# Patient Record
Sex: Male | Born: 1955
Health system: Southern US, Community
[De-identification: ages and names within clinical notes are randomized; demographics above are authoritative.]

## PROBLEM LIST (undated history)

## (undated) DIAGNOSIS — L03116 Cellulitis of left lower limb: Secondary | ICD-10-CM

## (undated) DIAGNOSIS — E119 Type 2 diabetes mellitus without complications: Secondary | ICD-10-CM

## (undated) DIAGNOSIS — L02416 Cutaneous abscess of left lower limb: Secondary | ICD-10-CM

## (undated) DIAGNOSIS — I509 Heart failure, unspecified: Secondary | ICD-10-CM

## (undated) DIAGNOSIS — I4891 Unspecified atrial fibrillation: Secondary | ICD-10-CM

## (undated) DIAGNOSIS — R0600 Dyspnea, unspecified: Secondary | ICD-10-CM

## (undated) HISTORY — PX: OTHER SURGICAL HISTORY: SHX169

---

## 2016-06-12 DIAGNOSIS — L02416 Cutaneous abscess of left lower limb: Secondary | ICD-10-CM

## 2016-06-12 DIAGNOSIS — L03116 Cellulitis of left lower limb: Secondary | ICD-10-CM

## 2016-06-12 HISTORY — DX: Cellulitis of left lower limb: L03.116

## 2016-06-12 HISTORY — DX: Cutaneous abscess of left lower limb: L02.416

## 2016-06-22 ENCOUNTER — Inpatient Hospital Stay (HOSPITAL_COMMUNITY): Payer: Self-pay

## 2016-06-22 ENCOUNTER — Inpatient Hospital Stay (HOSPITAL_COMMUNITY)
Admission: EM | Admit: 2016-06-22 | Discharge: 2016-06-27 | DRG: 617 | Disposition: A | Discharge status: Home / Self Care | Payer: Self-pay | Attending: Internal Medicine | Admitting: Internal Medicine

## 2016-06-22 ENCOUNTER — Encounter (HOSPITAL_COMMUNITY): Payer: Self-pay | Admitting: Emergency Medicine

## 2016-06-22 ENCOUNTER — Emergency Department (HOSPITAL_COMMUNITY): Payer: Self-pay

## 2016-06-22 DIAGNOSIS — E1152 Type 2 diabetes mellitus with diabetic peripheral angiopathy with gangrene: Secondary | ICD-10-CM | POA: Diagnosis present

## 2016-06-22 DIAGNOSIS — E11621 Type 2 diabetes mellitus with foot ulcer: Secondary | ICD-10-CM | POA: Diagnosis present

## 2016-06-22 DIAGNOSIS — Z9889 Other specified postprocedural states: Secondary | ICD-10-CM

## 2016-06-22 DIAGNOSIS — L03116 Cellulitis of left lower limb: Secondary | ICD-10-CM | POA: Diagnosis present

## 2016-06-22 DIAGNOSIS — Z833 Family history of diabetes mellitus: Secondary | ICD-10-CM

## 2016-06-22 DIAGNOSIS — E1169 Type 2 diabetes mellitus with other specified complication: Principal | ICD-10-CM | POA: Diagnosis present

## 2016-06-22 DIAGNOSIS — M869 Osteomyelitis, unspecified: Secondary | ICD-10-CM | POA: Diagnosis present

## 2016-06-22 DIAGNOSIS — R000 Tachycardia, unspecified: Secondary | ICD-10-CM | POA: Diagnosis present

## 2016-06-22 DIAGNOSIS — I96 Gangrene, not elsewhere classified: Secondary | ICD-10-CM | POA: Diagnosis present

## 2016-06-22 DIAGNOSIS — R2681 Unsteadiness on feet: Secondary | ICD-10-CM

## 2016-06-22 DIAGNOSIS — I10 Essential (primary) hypertension: Secondary | ICD-10-CM | POA: Diagnosis present

## 2016-06-22 DIAGNOSIS — E1140 Type 2 diabetes mellitus with diabetic neuropathy, unspecified: Secondary | ICD-10-CM | POA: Diagnosis present

## 2016-06-22 DIAGNOSIS — E1165 Type 2 diabetes mellitus with hyperglycemia: Secondary | ICD-10-CM | POA: Diagnosis present

## 2016-06-22 DIAGNOSIS — I481 Persistent atrial fibrillation: Secondary | ICD-10-CM | POA: Diagnosis present

## 2016-06-22 DIAGNOSIS — L97529 Non-pressure chronic ulcer of other part of left foot with unspecified severity: Secondary | ICD-10-CM | POA: Diagnosis present

## 2016-06-22 DIAGNOSIS — R2689 Other abnormalities of gait and mobility: Secondary | ICD-10-CM

## 2016-06-22 HISTORY — DX: Cellulitis of left lower limb: L03.116

## 2016-06-22 HISTORY — DX: Type 2 diabetes mellitus without complications: E11.9

## 2016-06-22 HISTORY — DX: Cutaneous abscess of left lower limb: L02.416

## 2016-06-22 LAB — CBC WITH DIFFERENTIAL/PLATELET
BASOS ABS: 0 10*3/uL (ref 0.0–0.1)
Basophils Relative: 0 %
EOS ABS: 0.1 10*3/uL (ref 0.0–0.7)
Eosinophils Relative: 1 %
HCT: 43.3 % (ref 39.0–52.0)
HEMOGLOBIN: 15.2 g/dL (ref 13.0–17.0)
LYMPHS ABS: 1.2 10*3/uL (ref 0.7–4.0)
LYMPHS PCT: 6 %
MCH: 31.5 pg (ref 26.0–34.0)
MCHC: 35.1 g/dL (ref 30.0–36.0)
MCV: 89.6 fL (ref 78.0–100.0)
Monocytes Absolute: 1.5 10*3/uL — ABNORMAL HIGH (ref 0.1–1.0)
Monocytes Relative: 8 %
NEUTROS PCT: 85 %
Neutro Abs: 15.4 10*3/uL — ABNORMAL HIGH (ref 1.7–7.7)
PLATELETS: 281 10*3/uL (ref 150–400)
RBC: 4.83 MIL/uL (ref 4.22–5.81)
RDW: 13 % (ref 11.5–15.5)
WBC: 18.2 10*3/uL — AB (ref 4.0–10.5)

## 2016-06-22 LAB — COMPREHENSIVE METABOLIC PANEL
ALBUMIN: 3.4 g/dL — AB (ref 3.5–5.0)
ALT: 13 U/L — ABNORMAL LOW (ref 17–63)
ANION GAP: 19 — AB (ref 5–15)
AST: 19 U/L (ref 15–41)
Alkaline Phosphatase: 67 U/L (ref 38–126)
BUN: 13 mg/dL (ref 6–20)
CO2: 17 mmol/L — AB (ref 22–32)
Calcium: 9.1 mg/dL (ref 8.9–10.3)
Chloride: 96 mmol/L — ABNORMAL LOW (ref 101–111)
Creatinine, Ser: 1 mg/dL (ref 0.61–1.24)
GFR calc Af Amer: >60 mL/min (ref >60)
GFR calc non Af Amer: >60 mL/min (ref >60)
GLUCOSE: 222 mg/dL — AB (ref 65–99)
POTASSIUM: 4.8 mmol/L (ref 3.5–5.1)
SODIUM: 132 mmol/L — AB (ref 135–145)
Total Bilirubin: 2.1 mg/dL — ABNORMAL HIGH (ref 0.3–1.2)
Total Protein: 8.1 g/dL (ref 6.5–8.1)

## 2016-06-22 LAB — I-STAT CG4 LACTIC ACID, ED
LACTIC ACID, VENOUS: 1.99 mmol/L — AB (ref 0.5–1.9)
Lactic Acid, Venous: 2.01 mmol/L (ref 0.5–1.9)

## 2016-06-22 LAB — CBG MONITORING, ED: GLUCOSE-CAPILLARY: 187 mg/dL — AB (ref 65–99)

## 2016-06-22 LAB — GLUCOSE, CAPILLARY: GLUCOSE-CAPILLARY: 189 mg/dL — AB (ref 65–99)

## 2016-06-22 MED ORDER — VANCOMYCIN HCL IN DEXTROSE 1-5 GM/200ML-% IV SOLN: 1000.0000 mg | Freq: Once | INTRAVENOUS | Status: DC

## 2016-06-22 MED ORDER — VANCOMYCIN HCL 10 G IV SOLR
2000.0000 mg | Freq: Once | INTRAVENOUS | Status: AC
  Administered 2016-06-22: 2000 mg via INTRAVENOUS
  Filled 2016-06-22: qty 2000

## 2016-06-22 MED ORDER — ACETAMINOPHEN 325 MG PO TABS
650.0000 mg | ORAL_TABLET | Freq: Four times a day (QID) | ORAL | Status: DC | PRN
  Filled 2016-06-22: qty 2

## 2016-06-22 MED ORDER — ONDANSETRON HCL 4 MG/2ML IJ SOLN: 4.0000 mg | Freq: Four times a day (QID) | INTRAMUSCULAR | Status: DC | PRN

## 2016-06-22 MED ORDER — VANCOMYCIN HCL IN DEXTROSE 1-5 GM/200ML-% IV SOLN
1000.0000 mg | Freq: Two times a day (BID) | INTRAVENOUS | Status: DC
  Administered 2016-06-23 – 2016-06-27 (×9): 1000 mg via INTRAVENOUS
  Filled 2016-06-22 (×10): qty 200

## 2016-06-22 MED ORDER — VANCOMYCIN HCL 10 G IV SOLR: 1250.0000 mg | Freq: Two times a day (BID) | INTRAVENOUS | Status: DC

## 2016-06-22 MED ORDER — SODIUM CHLORIDE 0.9 % IV SOLN
INTRAVENOUS | Status: AC
  Administered 2016-06-23: 03:00:00 via INTRAVENOUS

## 2016-06-22 MED ORDER — PIPERACILLIN-TAZOBACTAM 3.375 G IVPB
3.3750 g | Freq: Three times a day (TID) | INTRAVENOUS | Status: DC
  Administered 2016-06-23 – 2016-06-27 (×13): 3.375 g via INTRAVENOUS
  Filled 2016-06-22 (×15): qty 50

## 2016-06-22 MED ORDER — ONDANSETRON HCL 4 MG PO TABS: 4.0000 mg | ORAL_TABLET | Freq: Four times a day (QID) | ORAL | Status: DC | PRN

## 2016-06-22 MED ORDER — ACETAMINOPHEN 650 MG RE SUPP: 650.0000 mg | Freq: Four times a day (QID) | RECTAL | Status: DC | PRN

## 2016-06-22 MED ORDER — INSULIN ASPART 100 UNIT/ML ~~LOC~~ SOLN
0.0000 [IU] | Freq: Three times a day (TID) | SUBCUTANEOUS | Status: DC
  Administered 2016-06-23: 2 [IU] via SUBCUTANEOUS
  Administered 2016-06-23: 3 [IU] via SUBCUTANEOUS
  Administered 2016-06-23 – 2016-06-24 (×2): 2 [IU] via SUBCUTANEOUS
  Administered 2016-06-25: 3 [IU] via SUBCUTANEOUS
  Administered 2016-06-25: 2 [IU] via SUBCUTANEOUS
  Administered 2016-06-25 – 2016-06-26 (×3): 3 [IU] via SUBCUTANEOUS
  Administered 2016-06-26 – 2016-06-27 (×2): 2 [IU] via SUBCUTANEOUS

## 2016-06-22 MED ORDER — SODIUM CHLORIDE 0.9 % IV BOLUS (SEPSIS)
2000.0000 mL | Freq: Once | INTRAVENOUS | Status: AC
  Administered 2016-06-23: 2000 mL via INTRAVENOUS

## 2016-06-22 MED ORDER — PIPERACILLIN-TAZOBACTAM 3.375 G IVPB 30 MIN
3.3750 g | Freq: Once | INTRAVENOUS | Status: AC
  Administered 2016-06-22: 3.375 g via INTRAVENOUS
  Filled 2016-06-22: qty 50

## 2016-06-22 MED ORDER — SODIUM CHLORIDE 0.9 % IV SOLN: Freq: Once | INTRAVENOUS | Status: AC

## 2016-06-22 MED ORDER — INSULIN GLARGINE 100 UNIT/ML ~~LOC~~ SOLN
10.0000 [IU] | Freq: Every day | SUBCUTANEOUS | Status: DC
  Administered 2016-06-22 – 2016-06-24 (×3): 10 [IU] via SUBCUTANEOUS
  Filled 2016-06-22 (×4): qty 0.1

## 2016-06-22 NOTE — Progress Notes (Signed)
Pharmacy Antibiotic Note  Paul GamesKenneth W Mclaughlin is a 60 y.o. male admitted on 06/22/2016 with cellulitis.  Pharmacy has been consulted for Zosyn and vancomycin dosing. Afebrile, WBC elevated at 18.2. SCr 1, normalized CrCl ~4880ml/min.  Plan: Continue vancomycin 1g IV Q12 Continue Zosyn 3.375 gm IV q8h (4 hour infusion) Monitor clinical picture, renal function, VT prn F/U C&S, abx deescalation / LOT   Height: 6\' 1"  (185.4 cm) Weight: 278 lb (126.1 kg) IBW/kg (Calculated) : 79.9  Temp (24hrs), Avg:99.5 F (37.5 C), Min:99.1 F (37.3 C), Max:99.9 F (37.7 C)   Recent Labs Lab 06/22/16 1423 06/22/16 1457 06/22/16 1846  WBC 18.2*  --   --   CREATININE 1.00  --   --   LATICACIDVEN  --  2.01* 1.99*    Estimated Creatinine Clearance: 110.7 mL/min (by C-G formula based on SCr of 1 mg/dL).    Allergies not on file  Antimicrobials this admission: Zosyn 10/11 >>  Vancomycin 10/11 >>   Dose adjustments this admission: N/a   Microbiology results: 10/11 BCx: sent 10/11 UCx: sent   Thank you for allowing pharmacy to be a part of this patient's care.  Enzo BiNathan Rolande Moe, PharmD, BCPS Clinical Pharmacist Pager [phone] 06/22/2016 8:06 PM

## 2016-06-22 NOTE — ED Notes (Signed)
Patient transported to MRI at this time via ED stretcher. Pt in no apparent distress at this time.  

## 2016-06-22 NOTE — ED Provider Notes (Signed)
The patient is a 60 year old male, he was unaware that he had any medical problems as of approximately 3 days ago when he went to his doctor's office and was finally diagnosed with diabetes. He reports that he has had several days of worsening redness and swelling on the left foot, this is on the lateral aspect of the foot initially but has spread and has now significantly succumbed the entire foot as well as spreading up the leg with swelling of the leg and drainage from an open wound over the dorsal lateral aspect of the foot. On exam the patient has tenderness to palpation, he has a serous drainage from this open wound, the leg is red, hot, swollen, it does not extend above the knee. He is mildly tachycardic, he has a significant leukocytosis of 18,000, he has been taking Rocephin shots at the office, he was then placed on Cipro and Flagyl but because he did not improve he was sent to the hospital. At this time it would seem prudent to treat the patient for MRSA, he will receive vancomycin and Zosyn and be placed in the hospital. The patient does not appear to be in shock but does meet criteria for sepsis.  I saw and evaluated the patient, reviewed the resident's note and I agree with the findings and plan.   Final diagnoses:  Cellulitis of left foot      Eber Hong, MD 06/25/16 [phone]

## 2016-06-22 NOTE — ED Triage Notes (Addendum)
Left foot is red and swollen- [facility]-- redness extended past perimeter drawn at office. Little toe pale, draining at present,  Sent from office today

## 2016-06-22 NOTE — H&P (Signed)
History and Physical    Paul Mclaughlin ZOX:[id number] DOB: Sep 05, 1956 DOA: 06/22/2016  PCP: Pcp Not In System  Patient coming from: Home   Chief Complaint: Left foot swelling and erythema   HPI: Paul Mclaughlin is a 60 y.o. male recently diagnosed with diabetes mellitus. He noticed a blister on his left foot, small toe a couple of weeks ago, which gradually worsened. Last weekend around 6 days ago he noticed discoloration and last Monday (3 days ago) he went to his physicians office. At his physicians office he was noted to have a hemoglobin A1C of 10.1. He was started on an antibiotic for left foot cellulitis and metformin for diabetes mellitus. Despite, taking this antibiotic his foot swelling and erythema started moving proximally and there was discharge from the blister. On presentation to the ER patient was tachycardic and mildly febrile with elevate lactate. Xray doesn't show any bone involvment. Patient is being admitted for cellulitis with possible developing sepsis. On exam patient has erythema and swelling extending from the toes to the ankle joint. Patient has an ulceration of the dorsal aspect of the left foot small toe with discharge and mild sloughing. While in the ED, patient was given fluid bolus started on vancomycin and zosyn after blood cultures were obtained.    ED Course: Sodium 132, glucose 222, lactic acid 1.99, WBC 18.2 While in the ED patient was given fluid bolus started on vancomycin and zosyn, blood cultures pending.   Review of Systems: As per HPI, rest all negative.   Past Medical History:  Diagnosis Date  . Diabetes [facility])     Past Surgical History:  Procedure Laterality Date  . open heart surgery     as a child     reports that he has never smoked. He has never used smokeless tobacco. He reports that he uses drugs, including Marijuana. He reports that he does not drink alcohol.  Allergies not on file  Family History:   Brother and mother had diabetes.    Prior to Admission medications   Not on File    Physical Exam: Vitals:   06/22/16 1845 06/22/16 1900 06/22/16 1915 06/22/16 1930  BP: 126/59 142/78 149/83 132/75  Pulse: 89 86 94 90  Resp:    18  Temp:      TempSrc:      SpO2: 97% 99% 97% 97%  Weight:      Height:         Vitals:   06/22/16 1845 06/22/16 1900 06/22/16 1915 06/22/16 1930  BP: 126/59 142/78 149/83 132/75  Pulse: 89 86 94 90  Resp:    18  Temp:      TempSrc:      SpO2: 97% 99% 97% 97%  Weight:      Height:       Constitutional: Appears well built and nourished, not in distress  Eyes: Anicteric, no pallor  ENMT: No discharge from the ears, eyes, nose, and mouth  Neck: No JVD, no neck rigidity Respiratory: CTA, no wheezes or rhonchi Cardiovascular: No murmurs, rubs, or gallops  Abdomen: Soft, non tender, and non distended  Musculoskeletal: LE swelling more left than the right, erythema of left ideation which pt states is chronic  Skin: Erythema of left foot with  edema extending from toes to ankle pt also has an ulceration of the dorsal aspect of the left foot small toe with discharge sloughing.  Neurological: CN 2-12 grossly intact Psychiatric: Judgement and insight intact, oriented x3.  Labs on Admission: I have personally reviewed following labs and imaging studies  CBC:  Recent Labs Lab 06/22/16 1423  WBC 18.2*  NEUTROABS 15.4*  HGB 15.2  HCT 43.3  MCV 89.6  PLT 281   Basic Metabolic Panel:  Recent Labs Lab 06/22/16 1423  NA 132*  K 4.8  CL 96*  CO2 17*  GLUCOSE 222*  BUN 13  CREATININE 1.00  CALCIUM 9.1   GFR: Estimated Creatinine Clearance: 110.7 mL/min (by C-G formula based on SCr of 1 mg/dL). Liver Function Tests:  Recent Labs Lab 06/22/16 1423  AST 19  ALT 13*  ALKPHOS 67  BILITOT 2.1*  PROT 8.1  ALBUMIN 3.4*   No results for input(s): LIPASE, AMYLASE in the last 168 hours. No results for input(s): AMMONIA in the last 168 hours. Coagulation Profile: No  results for input(s): INR, PROTIME in the last 168 hours. Cardiac Enzymes: No results for input(s): CKTOTAL, CKMB, CKMBINDEX, TROPONINI in the last 168 hours. BNP (last 3 results) No results for input(s): PROBNP in the last 8760 hours. HbA1C: No results for input(s): HGBA1C in the last 72 hours. CBG: No results for input(s): GLUCAP in the last 168 hours. Lipid Profile: No results for input(s): CHOL, HDL, LDLCALC, TRIG, CHOLHDL, LDLDIRECT in the last 72 hours. Thyroid Function Tests: No results for input(s): TSH, T4TOTAL, FREET4, T3FREE, THYROIDAB in the last 72 hours. Anemia Panel: No results for input(s): VITAMINB12, FOLATE, FERRITIN, TIBC, IRON, RETICCTPCT in the last 72 hours. Urine analysis: No results found for: COLORURINE, APPEARANCEUR, LABSPEC, PHURINE, GLUCOSEU, HGBUR, BILIRUBINUR, KETONESUR, PROTEINUR, UROBILINOGEN, NITRITE, LEUKOCYTESUR Sepsis Labs: @LABRCNTIP (procalcitonin:4,lacticidven:4) )No results found for this or any previous visit (from the past 240 hour(s)).   Radiological Exams on Admission: Dg Chest 2 View  Result Date: 06/22/2016 CLINICAL DATA:  Left toe wound. EXAM: CHEST  2 VIEW COMPARISON:  None. FINDINGS: The heart size and mediastinal contours are within normal limits. Both lungs are clear. No pneumothorax or pleural effusion is noted. Old left clavicular fracture is noted. IMPRESSION: No active cardiopulmonary disease. Electronically Signed   By: Lupita Raider, M.D.   On: 06/22/2016 15:09   Dg Foot Complete Left  Result Date: 06/22/2016 CLINICAL DATA:  Left foot wound. EXAM: LEFT FOOT - COMPLETE 3+ VIEW COMPARISON:  None. FINDINGS: There is no evidence of fracture or dislocation. There is no evidence of arthropathy. Moderate spurring of posterior calcaneus is noted. Soft tissues are unremarkable. IMPRESSION: Moderate posterior calcaneal spurring. No acute abnormality seen in the left foot. Electronically Signed   By: Lupita Raider, M.D.   On: 06/22/2016  15:12    EKG: Independently reviewed.  Assessment/Plan Active Problems:   Cellulitis of left foot   1. Cellulitis left foot with possible developing sepsis - For now will continue with vancomycin and zosyn with agressive hydration. Check blood cultures and follow lactic acid. I have ordered an MRI of the left foot to check for abscess or bone involvement. Keep left leg raised. No signs of compartment syndrome.  2. DM type 2 with hyperglycemia - Patients anion gap is elevated, will recheck metabolic panel, if it remains elevated will need insulin infusion. Otherwise, I have placed the patient on 10 Units of lantus and sliding scale insulin. Hemoglobin A1c 4 days ago was 10.1. Holding metformin while inpatient.    DVT prophylaxis: SCDs Code Status: Full  Family Communication: Discussed with patient   Disposition Plan: Home once improved  Consults called: None  Admission status: Inpatient likely  for 2 days    Queen SloughArshad Karkandy, MD  [facility] If 7PM-7AM, please contact night-coverage [email or web address] Password [email or web address]  06/22/2016, 8:30 PM     By signing my name below, I, Cynda AcresHailei Fulton, attest that this documentation has been prepared under the direction and in the presence of Midge MiniumArshad Fardowsa Authier, MD. Electronically signed: Cynda AcresHailei Fulton, Scribe. 06/22/16

## 2016-06-22 NOTE — ED Provider Notes (Signed)
[facility]-EMERGENCY DEPT Provider Note   CSN: [id number] Arrival date & time: 06/22/16  1353     History   Chief Complaint Chief Complaint  Patient presents with  . Cellulitis    HPI Paul Mclaughlin is a 60 y.o. male.   Rash   This is a new problem. The current episode started more than 2 days ago. The problem has been gradually worsening. There has been no fever. The rash is present on the left foot and left toes. The pain is moderate. Associated symptoms include pain and weeping. Treatments tried: antibiotics (rocephin, flagyl) The treatment provided no relief.    Past Medical History:  Diagnosis Date  . Diabetes (HCC)     There are no active problems to display for this patient.   Past Surgical History:  Procedure Laterality Date  . open heart surgery     as a child       Home Medications    Prior to Admission medications   Not on File    Family History No family history on file.  Social History Social History  Substance Use Topics  . Smoking status: Never Smoker  . Smokeless tobacco: Never Used  . Alcohol use No     Allergies   Review of patient's allergies indicates not on file.   Review of Systems Review of Systems  Constitutional: Negative for chills and fever.  HENT: Negative for ear pain and sore throat.   Eyes: Negative for pain and visual disturbance.  Respiratory: Negative for cough and shortness of breath.   Cardiovascular: Negative for chest pain and palpitations.  Gastrointestinal: Negative for abdominal pain and vomiting.  Genitourinary: Negative for dysuria and hematuria.  Musculoskeletal: Negative for arthralgias and back pain.  Skin: Positive for rash. Negative for color change.  Neurological: Negative for seizures and syncope.  All other systems reviewed and are negative.    Physical Exam Updated Vital Signs BP 164/86   Pulse 86   Temp 99.1 F (37.3 C) (Oral)   Resp 16   Ht 6\' 1"  (1.854 m)   Wt 126.1 kg   SpO2 99%    BMI 36.68 kg/m   Physical Exam  Constitutional: He is oriented to person, place, and time. He appears well-developed and well-nourished.  HENT:  Head: Normocephalic and atraumatic.  Eyes: Conjunctivae and EOM are normal. Pupils are equal, round, and reactive to light.  Neck: Normal range of motion. Neck supple.  Cardiovascular: Normal rate and regular rhythm.   Pulmonary/Chest: Effort normal and breath sounds normal. No respiratory distress.  Abdominal: Soft. There is no tenderness.  Musculoskeletal: He exhibits no edema.  Neurological: He is alert and oriented to person, place, and time.  Skin: Skin is warm and dry.  Erythema and warmth to the left foot. Area of cellulitis expands beyond the border outlined by PCP.  Open ulcer near 1st digit.  Psychiatric: He has a normal mood and affect.  Nursing note and vitals reviewed.    ED Treatments / Results  Labs (all labs ordered are listed, but only abnormal results are displayed) Labs Reviewed  COMPREHENSIVE METABOLIC PANEL - Abnormal; Notable for the following:       Result Value   Sodium 132 (*)    Chloride 96 (*)    CO2 17 (*)    Glucose, Bld 222 (*)    Albumin 3.4 (*)    ALT 13 (*)    Total Bilirubin 2.1 (*)    Anion gap 19 (*)  All other components within normal limits  CBC WITH DIFFERENTIAL/PLATELET - Abnormal; Notable for the following:    WBC 18.2 (*)    Neutro Abs 15.4 (*)    Monocytes Absolute 1.5 (*)    All other components within normal limits  I-STAT CG4 LACTIC ACID, ED - Abnormal; Notable for the following:    Lactic Acid, Venous 2.01 (*)    All other components within normal limits  CULTURE, BLOOD (ROUTINE X 2)  CULTURE, BLOOD (ROUTINE X 2)  URINE CULTURE  URINALYSIS, ROUTINE W REFLEX MICROSCOPIC (NOT AT [facility])  I-STAT CG4 LACTIC ACID, ED    EKG  EKG Interpretation None       Radiology Dg Chest 2 View  Result Date: 06/22/2016 CLINICAL DATA:  Left toe wound. EXAM: CHEST  2 VIEW COMPARISON:   None. FINDINGS: The heart size and mediastinal contours are within normal limits. Both lungs are clear. No pneumothorax or pleural effusion is noted. Old left clavicular fracture is noted. IMPRESSION: No active cardiopulmonary disease. Electronically Signed   By: Lupita Raider, M.D.   On: 06/22/2016 15:09   Dg Foot Complete Left  Result Date: 06/22/2016 CLINICAL DATA:  Left foot wound. EXAM: LEFT FOOT - COMPLETE 3+ VIEW COMPARISON:  None. FINDINGS: There is no evidence of fracture or dislocation. There is no evidence of arthropathy. Moderate spurring of posterior calcaneus is noted. Soft tissues are unremarkable. IMPRESSION: Moderate posterior calcaneal spurring. No acute abnormality seen in the left foot. Electronically Signed   By: Lupita Raider, M.D.   On: 06/22/2016 15:12    Procedures Procedures (including critical care time)  Medications Ordered in ED Medications - No data to display   Initial Impression / Assessment and Plan / ED Course  I have reviewed the triage vital signs and the nursing notes.  Pertinent labs & imaging results that were available during my care of the patient were reviewed by me and considered in my medical decision making (see chart for details).  Clinical Course   Paul Mclaughlin is a 60 year old male with recently diagnosed diabetes and left foot cellulitis.  He has been see each of the past 3 days by his PCP.  He was given rocephin injections yesterday and the day before, but none today.  His cellulitis has expanded beyond the borders outlined by the PCP.  Given his treatment failure, vancomycin and zosyn were started.  Labs ordered including CBC, CMP, blood cultures, lactic acid. Results significant for leukocytosis, elevated glucose, and mild lactic acidosis.  Imaging ordered, including CXR and left foot Xr. Personally reviewed by me. Demonstrates no acute abnormalities.  Patient is admitted for IV antibiotics and further evaluation of the foot  infection.  Final Clinical Impressions(s) / ED Diagnoses   Final diagnoses:  Cellulitis of left foot    New Prescriptions New Prescriptions   No medications on file     Garey Ham, MD 06/23/16 0100    Eber Hong, MD 06/25/16 [phone]

## 2016-06-23 ENCOUNTER — Encounter (HOSPITAL_COMMUNITY): Payer: Self-pay | Admitting: General Practice

## 2016-06-23 LAB — BASIC METABOLIC PANEL
ANION GAP: 8 (ref 5–15)
BUN: 16 mg/dL (ref 6–20)
CALCIUM: 8.4 mg/dL — AB (ref 8.9–10.3)
CHLORIDE: 104 mmol/L (ref 101–111)
CO2: 24 mmol/L (ref 22–32)
CREATININE: 0.9 mg/dL (ref 0.61–1.24)
GFR calc Af Amer: >60 mL/min (ref >60)
GFR calc non Af Amer: >60 mL/min (ref >60)
GLUCOSE: 191 mg/dL — AB (ref 65–99)
Potassium: 3.7 mmol/L (ref 3.5–5.1)
Sodium: 136 mmol/L (ref 135–145)

## 2016-06-23 LAB — CBC
HCT: 36.1 % — ABNORMAL LOW (ref 39.0–52.0)
HEMOGLOBIN: 12.7 g/dL — AB (ref 13.0–17.0)
MCH: 31.1 pg (ref 26.0–34.0)
MCHC: 35.2 g/dL (ref 30.0–36.0)
MCV: 88.3 fL (ref 78.0–100.0)
PLATELETS: 250 10*3/uL (ref 150–400)
RBC: 4.09 MIL/uL — AB (ref 4.22–5.81)
RDW: 13 % (ref 11.5–15.5)
WBC: 12.3 10*3/uL — AB (ref 4.0–10.5)

## 2016-06-23 LAB — GLUCOSE, CAPILLARY
GLUCOSE-CAPILLARY: 213 mg/dL — AB (ref 65–99)
GLUCOSE-CAPILLARY: 194 mg/dL — AB (ref 65–99)
Glucose-Capillary: 187 mg/dL — ABNORMAL HIGH (ref 65–99)
Glucose-Capillary: 225 mg/dL — ABNORMAL HIGH (ref 65–99)

## 2016-06-23 LAB — LACTIC ACID, PLASMA: Lactic Acid, Venous: 0.9 mmol/L (ref 0.5–1.9)

## 2016-06-23 MED ORDER — ENOXAPARIN SODIUM 40 MG/0.4ML ~~LOC~~ SOLN
40.0000 mg | SUBCUTANEOUS | Status: DC
  Administered 2016-06-23 – 2016-06-24 (×2): 40 mg via SUBCUTANEOUS
  Filled 2016-06-23 (×3): qty 0.4

## 2016-06-23 NOTE — Progress Notes (Signed)
Dr Lajoyce Cornersduda will see today

## 2016-06-23 NOTE — Progress Notes (Signed)
Inpatient Diabetes Program Recommendations  AACE/ADA: New Consensus Statement on Inpatient Glycemic Control (2015)  Target Ranges:  Prepandial:   less than 140 mg/dL      Peak postprandial:   less than 180 mg/dL (1-2 hours)      Critically ill patients:  140 - 180 mg/dL   Lab Results  Component Value Date   GLUCAP 187 (H) 06/23/2016    Review of Glycemic Control  Results for Agapito GamesSWING, Mortimer W (MRN [id number]) as of 06/23/2016 09:33  Ref. Range 06/22/2016 22:30 06/22/2016 23:16 06/23/2016 06:49  Glucose-Capillary Latest Ref Range: 65 - 99 mg/dL 409187 (H) 811189 (H) 914187 (H)    Diabetes history: Recent diagnosis- A1C 10.1% Outpatient Diabetes medications: Metformin 500mg  bid Current orders for Inpatient glycemic control: Lantus 10 units qhs, Novolog 0-9 units tid  Inpatient Diabetes Program Recommendations:  Consider increasing Lantus to 13 units qhs (0.1 unit/kg)  Please consider d/c patient home on insulin - A1C 10.1%.  If insulin is started I would recommend [employer] Reli-on insulin N insulin 7 units bid and increase Metformin to 1000mg  bid.  Susette RacerJulie Eashan Schipani, RN, BA, MHA, CDE Diabetes Coordinator Inpatient Diabetes Program  [phone] (Team Pager) [phone] [facility] Office) 06/23/2016 9:41 AM

## 2016-06-23 NOTE — Progress Notes (Signed)
PROGRESS NOTE    Paul Mclaughlin  GNF:[id number] DOB: 1955-12-24 DOA: 06/22/2016 PCP: Pcp Not In System   Brief Narrative: Paul Mclaughlin is a 60 y.o. male recently diagnosed with diabetes mellitus. He noticed a blister on his left foot, small toe a couple of weeks ago, which gradually worsened. Last weekend around 6 days ago he noticed discoloration and last Monday (3 days ago) he went to his physicians office. At his physicians office he was noted to have a hemoglobin A1C of 10.1. He was started on an antibiotic for left foot cellulitis and metformin for diabetes mellitus. Despite, taking this antibiotic his foot swelling and erythema started moving proximally and there was discharge from the blister. On presentation to the ER patient was tachycardic and mildly febrile with elevate lactate. Xray doesn't show any bone involvment. Patient is being admitted for cellulitis with possible developing sepsis. On exam patient has erythema and swelling extending from the toes to the ankle joint. Patient has an ulceration of the dorsal aspect of the left foot small toe with discharge and mild sloughing. While in the ED, patient was given fluid bolus started on vancomycin and zosyn after blood cultures were obtained.    ED Course: Sodium 132, glucose 222, lactic acid 1.99, WBC 18.2 While in the ED patient was given fluid bolus started on vancomycin and zosyn, blood cultures pending   Assessment & Plan:   Principal Problem:   Cellulitis of left foot Active Problems:   Controlled type 2 diabetes mellitus with hyperglycemia (HCC)   1-Left foot cellulitis, small toe phalanges with possible Osteomyelitis , abscess.  MRI Small Toe Phalanges suspicious for Osteomyelitis Small abscess or phlegmon dorsally in the small toe. Diffuse confluent edema along the ankle and tracking along the dorsum of the foot and along the ball of the foot, suspicious for possible cellulitis. Continue with IV Vancomycin and Zosyn.    Ortho consulted.  WBC trending down 18---12.  2-DM, HbA1c at 10. \ Continue with lantus.  He will probably need insulin at discharge/.    DVT prophylaxis: lovenox Code Status: full code.  Family Communication: care discussed with patient  Disposition Plan: remain inpatient for treatment of foot infection   Consultants:   ortho   Procedures:     Antimicrobials:   Vancomycin , zosyn      Subjective: He is feeling well, redness left foot is the same   Objective: Vitals:   06/22/16 2030 06/22/16 2045 06/22/16 2314 06/23/16 0545  BP: 117/60 124/60 (!) 156/69 127/68  Pulse: 83 86 88 92  Resp: 21 21 20 18   Temp:   97.8 F (36.6 C) 97.1 F (36.2 C)  TempSrc:   Axillary Oral  SpO2: 96% 95% 97% 97%  Weight:      Height:       No intake or output data in the 24 hours ending 06/23/16 1103 Filed Weights   06/22/16 1420  Weight: 126.1 kg (278 lb)    Examination:  General exam: Appears calm and comfortable  Respiratory system: Clear to auscultation. Respiratory effort normal. Cardiovascular system: S1 & S2 heard, RRR. No JVD, murmurs, rubs, gallops or clicks. No pedal edema. Gastrointestinal system: Abdomen is nondistended, soft and nontender. No organomegaly or masses felt. Normal bowel sounds heard. Central nervous system: Alert and oriented. No focal neurological deficits. Extremities: Symmetric 5 x 5 power. Skin: left LE foot with redness, small toe with edema, redness, whitish coloration.  Psychiatry: Judgement and insight appear normal. Mood &  affect appropriate.     Data Reviewed: I have personally reviewed following labs and imaging studies  CBC:  Recent Labs Lab 06/22/16 1423 06/23/16 0350  WBC 18.2* 12.3*  NEUTROABS 15.4*  --   HGB 15.2 12.7*  HCT 43.3 36.1*  MCV 89.6 88.3  PLT 281 250   Basic Metabolic Panel:  Recent Labs Lab 06/22/16 1423 06/23/16 0350  NA 132* 136  K 4.8 3.7  CL 96* 104  CO2 17* 24  GLUCOSE 222* 191*  BUN 13  16  CREATININE 1.00 0.90  CALCIUM 9.1 8.4*   GFR: Estimated Creatinine Clearance: 123 mL/min (by C-G formula based on SCr of 0.9 mg/dL). Liver Function Tests:  Recent Labs Lab 06/22/16 1423  AST 19  ALT 13*  ALKPHOS 67  BILITOT 2.1*  PROT 8.1  ALBUMIN 3.4*   No results for input(s): LIPASE, AMYLASE in the last 168 hours. No results for input(s): AMMONIA in the last 168 hours. Coagulation Profile: No results for input(s): INR, PROTIME in the last 168 hours. Cardiac Enzymes: No results for input(s): CKTOTAL, CKMB, CKMBINDEX, TROPONINI in the last 168 hours. BNP (last 3 results) No results for input(s): PROBNP in the last 8760 hours. HbA1C: No results for input(s): HGBA1C in the last 72 hours. CBG:  Recent Labs Lab 06/22/16 2230 06/22/16 2316 06/23/16 0649  GLUCAP 187* 189* 187*   Lipid Profile: No results for input(s): CHOL, HDL, LDLCALC, TRIG, CHOLHDL, LDLDIRECT in the last 72 hours. Thyroid Function Tests: No results for input(s): TSH, T4TOTAL, FREET4, T3FREE, THYROIDAB in the last 72 hours. Anemia Panel: No results for input(s): VITAMINB12, FOLATE, FERRITIN, TIBC, IRON, RETICCTPCT in the last 72 hours. Sepsis Labs:  Recent Labs Lab 06/22/16 1457 06/22/16 1846 06/23/16 0350  LATICACIDVEN 2.01* 1.99* 0.9    No results found for this or any previous visit (from the past 240 hour(s)).       Radiology Studies: Dg Chest 2 View  Result Date: 06/22/2016 CLINICAL DATA:  Left toe wound. EXAM: CHEST  2 VIEW COMPARISON:  None. FINDINGS: The heart size and mediastinal contours are within normal limits. Both lungs are clear. No pneumothorax or pleural effusion is noted. Old left clavicular fracture is noted. IMPRESSION: No active cardiopulmonary disease. Electronically Signed   By: Lupita Raider, M.D.   On: 06/22/2016 15:09   Mr Foot Left Wo Contrast  Result Date: 06/23/2016 CLINICAL DATA:  Swallow left foot. Blistering. Assessment for osteomyelitis. EXAM: MRI  OF THE LEFT FOOT WITHOUT CONTRAST TECHNIQUE: Multiplanar, multisequence MR imaging was performed. No intravenous contrast was administered. COMPARISON:  06/22/2016 FINDINGS: Osteomyelitis protocol MRI of the foot was obtained, to include the entire foot and ankle. This protocol uses a large field of view to cover the entire foot and ankle, and is suitable for assessing bony structures for osteomyelitis. Due to the large field of view and imaging plane choice, this protocol is less sensitive for assessing small structures such as ligamentous structures of the foot and ankle, compared to a dedicated forefoot or dedicated hindfoot exam. Diffuse confluent edema is noted along the ankle, especially medially, and tracking in the dorsum of the foot into the toes. There is edema in the plantar musculature of the foot and a lesser amount of edema in the distal plantar foot subcutaneous tissues. This appearance could certainly represent cellulitis. Abnormal and thickened medial band of the plantar fascia compatible with plantar fasciitis or partial tear. Plantar and Achilles calcaneal spurs of the calcaneus noted. There is  abnormal edema signal in the phalanges of the small toe suspicious for osteomyelitis in this setting. Dorsally in the small toe there is a 3.7 by 1.4 by 0.8 cm (volume = 2 cm^3) high T2 signal region suspicious for abscess or phlegmon. The Degenerative findings along the Lisfranc joint. IMPRESSION: 1. Abnormal marrow edema in the small toe phalanges suspicious for osteomyelitis. Small abscess or phlegmon dorsally in the small toe. 2. Diffuse confluent edema along the ankle and tracking along the dorsum of the foot and along the ball of the foot, suspicious for possible cellulitis. 3. Edema at the plantar musculature of the foot, probably from myositis. The gas is identified tracking in the soft tissues of the left foot. Electronically Signed   By: Gaylyn Rong M.D.   On: 06/23/2016 07:58   Dg Foot  Complete Left  Result Date: 06/22/2016 CLINICAL DATA:  Left foot wound. EXAM: LEFT FOOT - COMPLETE 3+ VIEW COMPARISON:  None. FINDINGS: There is no evidence of fracture or dislocation. There is no evidence of arthropathy. Moderate spurring of posterior calcaneus is noted. Soft tissues are unremarkable. IMPRESSION: Moderate posterior calcaneal spurring. No acute abnormality seen in the left foot. Electronically Signed   By: Lupita Raider, M.D.   On: 06/22/2016 15:12        Scheduled Meds: . insulin aspart  0-9 Units Subcutaneous TID WC  . insulin glargine  10 Units Subcutaneous QHS  . piperacillin-tazobactam (ZOSYN)  IV  3.375 g Intravenous Q8H  . vancomycin  1,000 mg Intravenous Q12H   Continuous Infusions: . sodium chloride 125 mL/hr at 06/23/16 0246     LOS: 1 day    Time spent: 35 minutes.     Alba Cory, MD [facility] Pager [phone]  If 7PM-7AM, please contact night-coverage [email or web address] Password [email or web address] 06/23/2016, 11:03 AM

## 2016-06-23 NOTE — Progress Notes (Signed)
X-ray being taken this am, left foot and ankle red, swollen and inflamed, tendon to touch,elevated, prefers to leave sock off in bed, but will put on to go to BR, left containers in room for urinalysis and culture. Resting Comforability at this time

## 2016-06-24 ENCOUNTER — Inpatient Hospital Stay (HOSPITAL_COMMUNITY): Payer: Self-pay | Admitting: Anesthesiology

## 2016-06-24 ENCOUNTER — Encounter (HOSPITAL_COMMUNITY): Payer: Self-pay | Admitting: Anesthesiology

## 2016-06-24 ENCOUNTER — Encounter (HOSPITAL_COMMUNITY)
Admission: EM | Disposition: A | Discharge status: Home / Self Care | Payer: Self-pay | Source: Home / Self Care | Attending: Internal Medicine

## 2016-06-24 DIAGNOSIS — M86272 Subacute osteomyelitis, left ankle and foot: Secondary | ICD-10-CM

## 2016-06-24 DIAGNOSIS — I4891 Unspecified atrial fibrillation: Secondary | ICD-10-CM

## 2016-06-24 DIAGNOSIS — L02612 Cutaneous abscess of left foot: Secondary | ICD-10-CM

## 2016-06-24 DIAGNOSIS — E1142 Type 2 diabetes mellitus with diabetic polyneuropathy: Secondary | ICD-10-CM

## 2016-06-24 HISTORY — PX: AMPUTATION: SHX166

## 2016-06-24 LAB — MRSA PCR SCREENING: MRSA by PCR: NEGATIVE

## 2016-06-24 LAB — BASIC METABOLIC PANEL
ANION GAP: 7 (ref 5–15)
BUN: 6 mg/dL (ref 6–20)
CALCIUM: 7.4 mg/dL — AB (ref 8.9–10.3)
CHLORIDE: 109 mmol/L (ref 101–111)
CO2: 22 mmol/L (ref 22–32)
Creatinine, Ser: 0.66 mg/dL (ref 0.61–1.24)
GFR calc non Af Amer: >60 mL/min (ref >60)
Glucose, Bld: 168 mg/dL — ABNORMAL HIGH (ref 65–99)
Potassium: 3.4 mmol/L — ABNORMAL LOW (ref 3.5–5.1)
SODIUM: 138 mmol/L (ref 135–145)

## 2016-06-24 LAB — GLUCOSE, CAPILLARY
GLUCOSE-CAPILLARY: 177 mg/dL — AB (ref 65–99)
GLUCOSE-CAPILLARY: 200 mg/dL — AB (ref 65–99)
Glucose-Capillary: 161 mg/dL — ABNORMAL HIGH (ref 65–99)
Glucose-Capillary: 177 mg/dL — ABNORMAL HIGH (ref 65–99)
Glucose-Capillary: 237 mg/dL — ABNORMAL HIGH (ref 65–99)

## 2016-06-24 SURGERY — AMPUTATION, FOOT, PARTIAL
Anesthesia: Monitor Anesthesia Care | Laterality: Left

## 2016-06-24 MED ORDER — ROPIVACAINE HCL 5 MG/ML IJ SOLN
INTRAMUSCULAR | Status: DC | PRN
  Administered 2016-06-24: 30 mL via PERINEURAL

## 2016-06-24 MED ORDER — FENTANYL CITRATE (PF) 100 MCG/2ML IJ SOLN
INTRAMUSCULAR | Status: DC | PRN
  Administered 2016-06-24: 25 ug via INTRAVENOUS
  Administered 2016-06-24: 50 ug via INTRAVENOUS
  Administered 2016-06-24: 25 ug via INTRAVENOUS

## 2016-06-24 MED ORDER — ACETAMINOPHEN 650 MG RE SUPP: 650.0000 mg | Freq: Four times a day (QID) | RECTAL | Status: DC | PRN

## 2016-06-24 MED ORDER — ONDANSETRON HCL 4 MG/2ML IJ SOLN: 4.0000 mg | Freq: Four times a day (QID) | INTRAMUSCULAR | Status: DC | PRN

## 2016-06-24 MED ORDER — GLUCERNA SHAKE PO LIQD
237.0000 mL | Freq: Two times a day (BID) | ORAL | Status: DC
  Administered 2016-06-25: 237 mL via ORAL

## 2016-06-24 MED ORDER — OXYCODONE HCL 5 MG PO TABS
5.0000 mg | ORAL_TABLET | ORAL | Status: DC | PRN
  Administered 2016-06-25 – 2016-06-26 (×2): 10 mg via ORAL
  Filled 2016-06-24 (×3): qty 2

## 2016-06-24 MED ORDER — MIDAZOLAM HCL 2 MG/2ML IJ SOLN
INTRAMUSCULAR | Status: AC
  Filled 2016-06-24: qty 2

## 2016-06-24 MED ORDER — PROPOFOL 500 MG/50ML IV EMUL
INTRAVENOUS | Status: DC | PRN
  Administered 2016-06-24: 100 ug/kg/min via INTRAVENOUS

## 2016-06-24 MED ORDER — LIDOCAINE 2% (20 MG/ML) 5 ML SYRINGE
INTRAMUSCULAR | Status: AC
  Filled 2016-06-24: qty 5

## 2016-06-24 MED ORDER — SODIUM CHLORIDE 0.9 % IV SOLN
INTRAVENOUS | Status: DC
  Administered 2016-06-24: 19:00:00 via INTRAVENOUS

## 2016-06-24 MED ORDER — ONDANSETRON HCL 4 MG/2ML IJ SOLN
INTRAMUSCULAR | Status: AC
  Filled 2016-06-24: qty 2

## 2016-06-24 MED ORDER — FENTANYL CITRATE (PF) 100 MCG/2ML IJ SOLN
50.0000 ug | Freq: Once | INTRAMUSCULAR | Status: AC
  Administered 2016-06-24: 50 ug via INTRAVENOUS

## 2016-06-24 MED ORDER — PROPOFOL 500 MG/50ML IV EMUL: INTRAVENOUS | Status: DC | PRN

## 2016-06-24 MED ORDER — METOCLOPRAMIDE HCL 5 MG/ML IJ SOLN: 5.0000 mg | Freq: Three times a day (TID) | INTRAMUSCULAR | Status: DC | PRN

## 2016-06-24 MED ORDER — ONDANSETRON HCL 4 MG/2ML IJ SOLN
INTRAMUSCULAR | Status: DC | PRN
  Administered 2016-06-24: 4 mg via INTRAVENOUS

## 2016-06-24 MED ORDER — FENTANYL CITRATE (PF) 100 MCG/2ML IJ SOLN
INTRAMUSCULAR | Status: AC
  Filled 2016-06-24: qty 2

## 2016-06-24 MED ORDER — MIDAZOLAM HCL 2 MG/2ML IJ SOLN
INTRAMUSCULAR | Status: AC
Start: 2016-06-24 — End: 2016-06-25
  Filled 2016-06-24: qty 2

## 2016-06-24 MED ORDER — LIVING WELL WITH DIABETES BOOK
Freq: Once | Status: AC
  Administered 2016-06-24: 09:00:00
  Filled 2016-06-24: qty 1

## 2016-06-24 MED ORDER — PRO-STAT SUGAR FREE PO LIQD
30.0000 mL | Freq: Every day | ORAL | Status: DC
  Filled 2016-06-24 (×2): qty 30

## 2016-06-24 MED ORDER — SODIUM CHLORIDE 0.9 % IV SOLN
INTRAVENOUS | Status: DC | PRN
  Administered 2016-06-24: 15:00:00 via INTRAVENOUS

## 2016-06-24 MED ORDER — METHOCARBAMOL 1000 MG/10ML IJ SOLN
500.0000 mg | Freq: Four times a day (QID) | INTRAVENOUS | Status: DC | PRN
  Filled 2016-06-24: qty 5

## 2016-06-24 MED ORDER — CHLORHEXIDINE GLUCONATE 4 % EX LIQD
60.0000 mL | Freq: Once | CUTANEOUS | Status: AC
  Administered 2016-06-24: 4 via TOPICAL

## 2016-06-24 MED ORDER — HYDRALAZINE HCL 20 MG/ML IJ SOLN: 10.0000 mg | Freq: Three times a day (TID) | INTRAMUSCULAR | Status: DC | PRN

## 2016-06-24 MED ORDER — MIDAZOLAM HCL 2 MG/2ML IJ SOLN
2.0000 mg | Freq: Once | INTRAMUSCULAR | Status: AC
  Administered 2016-06-24: 2 mg via INTRAVENOUS

## 2016-06-24 MED ORDER — 0.9 % SODIUM CHLORIDE (POUR BTL) OPTIME
TOPICAL | Status: DC | PRN
  Administered 2016-06-24: 1000 mL

## 2016-06-24 MED ORDER — POVIDONE-IODINE 10 % EX SWAB: 2.0000 "application " | Freq: Once | CUTANEOUS | Status: DC

## 2016-06-24 MED ORDER — POTASSIUM CHLORIDE 10 MEQ/100ML IV SOLN
10.0000 meq | INTRAVENOUS | Status: AC
  Filled 2016-06-24 (×3): qty 100

## 2016-06-24 MED ORDER — METHOCARBAMOL 500 MG PO TABS
500.0000 mg | ORAL_TABLET | Freq: Four times a day (QID) | ORAL | Status: DC | PRN
  Administered 2016-06-25 – 2016-06-26 (×2): 500 mg via ORAL
  Filled 2016-06-24 (×2): qty 1

## 2016-06-24 MED ORDER — METOCLOPRAMIDE HCL 5 MG PO TABS: 5.0000 mg | ORAL_TABLET | Freq: Three times a day (TID) | ORAL | Status: DC | PRN

## 2016-06-24 MED ORDER — INSULIN STARTER KIT- SYRINGES (ENGLISH)
1.0000 | Freq: Once | Status: AC
  Administered 2016-06-24: 1
  Filled 2016-06-24: qty 1

## 2016-06-24 MED ORDER — HYDROMORPHONE HCL 1 MG/ML IJ SOLN: 1.0000 mg | INTRAMUSCULAR | Status: DC | PRN

## 2016-06-24 MED ORDER — ONDANSETRON HCL 4 MG PO TABS: 4.0000 mg | ORAL_TABLET | Freq: Four times a day (QID) | ORAL | Status: DC | PRN

## 2016-06-24 MED ORDER — ACETAMINOPHEN 325 MG PO TABS: 650.0000 mg | ORAL_TABLET | Freq: Four times a day (QID) | ORAL | Status: DC | PRN

## 2016-06-24 SURGICAL SUPPLY — 35 items
APL SKNCLS STERI-STRIP NONHPOA (GAUZE/BANDAGES/DRESSINGS)
BENZOIN TINCTURE PRP APPL 2/3 (GAUZE/BANDAGES/DRESSINGS) ×1 IMPLANT
BLADE SAW SGTL HD 18.5X60.5X1. (BLADE) ×1 IMPLANT
BLADE SURG 21 STRL SS (BLADE) ×1 IMPLANT
BNDG COHESIVE 4X5 TAN STRL (GAUZE/BANDAGES/DRESSINGS) ×2 IMPLANT
BNDG GAUZE ELAST 4 BULKY (GAUZE/BANDAGES/DRESSINGS) ×2 IMPLANT
COVER SURGICAL LIGHT HANDLE (MISCELLANEOUS) ×3 IMPLANT
DRAPE INCISE IOBAN 66X45 STRL (DRAPES) ×1 IMPLANT
DRAPE U-SHAPE 47X51 STRL (DRAPES) ×1 IMPLANT
DRSG ADAPTIC 3X8 NADH LF (GAUZE/BANDAGES/DRESSINGS) ×2 IMPLANT
DRSG PAD ABDOMINAL 8X10 ST (GAUZE/BANDAGES/DRESSINGS) ×2 IMPLANT
DURAPREP 26ML APPLICATOR (WOUND CARE) ×3 IMPLANT
ELECT REM PT RETURN 9FT ADLT (ELECTROSURGICAL) ×3
ELECTRODE REM PT RTRN 9FT ADLT (ELECTROSURGICAL) ×1 IMPLANT
GAUZE SPONGE 4X4 12PLY STRL (GAUZE/BANDAGES/DRESSINGS) ×2 IMPLANT
GLOVE BIOGEL PI IND STRL 9 (GLOVE) ×1 IMPLANT
GLOVE BIOGEL PI INDICATOR 9 (GLOVE) ×2
GLOVE SURG ORTHO 9.0 STRL STRW (GLOVE) ×3 IMPLANT
GOWN STRL REUS W/ TWL XL LVL3 (GOWN DISPOSABLE) ×3 IMPLANT
GOWN STRL REUS W/TWL XL LVL3 (GOWN DISPOSABLE) ×6
KIT BASIN OR (CUSTOM PROCEDURE TRAY) ×3 IMPLANT
KIT ROOM TURNOVER OR (KITS) ×3 IMPLANT
NS IRRIG 1000ML POUR BTL (IV SOLUTION) ×3 IMPLANT
PACK ORTHO EXTREMITY (CUSTOM PROCEDURE TRAY) ×3 IMPLANT
PAD ARMBOARD 7.5X6 YLW CONV (MISCELLANEOUS) ×4 IMPLANT
SPONGE GAUZE 4X4 12PLY STER LF (GAUZE/BANDAGES/DRESSINGS) ×2 IMPLANT
SPONGE LAP 18X18 X RAY DECT (DISPOSABLE) IMPLANT
SUT ETHILON 2 0 PSLX (SUTURE) ×4 IMPLANT
SUT VIC AB 2-0 CTB1 (SUTURE) IMPLANT
TOWEL OR 17X24 6PK STRL BLUE (TOWEL DISPOSABLE) ×3 IMPLANT
TOWEL OR 17X26 10 PK STRL BLUE (TOWEL DISPOSABLE) ×3 IMPLANT
TUBE CONNECTING 12'X1/4 (SUCTIONS) ×1
TUBE CONNECTING 12X1/4 (SUCTIONS) ×2 IMPLANT
WATER STERILE IRR 1000ML POUR (IV SOLUTION) ×1 IMPLANT
YANKAUER SUCT BULB TIP NO VENT (SUCTIONS) ×1 IMPLANT

## 2016-06-24 NOTE — Progress Notes (Signed)
Per CRNA, patient needs stat 12 lead EKG due to rhythm changes.  This was performed in PACU and rhythm reads Atrial-Fib with rate in 60's-70's.  Dr. Okey Dupreose was notified and will order a cardiology consult.  Patient denies any chest pain or feelings of arrhythmias.  Will continue to monitor.

## 2016-06-24 NOTE — Progress Notes (Signed)
Orthopedic Tech Progress Note Patient Details:  Agapito GamesKenneth W Sando Aug 02, 1956 [id number]  Ortho Devices Type of Ortho Device: Postop shoe/boot Ortho Device/Splint Location: Lt Foot Ortho Device/Splint Interventions: Ordered, Application   Clois Dupesvery S Talulah Schirmer 06/24/2016, 7:33 PM

## 2016-06-24 NOTE — Consult Note (Signed)
CARDIOLOGY CONSULT NOTE  Patient ID: Paul Mclaughlin MRN: [id number] DOB/AGE: [birth year] 59 y.o.  Admit date: 06/22/2016 Primary Physician   None Primary Cardiologist New Chief Complaint  Atrial fib Requesting  Dr. Okey Dupreose  HPI:  The patient was admitted with redness and swelling of his left foot.  This is in the face of newly diagnosed cellulitis and osteo.  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 We were called post op because he was in atrial fib.  However, after reviewing the chart and talking with anesthesia he has likely been in atrial fib the entire time.  The patient does not notice this rhythm.  He has not seen a doctor in years.  He reports that he was told that he had an irregular rhythm in 2005.  He thinks it was fib.  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  He is somewhat active and can do yard work and work on cars.  He does have increased DOE.  The patient denies any new symptoms such as chest discomfort, neck or arm discomfort. There has been no new PND or orthopnea. There have been no reported palpitations, presyncope or syncope.  Past Medical History:  Diagnosis Date  . Cellulitis and abscess of left leg 06/2016  . Diabetes [facility](HCC)     Past Surgical History:  Procedure Laterality Date  . open heart surgery     as a child    No Known Allergies Prescriptions Prior to Admission  Medication Sig Dispense Refill Last Dose  . ciprofloxacin (CIPRO) 750 MG tablet Take 750 mg by mouth daily.   06/22/2016 at 9am  . metFORMIN (GLUCOPHAGE) 500 MG tablet Take 500 mg by mouth 2 (two) times daily with a meal.   06/22/2016 at 9am  . metroNIDAZOLE (FLAGYL) 500 MG tablet Take 500 mg by mouth 3 (three) times daily.   06/22/2016 at 9am   Family History  Problem Relation Age of Onset  . Diabetes Mellitus II Mother   . Diabetes Mellitus II Brother     Social History   Social History  . Marital status: Legally Separated    Spouse name: N/A  . Number of  children: N/A  . Years of education: N/A   Occupational History  . Not on file.   Social History Main Topics  . Smoking status: Never Smoker  . Smokeless tobacco: Never Used  . Alcohol use No  . Drug use:     Types: Marijuana  . Sexual activity: Not on file   Other Topics Concern  . Not on file   Social History Narrative  . No narrative on file     ROS:    As stated in the HPI and negative for all other systems.  Physical Exam: Blood pressure (!) 158/88, pulse 65, temperature 97.5 F (36.4 C), resp. rate 19, height 6\' 1"  (1.854 m), weight 278 lb (126.1 kg), SpO2 100 %.  GENERAL:  Well appearing HEENT:  Pupils equal round and reactive, fundi not visualized, oral mucosa unremarkable NECK:  No jugular venous distention, waveform within normal limits, carotid upstroke brisk and symmetric, no bruits, no thyromegaly LYMPHATICS:  No cervical, inguinal adenopathy LUNGS:  Clear to auscultation bilaterally BACK:  No CVA tenderness CHEST:  Unremarkable HEART:  PMI not displaced or sustained,S1 and S2 within normal limits, no S3, no clicks, no rubs, no murmurs, irregular ABD:  Flat, positive bowel sounds normal in frequency in pitch, no bruits,  no rebound, no guarding, no midline pulsatile mass, no hepatomegaly, no splenomegaly EXT:  2 plus pulses throughout, no edema, no cyanosis no clubbing, chronic venous stasis changes.  SKIN:  No rashes no nodules NEURO:  Cranial nerves II through XII grossly intact, motor grossly intact throughout PSYCH:  Cognitively intact, oriented to person place and time  Labs: Lab Results  Component Value Date   BUN 6 06/24/2016   Lab Results  Component Value Date   CREATININE 0.66 06/24/2016   Lab Results  Component Value Date   NA 138 06/24/2016   K 3.4 (L) 06/24/2016   CL 109 06/24/2016   CO2 22 06/24/2016   No results found for: TROPONINI Lab Results  Component Value Date   WBC 12.3 (H) 06/23/2016   HGB 12.7 (L) 06/23/2016   HCT 36.1  (L) 06/23/2016   MCV 88.3 06/23/2016   PLT 250 06/23/2016   No results found for: CHOL, HDL, LDLCALC, LDLDIRECT, TRIG, CHOLHDL Lab Results  Component Value Date   ALT 13 (L) 06/22/2016   AST 19 06/22/2016   ALKPHOS 67 06/22/2016   BILITOT 2.1 (H) 06/22/2016     Radiology:   CXR: The heart size and mediastinal contours are within normal limits. Both lungs are clear. No pneumothorax or pleural effusion is noted. Old left clavicular fracture is noted.  EKG:  Atrial fib, rate 64, axis WNL, intervals WNL, no acute ST T wave changes.  06/24/2016   ASSESSMENT AND PLAN:   ATRIAL FIB:  No idea how long this has been going on.  He will need DOAC when it is OK from a surgical standpoint.  I would suggest Xarelto .  However, he will need financial help with this.  Consult caseworker.  Check an echocardiogram.  Needs TSH.   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  Check echo as above.  RISK REDUCTION:  Check lipids.    SignedRollene Rotunda 06/24/2016, 5:32 PM

## 2016-06-24 NOTE — Progress Notes (Signed)
PHARMACIST - PHYSICIAN COMMUNICATION DR:   Sunnie Nielsenegalado CONCERNING:  DM managment  RECOMMENDATION: Consider adding aspirin for CAD prevention, ACE-I for elevated BP, and statin for cholesterol   Loura BackJennifer Woodland, PharmD, BCPS Clinical Pharmacist Phone for today [phone]- [phone] Main pharmacy - [phone] 06/24/2016 10:16 AM

## 2016-06-24 NOTE — Anesthesia Procedure Notes (Addendum)
Anesthesia Regional Block:  Popliteal block  Pre-Anesthetic Checklist: ,, timeout performed, Correct Patient, Correct Site, Correct Laterality, Correct Procedure, Correct Position, site marked, Risks and benefits discussed,  Surgical consent,  Pre-op evaluation,  At surgeon's request and post-op pain management  Laterality: Left  Prep: chloraprep       Needles:  Injection technique: Single-shot  Needle Type: Echogenic Stimulator Needle     Needle Length: 9cm 9 cm Needle Gauge: 21 and 21 G    Additional Needles:  Procedures: ultrasound guided (picture in chart) Popliteal block Narrative:  Start time: 06/24/2016 1:51 PM End time: 06/24/2016 1:54 PM Injection made incrementally with aspirations every 5 mL.  Performed by: Personally  Anesthesiologist: Linton RumpALLAN, Kimbely Whiteaker DICKERSON  Additional Notes: Patient endorses numbness in his left foot.

## 2016-06-24 NOTE — Progress Notes (Signed)
Reviewed Nutritional management book and educational book regarding diabetes with patient as well as insulin syringes and insulin.  Talk with him regarding carbohydrate intake throughout the day with teach back method used. Charlynn GrimesKim M Urban-Tucker, MSN,RN

## 2016-06-24 NOTE — Op Note (Signed)
06/22/2016 - 06/24/2016  5:10 PM  PATIENT:  Paul GasmanKenneth W Mclaughlin    PRE-OPERATIVE DIAGNOSIS:  CELLULITIS OF FOOT with abscess and osteomyelitis left little toe  POST-OPERATIVE DIAGNOSIS:  Same  PROCEDURE:  Left foot FOOT FIFTH RAY  AMPUTATION Local tissue rearrangement for wound closure 10 x 3 cm  SURGEON:  Nadara MustardMarcus V Duda, MD  PHYSICIAN ASSISTANT:None ANESTHESIA:   General  PREOPERATIVE INDICATIONS:  Paul Mclaughlin is a  60 y.o. male with a diagnosis of CELLULITIS OF FOOT who failed conservative measures and elected for surgical management.    The risks benefits and alternatives were discussed with the patient preoperatively including but not limited to the risks of infection, bleeding, nerve injury, cardiopulmonary complications, the need for revision surgery, among others, and the patient was willing to proceed.  OPERATIVE IMPLANTS: None  OPERATIVE FINDINGS: Large abscess involving the fifth ray with ischemic ulcer dorsally over the fourth ray  OPERATIVE PROCEDURE: Patient brought to operating room after undergoing a regional block. After adequate levels anesthesia obtained patient's left lower extremity was prepped using DuraPrep draped into a sterile field. A timeout was called. A racquet type incision was made to encompass the fifth toe as well as the ischemic ulcer dorsally over the fourth ray. The fifth metatarsal was resected through the base and the necrotic skin and fifth ray were resected in 1 block of tissue. The remainder nonviable tissue was removed with a Ronjair. The wound was irrigated with normal saline. Electrocautery was used for hemostasis. Local tissue rearrangement was used to close the wound that was 10 x 3 cm with 2-0 nylon. The wound closed nicely without tension on the skin. A sterile compressive dressing was applied patient was taken to the PACU in stable condition.  Patient will need to continue his IV antibiotics through Monday and may discharge at that time on  oral antibiotics.

## 2016-06-24 NOTE — Consult Note (Signed)
ORTHOPAEDIC CONSULTATION  REQUESTING PHYSICIAN: Alba Cory, MD  Chief Complaint: Abscess cellulitis left foot little toe  HPI: Paul Mclaughlin is a 60 y.o. male who presents with purulent drainage left little toe with cellulitis involving the dorsum of the left foot. Patient has diabetic insensate neuropathy does not smoke reports a several week history of pain swelling and drainage.  Past Medical History:  Diagnosis Date  . Cellulitis and abscess of left leg 06/2016  . Diabetes [facility])    Past Surgical History:  Procedure Laterality Date  . open heart surgery     as a child   Social History   Social History  . Marital status: Legally Separated    Spouse name: N/A  . Number of children: N/A  . Years of education: N/A   Social History Main Topics  . Smoking status: Never Smoker  . Smokeless tobacco: Never Used  . Alcohol use No  . Drug use:     Types: Marijuana  . Sexual activity: Not Asked   Other Topics Concern  . None   Social History Narrative  . None   Family History  Problem Relation Age of Onset  . Diabetes Mellitus II Mother   . Diabetes Mellitus II Brother    - negative except otherwise stated in the family history section No Known Allergies Prior to Admission medications   Medication Sig Start Date End Date Taking? Authorizing Provider  ciprofloxacin (CIPRO) 750 MG tablet Take 750 mg by mouth daily.   Yes Historical Provider, MD  metFORMIN (GLUCOPHAGE) 500 MG tablet Take 500 mg by mouth 2 (two) times daily with a meal.   Yes Historical Provider, MD  metroNIDAZOLE (FLAGYL) 500 MG tablet Take 500 mg by mouth 3 (three) times daily.   Yes Historical Provider, MD   Dg Chest 2 View  Result Date: 06/22/2016 CLINICAL DATA:  Left toe wound. EXAM: CHEST  2 VIEW COMPARISON:  None. FINDINGS: The heart size and mediastinal contours are within normal limits. Both lungs are clear. No pneumothorax or pleural effusion is noted. Old left clavicular fracture is  noted. IMPRESSION: No active cardiopulmonary disease. Electronically Signed   By: Lupita Raider, M.D.   On: 06/22/2016 15:09   Mr Foot Left Wo Contrast  Result Date: 06/23/2016 CLINICAL DATA:  Swallow left foot. Blistering. Assessment for osteomyelitis. EXAM: MRI OF THE LEFT FOOT WITHOUT CONTRAST TECHNIQUE: Multiplanar, multisequence MR imaging was performed. No intravenous contrast was administered. COMPARISON:  06/22/2016 FINDINGS: Osteomyelitis protocol MRI of the foot was obtained, to include the entire foot and ankle. This protocol uses a large field of view to cover the entire foot and ankle, and is suitable for assessing bony structures for osteomyelitis. Due to the large field of view and imaging plane choice, this protocol is less sensitive for assessing small structures such as ligamentous structures of the foot and ankle, compared to a dedicated forefoot or dedicated hindfoot exam. Diffuse confluent edema is noted along the ankle, especially medially, and tracking in the dorsum of the foot into the toes. There is edema in the plantar musculature of the foot and a lesser amount of edema in the distal plantar foot subcutaneous tissues. This appearance could certainly represent cellulitis. Abnormal and thickened medial band of the plantar fascia compatible with plantar fasciitis or partial tear. Plantar and Achilles calcaneal spurs of the calcaneus noted. There is abnormal edema signal in the phalanges of the small toe suspicious for osteomyelitis in this setting. Dorsally in  the small toe there is a 3.7 by 1.4 by 0.8 cm (volume = 2 cm^3) high T2 signal region suspicious for abscess or phlegmon. The Degenerative findings along the Lisfranc joint. IMPRESSION: 1. Abnormal marrow edema in the small toe phalanges suspicious for osteomyelitis. Small abscess or phlegmon dorsally in the small toe. 2. Diffuse confluent edema along the ankle and tracking along the dorsum of the foot and along the ball of the  foot, suspicious for possible cellulitis. 3. Edema at the plantar musculature of the foot, probably from myositis. The gas is identified tracking in the soft tissues of the left foot. Electronically Signed   By: Gaylyn RongWalter  Liebkemann M.D.   On: 06/23/2016 07:58   Dg Foot Complete Left  Result Date: 06/22/2016 CLINICAL DATA:  Left foot wound. EXAM: LEFT FOOT - COMPLETE 3+ VIEW COMPARISON:  None. FINDINGS: There is no evidence of fracture or dislocation. There is no evidence of arthropathy. Moderate spurring of posterior calcaneus is noted. Soft tissues are unremarkable. IMPRESSION: Moderate posterior calcaneal spurring. No acute abnormality seen in the left foot. Electronically Signed   By: Lupita RaiderJames  Green Jr, M.D.   On: 06/22/2016 15:12   - pertinent xrays, CT, MRI studies were reviewed and independently interpreted  Positive ROS: All other systems have been reviewed and were otherwise negative with the exception of those mentioned in the HPI and as above.  Physical Exam: General: Alert, no acute distress Psychiatric: Patient is competent for consent with normal mood and affect Lymphatic: No axillary or cervical lymphadenopathy Cardiovascular: No pedal edema Respiratory: No cyanosis, no use of accessory musculature GI: No organomegaly, abdomen is soft and non-tender  Skin: Patient has cellulitis in the dorsum of the left foot there is necrotic skin over the fourth and fifth metatarsal with purulent drainage from the fifth toe.   Neurologic: Patient does not have protective sensation bilateral lower extremities.   MUSCULOSKELETAL:  Patient has a good dorsalis pedis pulse. There is no cellulitis proximal to the ankle. MRI scan shows an abscess dorsal laterally.  Assessment: Assessment: Diabetic insensate neuropathy with abscess ulceration and necrotic tissue fifth toe left foot.  Plan: Plan: We will plan for fifth ray amputation with local tissue rearrangement. Patient does have a necrotic  tissue dorsally over the fourth ray and there is concern there may not be enough soft tissue to cover this necrotic area. Plan for surgery this afternoon nothing by mouth today.  Thank you for the consult and the opportunity to see Paul Mclaughlin  Ajia Chadderdon, MD [facility] [phone] 6:47 AM

## 2016-06-24 NOTE — Plan of Care (Signed)
Problem: Food- and Nutrition-Related Knowledge Deficit (NB-1.1) Goal: Nutrition education Formal process to instruct or train a patient/client in a skill or to impart knowledge to help patients/clients voluntarily manage or modify food choices and eating behavior to maintain or improve health. Outcome: Completed/Met Date Met: 06/24/16  RD consulted for nutrition education regarding diabetes.   RD provided "Carbohydrate Counting for People with Diabetes" handout from the Academy of Nutrition and Dietetics. Discussed different food groups and their effects on blood sugar, emphasizing carbohydrate-containing foods. Provided list of carbohydrates and recommended serving sizes of common foods.  Discussed importance of controlled and consistent carbohydrate intake throughout the day. Pt reports consuming large amount of snacks at home and has desire to stop. Provided examples of ways to balance meals/snacks and encouraged intake of high-fiber, whole grain complex carbohydrates. Teach back method used.  Expect good compliance.  Corrin Parker, MS, RD, LDN Pager # [phone] After hours/ weekend pager # [phone]

## 2016-06-24 NOTE — Progress Notes (Signed)
Inpatient Diabetes Program Recommendations  AACE/ADA: New Consensus Statement on Inpatient Glycemic Control (2015)  Target Ranges:  Prepandial:   less than 140 mg/dL      Peak postprandial:   less than 180 mg/dL (1-2 hours)      Critically ill patients:  140 - 180 mg/dL   Lab Results  Component Value Date   GLUCAP 177 (H) 06/24/2016    Review of Glycemic Control  Results for Paul Mclaughlin, Paul Mclaughlin (MRN [id number]) as of 06/24/2016 10:06  Ref. Range 06/23/2016 06:49 06/23/2016 11:13 06/23/2016 16:50 06/23/2016 21:28 06/24/2016 06:12  Glucose-Capillary Latest Ref Range: 65 - 99 mg/dL 782187 (H) 956213 (H) 213194 (H) 225 (H) 177 (H)    Spoke to patient at the bedside.  He saw an MD at a walk-in clinic on Monday reports this is when he found out he had diabetes. He does not have a blood sugar meter so I have requested MD order it with strips and lancets.  If he is going to purchase this, the cheapest option will be the Reli-On glucometer from [employer].   I have requested dietitian see him tomorrow for basic diabetes education.  Reviewed A1C and the need to bring it down (control blood sugars) in order to aid in getting the infection cleared.  Patient receptive.  Susette RacerJulie Mahir Prabhakar, RN, BA, MHA, CDE Diabetes Coordinator Inpatient Diabetes Program  [phone] (Team Pager) [phone] [facility] Office) 06/24/2016 10:12 AM

## 2016-06-24 NOTE — Transfer of Care (Signed)
Immediate Anesthesia Transfer of Care Note  Patient: Paul Mclaughlin  Procedure(s) Performed: Procedure(s): FOOT FIFTH RAY TOE AMPUTATION (Left)  Patient Location: PACU  Anesthesia Type:MAC  Level of Consciousness: awake, alert , oriented and patient cooperative  Airway & Oxygen Therapy: Patient Spontanous Breathing and Patient connected to face mask oxygen  Post-op Assessment: Report given to RN and Post -op Vital signs reviewed and stable  Post vital signs: Reviewed  Last Vitals:  Vitals:   06/23/16 2058 06/24/16 0615  BP: (!) 166/76 (!) 173/70  Pulse: 84 66  Resp: 17 16  Temp: 37.4 C 36.6 C    Last Pain:  Vitals:   06/24/16 0615  TempSrc: Oral  PainSc:          Complications: No apparent anesthesia complications

## 2016-06-24 NOTE — Anesthesia Procedure Notes (Signed)
Procedure Name: MAC Date/Time: 06/24/2016 4:24 PM Performed by: Lovie CholOCK, Orlan Aversa K Pre-anesthesia Checklist: Patient identified, Emergency Drugs available, Suction available, Patient being monitored and Timeout performed Patient Re-evaluated:Patient Re-evaluated prior to inductionOxygen Delivery Method: Simple face mask

## 2016-06-24 NOTE — Progress Notes (Addendum)
PROGRESS NOTE    Paul Mclaughlin  QHU:[id number] DOB: 1956/04/23 DOA: 06/22/2016 PCP: Pcp Not In System   Brief Narrative: Paul Mclaughlin is a 60 y.o. male recently diagnosed with diabetes mellitus. He noticed a blister on his left foot, small toe a couple of weeks ago, which gradually worsened. Last weekend around 6 days ago he noticed discoloration and last Monday (3 days ago) he went to his physicians office. At his physicians office he was noted to have a hemoglobin A1C of 10.1. He was started on an antibiotic for left foot cellulitis and metformin for diabetes mellitus. Despite, taking this antibiotic his foot swelling and erythema started moving proximally and there was discharge from the blister. On presentation to the ER patient was tachycardic and mildly febrile with elevate lactate. Xray doesn't show any bone involvment. Patient is being admitted for cellulitis with possible developing sepsis. On exam patient has erythema and swelling extending from the toes to the ankle joint. Patient has an ulceration of the dorsal aspect of the left foot small toe with discharge and mild sloughing. While in the ED, patient was given fluid bolus started on vancomycin and zosyn after blood cultures were obtained.    ED Course: Sodium 132, glucose 222, lactic acid 1.99, WBC 18.2 While in the ED patient was given fluid bolus started on vancomycin and zosyn, blood cultures pending   Assessment & Plan:   Principal Problem:   Cellulitis of left foot Active Problems:   Controlled type 2 diabetes mellitus with hyperglycemia (HCC)   1-Left foot cellulitis, small toe phalanges with possible Osteomyelitis , abscess.  MRI Small Toe Phalanges suspicious for Osteomyelitis Small abscess or phlegmon dorsally in the small toe. Diffuse confluent edema along the ankle and tracking along the dorsum of the foot and along the ball of the foot, suspicious for possible cellulitis. Continue with IV Vancomycin and Zosyn.    Ortho consulted.  WBC trending down 18---12. Plan for fifth ray amputation this afternoon.  Will check EKG routine.   2-DM, HbA1c at 10. _0 Continue with lantus.  He will probably need insulin at discharge/.   3-HTN; Hydralazine ordered prn.    DVT prophylaxis: lovenox Code Status: full code.  Family Communication: care discussed with patient  Disposition Plan: remain inpatient for treatment of foot infection   Consultants:   ortho   Procedures:     Antimicrobials:   Vancomycin , zosyn      Subjective: No new complaints. Denies chest pain or dyspnea.  Redness decreasing.   Objective: Vitals:   06/23/16 0545 06/23/16 1446 06/23/16 2058 06/24/16 0615  BP: 127/68 (!) 147/83 (!) 166/76 (!) 173/70  Pulse: 92 64 84 66  Resp: _1 Temp: 97.1 F (36.2 C) 98.1 F (36.7 C) 99.4 F (37.4 C) 97.8 F (36.6 C)  TempSrc: Oral Oral Oral Oral  SpO2: 97% 97% 96% 96%  Weight:      Height:        Intake/Output Summary (Last 24 hours) at 06/24/16 1004 Last data filed at 06/24/16 0449  Gross per 24 hour  Intake              860 ml  Output                0 ml  Net              860 ml   Filed Weights   06/22/16 1420  Weight: 126.1 kg (278 lb)  Examination:  General exam: Appears calm and comfortable  Respiratory system: Clear to auscultation. Respiratory effort normal. Cardiovascular system: S1 & S2 heard, RRR. No JVD, murmurs, rubs, gallops or clicks. No pedal edema. Gastrointestinal system: Abdomen is nondistended, soft and nontender. No organomegaly or masses felt. Normal bowel sounds heard. Central nervous system: Alert and oriented. No focal neurological deficits. Extremities: Symmetric 5 x 5 power. Skin: left LE foot with redness, small toe with edema, redness, whitish coloration. Necrotic area near small toe.   Psychiatry: Judgement and insight appear normal. Mood & affect appropriate.     Data Reviewed: I have personally reviewed following  labs and imaging studies  CBC:  Recent Labs Lab 06/22/16 1423 06/23/16 0350  WBC 18.2* 12.3*  NEUTROABS 15.4*  --   HGB 15.2 12.7*  HCT 43.3 36.1*  MCV 89.6 88.3  PLT 281 161   Basic Metabolic Panel:  Recent Labs Lab 06/22/16 1423 06/23/16 0350  NA 132* 136  K 4.8 3.7  CL 96* 104  CO2 17* 24  GLUCOSE 222* 191*  BUN 13 16  CREATININE 1.00 0.90  CALCIUM 9.1 8.4*   GFR: Estimated Creatinine Clearance: 123 mL/min (by C-G formula based on SCr of 0.9 mg/dL). Liver Function Tests:  Recent Labs Lab 06/22/16 1423  AST 19  ALT 13*  ALKPHOS 67  BILITOT 2.1*  PROT 8.1  ALBUMIN 3.4*   No results for input(s): LIPASE, AMYLASE in the last 168 hours. No results for input(s): AMMONIA in the last 168 hours. Coagulation Profile: No results for input(s): INR, PROTIME in the last 168 hours. Cardiac Enzymes: No results for input(s): CKTOTAL, CKMB, CKMBINDEX, TROPONINI in the last 168 hours. BNP (last 3 results) No results for input(s): PROBNP in the last 8760 hours. HbA1C: No results for input(s): HGBA1C in the last 72 hours. CBG:  Recent Labs Lab 06/23/16 0649 06/23/16 1113 06/23/16 1650 06/23/16 2128 06/24/16 0612  GLUCAP 187* 213* 194* 225* 177*   Lipid Profile: No results for input(s): CHOL, HDL, LDLCALC, TRIG, CHOLHDL, LDLDIRECT in the last 72 hours. Thyroid Function Tests: No results for input(s): TSH, T4TOTAL, FREET4, T3FREE, THYROIDAB in the last 72 hours. Anemia Panel: No results for input(s): VITAMINB12, FOLATE, FERRITIN, TIBC, IRON, RETICCTPCT in the last 72 hours. Sepsis Labs:  Recent Labs Lab 06/22/16 1457 06/22/16 1846 06/23/16 0350  LATICACIDVEN 2.01* 1.99* 0.9    Recent Results (from the past 240 hour(s))  Culture, blood (Routine x 2)     Status: None (Preliminary result)   Collection Time: 06/22/16  2:47 PM  Result Value Ref Range Status   Specimen Description BLOOD LEFT ARM  Final   Special Requests BOTTLES DRAWN AEROBIC AND ANAEROBIC  5CC  Final   Culture NO GROWTH < 24 HOURS  Final   Report Status PENDING  Incomplete  Culture, blood (Routine x 2)     Status: None (Preliminary result)   Collection Time: 06/22/16  6:02 PM  Result Value Ref Range Status   Specimen Description BLOOD RIGHT HAND  Final   Special Requests BOTTLES DRAWN AEROBIC ONLY 5CC  Final   Culture NO GROWTH < 24 HOURS  Final   Report Status PENDING  Incomplete         Radiology Studies: Dg Chest 2 View  Result Date: 06/22/2016 CLINICAL DATA:  Left toe wound. EXAM: CHEST  2 VIEW COMPARISON:  None. FINDINGS: The heart size and mediastinal contours are within normal limits. Both lungs are clear. No pneumothorax or pleural effusion  is noted. Old left clavicular fracture is noted. IMPRESSION: No active cardiopulmonary disease. Electronically Signed   By: Marijo Conception, M.D.   On: 06/22/2016 15:09   Mr Foot Left Wo Contrast  Result Date: 06/23/2016 CLINICAL DATA:  Swallow left foot. Blistering. Assessment for osteomyelitis. EXAM: MRI OF THE LEFT FOOT WITHOUT CONTRAST TECHNIQUE: Multiplanar, multisequence MR imaging was performed. No intravenous contrast was administered. COMPARISON:  06/22/2016 FINDINGS: Osteomyelitis protocol MRI of the foot was obtained, to include the entire foot and ankle. This protocol uses a large field of view to cover the entire foot and ankle, and is suitable for assessing bony structures for osteomyelitis. Due to the large field of view and imaging plane choice, this protocol is less sensitive for assessing small structures such as ligamentous structures of the foot and ankle, compared to a dedicated forefoot or dedicated hindfoot exam. Diffuse confluent edema is noted along the ankle, especially medially, and tracking in the dorsum of the foot into the toes. There is edema in the plantar musculature of the foot and a lesser amount of edema in the distal plantar foot subcutaneous tissues. This appearance could certainly represent  cellulitis. Abnormal and thickened medial band of the plantar fascia compatible with plantar fasciitis or partial tear. Plantar and Achilles calcaneal spurs of the calcaneus noted. There is abnormal edema signal in the phalanges of the small toe suspicious for osteomyelitis in this setting. Dorsally in the small toe there is a 3.7 by 1.4 by 0.8 cm (volume = 2 cm^3) high T2 signal region suspicious for abscess or phlegmon. The Degenerative findings along the Lisfranc joint. IMPRESSION: 1. Abnormal marrow edema in the small toe phalanges suspicious for osteomyelitis. Small abscess or phlegmon dorsally in the small toe. 2. Diffuse confluent edema along the ankle and tracking along the dorsum of the foot and along the ball of the foot, suspicious for possible cellulitis. 3. Edema at the plantar musculature of the foot, probably from myositis. The gas is identified tracking in the soft tissues of the left foot. Electronically Signed   By: Van Clines M.D.   On: 06/23/2016 07:58   Dg Foot Complete Left  Result Date: 06/22/2016 CLINICAL DATA:  Left foot wound. EXAM: LEFT FOOT - COMPLETE 3+ VIEW COMPARISON:  None. FINDINGS: There is no evidence of fracture or dislocation. There is no evidence of arthropathy. Moderate spurring of posterior calcaneus is noted. Soft tissues are unremarkable. IMPRESSION: Moderate posterior calcaneal spurring. No acute abnormality seen in the left foot. Electronically Signed   By: Marijo Conception, M.D.   On: 06/22/2016 15:12        Scheduled Meds: . chlorhexidine  60 mL Topical Once  . enoxaparin (LOVENOX) injection  40 mg Subcutaneous Q24H  . insulin aspart  0-9 Units Subcutaneous TID WC  . insulin glargine  10 Units Subcutaneous QHS  . insulin starter kit- syringes  1 kit Other Once  . living well with diabetes book   Does not apply Once  . piperacillin-tazobactam (ZOSYN)  IV  3.375 g Intravenous Q8H  . povidone-iodine  2 application Topical Once  . vancomycin   1,000 mg Intravenous Q12H   Continuous Infusions:     LOS: 2 days    Time spent: 35 minutes.     Elmarie Shiley, MD [facility] Pager [phone]  If 7PM-7AM, please contact night-coverage [email or web address] Password [email or web address] 06/24/2016, 10:04 AM

## 2016-06-24 NOTE — Progress Notes (Addendum)
Initial Nutrition Assessment  DOCUMENTATION CODES:   Obesity unspecified  INTERVENTION:  Once diet advances,  Provide Glucerna Shake po BID, each supplement provides 220 kcal and 10 grams of protein.  Provide 30 ml Prostat po once daily, each supplement provides 100 kcal and 15 grams of protein.   Diet education regarding diabetes given.   NUTRITION DIAGNOSIS:   Increased nutrient needs related to wound healing as evidenced by estimated needs.  GOAL:   Patient will meet greater than or equal to 90% of their needs  MONITOR:   Supplement acceptance, Diet advancement, Labs, Weight trends, Skin, I & O's  REASON FOR ASSESSMENT:   Consult Diet education  ASSESSMENT:   60 y.o. male recently diagnosed with diabetes mellitus. He noticed a blister on his left foot, small toe a couple of weeks ago, which gradually worsened.  On presentation to the ER patient was tachycardic and mildly febrile with elevate lactate. Xray doesn't show any bone involvment. Patient is being admitted for cellulitis with possible developing sepsis. Plan for fifth ray amputation this afternoon.   Pt is currently NPO for surgery today. Meal completion prior to NPO status has been 50-75%. Pt reports having a decreased appetite which has been ongoing over the past 2 weeks. Pt reports still consuming 2 meals a day with snacks in between at home. Usual body weight reported to be ~280 lbs which he reported last weighing on Monday. Recent weight loss not significant. Pt is agreeable to nutritional supplements to aid in wound healing. RD to order. Nursing staff to provide once diet advances. RD additionally consulted for diet education. Education given.   Pt with no observed significant fat or muscle mass loss.   Labs and medications reviewed. Potassium low at 3.4. CBG's 177-225 mg/dL.  Diet Order:  Diet NPO time specified Except for: Sips with Meds  Skin:  Wound (see comment) (Diabetic ulcer L foot)  Last BM:   10/12  Height:   Ht Readings from Last 1 Encounters:  06/22/16 6\' 1"  (1.854 m)    Weight:   Wt Readings from Last 1 Encounters:  06/22/16 278 lb (126.1 kg)    Ideal Body Weight:  83.6 kg  BMI:  Body mass index is 36.68 kg/m.  Estimated Nutritional Needs:   Kcal:  2100-2300  Protein:  120-135 grams  Fluid:  2.1 - 2.3 L/day  EDUCATION NEEDS:   Education needs addressed  Roslyn SmilingStephanie Kinslee Dalpe, MS, RD, LDN Pager # [phone] After hours/ weekend pager # [phone]

## 2016-06-24 NOTE — Anesthesia Preprocedure Evaluation (Addendum)
Anesthesia Evaluation  Patient identified by MRN, date of birth, ID band Patient awake    Reviewed: Allergy & Precautions, NPO status , Patient's Chart, lab work & pertinent test results  History of Anesthesia Complications Negative for: history of anesthetic complications  Airway Mallampati: III  TM Distance: >3 FB Neck ROM: Full   Comment: Bushy beard Dental  (+) Missing, Dental Advisory Given, Poor Dentition,    Pulmonary neg pulmonary ROS,    Pulmonary exam normal breath sounds clear to auscultation       Cardiovascular (-) angina(-) Past MI, (-) Cardiac Stents, (-) CABG and (-) Orthopnea  Rhythm:Regular Rate:Normal  H/o open heart surgery at 60yo for a hole in 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 No problems since then.  Not sure if he can do 10 steps without SOB but goes up 5 steps to get into his house every day without difficulty.   Neuro/Psych negative neurological ROS     GI/Hepatic (+)     substance abuse  marijuana use,   Endo/Other  diabetes (Hgb A1c 10.1), Poorly Controlled, Type 2, Oral Hypoglycemic Agents, Insulin Dependent  Renal/GU      Musculoskeletal   Abdominal (+) + obese,   Peds  Hematology   Anesthesia Other Findings Cellulitis of left foot  Reproductive/Obstetrics                           Anesthesia Physical Anesthesia Plan  ASA: III  Anesthesia Plan: Regional and MAC   Post-op Pain Management:    Induction: Intravenous  Airway Management Planned: Natural Airway and Simple Face Mask  Additional Equipment:   Intra-op Plan:   Post-operative Plan:   Informed Consent: I have reviewed the patients History and Physical, chart, labs and discussed the procedure including the risks, benefits and alternatives for the proposed anesthesia with the patient or authorized representative who has indicated his/her understanding and acceptance.   Dental advisory given  Plan  Discussed with: CRNA  Anesthesia Plan Comments: (Discussed potential risks of nerve blocks including, but not limited to, infection, bleeding, nerve damage, seizures, pneumothorax, respiratory depression, and potential failure of the block. Alternatives to nerve blocks discussed. All questions answered. )       Anesthesia Quick Evaluation

## 2016-06-24 NOTE — Progress Notes (Signed)
Inpatient Diabetes Program Recommendations  AACE/ADA: New Consensus Statement on Inpatient Glycemic Control (2015)  Target Ranges:  Prepandial:   less than 140 mg/dL      Peak postprandial:   less than 180 mg/dL (1-2 hours)      Critically ill patients:  140 - 180 mg/dL   Lab Results  Component Value Date   GLUCAP 177 (H) 06/24/2016    Review of Glycemic Control  Results for Paul Mclaughlin, Paul Mclaughlin (MRN [id number]) as of 06/24/2016 09:01  Ref. Range 06/23/2016 06:49 06/23/2016 11:13 06/23/2016 16:50 06/23/2016 21:28 06/24/2016 06:12  Glucose-Capillary Latest Ref Range: 65 - 99 mg/dL 187 (H) 213 (H) 194 (H) 225 (H) 177 (H)    Diabetes history: Recent diagnosis- A1C 10.1% Outpatient Diabetes medications: Metformin 548m bid Current orders for Inpatient glycemic control: Lantus 10 units qhs, Novolog 0-9 units tid  Inpatient Diabetes Program Recommendations:  Consider increasing Lantus to 13 units qhs (0.1 unit/kg)  Please consider d/c patient home on insulin - A1C 10.1%.  If insulin is started I would recommend [employer] Reli-on insulin N insulin 7 units bid and increase Metformin to 100108mbid.  Please have patient watch patient education videos on diabetes: 501-Basic skills for controlling diabetes; 50340-GEEATVVLRTong Term Complications; 50740-ZLYTSSQSour Diabetes; 504-Introduction to Carb Counting; 505-Diabetes: Foot Care; 506-Diabetes: Insulin Injection; 507-Diabetes & Nutrition: Eating for Health; 508-Diabetes: Reducing Fears About Injections; 509-Diabetes and Heart Disease; 510-Monitoring Blood Glucose.  Living Well with Diabetes book and insulin syringe kit ordered.   JuGentry FitzRN, BA, MHA, CDE Diabetes Coordinator Inpatient Diabetes Program  [phone] Pager) [phone]/13/2017 9:02 AM

## 2016-06-25 DIAGNOSIS — I481 Persistent atrial fibrillation: Secondary | ICD-10-CM

## 2016-06-25 DIAGNOSIS — L03116 Cellulitis of left lower limb: Secondary | ICD-10-CM

## 2016-06-25 LAB — CBC
HEMATOCRIT: 37.5 % — AB (ref 39.0–52.0)
Hemoglobin: 12.7 g/dL — ABNORMAL LOW (ref 13.0–17.0)
MCH: 30.3 pg (ref 26.0–34.0)
MCHC: 33.9 g/dL (ref 30.0–36.0)
MCV: 89.5 fL (ref 78.0–100.0)
PLATELETS: 229 10*3/uL (ref 150–400)
RBC: 4.19 MIL/uL — ABNORMAL LOW (ref 4.22–5.81)
RDW: 12.8 % (ref 11.5–15.5)
WBC: 9.1 10*3/uL (ref 4.0–10.5)

## 2016-06-25 LAB — GLUCOSE, CAPILLARY
GLUCOSE-CAPILLARY: 232 mg/dL — AB (ref 65–99)
Glucose-Capillary: 194 mg/dL — ABNORMAL HIGH (ref 65–99)
Glucose-Capillary: 232 mg/dL — ABNORMAL HIGH (ref 65–99)
Glucose-Capillary: 246 mg/dL — ABNORMAL HIGH (ref 65–99)

## 2016-06-25 LAB — BASIC METABOLIC PANEL
Anion gap: 8 (ref 5–15)
BUN: 9 mg/dL (ref 6–20)
CALCIUM: 8.7 mg/dL — AB (ref 8.9–10.3)
CO2: 26 mmol/L (ref 22–32)
CREATININE: 0.88 mg/dL (ref 0.61–1.24)
Chloride: 100 mmol/L — ABNORMAL LOW (ref 101–111)
GFR calc Af Amer: >60 mL/min (ref >60)
GFR calc non Af Amer: >60 mL/min (ref >60)
GLUCOSE: 202 mg/dL — AB (ref 65–99)
Potassium: 3.6 mmol/L (ref 3.5–5.1)
Sodium: 134 mmol/L — ABNORMAL LOW (ref 135–145)

## 2016-06-25 LAB — TSH: TSH: 1.275 u[IU]/mL (ref 0.350–4.500)

## 2016-06-25 LAB — LIPID PANEL
CHOL/HDL RATIO: 8.7 ratio
CHOLESTEROL: 131 mg/dL (ref 0–200)
HDL: 15 mg/dL — ABNORMAL LOW (ref >40)
LDL Cholesterol: 86 mg/dL (ref 0–99)
TRIGLYCERIDES: 149 mg/dL (ref <150)
VLDL: 30 mg/dL (ref 0–40)

## 2016-06-25 MED ORDER — INSULIN GLARGINE 100 UNIT/ML ~~LOC~~ SOLN
13.0000 [IU] | Freq: Every day | SUBCUTANEOUS | Status: DC
  Administered 2016-06-25: 13 [IU] via SUBCUTANEOUS
  Filled 2016-06-25 (×2): qty 0.13

## 2016-06-25 MED ORDER — RIVAROXABAN 20 MG PO TABS
20.0000 mg | ORAL_TABLET | Freq: Every day | ORAL | Status: DC
  Administered 2016-06-25 – 2016-06-26 (×2): 20 mg via ORAL
  Filled 2016-06-25 (×2): qty 1

## 2016-06-25 MED ORDER — LISINOPRIL 10 MG PO TABS
10.0000 mg | ORAL_TABLET | Freq: Every day | ORAL | Status: DC
  Administered 2016-06-25 – 2016-06-27 (×3): 10 mg via ORAL
  Filled 2016-06-25 (×3): qty 1

## 2016-06-25 NOTE — Progress Notes (Addendum)
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  Other dx: DM, a-fib.   Clinical Impression  Patient is alert, is able to demonstrate Fair + weight bearing compliance using walker during level surface mobility and MIN Guard assist, but patient has 5 steps to enter home, with 2 rails, that are 'not too sturdy'.  Trial of crutches on stairs was poor, could manage with MOD assist using rails, but his are not as sturdy.  Patient does need to be able to manage stairs, but otherwise is fairly independent.  Will continue practice with PT in hospital, patient may benefit from short rehab stay if unable to realize progress however.    Follow Up Recommendations SNF;Home health PT (Would be appropriate for home health if can manage stairs)    Equipment Recommendations  Rolling walker with 5" wheels;Crutches (Crutches possibly, need more training, not safe currently)    Recommendations for Other Services OT consult     Precautions / Restrictions Precautions Precautions: Fall Restrictions Weight Bearing Restrictions: Yes LLE Weight Bearing: Non weight bearing      Mobility  Bed Mobility Overal bed mobility: Modified Independent                Transfers Overall transfer level: Needs assistance Equipment used: Rolling walker (2 wheeled) Transfers: Sit to/from BJ'sStand;Stand Pivot Transfers Sit to Stand: Min guard;From elevated surface Stand pivot transfers: Min guard       General transfer comment: Guard for decreased balance, due to NWB L LE.  Ambulation/Gait Ambulation/Gait assistance: Min guard Ambulation Distance (Feet): 30 Feet Assistive device: Rolling walker (2 wheeled)       General Gait Details: Hopping on Right foot.  Stairs Stairs: Yes Stairs assistance: Mod assist Stair  Management: Two rails Number of Stairs: 2 General stair comments: Patient concerned for his stairs, which are not very sturdy.  Trial crutches, but unable on evaluation.  Wheelchair Mobility    Modified Rankin (Stroke Patients Only)       Balance Overall balance assessment: Needs assistance   Sitting balance-Leahy Scale: Good     Standing balance support: Bilateral upper extremity supported Standing balance-Leahy Scale: Poor Standing balance comment: NWB L LE                             Pertinent Vitals/Pain Pain Assessment: No/denies pain    Home Living Family/patient expects to be discharged to:: Private residence Living Arrangements: Alone Available Help at Discharge: Friend(s);Available PRN/intermittently Type of Home: Mobile home Home Access: Stairs to enter Entrance Stairs-Rails: Can reach both (Wide spaced, not very sturdy.) Entrance Stairs-Number of Steps: 5 Home Layout: One level        Prior Function Level of Independence: Independent               Hand Dominance        Extremity/Trunk Assessment   Upper Extremity Assessment: Overall WFL for tasks assessed           Lower Extremity Assessment: Overall WFL for tasks assessed;LLE deficits/detail   LLE Deficits / Details: Non-weight bearing, has post-op shoe     Communication   Communication: No difficulties  Cognition Arousal/Alertness: Awake/alert Behavior During Therapy: WFL for tasks assessed/performed Overall Cognitive Status: Within Functional Limits for tasks assessed  General Comments General comments (skin integrity, edema, etc.): Low activity tolerance for stairs, on telemetry    Exercises     Assessment/Plan    PT Assessment Patient needs continued PT services  PT Problem List Decreased strength;Decreased activity tolerance;Decreased balance;Decreased mobility;Decreased knowledge of use of DME;Decreased knowledge of  precautions;Decreased safety awareness;Impaired sensation;Decreased skin integrity          PT Treatment Interventions DME instruction;Gait training;Stair training;Functional mobility training;Therapeutic activities;Therapeutic exercise;Balance training;Patient/family education    PT Goals (Current goals can be found in the Care Plan section)  Acute Rehab PT Goals Patient Stated Goal: To go home PT Goal Formulation: With patient Time For Goal Achievement: 07/09/16 Potential to Achieve Goals: Good    Frequency Min 3X/week   Barriers to discharge Inaccessible home environment;Decreased caregiver support 5 steps to enter home, lives alone    Co-evaluation               End of Session Equipment Utilized During Treatment: Gait belt Activity Tolerance: Patient tolerated treatment well Patient left: in bed;with call bell/phone within reach Nurse Communication: Mobility status         Time: 1140-1230 PT Time Calculation (min) (ACUTE ONLY): 50 min   Charges:   PT Evaluation $PT Eval Low Complexity: 1 Procedure PT Treatments $Gait Training: 23-37 mins $Therapeutic Activity: 8-22 mins   PT G Codes:        Lisa Milian L 07-Jul-2016, 1:22 PM

## 2016-06-25 NOTE — Progress Notes (Signed)
Patient ID: Agapito GamesKenneth W Gambrel, male   DOB: Nov 08, 1955, 60 y.o.   MRN: [id number] Tolerated surgery well yesterday.  Block still in effect.  Denies pain.  His left foot dressing is clean and dry.  Per Dr. Lajoyce Cornersuda, will need to stay on IV antibiotics thru the weekend.

## 2016-06-25 NOTE — Progress Notes (Signed)
Pharmacy Antibiotic Note  Paul Mclaughlin is a 60 y.o. male admitted on 06/22/2016 with left 5th toe cellulitis/abscess, MRI suspicious for osteo, now s/p amputation on 10/13.  Pharmacy has been consulted for Zosyn and vancomycin dosing. IV antibiotics to continue through weekend per Ortho. Renal function stable, norm CrCl ~92 ml/min.  Plan: Continue vancomycin 1 g IV q12h - will hold off on trough for now as may d/c after weekend Continue Zosyn 3.375 gm IV q8h (4 hour infusion) Monitor clinical picture, renal function, VT prn F/U C&S, abx deescalation / LOT Consider adding aspirin for CAD prevention although maybe not if Xarelto added, ACE-I for elevated BP/renal protection, and statin for cholesterol   Height: 6\' 1"  (185.4 cm) Weight: 278 lb (126.1 kg) IBW/kg (Calculated) : 79.9  Temp (24hrs), Avg:98.1 F (36.7 C), Min:97.5 F (36.4 C), Max:98.7 F (37.1 C)   Recent Labs Lab 06/22/16 1423 06/22/16 1457 06/22/16 1846 06/23/16 0350 06/24/16 1036 06/25/16 0452  WBC 18.2*  --   --  12.3*  --  9.1  CREATININE 1.00  --   --  0.90 0.66 0.88  LATICACIDVEN  --  2.01* 1.99* 0.9  --   --     Estimated Creatinine Clearance: 125.8 mL/min (by C-G formula based on SCr of 0.88 mg/dL).    No Known Allergies  Antimicrobials this admission: Zosyn 10/11 >>  Vanc 10/11 >>  Cipro/Flagyl PTA  Dose adjustments this admission: N/A  Microbiology results: 10/11 BCx: ngtd   Thank you for allowing pharmacy to be a part of this patient's care.  Loura BackJennifer Salcha, PharmD, BCPS Clinical Pharmacist Phone for today [phone] Main pharmacy - [phone] 06/25/2016 10:07 AM

## 2016-06-25 NOTE — Progress Notes (Addendum)
ANTICOAGULATION CONSULT NOTE - Initial Consult  Pharmacy Consult for Xarelto Indication: atrial fibrillation  No Known Allergies  Patient Measurements: Height: 6\' 1"  (185.4 cm) Weight: 278 lb (126.1 kg) IBW/kg (Calculated) : 79.9  Vital Signs: Temp: 98.4 F (36.9 C) (10/14 0500) Temp Source: Oral (10/14 0500) BP: 136/77 (10/14 1304) Pulse Rate: 81 (10/14 1304)  Labs:  Recent Labs  06/23/16 0350 06/24/16 1036 06/25/16 0452  HGB 12.7*  --  12.7*  HCT 36.1*  --  37.5*  PLT 250  --  229  CREATININE 0.90 0.66 0.88    Estimated Creatinine Clearance: 125.8 mL/min (by C-G formula based on SCr of 0.88 mg/dL).   Medical History: Past Medical History:  Diagnosis Date  . Cellulitis and abscess of left leg 06/2016  . Diabetes (HCC)     Medications:  Prescriptions Prior to Admission  Medication Sig Dispense Refill Last Dose  . ciprofloxacin (CIPRO) 750 MG tablet Take 750 mg by mouth daily.   06/22/2016 at 9am  . metFORMIN (GLUCOPHAGE) 500 MG tablet Take 500 mg by mouth 2 (two) times daily with a meal.   06/22/2016 at 9am  . metroNIDAZOLE (FLAGYL) 500 MG tablet Take 500 mg by mouth 3 (three) times daily.   06/22/2016 at 9am    Assessment: 60 y/o male admitted 06/22/2016 with toe cellulitis/abscess now s/p amputation. Also with new persistent Afib. Pharmacy consulted to begin Xarelto. CHADS2VASC is 1 (DM). Cardiology notes will need financial assistance. Renal function is normal. No bleeding noted, CBC is normal.  Goal of Therapy:  full anticoagulation Monitor platelets by anticoagulation protocol: Yes   Plan:  - Discontinue Lovenox - Xarelto 20 mg PO daily with supper - Education prior to discharge - Case management consulted for assistance with obtaining Xarelto - Pharmacy signing off but will follow peripherally  Loura BackJennifer Lodge, PharmD, BCPS Clinical Pharmacist Phone for today [phone] Main pharmacy - [phone] 06/25/2016 2:45 PM

## 2016-06-25 NOTE — Progress Notes (Signed)
   SUBJECTIVE: The patient is doing well today.  At this time, he denies chest pain, shortness of breath, or any new concerns.  . enoxaparin (LOVENOX) injection  40 mg Subcutaneous Q24H  . feeding supplement (GLUCERNA SHAKE)  237 mL Oral BID BM  . feeding supplement (PRO-STAT SUGAR FREE 64)  30 mL Oral Daily  . insulin aspart  0-9 Units Subcutaneous TID WC  . insulin glargine  10 Units Subcutaneous QHS  . piperacillin-tazobactam (ZOSYN)  IV  3.375 g Intravenous Q8H  . vancomycin  1,000 mg Intravenous Q12H   . sodium chloride 10 mL/hr at 06/24/16 1848    OBJECTIVE: Physical Exam: Vitals:   06/24/16 1839 06/24/16 2028 06/25/16 0019 06/25/16 0500  BP: (!) 163/83 (!) 143/69 (!) 161/71 (!) 166/79  Pulse: 60 67 71 76  Resp: 18 18 20 18   Temp:  98.2 F (36.8 C) 98.7 F (37.1 C) 98.4 F (36.9 C)  TempSrc: Oral Oral Oral Oral  SpO2: 98% 98% 97% 95%  Weight:      Height:        Intake/Output Summary (Last 24 hours) at 06/25/16 1138 Last data filed at 06/25/16 0809  Gross per 24 hour  Intake              300 ml  Output              765 ml  Net             -465 ml    Telemetry reveals rate controlled afib  GEN- The patient is well appearing, alert and oriented x 3 today.   Head- normocephalic, atraumatic Eyes-  Sclera clear, conjunctiva pink Ears- hearing intact Oropharynx- clear Neck- supple,   Lungs- Clear to ausculation bilaterally, normal work of breathing Heart- irregular rate and rhythm, no murmurs, rubs or gallops, PMI not laterally displaced GI- soft, NT, ND, + BS Extremities- no clubbing, cyanosis, or edema Skin- L foot in dressing Psych- euthymic mood, full affect Neuro- strength and sensation are intact  LABS: Basic Metabolic Panel:  Recent Labs  16/06/9609/13/17 1036 06/25/16 0452  NA 138 134*  K 3.4* 3.6  CL 109 100*  CO2 22 26  GLUCOSE 168* 202*  BUN 6 9  CREATININE 0.66 0.88  CALCIUM 7.4* 8.7*   Liver Function Tests:  Recent Labs  06/22/16 1423    AST 19  ALT 13*  ALKPHOS 67  BILITOT 2.1*  PROT 8.1  ALBUMIN 3.4*   No results for input(s): LIPASE, AMYLASE in the last 72 hours. CBC:  Recent Labs  06/22/16 1423 06/23/16 0350 06/25/16 0452  WBC 18.2* 12.3* 9.1  NEUTROABS 15.4*  --   --   HGB 15.2 12.7* 12.7*  HCT 43.3 36.1* 37.5*  MCV 89.6 88.3 89.5  PLT 281 250 229   Thyroid Function Tests:  Recent Labs  06/25/16 0452  TSH 1.275    ASSESSMENT AND PLAN:  Principal Problem:   Cellulitis of left foot Active Problems:   Controlled type 2 diabetes mellitus with hyperglycemia (HCC)  1. Persistent afib Rate controlled chads2vasc score is 1 Start on xarelto 20mg  daily if ok with primary team Echo pending  OK to discharge from cardiology standpoint when other issues resolve. Cardiology to see as needed while here. I will make outpatient follow-up with Dr Antoine PocheHochrein.  Hillis RangeJames Sheyli Horwitz, MD 06/25/2016 11:38 AM d

## 2016-06-25 NOTE — Progress Notes (Signed)
PROGRESS NOTE    Paul Mclaughlin  ZOX:[id number] DOB: Feb 13, 1956 DOA: 06/22/2016 PCP: Pcp Not In System   Brief Narrative: Paul Mclaughlin is a 60 y.o. male recently diagnosed with diabetes mellitus. He noticed a blister on his left foot, small toe a couple of weeks ago, which gradually worsened. Last weekend around 6 days ago he noticed discoloration and last Monday (3 days ago) he went to his physicians office. At his physicians office he was noted to have a hemoglobin A1C of 10.1. He was started on an antibiotic for left foot cellulitis and metformin for diabetes mellitus. Despite, taking this antibiotic his foot swelling and erythema started moving proximally and there was discharge from the blister. On presentation to the ER patient was tachycardic and mildly febrile with elevate lactate. Xray doesn't show any bone involvment. Patient is being admitted for cellulitis with possible developing sepsis. On exam patient has erythema and swelling extending from the toes to the ankle joint. Patient has an ulceration of the dorsal aspect of the left foot small toe with discharge and mild sloughing. While in the ED, patient was given fluid bolus started on vancomycin and zosyn after blood cultures were obtained.    ED Course: Sodium 132, glucose 222, lactic acid 1.99, WBC 18.2 While in the ED patient was given fluid bolus started on vancomycin and zosyn, blood cultures pending   Assessment & Plan:   Principal Problem:   Cellulitis of left foot Active Problems:   Controlled type 2 diabetes mellitus with hyperglycemia (HCC)   1-Left foot cellulitis, small toe phalanges with possible Osteomyelitis , abscess.  MRI Small Toe Phalanges suspicious for Osteomyelitis Small abscess or phlegmon dorsally in the small toe. Diffuse confluent edema along the ankle and tracking along the dorsum of the foot and along the ball of the foot, suspicious for possible cellulitis. Continue with IV Vancomycin and Zosyn .  Day 3.  Ortho consulted.  WBC trending down 18---12--9 S/P  fifth ray amputation  And Local tissue rearrangement for wound closure 10 x 3 cm  2-DM, HbA1c at 10. Continue with lantus. Increase to 13 units.  He will probably need insulin at discharge/.   3-HTN; Hydralazine ordered prn.  Start lisinopril.   4-Persistent A fib;  Rated controlled.  Will discussed with ortho regarding when will be ok to start Xarelto.  Follow ECHO.  LDL 86.  TSH 1.2.   DVT prophylaxis: lovenox Code Status: full code.  Family Communication: care discussed with patient  Disposition Plan: remain inpatient for treatment of foot infection   Consultants:   ortho   Procedures:     Antimicrobials:   Vancomycin , zosyn      Subjective: Feeling ok, no significant pain   Objective: Vitals:   06/24/16 1839 06/24/16 2028 06/25/16 0019 06/25/16 0500  BP: (!) 163/83 (!) 143/69 (!) 161/71 (!) 166/79  Pulse: 60 67 71 76  Resp: 18 18 20 18   Temp:  98.2 F (36.8 C) 98.7 F (37.1 C) 98.4 F (36.9 C)  TempSrc: Oral Oral Oral Oral  SpO2: 98% 98% 97% 95%  Weight:      Height:        Intake/Output Summary (Last 24 hours) at 06/25/16 1151 Last data filed at 06/25/16 0809  Gross per 24 hour  Intake              300 ml  Output              765 ml  Net             -465 ml   Filed Weights   06/22/16 1420  Weight: 126.1 kg (278 lb)    Examination:  General exam: Appears calm and comfortable  Respiratory system: Clear to auscultation. Respiratory effort normal. Cardiovascular system: S1 & S2 heard, RRR. No JVD, murmurs, rubs, gallops or clicks. No pedal edema. Gastrointestinal system: Abdomen is nondistended, soft and nontender. No organomegaly or masses felt. Normal bowel sounds heard. Central nervous system: Alert and oriented. No focal neurological deficits. Extremities: Symmetric 5 x 5 power. Skin: left LE foot with clean dressing.  Psychiatry: Judgement and insight appear normal. Mood  & affect appropriate.     Data Reviewed: I have personally reviewed following labs and imaging studies  CBC:  Recent Labs Lab 06/22/16 1423 06/23/16 0350 06/25/16 0452  WBC 18.2* 12.3* 9.1  NEUTROABS 15.4*  --   --   HGB 15.2 12.7* 12.7*  HCT 43.3 36.1* 37.5*  MCV 89.6 88.3 89.5  PLT 281 250 229   Basic Metabolic Panel:  Recent Labs Lab 06/22/16 1423 06/23/16 0350 06/24/16 1036 06/25/16 0452  NA 132* 136 138 134*  K 4.8 3.7 3.4* 3.6  CL 96* 104 109 100*  CO2 17* 24 22 26   GLUCOSE 222* 191* 168* 202*  BUN 13 16 6 9   CREATININE 1.00 0.90 0.66 0.88  CALCIUM 9.1 8.4* 7.4* 8.7*   GFR: Estimated Creatinine Clearance: 125.8 mL/min (by C-G formula based on SCr of 0.88 mg/dL). Liver Function Tests:  Recent Labs Lab 06/22/16 1423  AST 19  ALT 13*  ALKPHOS 67  BILITOT 2.1*  PROT 8.1  ALBUMIN 3.4*   No results for input(s): LIPASE, AMYLASE in the last 168 hours. No results for input(s): AMMONIA in the last 168 hours. Coagulation Profile: No results for input(s): INR, PROTIME in the last 168 hours. Cardiac Enzymes: No results for input(s): CKTOTAL, CKMB, CKMBINDEX, TROPONINI in the last 168 hours. BNP (last 3 results) No results for input(s): PROBNP in the last 8760 hours. HbA1C: No results for input(s): HGBA1C in the last 72 hours. CBG:  Recent Labs Lab 06/24/16 1232 06/24/16 1604 06/24/16 1709 06/24/16 2248 06/25/16 0641  GLUCAP 200* 161* 177* 237* 194*   Lipid Profile:  Recent Labs  06/25/16 0452  CHOL 131  HDL 15*  LDLCALC 86  TRIG 161  CHOLHDL 8.7   Thyroid Function Tests:  Recent Labs  06/25/16 0452  TSH 1.275   Anemia Panel: No results for input(s): VITAMINB12, FOLATE, FERRITIN, TIBC, IRON, RETICCTPCT in the last 72 hours. Sepsis Labs:  Recent Labs Lab 06/22/16 1457 06/22/16 1846 06/23/16 0350  LATICACIDVEN 2.01* 1.99* 0.9    Recent Results (from the past 240 hour(s))  Culture, blood (Routine x 2)     Status: None  (Preliminary result)   Collection Time: 06/22/16  2:47 PM  Result Value Ref Range Status   Specimen Description BLOOD LEFT ARM  Final   Special Requests BOTTLES DRAWN AEROBIC AND ANAEROBIC 5CC  Final   Culture NO GROWTH 3 DAYS  Final   Report Status PENDING  Incomplete  Culture, blood (Routine x 2)     Status: None (Preliminary result)   Collection Time: 06/22/16  6:02 PM  Result Value Ref Range Status   Specimen Description BLOOD RIGHT HAND  Final   Special Requests BOTTLES DRAWN AEROBIC ONLY 5CC  Final   Culture NO GROWTH 3 DAYS  Final   Report Status PENDING  Incomplete  MRSA PCR Screening     Status: None   Collection Time: 06/24/16  9:02 AM  Result Value Ref Range Status   MRSA by PCR NEGATIVE NEGATIVE Final    Comment:        The GeneXpert MRSA Assay (FDA approved for NASAL specimens only), is one component of a comprehensive MRSA colonization surveillance program. It is not intended to diagnose MRSA infection nor to guide or monitor treatment for MRSA infections.          Radiology Studies: No results found.      Scheduled Meds: . enoxaparin (LOVENOX) injection  40 mg Subcutaneous Q24H  . feeding supplement (GLUCERNA SHAKE)  237 mL Oral BID BM  . feeding supplement (PRO-STAT SUGAR FREE 64)  30 mL Oral Daily  . insulin aspart  0-9 Units Subcutaneous TID WC  . insulin glargine  10 Units Subcutaneous QHS  . piperacillin-tazobactam (ZOSYN)  IV  3.375 g Intravenous Q8H  . vancomycin  1,000 mg Intravenous Q12H   Continuous Infusions: . sodium chloride 10 mL/hr at 06/24/16 1848     LOS: 3 days    Time spent: 35 minutes.     Alba Coryegalado, Jurnei Latini A, MD [facility] Pager [phone]  If 7PM-7AM, please contact night-coverage [email or web address] Password [email or web address] 06/25/2016, 11:51 AM

## 2016-06-26 ENCOUNTER — Inpatient Hospital Stay (HOSPITAL_COMMUNITY): Payer: Self-pay

## 2016-06-26 DIAGNOSIS — I4891 Unspecified atrial fibrillation: Secondary | ICD-10-CM

## 2016-06-26 LAB — ECHOCARDIOGRAM COMPLETE
Height: 73 in
Weight: 4448 oz

## 2016-06-26 LAB — BASIC METABOLIC PANEL
ANION GAP: 8 (ref 5–15)
BUN: 11 mg/dL (ref 6–20)
CALCIUM: 8.9 mg/dL (ref 8.9–10.3)
CO2: 27 mmol/L (ref 22–32)
Chloride: 98 mmol/L — ABNORMAL LOW (ref 101–111)
Creatinine, Ser: 1.13 mg/dL (ref 0.61–1.24)
GLUCOSE: 210 mg/dL — AB (ref 65–99)
POTASSIUM: 4.2 mmol/L (ref 3.5–5.1)
Sodium: 133 mmol/L — ABNORMAL LOW (ref 135–145)

## 2016-06-26 LAB — CBC
HCT: 35.6 % — ABNORMAL LOW (ref 39.0–52.0)
Hemoglobin: 12.1 g/dL — ABNORMAL LOW (ref 13.0–17.0)
MCH: 30.3 pg (ref 26.0–34.0)
MCHC: 34 g/dL (ref 30.0–36.0)
MCV: 89.2 fL (ref 78.0–100.0)
PLATELETS: 244 10*3/uL (ref 150–400)
RBC: 3.99 MIL/uL — AB (ref 4.22–5.81)
RDW: 12.7 % (ref 11.5–15.5)
WBC: 10 10*3/uL (ref 4.0–10.5)

## 2016-06-26 LAB — GLUCOSE, CAPILLARY
GLUCOSE-CAPILLARY: 215 mg/dL — AB (ref 65–99)
GLUCOSE-CAPILLARY: 184 mg/dL — AB (ref 65–99)
GLUCOSE-CAPILLARY: 217 mg/dL — AB (ref 65–99)
Glucose-Capillary: 219 mg/dL — ABNORMAL HIGH (ref 65–99)

## 2016-06-26 MED ORDER — INSULIN GLARGINE 100 UNIT/ML ~~LOC~~ SOLN
15.0000 [IU] | Freq: Every day | SUBCUTANEOUS | Status: DC
  Administered 2016-06-26: 15 [IU] via SUBCUTANEOUS
  Filled 2016-06-26 (×2): qty 0.15

## 2016-06-26 NOTE — Progress Notes (Signed)
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  Other dx: DM, a-fib.     PT Comments    Patient continues to progress with mobility.  Able to ambulate a good distance maintaining NWB LLE.  Only concern is still up/down stairs.  If patient can negotiate stairs, he can discharge home with intermittent supervision.  Dr. Lajoyce Cornersuda - may patient weight bear on heel for up/down stairs?   Follow Up Recommendations  SNF;Home health PT (HH if he can manage stairs)     Equipment Recommendations  Standard walker    Recommendations for Other Services       Precautions / Restrictions Precautions Precautions: Fall Restrictions Weight Bearing Restrictions: Yes LLE Weight Bearing: Non weight bearing    Mobility  Bed Mobility Overal bed mobility: Modified Independent             General bed mobility comments: uses railing  Transfers Overall transfer level: Modified independent Equipment used: Rolling walker (2 wheeled) Transfers: Sit to/from Stand Sit to Stand: Modified independent (Device/Increase time)            Ambulation/Gait Ambulation/Gait assistance: Supervision Ambulation Distance (Feet): 200 Feet Assistive device: Rolling walker (2 wheeled)   Gait velocity: decreased   General Gait Details: Hopping on Right foot.  Patient required several rest breaks during gait.  No apparent loss of balance, very steady during gait; able to maintain NWB throughout gait.  Patient does report he prefers a standard walker.   Stairs            Wheelchair Mobility    Modified Rankin (Stroke Patients Only)       Balance             Standing balance-Leahy Scale: Poor Standing balance comment: reliant on RW due to NWB LLE                    Cognition  Arousal/Alertness: Awake/alert Behavior During Therapy: WFL for tasks assessed/performed Overall Cognitive Status: Within Functional Limits for tasks assessed                      Exercises      General Comments        Pertinent Vitals/Pain Pain Assessment: No/denies pain    Home Living                      Prior Function            PT Goals (current goals can now be found in the care plan section) Progress towards PT goals: Progressing toward goals    Frequency    Min 3X/week      PT Plan Current plan remains appropriate    Co-evaluation             End of Session Equipment Utilized During Treatment: Gait belt Activity Tolerance: Patient tolerated treatment well Patient left: in bed;with call bell/phone within reach     Time: 0932-1002 PT Time Calculation (min) (ACUTE ONLY): 30 min  Charges:  $Gait Training: 23-37 mins                    G Codes:      Paul Mclaughlin, Paul Mclaughlin 06/26/2016, 10:10 AM  06/26/2016 Paul Mclaughlin, PT [phone]

## 2016-06-26 NOTE — Progress Notes (Signed)
  Echocardiogram 2D Echocardiogram has been performed.  Paul Mclaughlin, Paul Mclaughlin 06/26/2016, 12:32 PM

## 2016-06-26 NOTE — NC FL2 (Signed)
[place] MEDICAID FL2 LEVEL OF CARE SCREENING TOOL     IDENTIFICATION  Patient Name: Paul GamesKenneth W Cullen Birthdate: 11-21-1955 Sex: male Admission Date (Current Location): 06/22/2016  [facility] [place]   Facility and Address:  [facility], [address], [place], [place] [address]      Provider Number: [id number]  Attending Physician Name and Address:  Alba CoryBelkys A Regalado, MD  Relative Name and Phone Number:       Current Level of Care: Hospital Recommended Level of Care: Skilled Nursing Facility Prior Approval Number:    Date Approved/Denied:   PASRR Number:  [id number])  Discharge Plan: SNF    Current Diagnoses: Patient Active Problem List   Diagnosis Date Noted  . Cellulitis of left foot 06/22/2016  . Controlled type 2 diabetes mellitus with hyperglycemia (HCC) 06/22/2016    Orientation RESPIRATION BLADDER Height & Weight     Self, Time, Situation, Place  Normal Continent Weight: 278 lb (126.1 kg) Height:  6\' 1"  (185.4 cm)  BEHAVIORAL SYMPTOMS/MOOD NEUROLOGICAL BOWEL NUTRITION STATUS   (none)  (none) Continent  (Carb Modified)  AMBULATORY STATUS COMMUNICATION OF NEEDS Skin   Extensive Assist Verbally Surgical wounds                       Personal Care Assistance Level of Assistance  Bathing, Feeding, Dressing Bathing Assistance: Limited assistance Feeding assistance: Independent Dressing Assistance: Limited assistance     Functional Limitations Info  Sight, Hearing, Speech Sight Info: Adequate Hearing Info: Adequate Speech Info: Adequate    SPECIAL CARE FACTORS FREQUENCY  PT (By licensed PT)     PT Frequency:  (5x/wee)              Contractures Contractures Info: Not present    Additional Factors Info  Code Status, Allergies Code Status Info:  (full) Allergies Info:  (NKDA)           Current Medications (06/26/2016):  This is the current hospital active medication list Current  Facility-Administered Medications  Medication Dose Route Frequency Provider Last Rate Last Dose  . 0.9 %  sodium chloride infusion   Intravenous Continuous Nadara MustardMarcus V Duda, MD 10 mL/hr at 06/24/16 1848    . acetaminophen (TYLENOL) tablet 650 mg  650 mg Oral Q6H PRN Nadara MustardMarcus V Duda, MD       Or  . acetaminophen (TYLENOL) suppository 650 mg  650 mg Rectal Q6H PRN Nadara MustardMarcus V Duda, MD      . feeding supplement (GLUCERNA SHAKE) (GLUCERNA SHAKE) liquid 237 mL  237 mL Oral BID BM Belkys A Regalado, MD   237 mL at 06/25/16 1000  . feeding supplement (PRO-STAT SUGAR FREE 64) liquid 30 mL  30 mL Oral Daily Belkys A Regalado, MD      . hydrALAZINE (APRESOLINE) injection 10 mg  10 mg Intravenous Q8H PRN Belkys A Regalado, MD      . HYDROmorphone (DILAUDID) injection 1 mg  1 mg Intravenous Q2H PRN Nadara MustardMarcus V Duda, MD      . insulin aspart (novoLOG) injection 0-9 Units  0-9 Units Subcutaneous TID WC Eduard ClosArshad N Kakrakandy, MD   2 Units at 06/26/16 1258  . insulin glargine (LANTUS) injection 15 Units  15 Units Subcutaneous QHS Belkys A Regalado, MD      . lisinopril (PRINIVIL,ZESTRIL) tablet 10 mg  10 mg Oral Daily Belkys A Regalado, MD   10 mg at 06/26/16 1024  . methocarbamol (ROBAXIN)  tablet 500 mg  500 mg Oral Q6H PRN [name], MD   500 mg at 06/26/16 0406   Or  . methocarbamol (ROBAXIN) 500 mg in dextrose 5 % 50 mL IVPB  500 mg Intravenous Q6H PRN [name], MD      . metoCLOPramide (REGLAN) tablet 5-10 mg  5-10 mg Oral Q8H PRN [name], MD       Or  . metoCLOPramide (REGLAN) injection 5-10 mg  5-10 mg Intravenous Q8H PRN [name], MD      . ondansetron [facility]) tablet 4 mg  4 mg Oral Q6H PRN [name], MD       Or  . ondansetron [facility]) injection 4 mg  4 mg Intravenous Q6H PRN [name], MD      . oxyCODONE (Oxy IR/ROXICODONE) immediate release tablet 5-10 mg  5-10 mg Oral Q3H PRN [name], MD   10 mg at 06/26/16 0810  . piperacillin-tazobactam (ZOSYN) IVPB 3.375 g  3.375 g  Intravenous Q8H Justin R Crowder, Student-PharmD   3.375 g at 06/26/16 1258  . rivaroxaban (XARELTO) tablet 20 mg  20 mg Oral Q supper Lynita Lombard Norris, RPH   20 mg at 06/25/16 1705  . vancomycin (VANCOCIN) IVPB 1000 mg/200 mL premix  1,000 mg Intravenous Q12H Armandina Stammer, RPH   1,000 mg at 06/26/16 1610     Discharge Medications: Please see discharge summary for a list of discharge medications.  Relevant Imaging Results:  Relevant Lab Results:   Additional Information [id number]  Terald Sleeper, LCSW

## 2016-06-26 NOTE — Progress Notes (Signed)
PROGRESS NOTE    Paul Mclaughlin  ZOX:[id number] DOB: 12-Feb-1956 DOA: 06/22/2016 PCP: Pcp Not In System   Brief Narrative: Paul Mclaughlin is a 60 y.o. male recently diagnosed with diabetes mellitus. He noticed a blister on his left foot, small toe a couple of weeks ago, which gradually worsened. Last weekend around 6 days ago he noticed discoloration and last Monday (3 days ago) he went to his physicians office. At his physicians office he was noted to have a hemoglobin A1C of 10.1. He was started on an antibiotic for left foot cellulitis and metformin for diabetes mellitus. Despite, taking this antibiotic his foot swelling and erythema started moving proximally and there was discharge from the blister. On presentation to the ER patient was tachycardic and mildly febrile with elevate lactate. Xray doesn't show any bone involvment. Patient is being admitted for cellulitis with possible developing sepsis. On exam patient has erythema and swelling extending from the toes to the ankle joint. Patient has an ulceration of the dorsal aspect of the left foot small toe with discharge and mild sloughing. While in the ED, patient was given fluid bolus started on vancomycin and zosyn after blood cultures were obtained.    ED Course: Sodium 132, glucose 222, lactic acid 1.99, WBC 18.2 While in the ED patient was given fluid bolus started on vancomycin and zosyn.    Assessment & Plan:   Principal Problem:   Cellulitis of left foot Active Problems:   Controlled type 2 diabetes mellitus with hyperglycemia (HCC)   1-Left foot cellulitis, small toe phalanges with possible Osteomyelitis , abscess.  MRI Small Toe Phalanges suspicious for Osteomyelitis Small abscess or phlegmon dorsally in the small toe. Diffuse confluent edema along the ankle and tracking along the dorsum of the foot and along the ball of the foot, suspicious for possible cellulitis. Continue with IV Vancomycin and Zosyn . Day 4.  Ortho  consulted.  WBC trending down 18---12--9 S/P  fifth ray amputation  And Local tissue rearrangement for wound closure 10 x 3 cm  2-DM, HbA1c at 10. Continue with lantus. Increase to 15 units.  He will probably need insulin at discharge/.   3-HTN; Hydralazine ordered prn.  Started  lisinopril.   4-Persistent A fib;  Rated controlled.  Started on Xarelto. CM to helps with medications  Follow ECHO. pending LDL 86.  TSH 1.2.   DVT prophylaxis: lovenox Code Status: full code.  Family Communication: care discussed with patient  Disposition Plan: needs to be on IV antibiotics for over the weekend. Awaiting PT recommendation for disposition.   Consultants:   ortho   Procedures:     Antimicrobials:   Vancomycin , zosyn     Subjective: Having some nauseas. Does not wants anything for it.   Objective: Vitals:   06/25/16 0500 06/25/16 1304 06/25/16 2121 06/26/16 0354  BP: (!) 166/79 136/77 (!) 155/85 (!) 146/71  Pulse: 76 81 64 (!) 55  Resp: 18 17 18 18   Temp: 98.4 F (36.9 C)  98.5 F (36.9 C) 97.7 F (36.5 C)  TempSrc: Oral  Oral Oral  SpO2: 95% 97% 98% 97%  Weight:      Height:        Intake/Output Summary (Last 24 hours) at 06/26/16 1105 Last data filed at 06/26/16 0810  Gross per 24 hour  Intake             1873 ml  Output  0 ml  Net             1873 ml   Filed Weights   06/22/16 1420  Weight: 126.1 kg (278 lb)    Examination:  General exam: Appears calm and comfortable  Respiratory system: Clear to auscultation. Respiratory effort normal. Cardiovascular system: S1 & S2 heard, RRR. No JVD, murmurs, rubs, gallops or clicks. No pedal edema. Gastrointestinal system: Abdomen is nondistended, soft and nontender. No organomegaly or masses felt. Normal bowel sounds heard. Central nervous system: Alert and oriented. No focal neurological deficits. Extremities: Symmetric 5 x 5 power. Skin: left LE foot with clean dressing.  Psychiatry:  Judgement and insight appear normal. Mood & affect appropriate.     Data Reviewed: I have personally reviewed following labs and imaging studies  CBC:  Recent Labs Lab 06/22/16 1423 06/23/16 0350 06/25/16 0452 06/26/16 0309  WBC 18.2* 12.3* 9.1 10.0  NEUTROABS 15.4*  --   --   --   HGB 15.2 12.7* 12.7* 12.1*  HCT 43.3 36.1* 37.5* 35.6*  MCV 89.6 88.3 89.5 89.2  PLT 281 250 229 244   Basic Metabolic Panel:  Recent Labs Lab 06/22/16 1423 06/23/16 0350 06/24/16 1036 06/25/16 0452 06/26/16 0309  NA 132* 136 138 134* 133*  K 4.8 3.7 3.4* 3.6 4.2  CL 96* 104 109 100* 98*  CO2 17* 24 22 26 27   GLUCOSE 222* 191* 168* 202* 210*  BUN 13 16 6 9 11   CREATININE 1.00 0.90 0.66 0.88 1.13  CALCIUM 9.1 8.4* 7.4* 8.7* 8.9   GFR: Estimated Creatinine Clearance: 98 mL/min (by C-G formula based on SCr of 1.13 mg/dL). Liver Function Tests:  Recent Labs Lab 06/22/16 1423  AST 19  ALT 13*  ALKPHOS 67  BILITOT 2.1*  PROT 8.1  ALBUMIN 3.4*   No results for input(s): LIPASE, AMYLASE in the last 168 hours. No results for input(s): AMMONIA in the last 168 hours. Coagulation Profile: No results for input(s): INR, PROTIME in the last 168 hours. Cardiac Enzymes: No results for input(s): CKTOTAL, CKMB, CKMBINDEX, TROPONINI in the last 168 hours. BNP (last 3 results) No results for input(s): PROBNP in the last 8760 hours. HbA1C: No results for input(s): HGBA1C in the last 72 hours. CBG:  Recent Labs Lab 06/25/16 0641 06/25/16 1234 06/25/16 1624 06/25/16 2122 06/26/16 0559  GLUCAP 194* 232* 246* 232* 215*   Lipid Profile:  Recent Labs  06/25/16 0452  CHOL 131  HDL 15*  LDLCALC 86  TRIG 295  CHOLHDL 8.7   Thyroid Function Tests:  Recent Labs  06/25/16 0452  TSH 1.275   Anemia Panel: No results for input(s): VITAMINB12, FOLATE, FERRITIN, TIBC, IRON, RETICCTPCT in the last 72 hours. Sepsis Labs:  Recent Labs Lab 06/22/16 1457 06/22/16 1846 06/23/16 0350    LATICACIDVEN 2.01* 1.99* 0.9    Recent Results (from the past 240 hour(s))  Culture, blood (Routine x 2)     Status: None (Preliminary result)   Collection Time: 06/22/16  2:47 PM  Result Value Ref Range Status   Specimen Description BLOOD LEFT ARM  Final   Special Requests BOTTLES DRAWN AEROBIC AND ANAEROBIC 5CC  Final   Culture NO GROWTH 3 DAYS  Final   Report Status PENDING  Incomplete  Culture, blood (Routine x 2)     Status: None (Preliminary result)   Collection Time: 06/22/16  6:02 PM  Result Value Ref Range Status   Specimen Description BLOOD RIGHT HAND  Final  Special Requests BOTTLES DRAWN AEROBIC ONLY 5CC  Final   Culture NO GROWTH 3 DAYS  Final   Report Status PENDING  Incomplete  MRSA PCR Screening     Status: None   Collection Time: 06/24/16  9:02 AM  Result Value Ref Range Status   MRSA by PCR NEGATIVE NEGATIVE Final    Comment:        The GeneXpert MRSA Assay (FDA approved for NASAL specimens only), is one component of a comprehensive MRSA colonization surveillance program. It is not intended to diagnose MRSA infection nor to guide or monitor treatment for MRSA infections.          Radiology Studies: No results found.      Scheduled Meds: . feeding supplement (GLUCERNA SHAKE)  237 mL Oral BID BM  . feeding supplement (PRO-STAT SUGAR FREE 64)  30 mL Oral Daily  . insulin aspart  0-9 Units Subcutaneous TID WC  . insulin glargine  13 Units Subcutaneous QHS  . lisinopril  10 mg Oral Daily  . piperacillin-tazobactam (ZOSYN)  IV  3.375 g Intravenous Q8H  . rivaroxaban  20 mg Oral Q supper  . vancomycin  1,000 mg Intravenous Q12H   Continuous Infusions: . sodium chloride 10 mL/hr at 06/24/16 1848     LOS: 4 days    Time spent: 35 minutes.     Alba Coryegalado, Jazmynn Pho A, MD [facility] Pager [phone]  If 7PM-7AM, please contact night-coverage [email or web address] Password [email or web address] 06/26/2016, 11:05 AM

## 2016-06-27 ENCOUNTER — Encounter (HOSPITAL_COMMUNITY): Payer: Self-pay | Admitting: Orthopedic Surgery

## 2016-06-27 LAB — CULTURE, BLOOD (ROUTINE X 2)
CULTURE: NO GROWTH
Culture: NO GROWTH

## 2016-06-27 LAB — GLUCOSE, CAPILLARY
GLUCOSE-CAPILLARY: 192 mg/dL — AB (ref 65–99)
Glucose-Capillary: 182 mg/dL — ABNORMAL HIGH (ref 65–99)

## 2016-06-27 MED ORDER — INSULIN GLARGINE 100 UNIT/ML ~~LOC~~ SOLN: 15.0000 [IU] | Freq: Every day | SUBCUTANEOUS | 11 refills | Status: DC

## 2016-06-27 MED ORDER — LISINOPRIL 10 MG PO TABS: 10.0000 mg | ORAL_TABLET | Freq: Every day | ORAL | 0 refills | Status: DC

## 2016-06-27 MED ORDER — BLOOD GLUCOSE MONITOR KIT: PACK | 0 refills | Status: DC

## 2016-06-27 MED ORDER — OXYCODONE HCL 5 MG PO TABS: 5.0000 mg | ORAL_TABLET | Freq: Four times a day (QID) | ORAL | 0 refills | Status: DC | PRN

## 2016-06-27 MED ORDER — RIVAROXABAN 20 MG PO TABS
20.0000 mg | ORAL_TABLET | Freq: Every day | ORAL | 0 refills | Status: DC
Start: 2016-06-27 — End: 2016-07-13

## 2016-06-27 MED ORDER — AMLODIPINE BESYLATE 5 MG PO TABS
5.0000 mg | ORAL_TABLET | Freq: Every day | ORAL | Status: DC
  Administered 2016-06-27: 5 mg via ORAL
  Filled 2016-06-27: qty 1

## 2016-06-27 MED ORDER — AMLODIPINE BESYLATE 5 MG PO TABS: 5.0000 mg | ORAL_TABLET | Freq: Every day | ORAL | 0 refills | Status: DC

## 2016-06-27 MED ORDER — GLUCERNA SHAKE PO LIQD: 237.0000 mL | Freq: Two times a day (BID) | ORAL | 0 refills | Status: DC

## 2016-06-27 MED FILL — TRUE METRIX TEST STRIP: 30 days supply | Qty: 100 | Fill #0

## 2016-06-27 MED FILL — LANTUS 100 UNITS/ML VIAL: 100 | 28 days supply | Qty: 10 | Fill #0

## 2016-06-27 MED FILL — TRUE METRIX BLOOD GLUCOSE M: W/DEVICE | 1 days supply | Qty: 1 | Fill #0

## 2016-06-27 MED FILL — AMLODIPINE BESYLATE 5 MG TA: 5 | 30 days supply | Qty: 30 | Fill #0

## 2016-06-27 MED FILL — LISINOPRIL 10 MG TABLET: 10 | 30 days supply | Qty: 30 | Fill #0

## 2016-06-27 MED FILL — TRUEplus LANCETS 28G MISC: 30 days supply | Qty: 100 | Fill #0

## 2016-06-27 MED FILL — XARELTO 20 MG TABLET: 20 | 30 days supply | Qty: 30 | Fill #0

## 2016-06-27 NOTE — Progress Notes (Signed)
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  Other dx: DM, a-fib.     PT Comments    PT able to ambulate up/down 2 stairs with rail and min guard assistance with WBAT through Lt heel. Pt reports feeling confident with getting into his home. Pt also ambulating 100 ft with standard walker modified independent. Following session, pt denies any questions or concerns.   Follow Up Recommendations  Home health PT;Supervision - Intermittent     Equipment Recommendations  Standard walker    Recommendations for Other Services       Precautions / Restrictions Precautions Precautions: Fall Required Braces or Orthoses: Other Brace/Splint Other Brace/Splint: post-op shoe Restrictions Weight Bearing Restrictions: Yes LLE Weight Bearing: Non weight bearing (WBAT through heel for stairs to enter home )    Mobility  Bed Mobility Overal bed mobility: Independent                Transfers Overall transfer level: Modified independent Equipment used: Standard walker Transfers: Sit to/from Stand Sit to Stand: Modified independent (Device/Increase time)         General transfer comment: good technique from bed and chair  Ambulation/Gait Ambulation/Gait assistance: Modified independent (Device/Increase time) Ambulation Distance (Feet): 100 Feet Assistive device: Standard walker Gait Pattern/deviations:  (hop-to pattern) Gait velocity: decreased       Stairs Stairs: Yes Stairs assistance: Min guard Stair Management: One rail Right;Step to pattern;Sideways Number of Stairs: 2 General stair comments: PT reports feeling confident with going up/down stairs. Pt denies needing/wanting to attempt any more stairs for practice.   Wheelchair Mobility    Modified Rankin (Stroke Patients  Only)       Balance Overall balance assessment: Needs assistance Sitting-balance support: No upper extremity supported Sitting balance-Leahy Scale: Normal     Standing balance support: No upper extremity supported Standing balance-Leahy Scale: Poor Standing balance comment: using rw for support                    Cognition Arousal/Alertness: Awake/alert Behavior During Therapy: WFL for tasks assessed/performed Overall Cognitive Status: Within Functional Limits for tasks assessed                      Exercises      General Comments        Pertinent Vitals/Pain Pain Assessment: Faces Faces Pain Scale: Hurts little more Pain Location: Lt foot Pain Descriptors / Indicators: Aching Pain Intervention(s): Limited activity within patient's tolerance;Monitored during session    Home Living                      Prior Function            PT Goals (current goals can now be found in the care plan section) Acute Rehab PT Goals Patient Stated Goal: To go home PT Goal Formulation: With patient Time For Goal Achievement: 07/09/16 Potential to Achieve Goals: Good Progress towards PT goals: Progressing toward goals    Frequency    Min 3X/week      PT Plan Current plan remains appropriate    Co-evaluation             End of Session Equipment Utilized During Treatment: Gait belt Activity Tolerance: Patient tolerated treatment well Patient left: in bed;with  call bell/phone within reach (declined sitting up in chair)     Time: 1027-25360825-0904 PT Time Calculation (min) (ACUTE ONLY): 39 min  Charges:  $Gait Training: 38-52 mins                    G Codes:      Christiane HaBenjamin J. Sorayah Schrodt, PT, CSCS Pager [phone] Office [phone]  06/27/2016, 9:10 AM

## 2016-06-27 NOTE — Hospital Discharge Follow-Up (Signed)
Met with the patient at the request of Ricki Miller, RN CM to discuss scheduling a hospital follow up appointment. The patient is on xarelto and will be having his prescriptions filled at [facility]. An appointment was scheduled for him for 06/30/16 @  0930.  He said that he will need to have a friend drive him to the clinic for his appointment. His friend is transporting him home and will take him to [facility] to pick up his prescriptions at the pharmacy.  He said that he has applied for food stamps and needs to apply for medicaid. He inquired about applying for disability and the phone # for social security was provided to him. Informed him about the Financial Counseling available at the [facility] and the assistance that is available to apply for the [employer] and [employer].  Also reviewed the Pharmacy assistance that is available to help with the cost of his medications. Reminded him that it is important to keep the appointment on 06/30/16 and to call the clinic if he needs to cancel or reschedule the appointment.   Update provided to Ricki Miller, RN CM .

## 2016-06-27 NOTE — Progress Notes (Signed)
Patient ID: Paul Mclaughlin, male   DOB: [birth year], 60 y.o.   MRN: [id number] Patient with difficulty getting up and down stairs. Okay for weightbearing as tolerated on the heel to go up and down stairs. Discussed the importance of minimizing weightbearing on his left foot. Discussed that with 2 much weightbearing the wound could break open and he would require additional surgery. Okay for discharge once safe with therapy. Will change the dressing in the office.

## 2016-06-27 NOTE — Care Management Note (Signed)
Case Management Note  Patient Details  Name: Paul Mclaughlin MRN: [id number] Date of Birth: 08-23-1956  Subjective/Objective:    60 yr old gentleman s/p amputation of left foot fifth ray. Ne onset afib and diabetes.                 Action/Plan:  Case manager spoke with patient concerning his need for medication assistance. He is being started on Xarelton 20mg . Patient is uninsured. CM provided patient with Xarelto 3o day free card and the application for medication assistance. [facility], Faxed patient's prescriptions and [facility]. CM explained to patient there may be a slight cost for the remainder of his medications, [facility]. CM informed patient that he will be contacted on his cell when [facility]. Patient's number is [phone].  Expected Discharge Date:    06/27/16             Expected Discharge Plan:   Home  In-House Referral:     Discharge planning Services  CM Consult, Indigent Health Clinic  Post Acute Care Choice:  Durable Medical Equipment Choice offered to:  Patient  DME Arranged:  Walker rolling DME Agency:  [facility]  HH Arranged:  NA HH Agency:  NA  Status of Service:  Completed, signed off  If discussed at Long Length of Stay Meetings, dates discussed:    Additional Comments:  Durenda GuthrieBrady, Devron Cohick Naomi, RN 06/27/2016, 11:54 AM

## 2016-06-27 NOTE — Care Management (Signed)
Case manager spoke with Erskine SquibbJane ,[facility] with [facility] concerning [facility]. She will see patient and get him an appointment as soon as possible.  CM will not need to f/u on Wednesday.

## 2016-06-27 NOTE — Discharge Instructions (Signed)

## 2016-06-27 NOTE — Discharge Summary (Signed)
Physician Discharge Summary  Paul Mclaughlin DYJ:[id number] DOB: Aug 27, 1956 DOA: 06/22/2016  PCP: Pcp Not In System  Admit date: 06/22/2016 Discharge date: 06/27/2016  Admitted From: Home  Disposition:  Home   Recommendations for Outpatient Follow-up:  1. Follow up with PCP in 1-2 weeks 2. Please obtain BMP/CBC in one week 3. Needs to follow up with cardiology for further care of A fib.  4. Needs further adjustment of Diabetes regimen.  5. Follow up with Paul Mclaughlin for post op care.   Home Health: yes  Equipment/Devices: Walker.   Discharge Condition: Stable.  CODE STATUS: Full code.  Diet recommendation: Heart Healthy / Carb Modified  Brief/Interim Summary: Paul Mclaughlin a 60 y.o.malerecently diagnosed with diabetes mellitus. He noticed ablister on his left foot, small toe a couple of weeks ago, which gradually worsened. Last weekend around 6 days ago he noticed discoloration and last Monday (3 days ago) he went to his physicians office. At his physicians office he was noted to have a hemoglobinA1C of 10.1. He was started on an antibiotic for left foot cellulitis and metformin for diabetes mellitus. Despite, taking this antibiotic hisfoot swelling and erythema started moving proximally and there was discharge from the blister. On presentation to the ER patientwas tachycardic and mildly febrile with elevatelactate. Xray doesn't show any bone involvment. Patientis being admitted for cellulitis with possible developing sepsis. On exam patient haserythema and swelling extending from the toes to the ankle joint. Patient has an ulceration of the dorsal aspect of the left foot small toe with dischargeand mild sloughing. Whilein the ED, patient was Mclaughlin fluid bolus started on vancomycin and zosynafter blood cultures were obtained.   ED Course:Sodium 132, glucose 222, lactic acid 1.99, WBC 18.2 Whilein the ED patient was Mclaughlin fluid bolus started on vancomycin and zosyn.     Assessment & Plan:  1-Left foot cellulitis, small toe phalanges with possible Osteomyelitis , abscess.  MRI Small Toe Phalanges suspicious for Osteomyelitis Small abscess or phlegmon dorsally in the small toe. Diffuse confluent edema along the ankle and tracking along the dorsum of the foot and along the ball of the foot, suspicious for possible cellulitis. Received  IV Vancomycin and Zosyn  For 5 days.  Ortho consulted. Paul Mclaughlin.  WBC trending down 18---12--9 S/P  fifth ray amputation  And Local tissue rearrangement for wound closure 10 x 3 cm discussed with Paul Mclaughlin ok to discharge home today, no need for antibiotics at discharge.   2-DM, HbA1c at 10. Continue with lantus. Increase to 15 units.  Discharge on insulin. Resume metformin also.   3-HTN; Hydralazine ordered prn.  Started  lisinopril.  Will also add Norvasc, low dose.   4-Persistent A fib;  Rated controlled.  Started on Xarelto. CM to helps with medications  Follow ECHO normal Ef LDL 86.  TSH 1.2.   Discharge Diagnoses:  Principal Problem:   Cellulitis of left foot Active Problems:   Controlled type 2 diabetes mellitus with hyperglycemia [facility])    Discharge Instructions  Discharge Instructions    Diet - low sodium heart healthy    Complete by:  As directed    Increase activity slowly    Complete by:  As directed        Medication List    STOP taking these medications   ciprofloxacin 750 MG tablet Commonly known as:  CIPRO   metroNIDAZOLE 500 MG tablet Commonly known as:  FLAGYL     TAKE these medications   amLODipine  5 MG tablet Commonly known as:  NORVASC Take 1 tablet (5 mg total) by mouth daily.   blood glucose meter kit and supplies Kit Dispense based on patient and insurance preference. Use up to four times daily as directed. (FOR ICD-9 250.00, 250.01).   feeding supplement (GLUCERNA SHAKE) Liqd Take 237 mLs by mouth 2 (two) times daily between meals.   insulin glargine 100  UNIT/ML injection Commonly known as:  LANTUS Inject 0.15 mLs (15 Units total) into the skin at bedtime.   lisinopril 10 MG tablet Commonly known as:  PRINIVIL,ZESTRIL Take 1 tablet (10 mg total) by mouth daily.   metFORMIN 500 MG tablet Commonly known as:  GLUCOPHAGE Take 500 mg by mouth 2 (two) times daily with a meal.   oxyCODONE 5 MG immediate release tablet Commonly known as:  Oxy IR/ROXICODONE Take 1-2 tablets (5-10 mg total) by mouth every 6 (six) hours as needed for breakthrough pain.   rivaroxaban 20 MG Tabs tablet Commonly known as:  XARELTO Take 1 tablet (20 mg total) by mouth daily with supper.      Follow-up Information    Paul Minion, MD Follow up in 1 week(s).   Specialty:  Orthopedic Surgery Contact information: [place] [address] [phone]        Paul Breeding, MD Follow up in 2 week(s).   Specialty:  Cardiology Contact information: [address] [place] [address] [phone]          No Known Allergies  Consultations:  Cardiology  Paul Mclaughlin.    Procedures/Studies: Dg Chest 2 View  Result Date: 06/22/2016 CLINICAL DATA:  Left toe wound. EXAM: CHEST  2 VIEW COMPARISON:  None. FINDINGS: The heart size and mediastinal contours are within normal limits. Both lungs are clear. No pneumothorax or pleural effusion is noted. Old left clavicular fracture is noted. IMPRESSION: No active cardiopulmonary disease. Electronically Signed   By: Marijo Conception, M.D.   On: 06/22/2016 15:09   Mr Foot Left Wo Contrast  Result Date: 06/23/2016 CLINICAL DATA:  Swallow left foot. Blistering. Assessment for osteomyelitis. EXAM: MRI OF THE LEFT FOOT WITHOUT CONTRAST TECHNIQUE: Multiplanar, multisequence MR imaging was performed. No intravenous contrast was administered. COMPARISON:  06/22/2016 FINDINGS: Osteomyelitis protocol MRI of the foot was obtained, to include the entire foot and ankle. This protocol uses a large  field of view to cover the entire foot and ankle, and is suitable for assessing bony structures for osteomyelitis. Due to the large field of view and imaging plane choice, this protocol is less sensitive for assessing small structures such as ligamentous structures of the foot and ankle, compared to a dedicated forefoot or dedicated hindfoot exam. Diffuse confluent edema is noted along the ankle, especially medially, and tracking in the dorsum of the foot into the toes. There is edema in the plantar musculature of the foot and a lesser amount of edema in the distal plantar foot subcutaneous tissues. This appearance could certainly represent cellulitis. Abnormal and thickened medial band of the plantar fascia compatible with plantar fasciitis or partial tear. Plantar and Achilles calcaneal spurs of the calcaneus noted. There is abnormal edema signal in the phalanges of the small toe suspicious for osteomyelitis in this setting. Dorsally in the small toe there is a 3.7 by 1.4 by 0.8 cm (volume = 2 cm^3) high T2 signal region suspicious for abscess or phlegmon. The Degenerative findings along the Lisfranc joint. IMPRESSION: 1. Abnormal marrow edema in the  small toe phalanges suspicious for osteomyelitis. Small abscess or phlegmon dorsally in the small toe. 2. Diffuse confluent edema along the ankle and tracking along the dorsum of the foot and along the ball of the foot, suspicious for possible cellulitis. 3. Edema at the plantar musculature of the foot, probably from myositis. The gas is identified tracking in the soft tissues of the left foot. Electronically Signed   By: Van Clines M.D.   On: 06/23/2016 07:58   Dg Foot Complete Left  Result Date: 06/22/2016 CLINICAL DATA:  Left foot wound. EXAM: LEFT FOOT - COMPLETE 3+ VIEW COMPARISON:  None. FINDINGS: There is no evidence of fracture or dislocation. There is no evidence of arthropathy. Moderate spurring of posterior calcaneus is noted. Soft tissues are  unremarkable. IMPRESSION: Moderate posterior calcaneal spurring. No acute abnormality seen in the left foot. Electronically Signed   By: Marijo Conception, M.D.   On: 06/22/2016 15:12      Subjective: Feeling well, pain controlled.   Discharge Exam: Vitals:   06/26/16 2112 06/27/16 0635  BP: (!) 151/65 (!) 189/74  Pulse: (!) 56 87  Resp: 18 16  Temp: 98.8 F (37.1 C) 97.4 F (36.3 C)   Vitals:   06/26/16 0354 06/26/16 1521 06/26/16 2112 06/27/16 0635  BP: (!) 146/71 (!) 144/79 (!) 151/65 (!) 189/74  Pulse: (!) 55 66 (!) 56 87  Resp: _0 Temp: 97.7 F (36.5 C) 98 F (36.7 C) 98.8 F (37.1 C) 97.4 F (36.3 C)  TempSrc: Oral Oral Oral Oral  SpO2: 97% 98% 97% 95%  Weight:      Height:        General: Pt is alert, awake, not in acute distress Cardiovascular: RRR, S1/S2 +, no rubs, no gallops Respiratory: CTA bilaterally, no wheezing, no rhonchi Abdominal: Soft, NT, ND, bowel sounds + Extremities: no edema, no cyanosis, clean dressing left foot./     The results of significant diagnostics from this hospitalization (including imaging, microbiology, ancillary and laboratory) are listed below for reference.     Microbiology: Recent Results (from the past 240 hour(s))  Culture, blood (Routine x 2)     Status: None (Preliminary result)   Collection Time: 06/22/16  2:47 PM  Result Value Ref Range Status   Specimen Description BLOOD LEFT ARM  Final   Special Requests BOTTLES DRAWN AEROBIC AND ANAEROBIC 5CC  Final   Culture NO GROWTH 4 DAYS  Final   Report Status PENDING  Incomplete  Culture, blood (Routine x 2)     Status: None (Preliminary result)   Collection Time: 06/22/16  6:02 PM  Result Value Ref Range Status   Specimen Description BLOOD RIGHT HAND  Final   Special Requests BOTTLES DRAWN AEROBIC ONLY 5CC  Final   Culture NO GROWTH 4 DAYS  Final   Report Status PENDING  Incomplete  MRSA PCR Screening     Status: None   Collection Time: 06/24/16  9:02 AM   Result Value Ref Range Status   MRSA by PCR NEGATIVE NEGATIVE Final    Comment:        The GeneXpert MRSA Assay (FDA approved for NASAL specimens only), is one component of a comprehensive MRSA colonization surveillance program. It is not intended to diagnose MRSA infection nor to guide or monitor treatment for MRSA infections.      Labs: BNP (last 3 results) No results for input(s): BNP in the last 8760 hours. Basic Metabolic Panel:  Recent Labs  Lab 06/22/16 1423 06/23/16 0350 06/24/16 1036 06/25/16 0452 06/26/16 0309  NA 132* 136 138 134* 133*  K 4.8 3.7 3.4* 3.6 4.2  CL 96* 104 109 100* 98*  CO2 17* _0 GLUCOSE 222* 191* 168* 202* 210*  BUN _1 CREATININE 1.00 0.90 0.66 0.88 1.13  CALCIUM 9.1 8.4* 7.4* 8.7* 8.9   Liver Function Tests:  Recent Labs Lab 06/22/16 1423  AST 19  ALT 13*  ALKPHOS 67  BILITOT 2.1*  PROT 8.1  ALBUMIN 3.4*   No results for input(s): LIPASE, AMYLASE in the last 168 hours. No results for input(s): AMMONIA in the last 168 hours. CBC:  Recent Labs Lab 06/22/16 1423 06/23/16 0350 06/25/16 0452 06/26/16 0309  WBC 18.2* 12.3* 9.1 10.0  NEUTROABS 15.4*  --   --   --   HGB 15.2 12.7* 12.7* 12.1*  HCT 43.3 36.1* 37.5* 35.6*  MCV 89.6 88.3 89.5 89.2  PLT 281 250 229 244   Cardiac Enzymes: No results for input(s): CKTOTAL, CKMB, CKMBINDEX, TROPONINI in the last 168 hours. BNP: Invalid input(s): POCBNP CBG:  Recent Labs Lab 06/26/16 0559 06/26/16 1142 06/26/16 1628 06/26/16 2111 06/27/16 0741  GLUCAP 215* 184* 219* 217* 182*   D-Dimer No results for input(s): DDIMER in the last 72 hours. Hgb A1c No results for input(s): HGBA1C in the last 72 hours. Lipid Profile  Recent Labs  06/25/16 0452  CHOL 131  HDL 15*  LDLCALC 86  TRIG 149  CHOLHDL 8.7   Thyroid function studies  Recent Labs  06/25/16 0452  TSH 1.275   Anemia work up No results for input(s): VITAMINB12, FOLATE, FERRITIN,  TIBC, IRON, RETICCTPCT in the last 72 hours. Urinalysis No results found for: COLORURINE, APPEARANCEUR, LABSPEC, PHURINE, GLUCOSEU, HGBUR, BILIRUBINUR, KETONESUR, PROTEINUR, UROBILINOGEN, NITRITE, LEUKOCYTESUR Sepsis Labs Invalid input(s): PROCALCITONIN,  WBC,  LACTICIDVEN Microbiology Recent Results (from the past 240 hour(s))  Culture, blood (Routine x 2)     Status: None (Preliminary result)   Collection Time: 06/22/16  2:47 PM  Result Value Ref Range Status   Specimen Description BLOOD LEFT ARM  Final   Special Requests BOTTLES DRAWN AEROBIC AND ANAEROBIC 5CC  Final   Culture NO GROWTH 4 DAYS  Final   Report Status PENDING  Incomplete  Culture, blood (Routine x 2)     Status: None (Preliminary result)   Collection Time: 06/22/16  6:02 PM  Result Value Ref Range Status   Specimen Description BLOOD RIGHT HAND  Final   Special Requests BOTTLES DRAWN AEROBIC ONLY 5CC  Final   Culture NO GROWTH 4 DAYS  Final   Report Status PENDING  Incomplete  MRSA PCR Screening     Status: None   Collection Time: 06/24/16  9:02 AM  Result Value Ref Range Status   MRSA by PCR NEGATIVE NEGATIVE Final    Comment:        The GeneXpert MRSA Assay (FDA approved for NASAL specimens only), is one component of a comprehensive MRSA colonization surveillance program. It is not intended to diagnose MRSA infection nor to guide or monitor treatment for MRSA infections.      Time coordinating discharge: Over 30 minutes  SIGNED:   Elmarie Shiley, MD  [facility] 06/27/2016, 11:06 AM Pager [phone]  If 7PM-7AM, please contact night-coverage [email or web address] Password [email or web address]

## 2016-06-27 NOTE — Anesthesia Postprocedure Evaluation (Signed)
Anesthesia Post Note  Patient: Paul Mclaughlin  Procedure(s) Performed: Procedure(s) (LRB): FOOT FIFTH RAY TOE AMPUTATION (Left)  Patient location during evaluation: PACU Anesthesia Type: MAC Level of consciousness: awake and alert Pain management: pain level controlled Vital Signs Assessment: post-procedure vital signs reviewed and stable Respiratory status: spontaneous breathing, nonlabored ventilation, respiratory function stable and patient connected to nasal cannula oxygen Cardiovascular status: stable and blood pressure returned to baseline Anesthetic complications: no Comments: In Afib, undiagnosed, recently diagnosed DM. Cardiology consulted    Last Vitals:  Vitals:   06/26/16 2112 06/27/16 0635  BP: (!) 151/65 (!) 189/74  Pulse: (!) 56 87  Resp: 18 16  Temp: 37.1 C 36.3 C    Last Pain:  Vitals:   06/27/16 0635  TempSrc: Oral  PainSc:                  [name] S

## 2016-06-30 ENCOUNTER — Ambulatory Visit: Payer: Self-pay | Attending: Family Medicine | Admitting: Family Medicine

## 2016-06-30 ENCOUNTER — Encounter: Payer: Self-pay | Admitting: Family Medicine

## 2016-06-30 VITALS — BP 138/70 | HR 97 | Temp 97.6°F | Ht 73.0 in | Wt 286.2 lb

## 2016-06-30 DIAGNOSIS — S98132A Complete traumatic amputation of one left lesser toe, initial encounter: Secondary | ICD-10-CM

## 2016-06-30 DIAGNOSIS — E1169 Type 2 diabetes mellitus with other specified complication: Secondary | ICD-10-CM | POA: Insufficient documentation

## 2016-06-30 DIAGNOSIS — Z89422 Acquired absence of other left toe(s): Secondary | ICD-10-CM | POA: Insufficient documentation

## 2016-06-30 DIAGNOSIS — I482 Chronic atrial fibrillation, unspecified: Secondary | ICD-10-CM | POA: Insufficient documentation

## 2016-06-30 DIAGNOSIS — Z79899 Other long term (current) drug therapy: Secondary | ICD-10-CM | POA: Insufficient documentation

## 2016-06-30 DIAGNOSIS — E1165 Type 2 diabetes mellitus with hyperglycemia: Secondary | ICD-10-CM

## 2016-06-30 DIAGNOSIS — I10 Essential (primary) hypertension: Secondary | ICD-10-CM

## 2016-06-30 DIAGNOSIS — I4891 Unspecified atrial fibrillation: Secondary | ICD-10-CM | POA: Insufficient documentation

## 2016-06-30 DIAGNOSIS — Z794 Long term (current) use of insulin: Secondary | ICD-10-CM | POA: Insufficient documentation

## 2016-06-30 DIAGNOSIS — I480 Paroxysmal atrial fibrillation: Secondary | ICD-10-CM

## 2016-06-30 LAB — POCT GLYCOSYLATED HEMOGLOBIN (HGB A1C): Hemoglobin A1C: 9.2

## 2016-06-30 LAB — GLUCOSE, POCT (MANUAL RESULT ENTRY): POC GLUCOSE: 187 mg/dL — AB (ref 70–99)

## 2016-06-30 MED ORDER — METOPROLOL TARTRATE 50 MG PO TABS: 50.0000 mg | ORAL_TABLET | Freq: Two times a day (BID) | ORAL | 3 refills | Status: DC

## 2016-06-30 MED FILL — METOPROLOL TARTRATE 50 MG T: 50 | 30 days supply | Qty: 60 | Fill #0

## 2016-06-30 NOTE — Progress Notes (Signed)
Subjective:  Patient ID: Paul Mclaughlin, male    DOB: 31-Oct-1955  Age: 60 y.o. MRN: [id number]  CC: Hospitalization Follow-up; Hypertension; and Diabetes   HPI Paul Mclaughlin is a 60 year old male who comes into the clinic to establish care after recent hospitalization at [facility] on 06/22/16 through 06/27/16 status post left fifth ray amputation.  He had presented to the ED with cellulitis of the left fifth toe which had not responded to outpatient management; he had also been newly diagnosed with diabetes mellitus and commenced on metformin and Lantus.  MRI of the left foot revealed abnormal marrow edema in the small toe phalanges suspicious for osteomyelitis, small abscess or phlegmon dorsally in the small toe. He underwent left foot fifth ray toe amputation and 06/24/16 by Dr. Sharol Given. During his hospital course he was also commenced on Xarelto for A. Fib.  The patient informs me that he has always known he had an irregular heartbeat but had never followed up with the primary care physician. He is compliant with all his medications and does have a glucometer at home but is not using it. He is scheduled to see orthopedics on 07/04/16.  Past Medical History:  Diagnosis Date  . Cellulitis and abscess of left leg 06/2016  . Diabetes [facility])     Past Surgical History:  Procedure Laterality Date  . AMPUTATION Left 06/24/2016   Procedure: FOOT FIFTH RAY TOE AMPUTATION;  Surgeon: Newt Minion, MD;  Location: [place];  Service: Orthopedics;  Laterality: Left;  . open heart surgery     As a child.  81 years old.  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      Outpatient Medications Prior to Visit  Medication Sig Dispense Refill  . insulin glargine (LANTUS) 100 UNIT/ML injection Inject 0.15 mLs (15 Units total) into the skin at bedtime. 10 mL 11  . lisinopril (PRINIVIL,ZESTRIL) 10 MG tablet Take 1 tablet (10 mg total) by mouth daily. 30 tablet 0  . metFORMIN (GLUCOPHAGE) 500 MG tablet Take 500 mg by  mouth 2 (two) times daily with a meal.    . rivaroxaban (XARELTO) 20 MG TABS tablet Take 1 tablet (20 mg total) by mouth daily with supper. 30 tablet 0  . amLODipine (NORVASC) 5 MG tablet Take 1 tablet (5 mg total) by mouth daily. 30 tablet 0  . blood glucose meter kit and supplies KIT Dispense based on patient and insurance preference. Use up to four times daily as directed. (FOR ICD-9 250.00, 250.01). (Patient not taking: Reported on 06/30/2016) 1 each 0  . feeding supplement, GLUCERNA SHAKE, (GLUCERNA SHAKE) LIQD Take 237 mLs by mouth 2 (two) times daily between meals. (Patient not taking: Reported on 06/30/2016) 30 Can 0  . oxyCODONE (OXY IR/ROXICODONE) 5 MG immediate release tablet Take 1-2 tablets (5-10 mg total) by mouth every 6 (six) hours as needed for breakthrough pain. 30 tablet 0   No facility-administered medications prior to visit.     ROS Review of Systems  Constitutional: Negative for activity change and appetite change.  HENT: Negative for sinus pressure and sore throat.   Eyes: Negative for visual disturbance.  Respiratory: Negative for cough, chest tightness and shortness of breath.   Cardiovascular: Negative for chest pain and leg swelling.  Gastrointestinal: Negative for abdominal distention, abdominal pain, constipation and diarrhea.  Endocrine: Negative.   Genitourinary: Negative for dysuria.  Musculoskeletal: Negative for joint swelling and myalgias.       See hpi  Skin: Negative for rash.  Allergic/Immunologic: Negative.   Neurological: Negative for weakness, light-headedness and numbness.  Psychiatric/Behavioral: Negative for dysphoric mood and suicidal ideas.    Objective:  BP 138/70 (BP Location: Right Arm, Patient Position: Sitting, Cuff Size: Large)   Pulse 97   Temp 97.6 F (36.4 C) (Oral)   Ht 6' 1"  (1.854 m)   Wt 286 lb 3.2 oz (129.8 kg)   SpO2 100%   BMI 37.76 kg/m   BP/Weight 06/30/2016 06/27/2016 81/82/9937  Systolic BP 169 678 -  Diastolic  BP 70 74 -  Wt. (Lbs) 286.2 - 278  BMI 37.76 - 36.68      Physical Exam  Constitutional: He is oriented to person, place, and time. He appears well-developed and well-nourished.  Cardiovascular: Normal rate, normal heart sounds and intact distal pulses.  A regularly irregular rhythm present.  No murmur heard. Pulmonary/Chest: Effort normal and breath sounds normal. He has no wheezes. He has no rales. He exhibits no tenderness.  Abdominal: Soft. Bowel sounds are normal. He exhibits no distension and no mass. There is no tenderness.  Musculoskeletal:  Left foot wrapped in Ace and in a Cam Walker  Neurological: He is alert and oriented to person, place, and time.     Lab Results  Component Value Date   HGBA1C 9.2 06/30/2016    CLINICAL DATA:  Swallow left foot. Blistering. Assessment for osteomyelitis.  EXAM: MRI OF THE LEFT FOOT WITHOUT CONTRAST  TECHNIQUE: Multiplanar, multisequence MR imaging was performed. No intravenous contrast was administered.  COMPARISON:  06/22/2016  FINDINGS: Osteomyelitis protocol MRI of the foot was obtained, to include the entire foot and ankle. This protocol uses a large field of view to cover the entire foot and ankle, and is suitable for assessing bony structures for osteomyelitis. Due to the large field of view and imaging plane choice, this protocol is less sensitive for assessing small structures such as ligamentous structures of the foot and ankle, compared to a dedicated forefoot or dedicated hindfoot exam.  Diffuse confluent edema is noted along the ankle, especially medially, and tracking in the dorsum of the foot into the toes. There is edema in the plantar musculature of the foot and a lesser amount of edema in the distal plantar foot subcutaneous tissues. This appearance could certainly represent cellulitis.  Abnormal and thickened medial band of the plantar fascia compatible with plantar fasciitis or partial tear.  Plantar and Achilles calcaneal spurs of the calcaneus noted.  There is abnormal edema signal in the phalanges of the small toe suspicious for osteomyelitis in this setting. Dorsally in the small toe there is a 3.7 by 1.4 by 0.8 cm (volume = 2 cm^3) high T2 signal region suspicious for abscess or phlegmon. The  Degenerative findings along the Lisfranc joint.  IMPRESSION: 1. Abnormal marrow edema in the small toe phalanges suspicious for osteomyelitis. Small abscess or phlegmon dorsally in the small toe. 2. Diffuse confluent edema along the ankle and tracking along the dorsum of the foot and along the ball of the foot, suspicious for possible cellulitis. 3. Edema at the plantar musculature of the foot, probably from myositis. The gas is identified tracking in the soft tissues of the left foot.   Electronically Signed   By: Van Clines M.D.   On: 06/23/2016 07:58  Assessment & Plan:   1. Controlled type 2 diabetes mellitus with hyperglycemia, without long-term current use of insulin (HCC) Uncontrolled with A1c of 9.2 New diagnosis Continue current management and will review blood sugar  log at next visit Keep blood sugar logs with fasting goals of 80-120 mg/dl, random of less than 180 and in the event of sugars less than 60 mg/dl or greater than 400 mg/dl please notify the clinic ASAP. It is recommended that you undergo annual eye exams and annual foot exams. Pneumovax is recommended every 5 years before the age of 6 and once for a lifetime at or after the age of 76. - Glucose (CBG) - HgB A1c  2. 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 Paroxysmal atrial fibrillation (HCC) Xarelto for anticoagulation; added metoprolol for rate control - metoprolol (LOPRESSOR) 50 MG tablet; Take 1 tablet (50 mg total) by mouth 2 (two) times daily.  Dispense: 60 tablet; Refill: 3  4.  Essential hypertension Discontinue amlodipine and substituted metoprolol due to surgery of A. fib   Meds ordered this encounter  Medications  . metoprolol (LOPRESSOR) 50 MG tablet    Sig: Take 1 tablet (50 mg total) by mouth 2 (two) times daily.    Dispense:  60 tablet    Refill:  3    Follow-up: Return in about 1 month (around 07/31/2016) for Follow-up on diabetes mellitus.   Arnoldo Morale MD

## 2016-06-30 NOTE — Patient Instructions (Signed)
Diabetes Mellitus and Food It is important for you to manage your blood sugar (glucose) level. Your blood glucose level can be greatly affected by what you eat. Eating healthier foods in the appropriate amounts throughout the day at about the same time each day will help you control your blood glucose level. It can also help slow or prevent worsening of your diabetes mellitus. Healthy eating may even help you improve the level of your blood pressure and reach or maintain a healthy weight.  General recommendations for healthful eating and cooking habits include:  Eating meals and snacks regularly. Avoid going long periods of time without eating to lose weight.  Eating a diet that consists mainly of plant-based foods, such as fruits, vegetables, nuts, legumes, and whole grains.  Using low-heat cooking methods, such as baking, instead of high-heat cooking methods, such as deep frying. Work with your dietitian to make sure you understand how to use the Nutrition Facts information on food labels. HOW CAN FOOD AFFECT ME? Carbohydrates Carbohydrates affect your blood glucose level more than any other type of food. Your dietitian will help you determine how many carbohydrates to eat at each meal and teach you how to count carbohydrates. Counting carbohydrates is important to keep your blood glucose at a healthy level, especially if you are using insulin or taking certain medicines for diabetes mellitus. Alcohol Alcohol can cause sudden decreases in blood glucose (hypoglycemia), especially if you use insulin or take certain medicines for diabetes mellitus. Hypoglycemia can be a life-threatening condition. Symptoms of hypoglycemia (sleepiness, dizziness, and disorientation) are similar to symptoms of having too much alcohol.  If your health care provider has given you approval to drink alcohol, do so in moderation and use the following guidelines:  Women should not have more than one drink per day, and men  should not have more than two drinks per day. One drink is equal to:  12 oz of beer.  5 oz of wine.  1 oz of hard liquor.  Do not drink on an empty stomach.  Keep yourself hydrated. Have water, diet soda, or unsweetened iced tea.  Regular soda, juice, and other mixers might contain a lot of carbohydrates and should be counted. WHAT FOODS ARE NOT RECOMMENDED? As you make food choices, it is important to remember that all foods are not the same. Some foods have fewer nutrients per serving than other foods, even though they might have the same number of calories or carbohydrates. It is difficult to get your body what it needs when you eat foods with fewer nutrients. Examples of foods that you should avoid that are high in calories and carbohydrates but low in nutrients include:  Trans fats (most processed foods list trans fats on the Nutrition Facts label).  Regular soda.  Juice.  Candy.  Sweets, such as cake, pie, doughnuts, and cookies.  Fried foods. WHAT FOODS CAN I EAT? Eat nutrient-rich foods, which will nourish your body and keep you healthy. The food you should eat also will depend on several factors, including:  The calories you need.  The medicines you take.  Your weight.  Your blood glucose level.  Your blood pressure level.  Your cholesterol level. You should eat a variety of foods, including:  Protein.  Lean cuts of meat.  Proteins low in saturated fats, such as fish, egg whites, and beans. Avoid processed meats.  Fruits and vegetables.  Fruits and vegetables that may help control blood glucose levels, such as apples, mangoes, and   yams.  Dairy products.  Choose fat-free or low-fat dairy products, such as milk, yogurt, and cheese.  Grains, bread, pasta, and rice.  Choose whole grain products, such as multigrain bread, whole oats, and brown rice. These foods may help control blood pressure.  Fats.  Foods containing healthful fats, such as nuts,  avocado, olive oil, canola oil, and fish. DOES EVERYONE WITH DIABETES MELLITUS HAVE THE SAME MEAL PLAN? Because every person with diabetes mellitus is different, there is not one meal plan that works for everyone. It is very important that you meet with a dietitian who will help you create a meal plan that is just right for you.   This information is not intended to replace advice given to you by your health care provider. Make sure you discuss any questions you have with your health care provider.   Document Released: 05/26/2005 Document Revised: 09/19/2014 Document Reviewed: 07/26/2013 Elsevier Interactive Patient Education 2016 Elsevier Inc.  

## 2016-07-06 ENCOUNTER — Ambulatory Visit (INDEPENDENT_AMBULATORY_CARE_PROVIDER_SITE_OTHER): Payer: Self-pay | Admitting: Family

## 2016-07-06 ENCOUNTER — Telehealth: Payer: Self-pay | Admitting: Family Medicine

## 2016-07-06 ENCOUNTER — Telehealth (INDEPENDENT_AMBULATORY_CARE_PROVIDER_SITE_OTHER): Payer: Self-pay | Admitting: *Deleted

## 2016-07-06 VITALS — Ht 73.0 in | Wt 280.0 lb

## 2016-07-06 DIAGNOSIS — Z89432 Acquired absence of left foot: Secondary | ICD-10-CM

## 2016-07-06 MED ORDER — TRUE METRIX METER W/DEVICE KIT: PACK | 0 refills | Status: DC

## 2016-07-06 MED ORDER — "INSULIN SYRINGE-NEEDLE U-100 31G X 5/16"" 0.5 ML MISC": 0 refills | Status: DC

## 2016-07-06 MED ORDER — TRUEPLUS LANCETS 28G MISC: 5 refills | Status: DC

## 2016-07-06 MED ORDER — GLUCOSE BLOOD VI STRP: ORAL_STRIP | 12 refills | Status: DC

## 2016-07-06 MED FILL — TRUEPLUS SYR 0.3ML 30GX5/16: 30G X 5/16" | 25 days supply | Qty: 100 | Fill #0

## 2016-07-06 MED FILL — TRUE METRIX BLOOD GLUCOSE M: W/DEVICE | 1 days supply | Qty: 1 | Fill #0

## 2016-07-06 NOTE — Telephone Encounter (Signed)
Patient came to the office to request more syringes and machine that he uses to poke his finger. Please follow up. Please send to our pharmacy [facility]

## 2016-07-06 NOTE — Telephone Encounter (Signed)
Requested supplies ordered.

## 2016-07-06 NOTE — Progress Notes (Signed)
Wound Care Note   Patient: Paul Mclaughlin           Date of Birth: 02/25/1956           MRN: [id number]             PCP: Pcp Not In System Visit Date: 07/06/2016   Assessment & Plan: Visit Diagnoses:  1. 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 Apply dry dressings daily. Continue nonweightbearing. Importance of elevation and nonweightbearing emphasized for healing. Will follow up in one more week.  Follow-Up Instructions: Return in about 1 week (around 07/13/2016).  Orders:  No orders of the defined types were placed in this encounter.  No orders of the defined types were placed in this encounter.     Procedures: No notes on file   Clinical Data: No additional findings.   No images are attached to the encounter.   Subjective: Chief Complaint  Patient presents with  . Left Foot - Routine Post Op    Left 5th ray amp on 06/24/16    Pt is s/p a left 5th ray amputation on 06/24/16. He has been non weight bearing in wheelchair with post op shoe. Original surgical dressing intact moderate amount of old dry bloody drainage upon removal. Stitches are present there is a large red superficial open are around the incision that the pt states was the result of a blister that had ruptured due to shoe wear prior to surgery. There is no odor, slight swelling and no c/o pain. The pt states that his foot feels " numb" but that if he is allowed to become weight bearing feels that he may require his rx for pain that he has not used at all since surgery. He is currently staying with a friend so that he can have assistance with ADLs and if told that he can be weight bearing plans to return home.     Review of Systems  Constitutional: Negative for chills and fever.      Objective: Vital Signs: Ht 6\' 1"  (1.854 m)   Wt 280 lb (127 kg)   BMI 36.94 kg/m   Physical Exam: Left foot: Incision well approximated with sutures.  Over central incision there is an ischemic area measuring 15 mm in diameter. There is surrounding maceration with dried blood. There is no drainage or ascending cellulitis.  Specialty Comments: No specialty comments available.   PMFS History: Patient Active Problem List   Diagnosis Date Noted  . Amputation of fifth toe, left, traumatic (HCC) 06/30/2016  . Atrial fibrillation (HCC) 06/30/2016  . Essential hypertension 06/30/2016  . Cellulitis of left foot 06/22/2016  . Controlled type 2 diabetes mellitus with hyperglycemia (HCC) 06/22/2016   Past Medical History:  Diagnosis Date  . Cellulitis and abscess of left leg 06/2016  . Diabetes (HCC)     Family History  Problem Relation Age of Onset  . Diabetes Mellitus II Mother     ESRD  . Diabetes Mellitus II Brother   . Emphysema Father    Past Surgical History:  Procedure Laterality Date  . AMPUTATION Left 06/24/2016   Procedure: FOOT FIFTH RAY TOE AMPUTATION;  Surgeon: Nadara MustardMarcus V Duda, MD;  Location: [facility] OR;  Service: Orthopedics;  Laterality: Left;  . open heart surgery     As a child.  [age] years old.  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    Social History   Occupational History  .  Not on file.   Social History Main Topics  . Smoking status: Never Smoker  . Smokeless tobacco: Never Used  . Alcohol use No  . Drug use:     Types: Marijuana     Comment: "a couple weeks ago"  . Sexual activity: Not on file

## 2016-07-08 MED FILL — TRUEplus LANCETS 28G MISC: 25 days supply | Qty: 100 | Fill #0

## 2016-07-08 NOTE — Telephone Encounter (Signed)
ERROR

## 2016-07-12 ENCOUNTER — Encounter (INDEPENDENT_AMBULATORY_CARE_PROVIDER_SITE_OTHER): Payer: Self-pay | Admitting: Orthopedic Surgery

## 2016-07-12 ENCOUNTER — Ambulatory Visit (INDEPENDENT_AMBULATORY_CARE_PROVIDER_SITE_OTHER): Payer: Self-pay | Admitting: Orthopedic Surgery

## 2016-07-12 VITALS — Ht 73.0 in | Wt 280.0 lb

## 2016-07-12 DIAGNOSIS — Z89422 Acquired absence of other left toe(s): Secondary | ICD-10-CM

## 2016-07-12 DIAGNOSIS — E1142 Type 2 diabetes mellitus with diabetic polyneuropathy: Secondary | ICD-10-CM

## 2016-07-12 DIAGNOSIS — S98132A Complete traumatic amputation of one left lesser toe, initial encounter: Secondary | ICD-10-CM

## 2016-07-12 NOTE — Progress Notes (Signed)
Office Visit Note   Patient: Paul GamesKenneth W Adger           Date of Birth: Sep 28, 1955           MRN: [id number] Visit Date: 07/12/2016              Requested by: No referring provider defined for this encounter. PCP: Pcp Not In System   Assessment & Plan: Visit Diagnoses:  1. Diabetic polyneuropathy associated with type 2 diabetes mellitus (HCC)   2. Amputated toe of left foot (HCC)     Plan: Follow-up in 2 weeks. Patient is to try to continue protected weight-bearing he is currently full weightbearing with no assistive devices.  Follow-Up Instructions: No Follow-up on file.   Orders:  No orders of the defined types were placed in this encounter.  No orders of the defined types were placed in this encounter.     Procedures: No procedures performed   Clinical Data: No additional findings.   Subjective: No chief complaint on file.   Left foot 5th ray amputation on 06/24/16. He is 2 weeks and 4 days out from surgery. He is non weight bearing " as best as I can"  Pt states that he uses a knee scooter at home but is here today in a wheelchair. Washes foot with soap and water and applies a dry dressing change daily and wears a post op shoe. He does have a large eschar the length of the incision and also a small amount of bloody drainage. There is a some swelling and alittle pinkness to the skin. He is not on an ABX and denies being in pain.    Review of Systems   Objective: Vital Signs: Ht 6\' 1"  (1.854 m)   Wt 280 lb (127 kg)   BMI 36.94 kg/m   Physical Exam on examination patient has an eschar which is about 1 cm wide the entire length of the surgical incision. There is no drainage no cellulitis the skin is dry there is no significant swelling no signs of infection. Patient is almost 3 weeks out we will go ahead and harvest the sutures at this time the importance protected weightbearing was discussed follow-up in the office in 2 weeks.  Ortho Exam  Specialty Comments:   No specialty comments available.  Imaging: No results found.   PMFS History: Patient Active Problem List   Diagnosis Date Noted  . Amputation of fifth toe, left, traumatic (HCC) 06/30/2016  . Atrial fibrillation (HCC) 06/30/2016  . Essential hypertension 06/30/2016  . Cellulitis of left foot 06/22/2016  . Controlled type 2 diabetes mellitus with hyperglycemia (HCC) 06/22/2016   Past Medical History:  Diagnosis Date  . Cellulitis and abscess of left leg 06/2016  . Diabetes (HCC)     Family History  Problem Relation Age of Onset  . Diabetes Mellitus II Mother     ESRD  . Diabetes Mellitus II Brother   . Emphysema Father     Past Surgical History:  Procedure Laterality Date  . AMPUTATION Left 06/24/2016   Procedure: FOOT FIFTH RAY TOE AMPUTATION;  Surgeon: Nadara MustardMarcus V Manilla Strieter, MD;  Location: [facility] OR;  Service: Orthopedics;  Laterality: Left;  . open heart surgery     As a child.  [age] years old.  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    Social History   Occupational History  . Not on file.   Social History Main Topics  . Smoking status: Never Smoker  . Smokeless tobacco: Never Used  .  Alcohol use No  . Drug use:     Types: Marijuana     Comment: "a couple weeks ago"  . Sexual activity: Not on file

## 2016-07-13 ENCOUNTER — Other Ambulatory Visit: Payer: Self-pay

## 2016-07-13 DIAGNOSIS — S98132A Complete traumatic amputation of one left lesser toe, initial encounter: Secondary | ICD-10-CM

## 2016-07-13 MED ORDER — RIVAROXABAN 20 MG PO TABS: 20.0000 mg | ORAL_TABLET | Freq: Every day | ORAL | 3 refills | Status: DC

## 2016-07-18 ENCOUNTER — Encounter (INDEPENDENT_AMBULATORY_CARE_PROVIDER_SITE_OTHER): Payer: Self-pay | Admitting: Orthopedic Surgery

## 2016-07-18 ENCOUNTER — Ambulatory Visit (INDEPENDENT_AMBULATORY_CARE_PROVIDER_SITE_OTHER): Payer: Self-pay | Admitting: Orthopedic Surgery

## 2016-07-18 VITALS — Ht 73.0 in | Wt 280.0 lb

## 2016-07-18 DIAGNOSIS — T8189XA Other complications of procedures, not elsewhere classified, initial encounter: Secondary | ICD-10-CM

## 2016-07-18 DIAGNOSIS — Z89422 Acquired absence of other left toe(s): Secondary | ICD-10-CM

## 2016-07-18 MED ORDER — SILVER SULFADIAZINE 1 % EX CREA: TOPICAL_CREAM | Freq: Every day | CUTANEOUS | Status: DC

## 2016-07-18 NOTE — Progress Notes (Signed)
Wound Care Note   Patient: Paul Mclaughlin           Date of Birth: 1956/02/27           MRN: [id number]             PCP: Pcp Not In System Visit Date: 07/18/2016   Assessment & Plan: Visit Diagnoses:  1. Problem involving surgical incision   2. 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 Follow-up in the office in 2 weeks continue nonweightbearing on the left.   Follow-Up Instructions: Return in about 2 weeks (around 08/01/2016).  Orders:  No orders of the defined types were placed in this encounter.  Meds ordered this encounter  Medications  . silver sulfADIAZINE (SILVADENE) 1 % cream      Procedures: No notes on file   Clinical Data: No additional findings.   No images are attached to the encounter.   Subjective: Chief Complaint  Patient presents with  . Left Foot - Routine Post Op    06/24/16 s/p left foot 5th ray amputation    Pt is 3 weeks and 3 days s/p a left foot 5th ray amputation. He had stitches removed last week and states since the distal end of the incision has opened. There is an eschar that runs almost the length of the incision. There is a spot amount for bloody drainage and the pt has been washing with soap and water and applying a dry dressing daily.  He states that he is touch down weight bearing through the heel and is in a post op shoe.    Review of Systems  Miscellaneous:  -Home Health Care: N/A  -Physical Therapy: N/A  -Out of Work?: N/A  -Worker's Compensation Case?: N/A  -Additional Information: N/A   Objective: Vital Signs: Ht 6\' 1"  (1.854 m)   Wt 280 lb (127 kg)   BMI 36.94 kg/m   Physical Exam: Patient has a good dorsalis pedis pulse he has a black eschar along the surgical incision which approximate centimeter wide the entire length of the incision there is no dehiscencecellulitis no signs of deep infection. We will start him on  Silvadene dressing changes plan follow-up in the office in 2 weeks continue nonweightbearing Dial soap cleansing daily.  Specialty Comments: No specialty comments available.   PMFS History: Patient Active Problem List   Diagnosis Date Noted  . Amputation of fifth toe, left, traumatic (HCC) 06/30/2016  . Atrial fibrillation (HCC) 06/30/2016  . Essential hypertension 06/30/2016  . Cellulitis of left foot 06/22/2016  . Controlled type 2 diabetes mellitus with hyperglycemia (HCC) 06/22/2016   Past Medical History:  Diagnosis Date  . Cellulitis and abscess of left leg 06/2016  . Diabetes (HCC)     Family History  Problem Relation Age of Onset  . Diabetes Mellitus II Mother     ESRD  . Diabetes Mellitus II Brother   . Emphysema Father    Past Surgical History:  Procedure Laterality Date  . AMPUTATION Left 06/24/2016   Procedure: FOOT FIFTH RAY TOE AMPUTATION;  Surgeon: Paul MustardMarcus V Alexyia Guarino, MD;  Location: [facility] OR;  Service: Orthopedics;  Laterality: Left;  . open heart surgery     As a child.  [age] years old.  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    Social History   Occupational History  . Not on file.   Social History Main Topics  . Smoking  status: Never Smoker  . Smokeless tobacco: Never Used  . Alcohol use No  . Drug use:     Types: Marijuana     Comment: "a couple weeks ago"  . Sexual activity: Not on file

## 2016-07-20 ENCOUNTER — Telehealth (INDEPENDENT_AMBULATORY_CARE_PROVIDER_SITE_OTHER): Payer: Self-pay | Admitting: Orthopedic Surgery

## 2016-07-20 MED ORDER — SILVER SULFADIAZINE 1 % EX CREA: 1.0000 "application " | TOPICAL_CREAM | Freq: Every day | CUTANEOUS | 0 refills | Status: DC

## 2016-07-20 MED FILL — SSD 1% CREAM: 1 | 20 days supply | Qty: 50 | Fill #0

## 2016-07-20 NOTE — Telephone Encounter (Signed)
Patient would like to have his RX sent [facility] of [facility]  Contact Info: [phone]

## 2016-07-20 NOTE — Telephone Encounter (Signed)
[facility], prior rx for silvadene was ordered clinically preformed and not sent into patients pharmacy. I called patient to make him aware.

## 2016-07-21 ENCOUNTER — Ambulatory Visit (INDEPENDENT_AMBULATORY_CARE_PROVIDER_SITE_OTHER): Payer: Self-pay | Admitting: Orthopedic Surgery

## 2016-07-21 ENCOUNTER — Encounter (INDEPENDENT_AMBULATORY_CARE_PROVIDER_SITE_OTHER): Payer: Self-pay | Admitting: Orthopedic Surgery

## 2016-07-21 VITALS — Ht 73.0 in | Wt 280.0 lb

## 2016-07-21 DIAGNOSIS — Z89422 Acquired absence of other left toe(s): Secondary | ICD-10-CM

## 2016-07-21 DIAGNOSIS — E1142 Type 2 diabetes mellitus with diabetic polyneuropathy: Secondary | ICD-10-CM

## 2016-07-21 MED ORDER — NITROGLYCERIN 0.2 MG/HR TD PT24: 0.2000 mg | MEDICATED_PATCH | Freq: Every day | TRANSDERMAL | 3 refills | Status: DC

## 2016-07-21 MED FILL — NITROGLYCERIN 0.2 MG/HR PAT: 0.2 | 30 days supply | Qty: 30 | Fill #0

## 2016-07-21 NOTE — Progress Notes (Signed)
Wound Care Note   Patient: Paul Mclaughlin           Date of Birth: 1955/09/21           MRN: [id number]             PCP: Jaclyn ShaggyEnobong, Amao, MD Visit Date: 07/21/2016   Assessment & Plan: Visit Diagnoses:  1. History of amputation of lesser toe of left foot (HCC)   2. Diabetic polyneuropathy associated with type 2 diabetes mellitus (HCC)     Plan: Patient is been unable to pick up his Silvadene dressing he will [facility] that up today we will also send a prescription for a nitroglycerin patch to help with his microcirculation. Patient has been up weightbearing and driving in a car and patient states she'll try to take better care of himself at this time. Start Dial soap cleansing daily Silvadene dressing changes daily change the nitroglycerin patch daily.   Follow-Up Instructions: Return in about 2 weeks (around 08/04/2016).  Orders:  No orders of the defined types were placed in this encounter.  No orders of the defined types were placed in this encounter.     Procedures: No notes on file   Clinical Data: No additional findings.   No images are attached to the encounter.   Subjective: Chief Complaint  Patient presents with  . Left Foot - Wound Check    Left 5th ray amputation    Pt is here for work in appointment today for left foot 5th ray amputation 06/30/16. He was out with his friend yesterday running errands, he did not have to get out of the car but was not able to elevate foot much. His friend was changing the dressing and notice more gapping of surgical incision. There is 100% eschar. There is bloody drainage without odor. There is swelling. He was unable to get silvadene cream because pharmacy had to order.     Review of Systems   Objective: Vital Signs: Ht 6\' 1"  (1.854 m)   Wt 280 lb (127 kg)   BMI 36.94 kg/m   Physical Exam: Patient has increasing necrosis and dehiscence of the surgical wound fifth ray amputation left  foot. The wound is gaped open about 5 mm there is about a centimeter of eschar around the wound. There is no ascending cellulitis no purulent drainage no signs of abscess.  Specialty Comments: No specialty comments available.   PMFS History: Patient Active Problem List   Diagnosis Date Noted  . Amputation of fifth toe, left, traumatic (HCC) 06/30/2016  . Atrial fibrillation (HCC) 06/30/2016  . Essential hypertension 06/30/2016  . Cellulitis of left foot 06/22/2016  . Controlled type 2 diabetes mellitus with hyperglycemia (HCC) 06/22/2016   Past Medical History:  Diagnosis Date  . Cellulitis and abscess of left leg 06/2016  . Diabetes (HCC)     Family History  Problem Relation Age of Onset  . Diabetes Mellitus II Mother     ESRD  . Diabetes Mellitus II Brother   . Emphysema Father    Past Surgical History:  Procedure Laterality Date  . AMPUTATION Left 06/24/2016   Procedure: FOOT FIFTH RAY TOE AMPUTATION;  Surgeon: Nadara MustardMarcus V Duda, MD;  Location: [facility] OR;  Service: Orthopedics;  Laterality: Left;  . open heart surgery     As a child.  [age] years old.  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    Social History   Occupational History  . Not on file.  Social History Main Topics  . Smoking status: Never Smoker  . Smokeless tobacco: Never Used  . Alcohol use No  . Drug use:     Types: Marijuana     Comment: "a couple weeks ago"  . Sexual activity: Not on file

## 2016-07-26 ENCOUNTER — Ambulatory Visit (INDEPENDENT_AMBULATORY_CARE_PROVIDER_SITE_OTHER): Payer: Self-pay | Admitting: Orthopedic Surgery

## 2016-07-26 ENCOUNTER — Telehealth: Payer: Self-pay | Admitting: Family Medicine

## 2016-07-26 MED ORDER — LISINOPRIL 10 MG PO TABS: 10.0000 mg | ORAL_TABLET | Freq: Every day | ORAL | 0 refills | Status: DC

## 2016-07-26 MED ORDER — METFORMIN HCL 500 MG PO TABS: 500.0000 mg | ORAL_TABLET | Freq: Two times a day (BID) | ORAL | 0 refills | Status: DC

## 2016-07-26 MED FILL — ?LISINOPRIL 10 MG TABLET: 10 | 30 days supply | Qty: 30 | Fill #0

## 2016-07-26 MED FILL — ?METFORMIN HCL 500MG TABLET: 500 | 30 days supply | Qty: 60 | Fill #0

## 2016-07-26 MED FILL — TRUE METRIX TEST STRIP: 25 days supply | Qty: 100 | Fill #0

## 2016-07-26 MED FILL — ?METOPROLOL 50 MG TABLET: 50 | 30 days supply | Qty: 60 | Fill #1

## 2016-07-26 NOTE — Telephone Encounter (Signed)
Pt. Called requesting refill on the following medications:   metFORMIN (GLUCOPHAGE) 500 MG tablet   lisinopril (PRINIVIL,ZESTRIL) 10 MG tablet  Please f/u

## 2016-07-26 NOTE — Telephone Encounter (Signed)
Requested medications refilled 

## 2016-07-28 MED FILL — XARELTO 20 MG TABLET: 20 | 30 days supply | Qty: 30 | Fill #0

## 2016-08-01 ENCOUNTER — Ambulatory Visit (INDEPENDENT_AMBULATORY_CARE_PROVIDER_SITE_OTHER): Payer: Self-pay | Admitting: Orthopedic Surgery

## 2016-08-01 ENCOUNTER — Encounter (INDEPENDENT_AMBULATORY_CARE_PROVIDER_SITE_OTHER): Payer: Self-pay | Admitting: Orthopedic Surgery

## 2016-08-01 VITALS — Ht 73.0 in | Wt 280.0 lb

## 2016-08-01 DIAGNOSIS — Z89422 Acquired absence of other left toe(s): Secondary | ICD-10-CM

## 2016-08-01 DIAGNOSIS — L97421 Non-pressure chronic ulcer of left heel and midfoot limited to breakdown of skin: Secondary | ICD-10-CM

## 2016-08-01 NOTE — Progress Notes (Signed)
Wound Care Note   Patient: Paul Mclaughlin           Date of Birth: [birth year]           MRN: [id number]             PCP: Jaclyn ShaggyEnobong, Amao, MD Visit Date: 08/01/2016   Assessment & Plan: Visit Diagnoses:  1. Absence of toe, left (HCC)   2. Midfoot ulcer, left, limited to breakdown of skin (HCC)     Plan: Follow-up in one week. Continue Dial soap cleansing daily Silvadene dressing plus dry gauze to pack the wound open with an Ace wrap. Continue elevation continue nonweightbearing on the left.   Follow-Up Instructions: Return in about 1 week (around 08/08/2016).  Orders:  No orders of the defined types were placed in this encounter.  No orders of the defined types were placed in this encounter.     Procedures: No notes on file   Clinical Data: No additional findings.   No images are attached to the encounter.   Subjective: Chief Complaint  Patient presents with  . Left Foot - Routine Post Op    left foot 5th ray amputation 06/30/16    Patient presents today for left foot 5th ray amputation 06/30/16. Patient is approximately 1 month post op. There is wound dehiscence. There is no odor, there is black eschar and fibrinous exudate. He is applying silvadene dressing changes. He uses nitroglycerin patch every other day secondary to a rash he has developed. He has been trying his best to keep weight off foot.     Review of Systems  Miscellaneous:  -Home Health Care: no  -Physical Therapy: no  -Out of Work?: yes   Objective: Vital Signs: Ht 6\' 1"  (1.854 m)   Wt 280 lb (127 kg)   BMI 36.94 kg/m   Physical Exam: On examination patient has no ascending cellulitis no odor no drainage. He did have a hematoma in the area of the dehisced wound this was evacuated. The wound is approximately 2 cm in diameter and 4 cm in width. There is no deep abscess there is good bleeding granulation tissue at the base the wound was packed open with gauze and an Ace wrap.  Specialty  Comments: No specialty comments available.   PMFS History: Patient Active Problem List   Diagnosis Date Noted  . Amputation of fifth toe, left, traumatic (HCC) 06/30/2016  . Atrial fibrillation (HCC) 06/30/2016  . Essential hypertension 06/30/2016  . Cellulitis of left foot 06/22/2016  . Controlled type 2 diabetes mellitus with hyperglycemia (HCC) 06/22/2016   Past Medical History:  Diagnosis Date  . Cellulitis and abscess of left leg 06/2016  . Diabetes (HCC)     Family History  Problem Relation Age of Onset  . Diabetes Mellitus II Mother     ESRD  . Diabetes Mellitus II Brother   . Emphysema Father    Past Surgical History:  Procedure Laterality Date  . AMPUTATION Left 06/24/2016   Procedure: FOOT FIFTH RAY TOE AMPUTATION;  Surgeon: Nadara MustardMarcus V Duda, MD;  Location: [facility] OR;  Service: Orthopedics;  Laterality: Left;  . open heart surgery     As a child.  [age] years old.  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    Social History   Occupational History  . Not on file.   Social History Main Topics  . Smoking status: Never Smoker  . Smokeless tobacco: Never Used  . Alcohol use No  . Drug use:  Types: Marijuana     Comment: "a couple weeks ago"  . Sexual activity: Not on file

## 2016-08-02 ENCOUNTER — Ambulatory Visit: Payer: Self-pay | Attending: Family Medicine | Admitting: Family Medicine

## 2016-08-02 ENCOUNTER — Encounter: Payer: Self-pay | Admitting: Family Medicine

## 2016-08-02 VITALS — BP 160/75 | HR 57 | Temp 98.3°F | Ht 73.0 in | Wt 296.6 lb

## 2016-08-02 DIAGNOSIS — E11621 Type 2 diabetes mellitus with foot ulcer: Secondary | ICD-10-CM | POA: Insufficient documentation

## 2016-08-02 DIAGNOSIS — Z7901 Long term (current) use of anticoagulants: Secondary | ICD-10-CM | POA: Insufficient documentation

## 2016-08-02 DIAGNOSIS — Z794 Long term (current) use of insulin: Secondary | ICD-10-CM | POA: Insufficient documentation

## 2016-08-02 DIAGNOSIS — I480 Paroxysmal atrial fibrillation: Secondary | ICD-10-CM

## 2016-08-02 DIAGNOSIS — I10 Essential (primary) hypertension: Secondary | ICD-10-CM | POA: Insufficient documentation

## 2016-08-02 DIAGNOSIS — Z79899 Other long term (current) drug therapy: Secondary | ICD-10-CM | POA: Insufficient documentation

## 2016-08-02 DIAGNOSIS — L97503 Non-pressure chronic ulcer of other part of unspecified foot with necrosis of muscle: Secondary | ICD-10-CM

## 2016-08-02 DIAGNOSIS — L97423 Non-pressure chronic ulcer of left heel and midfoot with necrosis of muscle: Secondary | ICD-10-CM | POA: Insufficient documentation

## 2016-08-02 DIAGNOSIS — I4891 Unspecified atrial fibrillation: Secondary | ICD-10-CM | POA: Insufficient documentation

## 2016-08-02 DIAGNOSIS — E1149 Type 2 diabetes mellitus with other diabetic neurological complication: Secondary | ICD-10-CM | POA: Insufficient documentation

## 2016-08-02 LAB — GLUCOSE, POCT (MANUAL RESULT ENTRY): POC Glucose: 153 mg/dl — AB (ref 70–99)

## 2016-08-02 MED ORDER — ATORVASTATIN CALCIUM 20 MG PO TABS: 20.0000 mg | ORAL_TABLET | Freq: Every day | ORAL | 3 refills | Status: DC

## 2016-08-02 MED ORDER — METFORMIN HCL 500 MG PO TABS: 500.0000 mg | ORAL_TABLET | Freq: Two times a day (BID) | ORAL | 3 refills | Status: DC

## 2016-08-02 MED ORDER — LISINOPRIL 10 MG PO TABS: 10.0000 mg | ORAL_TABLET | Freq: Every day | ORAL | 3 refills | Status: DC

## 2016-08-02 MED ORDER — GABAPENTIN 300 MG PO CAPS: 300.0000 mg | ORAL_CAPSULE | Freq: Two times a day (BID) | ORAL | 3 refills | Status: DC

## 2016-08-02 MED FILL — GABAPENTIN 300 MG CAPSULE: 300 | 30 days supply | Qty: 60 | Fill #0

## 2016-08-02 MED FILL — ATORVASTATIN 20 MG TABLET: 20 | 30 days supply | Qty: 30 | Fill #0

## 2016-08-02 NOTE — Patient Instructions (Signed)
Hypertension Hypertension, commonly called high blood pressure, is when the force of blood pumping through your arteries is too strong. Your arteries are the blood vessels that carry blood from your heart throughout your body. A blood pressure reading consists of a higher number over a lower number, such as 110/72. The higher number (systolic) is the pressure inside your arteries when your heart pumps. The lower number (diastolic) is the pressure inside your arteries when your heart relaxes. Ideally you want your blood pressure below 120/80. Hypertension forces your heart to work harder to pump blood. Your arteries may become narrow or stiff. Having untreated or uncontrolled hypertension can cause heart attack, stroke, kidney disease, and other problems. What increases the risk? Some risk factors for high blood pressure are controllable. Others are not. Risk factors you cannot control include:  Race. You may be at higher risk if you are African American.  Age. Risk increases with age.  Gender. Men are at higher risk than women before age 45 years. After age 65, women are at higher risk than men. Risk factors you can control include:  Not getting enough exercise or physical activity.  Being overweight.  Getting too much fat, sugar, calories, or salt in your diet.  Drinking too much alcohol. What are the signs or symptoms? Hypertension does not usually cause signs or symptoms. Extremely high blood pressure (hypertensive crisis) may cause headache, anxiety, shortness of breath, and nosebleed. How is this diagnosed? To check if you have hypertension, your health care provider will measure your blood pressure while you are seated, with your arm held at the level of your heart. It should be measured at least twice using the same arm. Certain conditions can cause a difference in blood pressure between your right and left arms. A blood pressure reading that is higher than normal on one occasion does  not mean that you need treatment. If it is not clear whether you have high blood pressure, you may be asked to return on a different day to have your blood pressure checked again. Or, you may be asked to monitor your blood pressure at home for 1 or more weeks. How is this treated? Treating high blood pressure includes making lifestyle changes and possibly taking medicine. Living a healthy lifestyle can help lower high blood pressure. You may need to change some of your habits. Lifestyle changes may include:  Following the DASH diet. This diet is high in fruits, vegetables, and whole grains. It is low in salt, red meat, and added sugars.  Keep your sodium intake below 2,300 mg per day.  Getting at least 30-45 minutes of aerobic exercise at least 4 times per week.  Losing weight if necessary.  Not smoking.  Limiting alcoholic beverages.  Learning ways to reduce stress. Your health care provider may prescribe medicine if lifestyle changes are not enough to get your blood pressure under control, and if one of the following is true:  You are 18-59 years of age and your systolic blood pressure is above 140.  You are 60 years of age or older, and your systolic blood pressure is above 150.  Your diastolic blood pressure is above 90.  You have diabetes, and your systolic blood pressure is over 140 or your diastolic blood pressure is over 90.  You have kidney disease and your blood pressure is above 140/90.  You have heart disease and your blood pressure is above 140/90. Your personal target blood pressure may vary depending on your medical   conditions, your age, and other factors. Follow these instructions at home:  Have your blood pressure rechecked as directed by your health care provider.  Take medicines only as directed by your health care provider. Follow the directions carefully. Blood pressure medicines must be taken as prescribed. The medicine does not work as well when you skip  doses. Skipping doses also puts you at risk for problems.  Do not smoke.  Monitor your blood pressure at home as directed by your health care provider. Contact a health care provider if:  You think you are having a reaction to medicines taken.  You have recurrent headaches or feel dizzy.  You have swelling in your ankles.  You have trouble with your vision. Get help right away if:  You develop a severe headache or confusion.  You have unusual weakness, numbness, or feel faint.  You have severe chest or abdominal pain.  You vomit repeatedly.  You have trouble breathing. This information is not intended to replace advice given to you by your health care provider. Make sure you discuss any questions you have with your health care provider. Document Released: 08/29/2005 Document Revised: 02/04/2016 Document Reviewed: 06/21/2013 Elsevier Interactive Patient Education  2017 Elsevier Inc.  

## 2016-08-02 NOTE — Progress Notes (Signed)
Pt wants to have diabetic supplies and all meds filled here

## 2016-08-02 NOTE — Progress Notes (Signed)
Subjective:  Patient ID: Paul Mclaughlin, male    DOB: 17-Feb-1956  Age: 60 y.o. MRN: [id number]  CC: Diabetes   HPI Paul Mclaughlin is a 60 year old male with a history of type 2 diabetes mellitus (A1c 9.2), hypertension, atrial fibrillation (on anticoagulation with Xarelto and rate control with metoprolol), left fifth toe ray amputation last month who comes into the clinic for a follow-up visit.  He did have some wound dehiscence with development of an ulcer on the lateral aspect of his foot and was seen by orthopedics yesterday with plans for continuous daily wound dressing changes with Silvadene. Also to continue nonweightbearing.  His blood pressure is elevated today and he endorses taking his metoprolol however he did not take lisinopril because he thought it was discontinued. He has lost sensation in his feet.  Review of his blood sugar log indicates fasting sugars have been in the 100-120 range and he has not had hypoglycemia. Random sugars have been less than 180. He is compliant with his Lantus and metformin.   Past Medical History:  Diagnosis Date  . Cellulitis and abscess of left leg 06/2016  . Diabetes [facility])     Past Surgical History:  Procedure Laterality Date  . AMPUTATION Left 06/24/2016   Procedure: FOOT FIFTH RAY TOE AMPUTATION;  Surgeon: Newt Minion, MD;  Location: [place];  Service: Orthopedics;  Laterality: Left;  . open heart surgery     As a child.  34 years old.  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     No Known Allergies   Outpatient Medications Prior to Visit  Medication Sig Dispense Refill  . blood glucose meter kit and supplies KIT Dispense based on patient and insurance preference. Use up to four times daily as directed. (FOR ICD-9 250.00, 250.01). 1 each 0  . Blood Glucose Monitoring Suppl (TRUE METRIX METER) w/Device KIT Use as directed 1 kit 0  . glucose blood (TRUE METRIX BLOOD GLUCOSE TEST) test strip Use as instructed 100 each 12  . insulin glargine (LANTUS)  100 UNIT/ML injection Inject 0.15 mLs (15 Units total) into the skin at bedtime. 10 mL 11  . Insulin Syringe-Needle U-100 (BD INSULIN SYRINGE ULTRAFINE) 31G X 5/16" 0.5 ML MISC Use as directed 100 each 0  . metoprolol (LOPRESSOR) 50 MG tablet Take 1 tablet (50 mg total) by mouth 2 (two) times daily. 60 tablet 3  . nitroGLYCERIN (NITRODUR - DOSED IN MG/24 HR) 0.2 mg/hr patch Place 1 patch (0.2 mg total) onto the skin daily. Change the patch location daily applied to top of foot. 30 patch 3  . rivaroxaban (XARELTO) 20 MG TABS tablet Take 1 tablet (20 mg total) by mouth daily with supper. 90 tablet 3  . silver sulfADIAZINE (SILVADENE) 1 % cream Apply 1 application topically daily. 50 g 0  . TRUEPLUS LANCETS 28G MISC Use as directed 100 each 5  . lisinopril (PRINIVIL,ZESTRIL) 10 MG tablet Take 1 tablet (10 mg total) by mouth daily. 30 tablet 0  . metFORMIN (GLUCOPHAGE) 500 MG tablet Take 1 tablet (500 mg total) by mouth 2 (two) times daily with a meal. 60 tablet 0  . feeding supplement, GLUCERNA SHAKE, (GLUCERNA SHAKE) LIQD Take 237 mLs by mouth 2 (two) times daily between meals. (Patient not taking: Reported on 08/02/2016) 30 Can 0   No facility-administered medications prior to visit.     ROS Review of Systems Constitutional: Negative for activity change and appetite change.  HENT: Negative for sinus pressure and sore throat.  Eyes: Negative for visual disturbance.  Respiratory: Negative for cough, chest tightness and shortness of breath.   Cardiovascular: Negative for chest pain and leg swelling.  Gastrointestinal: Negative for abdominal distention, abdominal pain, constipation and diarrhea.  Endocrine: Negative.   Genitourinary: Negative for dysuria.  Musculoskeletal: Negative for joint swelling and myalgias.       See hpi  Skin: See history of present illness Allergic/Immunologic: Negative.   Neurological: Negative for weakness, light-headedness and positive for numbness.    Psychiatric/Behavioral: Negative for dysphoric mood and suicidal ideas.  Objective:  BP (!) 160/75 (BP Location: Right Arm, Patient Position: Sitting, Cuff Size: Normal)   Pulse (!) 57   Temp 98.3 F (36.8 C) (Oral)   Ht 6' 1"  (1.854 m)   Wt 296 lb 9.6 oz (134.5 kg)   SpO2 98%   BMI 39.13 kg/m   BP/Weight 08/02/2016 08/01/2016 51/03/6159  Systolic BP 737 - -  Diastolic BP 75 - -  Wt. (Lbs) 296.6 280 280  BMI 39.13 36.94 36.94      Physical Exam Constitutional: He is oriented to person, place, and time. He appears well-developed and well-nourished.  Cardiovascular: Normal rate, normal heart sounds and intact distal pulses.  A regularly irregular rhythm present.  No murmur heard. Pulmonary/Chest: Effort normal and breath sounds normal. He has no wheezes. He has no rales. He exhibits no tenderness.  Abdominal: Soft. Bowel sounds are normal. He exhibits no distension and no mass. There is no tenderness.  Musculoskeletal:  Left foot with amputated fifth toe; lateral aspect of foot with ulcer extending into muscle and surrounding edema  Neurological: He is alert and oriented to person, place, and time.   Lab Results  Component Value Date   HGBA1C 9.2 06/30/2016    CMP Latest Ref Rng & Units 06/26/2016 06/25/2016 06/24/2016  Glucose 65 - 99 mg/dL 210(H) 202(H) 168(H)  BUN 6 - 20 mg/dL 11 9 6   Creatinine 0.61 - 1.24 mg/dL 1.13 0.88 0.66  Sodium 135 - 145 mmol/L 133(L) 134(L) 138  Potassium 3.5 - 5.1 mmol/L 4.2 3.6 3.4(L)  Chloride 101 - 111 mmol/L 98(L) 100(L) 109  CO2 22 - 32 mmol/L 27 26 22   Calcium 8.9 - 10.3 mg/dL 8.9 8.7(L) 7.4(L)  Total Protein 6.5 - 8.1 g/dL - - -  Total Bilirubin 0.3 - 1.2 mg/dL - - -  Alkaline Phos 38 - 126 U/L - - -  AST 15 - 41 U/L - - -  ALT 17 - 63 U/L - - -     Assessment & Plan:   1. Type 2 diabetes mellitus with other neurologic complication, with long-term current use of insulin (HCC) Uncontrolled with A1c of 9.2 Blood sugar log  reveals improvement No regimen change We'll add statin to therapy Gabapentin added for neuropathy-discussed sedating side effects - Glucose (CBG) - atorvastatin (LIPITOR) 20 MG tablet; Take 1 tablet (20 mg total) by mouth daily.  Dispense: 30 tablet; Refill: 3 - metFORMIN (GLUCOPHAGE) 500 MG tablet; Take 1 tablet (500 mg total) by mouth 2 (two) times daily with a meal.  Dispense: 60 tablet; Refill: 3 - gabapentin (NEURONTIN) 300 MG capsule; Take 1 capsule (300 mg total) by mouth 2 (two) times daily.  Dispense: 60 capsule; Refill: 3  2. Essential hypertension Uncontrolled Due to the fact that he has not been taking lisinopril which I have advised him to take in addition to metoprolol 2 blood pressure log which will be reviewed at next visit Low-sodium diet -  lisinopril (PRINIVIL,ZESTRIL) 10 MG tablet; Take 1 tablet (10 mg total) by mouth daily.  Dispense: 30 tablet; Refill: 3  3. Diabetic ulcer of left midfoot associated with type 2 diabetes mellitus, with necrosis of muscle (HCC) Dressing change performed in the clinic with Silvadene Continue daily dressing changes at home Keep appointment with Dr. Sharol Given  4. Atrial fibrillation Continue anticoagulation with Xarelto and rate control with metoprolol  Meds ordered this encounter  Medications  . lisinopril (PRINIVIL,ZESTRIL) 10 MG tablet    Sig: Take 1 tablet (10 mg total) by mouth daily.    Dispense:  30 tablet    Refill:  3  . atorvastatin (LIPITOR) 20 MG tablet    Sig: Take 1 tablet (20 mg total) by mouth daily.    Dispense:  30 tablet    Refill:  3  . metFORMIN (GLUCOPHAGE) 500 MG tablet    Sig: Take 1 tablet (500 mg total) by mouth 2 (two) times daily with a meal.    Dispense:  60 tablet    Refill:  3  . gabapentin (NEURONTIN) 300 MG capsule    Sig: Take 1 capsule (300 mg total) by mouth 2 (two) times daily.    Dispense:  60 capsule    Refill:  3    Follow-up: Return in about 1 month (around 09/01/2016) for Follow-up on  hypertension.   Arnoldo Morale MD

## 2016-08-08 ENCOUNTER — Encounter (INDEPENDENT_AMBULATORY_CARE_PROVIDER_SITE_OTHER): Payer: Self-pay | Admitting: Orthopedic Surgery

## 2016-08-08 ENCOUNTER — Ambulatory Visit (INDEPENDENT_AMBULATORY_CARE_PROVIDER_SITE_OTHER): Payer: Self-pay | Admitting: Orthopedic Surgery

## 2016-08-08 VITALS — Ht 73.0 in | Wt 296.0 lb

## 2016-08-08 DIAGNOSIS — E1165 Type 2 diabetes mellitus with hyperglycemia: Secondary | ICD-10-CM

## 2016-08-08 DIAGNOSIS — Z794 Long term (current) use of insulin: Secondary | ICD-10-CM

## 2016-08-08 DIAGNOSIS — S98132D Complete traumatic amputation of one left lesser toe, subsequent encounter: Secondary | ICD-10-CM

## 2016-08-08 MED ORDER — SILVER SULFADIAZINE 1 % EX CREA: 1.0000 "application " | TOPICAL_CREAM | Freq: Every day | CUTANEOUS | 3 refills | Status: DC

## 2016-08-08 NOTE — Progress Notes (Signed)
   Office Visit Note   Patient: Paul Mclaughlin           Date of Birth: November 18, 1955           MRN: [id number] Visit Date: 08/08/2016              Requested by: Jaclyn ShaggyEnobong Amao, MD [address] [place], [place] [id number] PCP: Jaclyn ShaggyEnobong, Amao, MD   Assessment & Plan: Visit Diagnoses:  1. Controlled type 2 diabetes mellitus with hyperglycemia, with long-term current use of insulin (HCC)   2. 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 Patient is been having trouble nitroglycerin patches recommended he moisturize his leg the day before he applies the patch. We will give him medical supplies.  Follow-Up Instructions: Return in about 2 weeks (around 08/22/2016).   Orders:  No orders of the defined types were placed in this encounter.  No orders of the defined types were placed in this encounter.     Procedures: No procedures performed   Clinical Data: No additional findings.   Subjective: Chief Complaint  Patient presents with  . Left Foot - Follow-up  . Follow-up    HPI  Review of Systems   Objective: Vital Signs: Ht 6\' 1"  (1.854 m)   Wt 296 lb (134.3 kg)   BMI 39.05 kg/m   Physical Exam examination patient is improved granulation tissue in the lateral aspect of his foot wound this measures about 4 x 6 cm and 2 cm deep. There is essentially 100% beefy granulation tissue there is some mild ischemic changes dorsally. There is no cellulitis no drainage no signs of infection there is no exposed bone or tendon. Patient has a dry cracked skin from his venous insufficiency.  Ortho Exam  Specialty Comments:  No specialty comments available.  Imaging: No results found.   PMFS History: Patient Active Problem List   Diagnosis Date Noted  . 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 Amputation of fifth toe, left, traumatic (HCC)  06/30/2016  . Atrial fibrillation (HCC) 06/30/2016  . Essential hypertension 06/30/2016  . Cellulitis of left foot 06/22/2016  . Controlled type 2 diabetes mellitus with hyperglycemia (HCC) 06/22/2016   Past Medical History:  Diagnosis Date  . Cellulitis and abscess of left leg 06/2016  . Diabetes (HCC)     Family History  Problem Relation Age of Onset  . Diabetes Mellitus II Mother     ESRD  . Diabetes Mellitus II Brother   . Emphysema Father     Past Surgical History:  Procedure Laterality Date  . AMPUTATION Left 06/24/2016   Procedure: FOOT FIFTH RAY TOE AMPUTATION;  Surgeon: Nadara MustardMarcus V Duda, MD;  Location: [facility];  Service: Orthopedics;  Laterality: Left;  . open heart surgery     As a child.  [age] years old.  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    Social History   Occupational History  . Not on file.   Social History Main Topics  . Smoking status: Never Smoker  . Smokeless tobacco: Never Used  . Alcohol use No     Comment: gave up drinking ~20 years ago  . Drug use:     Types: Marijuana     Comment: "a couple weeks ago"  . Sexual activity: Not on file

## 2016-08-08 NOTE — Addendum Note (Signed)
Addended by: Aldean BakerUDA, Harue Pribble on: 08/08/2016 04:08 PM   Modules accepted: Orders

## 2016-08-08 NOTE — Progress Notes (Signed)
Patient wearing post op shoe.  Still having some drainage on side of left foot.

## 2016-08-10 NOTE — Progress Notes (Deleted)
Cardiology Office Note   Date:  08/10/2016   ID:  Paul Mclaughlin, DOB [birth year], MRN [id number]  PCP:  Arnoldo Morale, MD  Cardiologist:   Minus Breeding, MD  Referring:  ***  No chief complaint on file.     History of Present Illness: Paul Mclaughlin is a 60 y.o. male who presents for follow up of atrial fib.  This was noted when he was in the hospital with osteo and cellulitis. He had fifth ray amputation.  His work up did include an echo with mild RV dysfunction and RV enlargement.  He did have elevated PA pressures.  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  ***  The patient was admitted with redness and swelling of his left foot.  This is in the face of newly diagnosed cellulitis and osteo.  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 We were called post op because he was in atrial fib.  However, after reviewing the chart and talking with anesthesia he has likely been in atrial fib the entire time.  The patient does not notice this rhythm.  He has not seen a doctor in years.  He reports that he was told that he had an irregular rhythm in 2005.  He thinks it was fib.  He reports what sounds like VSD repair at the age of 13.  He is somewhat active and can do yard work and work on cars.  He does have increased DOE.  The patient denies any new symptoms such as chest discomfort, neck or arm discomfort. There has been no new PND or orthopnea. There have been no reported palpitations, presyncope or syncope.   Past Medical History:  Diagnosis Date  . Cellulitis and abscess of left leg 06/2016  . Diabetes [facility])     Past Surgical History:  Procedure Laterality Date  . AMPUTATION Left 06/24/2016   Procedure: FOOT FIFTH RAY TOE AMPUTATION;  Surgeon: Newt Minion, MD;  Location: [place];  Service: Orthopedics;  Laterality: Left;  . open heart surgery     As a child.  59 years old.  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      Current Outpatient  Prescriptions  Medication Sig Dispense Refill  . atorvastatin (LIPITOR) 20 MG tablet Take 1 tablet (20 mg total) by mouth daily. 30 tablet 3  . blood glucose meter kit and supplies KIT Dispense based on patient and insurance preference. Use up to four times daily as directed. (FOR ICD-9 250.00, 250.01). 1 each 0  . Blood Glucose Monitoring Suppl (TRUE METRIX METER) w/Device KIT Use as directed 1 kit 0  . feeding supplement, GLUCERNA SHAKE, (GLUCERNA SHAKE) LIQD Take 237 mLs by mouth 2 (two) times daily between meals. (Patient not taking: Reported on 08/02/2016) 30 Can 0  . gabapentin (NEURONTIN) 300 MG capsule Take 1 capsule (300 mg total) by mouth 2 (two) times daily. 60 capsule 3  . glucose blood (TRUE METRIX BLOOD GLUCOSE TEST) test strip Use as instructed 100 each 12  . insulin glargine (LANTUS) 100 UNIT/ML injection Inject 0.15 mLs (15 Units total) into the skin at bedtime. 10 mL 11  . Insulin Syringe-Needle U-100 (BD INSULIN SYRINGE ULTRAFINE) 31G X 5/16" 0.5 ML MISC Use as directed 100 each 0  . lisinopril (PRINIVIL,ZESTRIL) 10 MG tablet Take 1 tablet (10 mg total) by mouth daily. 30 tablet 3  . metFORMIN (GLUCOPHAGE) 500 MG tablet Take  1 tablet (500 mg total) by mouth 2 (two) times daily with a meal. 60 tablet 3  . metoprolol (LOPRESSOR) 50 MG tablet Take 1 tablet (50 mg total) by mouth 2 (two) times daily. 60 tablet 3  . nitroGLYCERIN (NITRODUR - DOSED IN MG/24 HR) 0.2 mg/hr patch Place 1 patch (0.2 mg total) onto the skin daily. Change the patch location daily applied to top of foot. 30 patch 3  . rivaroxaban (XARELTO) 20 MG TABS tablet Take 1 tablet (20 mg total) by mouth daily with supper. 90 tablet 3  . silver sulfADIAZINE (SILVADENE) 1 % cream Apply 1 application topically daily. 400 g 3  . TRUEPLUS LANCETS 28G MISC Use as directed 100 each 5   No current facility-administered medications for this visit.     Allergies:   Patient has no known allergies.    ROS:  Please see the  history of present illness.   Otherwise, review of systems are positive for {NONE DEFAULTED:18576::"none"}.   All other systems are reviewed and negative.    PHYSICAL EXAM: VS:  There were no vitals taken for this visit. , BMI There is no height or weight on file to calculate BMI. GENERAL:  Well appearing HEENT:  Pupils equal round and reactive, fundi not visualized, oral mucosa unremarkable NECK:  No jugular venous distention, waveform within normal limits, carotid upstroke brisk and symmetric, no bruits, no thyromegaly LYMPHATICS:  No cervical, inguinal adenopathy LUNGS:  Clear to auscultation bilaterally BACK:  No CVA tenderness CHEST:  Unremarkable HEART:  PMI not displaced or sustained,S1 and S2 within normal limits, no S3, no S4, no clicks, no rubs, *** murmurs ABD:  Flat, positive bowel sounds normal in frequency in pitch, no bruits, no rebound, no guarding, no midline pulsatile mass, no hepatomegaly, no splenomegaly EXT:  2 plus pulses throughout, no edema, no cyanosis no clubbing SKIN:  No rashes no nodules NEURO:  Cranial nerves II through XII grossly intact, motor grossly intact throughout PSYCH:  Cognitively intact, oriented to person place and time    EKG:  EKG {ACTION; IS/IS FXT:02409735} ordered today. The ekg ordered today demonstrates ***   Recent Labs: 06/22/2016: ALT 13 06/25/2016: TSH 1.275 06/26/2016: BUN 11; Creatinine, Ser 1.13; Hemoglobin 12.1; Platelets 244; Potassium 4.2; Sodium 133    Lipid Panel    Component Value Date/Time   CHOL 131 06/25/2016 0452   TRIG 149 06/25/2016 0452   HDL 15 (L) 06/25/2016 0452   CHOLHDL 8.7 06/25/2016 0452   VLDL 30 06/25/2016 0452   LDLCALC 86 06/25/2016 0452      Wt Readings from Last 3 Encounters:  08/08/16 296 lb (134.3 kg)  08/02/16 296 lb 9.6 oz (134.5 kg)  08/01/16 280 lb (127 kg)      Other studies Reviewed: Additional studies/ records that were reviewed today include: ***. Review of the above records  demonstrates:  Please see elsewhere in the note.  ***   ASSESSMENT AND PLAN:  ATRIAL FIB:  ***  RV DYSFUNCTION:  ***   Current medicines are reviewed at length with the patient today.  The patient {ACTIONS; HAS/DOES NOT HAVE:19233} concerns regarding medicines.  The following changes have been made:  {PLAN; NO CHANGE:13088:s}  Labs/ tests ordered today include: *** No orders of the defined types were placed in this encounter.    Disposition:   FU with ***    Signed, Minus Breeding, MD  08/10/2016 8:26 PM    [place]

## 2016-08-11 ENCOUNTER — Ambulatory Visit: Payer: Self-pay | Admitting: Cardiology

## 2016-08-16 MED FILL — SSD 1% CREAM: 1 | 30 days supply | Qty: 400 | Fill #0

## 2016-08-17 MED FILL — !LANTUS 100 UNITS/ML VIAL: 100 | 28 days supply | Qty: 10 | Fill #1

## 2016-08-22 ENCOUNTER — Encounter (INDEPENDENT_AMBULATORY_CARE_PROVIDER_SITE_OTHER): Payer: Self-pay | Admitting: Orthopedic Surgery

## 2016-08-22 ENCOUNTER — Ambulatory Visit (INDEPENDENT_AMBULATORY_CARE_PROVIDER_SITE_OTHER): Payer: Self-pay | Admitting: Family

## 2016-08-22 DIAGNOSIS — Z89432 Acquired absence of left foot: Secondary | ICD-10-CM

## 2016-08-22 DIAGNOSIS — IMO0002 Reserved for concepts with insufficient information to code with codable children: Secondary | ICD-10-CM

## 2016-08-22 NOTE — Progress Notes (Signed)
   Wound Care Note   Patient: Paul Mclaughlin           Date of Birth: Aug 25, 1956           MRN: [id number]             PCP: Jaclyn ShaggyEnobong, Amao, MD Visit Date: 08/22/2016   Assessment & Plan: Visit Diagnoses:  1. Foot amputation status, left (HCC)     Plan: Continue with daily wound care. Silvadene dressing changes. Continue nitro patches. Minimize weight bearing. Recommended against return to work. Will follow up in 2 more weeks.    Follow-Up Instructions: Return in about 2 weeks (around 09/05/2016).  Orders:  No orders of the defined types were placed in this encounter.  No orders of the defined types were placed in this encounter.     Procedures: No notes on file   Clinical Data: No additional findings.   No images are attached to the encounter.   Subjective: Chief Complaint  Patient presents with  . Left Foot - Wound Check    Patient is a 60 year old gentleman who presents for follow up of left foot fifth ray amputation with wound dehiscence. He is protected weightbearing in regular shoe wear. Full weight bearing. He is doing silvadene dressing changes. He is currently using nitroglycerin patches.    Review of Systems  Constitutional: Negative for chills and fever.       Objective: Vital Signs: There were no vitals taken for this visit.  Physical Exam: Patient is alert and oriented. No adenopathy. Well-dressed. Normal affect. Respirations easy.  Left foot lateral foot with dehisced wound. This is 6 cm x 2 cm. This is 8 mm deep. Does no probe. Covered 50% with eschar. This was debrided with a #10 blade knife back to bleeding viable tissue. No drainage, no odor. No surrounding erythema or cellulitis.   Specialty Comments: No specialty comments available.   PMFS History: Patient Active Problem List   Diagnosis Date Noted  . Foot amputation status, left (HCC) 08/22/2016  . 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 Atrial  fibrillation (HCC) 06/30/2016  . Essential hypertension 06/30/2016  . Cellulitis of left foot 06/22/2016  . Controlled type 2 diabetes mellitus with hyperglycemia (HCC) 06/22/2016   Past Medical History:  Diagnosis Date  . Cellulitis and abscess of left leg 06/2016  . Diabetes (HCC)     Family History  Problem Relation Age of Onset  . Diabetes Mellitus II Mother     ESRD  . Diabetes Mellitus II Brother   . Emphysema Father    Past Surgical History:  Procedure Laterality Date  . AMPUTATION Left 06/24/2016   Procedure: FOOT FIFTH RAY TOE AMPUTATION;  Surgeon: Nadara MustardMarcus V Duda, MD;  Location: [facility] OR;  Service: Orthopedics;  Laterality: Left;  . open heart surgery     As a child.  [age] years old.  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    Social History   Occupational History  . Not on file.   Social History Main Topics  . Smoking status: Never Smoker  . Smokeless tobacco: Never Used  . Alcohol use No     Comment: gave up drinking ~20 years ago  . Drug use:     Types: Marijuana     Comment: "a couple weeks ago"  . Sexual activity: Not on file

## 2016-08-24 MED FILL — NITROGLYCERIN 0.2 MG/HR PAT: 0.2 | 30 days supply | Qty: 30 | Fill #1

## 2016-08-24 MED FILL — TRUEplus LANCETS 28G MISC: 25 days supply | Qty: 100 | Fill #1

## 2016-08-24 MED FILL — XARELTO 20 MG TABLET: 20 | 30 days supply | Qty: 30 | Fill #1

## 2016-08-24 MED FILL — metFORMIN HCL 500 MG TABS: 500 | 30 days supply | Qty: 60 | Fill #0

## 2016-08-24 MED FILL — TRUE METRIX TEST STRIP: 25 days supply | Qty: 100 | Fill #1

## 2016-08-24 MED FILL — METOPROLOL TARTRATE 50 MG T: 50 | 30 days supply | Qty: 60 | Fill #2

## 2016-08-24 MED FILL — ?LISINOPRIL 10 MG TABLET: 10 | 30 days supply | Qty: 30 | Fill #0

## 2016-08-25 ENCOUNTER — Other Ambulatory Visit: Payer: Self-pay

## 2016-08-25 MED ORDER — INSULIN GLARGINE 100 UNIT/ML ~~LOC~~ SOLN: 15.0000 [IU] | Freq: Every day | SUBCUTANEOUS | 3 refills | Status: DC

## 2016-08-29 MED FILL — GABAPENTIN 300 MG CAPSULE: 300 | 30 days supply | Qty: 60 | Fill #1

## 2016-08-29 MED FILL — ATORVASTATIN 20 MG TABLET: 20 | 30 days supply | Qty: 30 | Fill #1

## 2016-09-07 ENCOUNTER — Encounter: Payer: Self-pay | Admitting: Family Medicine

## 2016-09-07 ENCOUNTER — Ambulatory Visit: Payer: Self-pay | Attending: Family Medicine | Admitting: Family Medicine

## 2016-09-07 VITALS — BP 174/74 | HR 59 | Temp 98.2°F | Ht 73.0 in | Wt 284.4 lb

## 2016-09-07 DIAGNOSIS — E1142 Type 2 diabetes mellitus with diabetic polyneuropathy: Secondary | ICD-10-CM | POA: Insufficient documentation

## 2016-09-07 DIAGNOSIS — I480 Paroxysmal atrial fibrillation: Secondary | ICD-10-CM

## 2016-09-07 DIAGNOSIS — Z794 Long term (current) use of insulin: Secondary | ICD-10-CM | POA: Insufficient documentation

## 2016-09-07 DIAGNOSIS — Z9889 Other specified postprocedural states: Secondary | ICD-10-CM | POA: Insufficient documentation

## 2016-09-07 DIAGNOSIS — E11621 Type 2 diabetes mellitus with foot ulcer: Secondary | ICD-10-CM

## 2016-09-07 DIAGNOSIS — I10 Essential (primary) hypertension: Secondary | ICD-10-CM | POA: Insufficient documentation

## 2016-09-07 DIAGNOSIS — I4891 Unspecified atrial fibrillation: Secondary | ICD-10-CM | POA: Insufficient documentation

## 2016-09-07 DIAGNOSIS — L97423 Non-pressure chronic ulcer of left heel and midfoot with necrosis of muscle: Secondary | ICD-10-CM

## 2016-09-07 DIAGNOSIS — E1165 Type 2 diabetes mellitus with hyperglycemia: Secondary | ICD-10-CM

## 2016-09-07 DIAGNOSIS — Z5189 Encounter for other specified aftercare: Secondary | ICD-10-CM | POA: Insufficient documentation

## 2016-09-07 DIAGNOSIS — R062 Wheezing: Secondary | ICD-10-CM

## 2016-09-07 DIAGNOSIS — Z79899 Other long term (current) drug therapy: Secondary | ICD-10-CM | POA: Insufficient documentation

## 2016-09-07 LAB — GLUCOSE, POCT (MANUAL RESULT ENTRY): POC Glucose: 118 mg/dl — AB (ref 70–99)

## 2016-09-07 MED ORDER — LISINOPRIL 20 MG PO TABS: 20.0000 mg | ORAL_TABLET | Freq: Every day | ORAL | 3 refills | Status: DC

## 2016-09-07 MED ORDER — ALBUTEROL SULFATE HFA 108 (90 BASE) MCG/ACT IN AERS: 2.0000 | INHALATION_SPRAY | Freq: Four times a day (QID) | RESPIRATORY_TRACT | 0 refills | Status: AC | PRN

## 2016-09-07 MED FILL — !VENTOLIN HFA INHALER: 108 (90 BAS | 20 days supply | Qty: 18 | Fill #0

## 2016-09-07 NOTE — Patient Instructions (Signed)
Hypertension Hypertension, commonly called high blood pressure, is when the force of blood pumping through your arteries is too strong. Your arteries are the blood vessels that carry blood from your heart throughout your body. A blood pressure reading consists of a higher number over a lower number, such as 110/72. The higher number (systolic) is the pressure inside your arteries when your heart pumps. The lower number (diastolic) is the pressure inside your arteries when your heart relaxes. Ideally you want your blood pressure below 120/80. Hypertension forces your heart to work harder to pump blood. Your arteries may become narrow or stiff. Having untreated or uncontrolled hypertension can cause heart attack, stroke, kidney disease, and other problems. What increases the risk? Some risk factors for high blood pressure are controllable. Others are not. Risk factors you cannot control include:  Race. You may be at higher risk if you are African American.  Age. Risk increases with age.  Gender. Men are at higher risk than women before age 45 years. After age 65, women are at higher risk than men. Risk factors you can control include:  Not getting enough exercise or physical activity.  Being overweight.  Getting too much fat, sugar, calories, or salt in your diet.  Drinking too much alcohol. What are the signs or symptoms? Hypertension does not usually cause signs or symptoms. Extremely high blood pressure (hypertensive crisis) may cause headache, anxiety, shortness of breath, and nosebleed. How is this diagnosed? To check if you have hypertension, your health care provider will measure your blood pressure while you are seated, with your arm held at the level of your heart. It should be measured at least twice using the same arm. Certain conditions can cause a difference in blood pressure between your right and left arms. A blood pressure reading that is higher than normal on one occasion does  not mean that you need treatment. If it is not clear whether you have high blood pressure, you may be asked to return on a different day to have your blood pressure checked again. Or, you may be asked to monitor your blood pressure at home for 1 or more weeks. How is this treated? Treating high blood pressure includes making lifestyle changes and possibly taking medicine. Living a healthy lifestyle can help lower high blood pressure. You may need to change some of your habits. Lifestyle changes may include:  Following the DASH diet. This diet is high in fruits, vegetables, and whole grains. It is low in salt, red meat, and added sugars.  Keep your sodium intake below 2,300 mg per day.  Getting at least 30-45 minutes of aerobic exercise at least 4 times per week.  Losing weight if necessary.  Not smoking.  Limiting alcoholic beverages.  Learning ways to reduce stress. Your health care provider may prescribe medicine if lifestyle changes are not enough to get your blood pressure under control, and if one of the following is true:  You are 18-59 years of age and your systolic blood pressure is above 140.  You are 60 years of age or older, and your systolic blood pressure is above 150.  Your diastolic blood pressure is above 90.  You have diabetes, and your systolic blood pressure is over 140 or your diastolic blood pressure is over 90.  You have kidney disease and your blood pressure is above 140/90.  You have heart disease and your blood pressure is above 140/90. Your personal target blood pressure may vary depending on your medical   conditions, your age, and other factors. Follow these instructions at home:  Have your blood pressure rechecked as directed by your health care provider.  Take medicines only as directed by your health care provider. Follow the directions carefully. Blood pressure medicines must be taken as prescribed. The medicine does not work as well when you skip  doses. Skipping doses also puts you at risk for problems.  Do not smoke.  Monitor your blood pressure at home as directed by your health care provider. Contact a health care provider if:  You think you are having a reaction to medicines taken.  You have recurrent headaches or feel dizzy.  You have swelling in your ankles.  You have trouble with your vision. Get help right away if:  You develop a severe headache or confusion.  You have unusual weakness, numbness, or feel faint.  You have severe chest or abdominal pain.  You vomit repeatedly.  You have trouble breathing. This information is not intended to replace advice given to you by your health care provider. Make sure you discuss any questions you have with your health care provider. Document Released: 08/29/2005 Document Revised: 02/04/2016 Document Reviewed: 06/21/2013 Elsevier Interactive Patient Education  2017 Elsevier Inc.  

## 2016-09-07 NOTE — Progress Notes (Signed)
 Subjective:    Patient ID: Paul Mclaughlin, male    DOB: 05/11/1956, 60 y.o.   MRN: [id number]  HPI He is a 60-year-old male with a history of type 2 diabetes mellitus (A1c 9.2), hypertension, atrial fibrillation (on anticoagulation with Xarelto and rate control with metoprolol), left fifth toe ray amputation who comes into the clinic for a follow-up visit.  He Continues to perform daily wound dressing changes of his left foot and is scheduled to see his orthopedic - Dr. Duda tomorrow.  His blood pressure is elevated despite compliance with metoprolol and lisinopril but he does not have his medication bottles with him. He complains of wheezing and slight shortness of breath when he lies down flat; informs me that he did have asthma as a child. Denies smoking or substance abuse and dyspnea is absent at this time.   Past Medical History:  Diagnosis Date  . Cellulitis and abscess of left leg 06/2016  . Diabetes (HCC)     Past Surgical History:  Procedure Laterality Date  . AMPUTATION Left 06/24/2016   Procedure: FOOT FIFTH RAY TOE AMPUTATION;  Surgeon: Marcus V Duda, MD;  Location: [facility] OR;  Service: Orthopedics;  Laterality: Left;  . open heart surgery     As a child.  [age] years old.  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     No Known Allergies  No Known Allergies  Current Outpatient Prescriptions on File Prior to Visit  Medication Sig Dispense Refill  . atorvastatin (LIPITOR) 20 MG tablet Take 1 tablet (20 mg total) by mouth daily. 30 tablet 3  . blood glucose meter kit and supplies KIT Dispense based on patient and insurance preference. Use up to four times daily as directed. (FOR ICD-9 250.00, 250.01). 1 each 0  . Blood Glucose Monitoring Suppl (TRUE METRIX METER) w/Device KIT Use as directed 1 kit 0  . gabapentin (NEURONTIN) 300 MG capsule Take 1 capsule (300 mg total) by mouth 2 (two) times daily. 60 capsule 3  . glucose blood (TRUE METRIX BLOOD GLUCOSE TEST) test strip Use as instructed 100 each 12    . insulin glargine (LANTUS) 100 UNIT/ML injection Inject 0.15 mLs (15 Units total) into the skin at bedtime. 30 mL 3  . Insulin Syringe-Needle U-100 (BD INSULIN SYRINGE ULTRAFINE) 31G X 5/16" 0.5 ML MISC Use as directed 100 each 0  . metFORMIN (GLUCOPHAGE) 500 MG tablet Take 1 tablet (500 mg total) by mouth 2 (two) times daily with a meal. 60 tablet 3  . metoprolol (LOPRESSOR) 50 MG tablet Take 1 tablet (50 mg total) by mouth 2 (two) times daily. 60 tablet 3  . nitroGLYCERIN (NITRODUR - DOSED IN MG/24 HR) 0.2 mg/hr patch Place 1 patch (0.2 mg total) onto the skin daily. Change the patch location daily applied to top of foot. 30 patch 3  . rivaroxaban (XARELTO) 20 MG TABS tablet Take 1 tablet (20 mg total) by mouth daily with supper. 90 tablet 3  . silver sulfADIAZINE (SILVADENE) 1 % cream Apply 1 application topically daily. 400 g 3  . TRUEPLUS LANCETS 28G MISC Use as directed 100 each 5  . feeding supplement, GLUCERNA SHAKE, (GLUCERNA SHAKE) LIQD Take 237 mLs by mouth 2 (two) times daily between meals. (Patient not taking: Reported on 09/07/2016) 30 Can 0   No current facility-administered medications on file prior to visit.      Review of Systems Constitutional: Negative for activity change and appetite change.  HENT: Negative for sinus pressure and sore throat.     Eyes: Negative for visual disturbance.  Respiratory: Negative for cough, chest tightness and shortness of breath.   Cardiovascular: Negative for chest pain and leg swelling.  Gastrointestinal: Negative for abdominal distention, abdominal pain, constipation and diarrhea.  Endocrine: Negative.   Genitourinary: Negative for dysuria.  Musculoskeletal: Negative for joint swelling and myalgias.       See hpi  Skin: See history of present illness Allergic/Immunologic: Negative.   Neurological: Negative for weakness, light-headedness and positive for numbness.  Psychiatric/Behavioral: Negative for dysphoric mood and suicidal ideas     Objective: Vitals:   09/07/16 0924  BP: (!) 174/74  Pulse: (!) 59  Temp: 98.2 F (36.8 C)  TempSrc: Oral  SpO2: 92%  Weight: 284 lb 6.4 oz (129 kg)  Height: 6' 1" (1.854 m)      Physical Exam Constitutional: He is oriented to person, place, and time. He appears well-developed and well-nourished.  Cardiovascular: Bradycardic rate, normal heart sounds and intact distal pulses.  Regular rhythm present.  No murmur heard. Pulmonary/Chest: Effort normal and breath sounds normal. He has no wheezes. He has no rales. He exhibits no tenderness.  Abdominal: Soft. Bowel sounds are normal. He exhibits no distension and no mass. There is no tenderness.  Musculoskeletal:  Left foot with amputated fifth toe Neurological: He is alert and oriented to person, place, and time.        Assessment & Plan:  1. Type 2 diabetes mellitus with other neurologic complication, with long-term current use of insulin (HCC) Uncontrolled with A1c of 9.2 Blood sugar log reveals improvement No regimen change We'll add statin to therapy Gabapentin added for neuropathy-discussed sedating side effects - Glucose (CBG) - atorvastatin (LIPITOR) 20 MG tablet; Take 1 tablet (20 mg total) by mouth daily.  Dispense: 30 tablet; Refill: 3 - metFORMIN (GLUCOPHAGE) 500 MG tablet; Take 1 tablet (500 mg total) by mouth 2 (two) times daily with a meal.  Dispense: 60 tablet; Refill: 3 - gabapentin (NEURONTIN) 300 MG capsule; Take 1 capsule (300 mg total) by mouth 2 (two) times daily.  Dispense: 60 capsule; Refill: 3  2. Essential hypertension Uncontrolled Increase lisinopril dose; continue metoprolol If blood pressure remains elevated at his next visit I will add amlodipine to her regimen Advised to bring in all his medications at his next visit so we can a ensure he is taking everything. Low-sodium diet - lisinopril (PRINIVIL,ZESTRIL) 10 MG tablet; Take 1 tablet (10 mg total) by mouth daily.  Dispense: 30 tablet; Refill:  3  3. 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 Sharol Given tomorrow  4. Atrial fibrillation Continue anticoagulation with Xarelto and rate control with metoprolol

## 2016-09-07 NOTE — Progress Notes (Signed)
Seeing foot surgeon tomorrow Out of amlodipine for several weeks.- is not on med list

## 2016-09-08 ENCOUNTER — Ambulatory Visit (INDEPENDENT_AMBULATORY_CARE_PROVIDER_SITE_OTHER): Payer: Self-pay | Admitting: Family

## 2016-09-23 MED FILL — ?ATORVASTATIN 20 MG TABLET: 20 | 30 days supply | Qty: 30 | Fill #2

## 2016-09-23 MED FILL — METOPROLOL TARTRATE 50 MG T: 50 | 30 days supply | Qty: 60 | Fill #3

## 2016-09-23 MED FILL — metFORMIN HCL 500 MG TABS: 500 | 30 days supply | Qty: 60 | Fill #1

## 2016-09-23 MED FILL — XARELTO 20 MG TABLET: 20 | 30 days supply | Qty: 30 | Fill #2

## 2016-09-23 MED FILL — $LANTUS 100 UNITS/ML VIAL: 100 | 28 days supply | Qty: 10 | Fill #2

## 2016-09-27 MED FILL — GABAPENTIN 300 MG CAPSULE: 300 | 30 days supply | Qty: 60 | Fill #2

## 2016-09-27 MED FILL — ?LISINOPRIL 20 MG TABLET: 20 | 30 days supply | Qty: 30 | Fill #0

## 2016-09-29 ENCOUNTER — Ambulatory Visit (INDEPENDENT_AMBULATORY_CARE_PROVIDER_SITE_OTHER): Payer: Self-pay | Admitting: Orthopedic Surgery

## 2016-10-04 ENCOUNTER — Ambulatory Visit: Payer: Self-pay | Attending: Family Medicine | Admitting: Family Medicine

## 2016-10-04 ENCOUNTER — Encounter: Payer: Self-pay | Admitting: Family Medicine

## 2016-10-04 VITALS — BP 184/108 | HR 60 | Temp 97.6°F | Ht 73.0 in | Wt 291.6 lb

## 2016-10-04 DIAGNOSIS — E11621 Type 2 diabetes mellitus with foot ulcer: Secondary | ICD-10-CM

## 2016-10-04 DIAGNOSIS — E118 Type 2 diabetes mellitus with unspecified complications: Secondary | ICD-10-CM | POA: Insufficient documentation

## 2016-10-04 DIAGNOSIS — I480 Paroxysmal atrial fibrillation: Secondary | ICD-10-CM

## 2016-10-04 DIAGNOSIS — R0600 Dyspnea, unspecified: Secondary | ICD-10-CM | POA: Insufficient documentation

## 2016-10-04 DIAGNOSIS — Z794 Long term (current) use of insulin: Secondary | ICD-10-CM | POA: Insufficient documentation

## 2016-10-04 DIAGNOSIS — Z79899 Other long term (current) drug therapy: Secondary | ICD-10-CM | POA: Insufficient documentation

## 2016-10-04 DIAGNOSIS — Z5189 Encounter for other specified aftercare: Secondary | ICD-10-CM | POA: Insufficient documentation

## 2016-10-04 DIAGNOSIS — L97423 Non-pressure chronic ulcer of left heel and midfoot with necrosis of muscle: Secondary | ICD-10-CM

## 2016-10-04 DIAGNOSIS — L97529 Non-pressure chronic ulcer of other part of left foot with unspecified severity: Secondary | ICD-10-CM | POA: Insufficient documentation

## 2016-10-04 DIAGNOSIS — R0609 Other forms of dyspnea: Secondary | ICD-10-CM

## 2016-10-04 DIAGNOSIS — I10 Essential (primary) hypertension: Secondary | ICD-10-CM

## 2016-10-04 DIAGNOSIS — E1165 Type 2 diabetes mellitus with hyperglycemia: Secondary | ICD-10-CM

## 2016-10-04 DIAGNOSIS — I110 Hypertensive heart disease with heart failure: Secondary | ICD-10-CM | POA: Insufficient documentation

## 2016-10-04 DIAGNOSIS — B351 Tinea unguium: Secondary | ICD-10-CM | POA: Insufficient documentation

## 2016-10-04 LAB — COMPLETE METABOLIC PANEL WITH GFR
ALBUMIN: 3.4 g/dL — AB (ref 3.6–5.1)
ALK PHOS: 118 U/L — AB (ref 40–115)
ALT: 13 U/L (ref 9–46)
AST: 17 U/L (ref 10–35)
BUN: 17 mg/dL (ref 7–25)
CHLORIDE: 102 mmol/L (ref 98–110)
CO2: 25 mmol/L (ref 20–31)
Calcium: 9.2 mg/dL (ref 8.6–10.3)
Creat: 0.94 mg/dL (ref 0.70–1.33)
GFR, Est African American: >89 mL/min (ref >=60)
GFR, Est Non African American: 88 mL/min (ref >=60)
GLUCOSE: 113 mg/dL — AB (ref 65–99)
POTASSIUM: 4.4 mmol/L (ref 3.5–5.3)
SODIUM: 135 mmol/L (ref 135–146)
Total Bilirubin: 1.2 mg/dL (ref 0.2–1.2)
Total Protein: 6.6 g/dL (ref 6.1–8.1)

## 2016-10-04 LAB — POCT GLYCOSYLATED HEMOGLOBIN (HGB A1C): Hemoglobin A1C: 5.9

## 2016-10-04 LAB — GLUCOSE, POCT (MANUAL RESULT ENTRY): POC GLUCOSE: 118 mg/dL — AB (ref 70–99)

## 2016-10-04 MED ORDER — HYDRALAZINE HCL 25 MG PO TABS: 25.0000 mg | ORAL_TABLET | Freq: Three times a day (TID) | ORAL | 3 refills | Status: DC

## 2016-10-04 MED ORDER — CICLOPIROX 8 % EX SOLN: Freq: Every day | CUTANEOUS | 1 refills | Status: DC

## 2016-10-04 MED ORDER — EFINACONAZOLE 10 % EX SOLN
CUTANEOUS | 1 refills | Status: DC
Start: 2016-10-04 — End: 2016-10-04

## 2016-10-04 MED ORDER — CLONIDINE HCL 0.1 MG PO TABS
0.1000 mg | ORAL_TABLET | Freq: Once | ORAL | Status: AC
  Administered 2016-10-04: 0.1 mg via ORAL

## 2016-10-04 MED FILL — CICLOPIROX 8% SOLUTION: 8 | 20 days supply | Qty: 7 | Fill #0

## 2016-10-04 MED FILL — hydrALAZINE HCL 25 MG TABS: 25 | 30 days supply | Qty: 90 | Fill #0

## 2016-10-04 NOTE — Progress Notes (Signed)
Subjective:  Patient ID: Paul Mclaughlin, male    DOB: 08-28-1956  Age: 61 y.o. MRN: [id number]  CC: Diabetes and Hypertension   HPI Paul Mclaughlin is a 61 year old male presenting today for follow up for blood pressure. His BP is elevated today to 184/108. He began the increased dose of lisinopril 26m yesterday. He has not been checking his BP at home and does not have a meter at home. Denies headache, chest pain, and claudication. He is not exercising but feels this is related to the wound on his foot and reduced mobility.   He has a history of type 2 diabetes mellitus. A1c today is 5.9 down from 9.2. He had a left fifth toe amputation 06/24/16. He continues to perform dressing changes and is scheduled to see Dr. DSharol Giventomorrow. Patient says it is healing well. He checks his sugars at home once a day. He has not been logging his blood sugars lately because he feels his sugars 'have been good' and '160 or less.' Denies episodes of hypoglycemia.   He was started on a statin at his last visit. He denies muscle aches and malaise. Last lipid panel was 06/25/16.   He has a history of atrial fibrillation and is anticoagulated with Xarelto and takes metoprolol for rate control. Has not seen cardiology.   He complains of some occasional shortness of breath worsened when exerting himself particularly when climbing the 5 stairs in to his home.He says it is easier to breath when sitting up and sleeps on 2-3 pillows at home.  He does notice some swelling in his lower extremities that does not go away. Pt had echo when inpatient in October which showed mild CHF with EF of 55%  He denies having ever smoked.    Past Medical History:  Diagnosis Date  . Cellulitis and abscess of left leg 06/2016  . Diabetes ([facility]     Past Surgical History:  Procedure Laterality Date  . AMPUTATION Left 06/24/2016   Procedure: FOOT FIFTH RAY TOE AMPUTATION;  Surgeon: MNewt Minion MD;  [facility]  Service:  Orthopedics;  Laterality: Left;  . open heart surgery     As a child.  F12years old.  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     Family History  Problem Relation Age of Onset  . Diabetes Mellitus II Mother     ESRD  . Diabetes Mellitus II Brother   . Emphysema Father     Social History  Substance Use Topics  . Smoking status: Never Smoker  . Smokeless tobacco: Never Used  . Alcohol use No     Comment: gave up drinking ~20 years ago    ROS Review of Systems  Constitutional: Negative for activity change, appetite change, diaphoresis and fatigue.  HENT: Positive for sore throat. Negative for tinnitus and trouble swallowing.   Respiratory: Positive for shortness of breath and wheezing.        Shortness of breath on exertion. None at rest. Wheezing with exertion.   Cardiovascular: Positive for leg swelling. Negative for chest pain and palpitations.  Gastrointestinal: Negative for abdominal pain, diarrhea and nausea.  Endocrine: Negative for polydipsia, polyphagia and polyuria.  Genitourinary: Positive for urgency. Negative for difficulty urinating and dysuria.  Musculoskeletal: Positive for gait problem. Negative for arthralgias, back pain and myalgias.  Skin: Positive for wound.  Neurological: Negative for dizziness, syncope, weakness and light-headedness.  Psychiatric/Behavioral: Negative for confusion. The patient is not nervous/anxious.     Objective:  Today's Vitals: BP (!) 184/108 (BP Location: Left Arm, Cuff Size: Large)   Pulse 60   Temp 97.6 F (36.4 C) (Oral)   Ht '6\' 1"'$  (1.854 m)   Wt 291 lb 9.6 oz (132.3 kg)   SpO2 97%   BMI 38.47 kg/m   Physical Exam  Constitutional: He is oriented to person, place, and time. He appears well-developed and well-nourished. No distress.  HENT:  Head: Normocephalic and atraumatic.  Eyes: Pupils are equal, round, and reactive to light.  Cardiovascular: S1 normal and S2 normal.  An irregularly irregular rhythm present.  Pulses:      Radial pulses  are 2+ on the right side, and 2+ on the left side.       Posterior tibial pulses are 2+ on the right side, and 2+ on the left side.  No JVD.  Pulmonary/Chest: Effort normal and breath sounds normal. No accessory muscle usage. No respiratory distress. He has no wheezes. He has no rales.  Abdominal: Soft. Bowel sounds are normal.  Musculoskeletal: He exhibits edema (2+).  Neurological: He is alert and oriented to person, place, and time.  Skin: Skin is warm and dry. He is not diaphoretic.  Shiny, thin skin on lower legs. Dressing in place on left foot.   Psychiatric: He has a normal mood and affect. Judgment normal.    Assessment & Plan:   Hypertension/High Blood pressure- clonidine 0.'1mg'$  administered in clinic for elevated BP. Blood pressure is not well controlled on current medications. Start hydralazine '25mg'$  TID. Follow DASH diet and increase physical activity.   Diabetes- stable. continue lantus 15u at bedtime and metformin. Referral to podiatry to nail care. Will plan on diabetic foot exam at next appointment. Will check Lipid Profile at next visit. Please come fasting.  Diabetic foot ulcer with amputation- continue daily wound changes and follow-up with Dr. Sharol Mclaughlin. Monitor for signs of infection.   Onychomycosis- start Ciclopirox. Apply to affected toenail once a day .   A fib- CHADS score of 3. Continue xarelto and metoprolol. Will refer you to cardiology.  Dyspnea/SOB- continue to use albuterol inhaler as needed for exertional symptoms. Will check CMP, BNP today. Referral to cardiology & PFTs.  Outpatient Encounter Prescriptions as of 10/04/2016  Medication Sig  . albuterol (PROVENTIL HFA;VENTOLIN HFA) 108 (90 Base) MCG/ACT inhaler Inhale 2 puffs into the lungs every 6 (six) hours as needed for wheezing or shortness of breath.  [name] atorvastatin (LIPITOR) 20 MG tablet Take 1 tablet (20 mg total) by mouth daily.  . blood glucose meter kit and supplies KIT Dispense based on patient and  insurance preference. Use up to four times daily as directed. (FOR ICD-9 250.00, 250.01).  . Blood Glucose Monitoring Suppl (TRUE METRIX METER) w/Device KIT Use as directed  . gabapentin (NEURONTIN) 300 MG capsule Take 1 capsule (300 mg total) by mouth 2 (two) times daily.  [name] glucose blood (TRUE METRIX BLOOD GLUCOSE TEST) test strip Use as instructed  . insulin glargine (LANTUS) 100 UNIT/ML injection Inject 0.15 mLs (15 Units total) into the skin at bedtime.  . Insulin Syringe-Needle U-100 (BD INSULIN SYRINGE ULTRAFINE) 31G X 5/16" 0.5 ML MISC Use as directed  . lisinopril (PRINIVIL,ZESTRIL) 20 MG tablet Take 1 tablet (20 mg total) by mouth daily.  . metFORMIN (GLUCOPHAGE) 500 MG tablet Take 1 tablet (500 mg total) by mouth 2 (two) times daily with a meal.  . metoprolol (LOPRESSOR) 50 MG tablet Take 1 tablet (50 mg total) by mouth 2 (two)  times daily.  . nitroGLYCERIN (NITRODUR - DOSED IN MG/24 HR) 0.2 mg/hr patch Place 1 patch (0.2 mg total) onto the skin daily. Change the patch location daily applied to top of foot.  . rivaroxaban (XARELTO) 20 MG TABS tablet Take 1 tablet (20 mg total) by mouth daily with supper.  . silver sulfADIAZINE (SILVADENE) 1 % cream Apply 1 application topically daily.  . TRUEPLUS LANCETS 28G MISC Use as directed  . Efinaconazole 10 % SOLN Apply to affected toenail once daily for 48 weeks.  . hydrALAZINE (APRESOLINE) 25 MG tablet Take 1 tablet (25 mg total) by mouth 3 (three) times daily.  . [DISCONTINUED] feeding supplement, GLUCERNA SHAKE, (GLUCERNA SHAKE) LIQD Take 237 mLs by mouth 2 (two) times daily between meals. (Patient not taking: Reported on 09/07/2016)  . [EXPIRED] cloNIDine (CATAPRES) tablet 0.1 mg    No facility-administered encounter medications on file as of 10/04/2016.     Follow-up: Return in about 1 month (around 11/04/2016) for follow-up on hypertension.    Paul Rutter, RN, BSN, [place] DNP Student

## 2016-10-04 NOTE — Patient Instructions (Signed)
Shortness of Breath Shortness of breath means you have trouble breathing. Shortness of breath needs medical care right away. HOME CARE   Do not smoke.  Avoid being around chemicals or things (paint fumes, dust) that may bother your breathing.  Rest as needed. Slowly begin your normal activities.  Only take medicines as told by your doctor.  Keep all doctor visits as told. GET HELP RIGHT AWAY IF:   Your shortness of breath gets worse.  You feel lightheaded, pass out (faint), or have a cough that is not helped by medicine.  You cough up blood.  You have pain with breathing.  You have pain in your chest, arms, shoulders, or belly (abdomen).  You have a fever.  You cannot walk up stairs or exercise the way you normally do.  You do not get better in the time expected.  You have a hard time doing normal activities even with rest.  You have problems with your medicines.  You have any new symptoms. MAKE SURE YOU:  Understand these instructions.  Will watch your condition.  Will get help right away if you are not doing well or get worse. This information is not intended to replace advice given to you by your health care provider. Make sure you discuss any questions you have with your health care provider. Document Released: 02/15/2008 Document Revised: 09/03/2013 Document Reviewed: 11/14/2011 Elsevier Interactive Patient Education  2017 Else[employer] Inc[employer]   Hypertension Hypertension is another name for high blood pressure. High blood pressure forces your heart to work harder to pump blood. A blood pressure reading has two numbers, which includes a higher number over a lower number (example: 110/72). Follow these instructions at home:  Have your blood pressure rechecked by your doctor.  Only take medicine as told by your doctor. Follow the directions carefully. The medicine does not work as well if you skip doses. Skipping doses also puts you at risk for problems.  Do not  smoke.  Monitor your blood pressure at home as told by your doctor. Contact a doctor if:  You think you are having a reaction to the medicine you are taking.  You have repeat headaches or feel dizzy.  You have puffiness (swelling) in your ankles.  You have trouble with your vision. Get help right away if:  You get a very bad headache and are confused.  You feel weak, numb, or faint.  You get chest or belly (abdominal) pain.  You throw up (vomit).  You cannot breathe very well. This information is not intended to replace advice given to you by your health care provider. Make sure you discuss any questions you have with your health care provider. Document Released: 02/15/2008 Document Revised: 02/04/2016 Document Reviewed: 06/21/2013 Elsevier Interactive Patient Education  2017 Else[employer] Inc.     Fungal Nail Infection Introduction Fungal nail infection is a common fungal infection of the toenails or fingernails. This condition affects toenails more often than fingernails. More than one nail may be infected. The condition can be passed from person to person (is contagious). What are the causes? This condition is caused by a fungus. Several types of funguses can cause the infection. These funguses are common in moist and warm areas. If your hands or feet come into contact with the fungus, it may get into a crack in your fingernail or toenail and cause the infection. What increases the risk? The following factors may make you more likely to develop this condition:  Being male.  Having  diabetes.  Being of older age.  Living with someone who has the fungus.  Walking barefoot in areas where the fungus thrives, such as showers or locker rooms.  Having poor circulation.  Wearing shoes and socks that cause your feet to sweat.  Having athlete's foot.  Having a nail injury or history of a recent nail surgery.  Having psoriasis.  Having a weak body defense system (immune  system). What are the signs or symptoms? Symptoms of this condition include:  A pale spot on the nail.  Thickening of the nail.  A nail that becomes yellow or brown.  A brittle or ragged nail edge.  A crumbling nail.  A nail that has lifted away from the nail bed. How is this diagnosed? This condition is diagnosed with a physical exam. Your health care provider may take a scraping or clipping from your nail to test for the fungus. How is this treated? Mild infections do not need treatment. If you have significant nail changes, treatment may include:  Oral antifungal medicines. You may need to take the medicine for several weeks or several months, and you may not see the results for a long time. These medicines can cause side effects. Ask your health care provider what problems to watch for.  Antifungal nail polish and nail cream. These may be used along with oral antifungal medicines.  Laser treatment of the nail.  Surgery to remove the nail. This may be needed for the most severe infections. Treatment takes a long time, and the infection may come back. Follow these instructions at home: Medicines  Take or apply over-the-counter and prescription medicines only as told by your health care provider.  Ask your health care provider about using over-the-counter mentholated ointment on your nails. Lifestyle  Do not share personal items, such as towels or nail clippers.  Trim your nails often.  Wash and dry your hands and feet every day.  Wear absorbent socks, and change your socks frequently.  Wear shoes that allow air to circulate, such as sandals or canvas tennis shoes. Throw out old shoes.  Wear rubber gloves if you are working with your hands in wet areas.  Do not walk barefoot in shower rooms or locker rooms.  Do not use a nail salon that does not use clean instruments.  Do not use artificial nails. General instructions  Keep all follow-up visits as told by your  health care provider. This is important.  Use antifungal foot powder on your feet and in your shoes. Contact a health care provider if: Your infection is not getting better or it is getting worse after several months. This information is not intended to replace advice given to you by your health care provider. Make sure you discuss any questions you have with your health care provider. Document Released: 08/26/2000 Document Revised: 02/04/2016 Document Reviewed: 03/02/2015  2017 Elsevier DASH Eating Plan DASH stands for "Dietary Approaches to Stop Hypertension." The DASH eating plan is a healthy eating plan that has been shown to reduce high blood pressure (hypertension). Additional health benefits may include reducing the risk of type 2 diabetes mellitus, heart disease, and stroke. The DASH eating plan may also help with weight loss. What do I need to know about the DASH eating plan? For the DASH eating plan, you will follow these general guidelines:  Choose foods with less than 150 milligrams of sodium per serving (as listed on the food label).  Use salt-free seasonings or herbs instead of table  salt or sea salt.  Check with your health care provider or pharmacist before using salt substitutes.  Eat lower-sodium products. These are often labeled as "low-sodium" or "no salt added."  Eat fresh foods. Avoid eating a lot of canned foods.  Eat more vegetables, fruits, and low-fat dairy products.  Choose whole grains. Look for the word "whole" as the first word in the ingredient list.  Choose fish and skinless chicken or [place] more often than red meat. Limit fish, poultry, and meat to 6 oz (170 g) each day.  Limit sweets, desserts, sugars, and sugary drinks.  Choose heart-healthy fats.  Eat more home-cooked food and less restaurant, buffet, and fast food.  Limit fried foods.  Do not fry foods. Cook foods using methods such as baking, boiling, grilling, and broiling instead.  When  eating at a restaurant, ask that your food be prepared with less salt, or no salt if possible. What foods can I eat? Seek help from a dietitian for individual calorie needs. Grains  Whole grain or whole wheat bread. Brown rice. Whole grain or whole wheat pasta. Quinoa, bulgur, and whole grain cereals. Low-sodium cereals. Corn or whole wheat flour tortillas. Whole grain cornbread. Whole grain crackers. Low-sodium crackers. Vegetables  Fresh or frozen vegetables (raw, steamed, roasted, or grilled). Low-sodium or reduced-sodium tomato and vegetable juices. Low-sodium or reduced-sodium tomato sauce and paste. Low-sodium or reduced-sodium canned vegetables. Fruits  All fresh, canned (in natural juice), or frozen fruits. Meat and Other Protein Products  Ground beef (85% or leaner), grass-fed beef, or beef trimmed of fat. Skinless chicken or [place]. Ground chicken or [place]. Pork trimmed of fat. All fish and seafood. Eggs. Dried beans, peas, or lentils. Unsalted nuts and seeds. Unsalted canned beans. Dairy  Low-fat dairy products, such as skim or 1% milk, 2% or reduced-fat cheeses, low-fat ricotta or cottage cheese, or plain low-fat yogurt. Low-sodium or reduced-sodium cheeses. Fats and Oils  Tub margarines without trans fats. Light or reduced-fat mayonnaise and salad dressings (reduced sodium). Avocado. Safflower, olive, or canola oils. Natural peanut or almond butter. Other  Unsalted popcorn and pretzels. The items listed above may not be a complete list of recommended foods or beverages. Contact your dietitian for more options.  What foods are not recommended? Grains  White bread. White pasta. White rice. Refined cornbread. Bagels and croissants. Crackers that contain trans fat. Vegetables  Creamed or fried vegetables. Vegetables in a cheese sauce. Regular canned vegetables. Regular canned tomato sauce and paste. Regular tomato and vegetable juices. Fruits  Canned fruit in light or heavy syrup.  Fruit juice. Meat and Other Protein Products  Fatty cuts of meat. Ribs, chicken wings, bacon, sausage, bologna, salami, chitterlings, fatback, hot dogs, bratwurst, and packaged luncheon meats. Salted nuts and seeds. Canned beans with salt. Dairy  Whole or 2% milk, cream, half-and-half, and cream cheese. Whole-fat or sweetened yogurt. Full-fat cheeses or blue cheese. Nondairy creamers and whipped toppings. Processed cheese, cheese spreads, or cheese curds. Condiments  Onion and garlic salt, seasoned salt, table salt, and sea salt. Canned and packaged gravies. Worcestershire sauce. Tartar sauce. Barbecue sauce. Teriyaki sauce. Soy sauce, including reduced sodium. Steak sauce. Fish sauce. Oyster sauce. Cocktail sauce. Horseradish. Ketchup and mustard. Meat flavorings and tenderizers. Bouillon cubes. Hot sauce. Tabasco sauce. Marinades. Taco seasonings. Relishes. Fats and Oils  Butter, stick margarine, lard, shortening, ghee, and bacon fat. Coconut, palm kernel, or palm oils. Regular salad dressings. Other  Pickles and olives. Salted popcorn and pretzels. The items listed  above may not be a complete list of foods and beverages to avoid. Contact your dietitian for more information.  Where can I find more information? National Heart, Lung, and Blood Institute: [employer] This information is not intended to replace advice given to you by your health care provider. Make sure you discuss any questions you have with your health care provider. Document Released: 08/18/2011 Document Revised: 02/04/2016 Document Reviewed: 07/03/2013 Elsevier Interactive Patient Education  2017 [employer].

## 2016-10-04 NOTE — Progress Notes (Signed)
Just started new dose of lisinopril yesterday

## 2016-10-05 ENCOUNTER — Ambulatory Visit (INDEPENDENT_AMBULATORY_CARE_PROVIDER_SITE_OTHER): Payer: Self-pay | Admitting: Orthopedic Surgery

## 2016-10-05 DIAGNOSIS — IMO0002 Reserved for concepts with insufficient information to code with codable children: Secondary | ICD-10-CM

## 2016-10-05 DIAGNOSIS — Z89432 Acquired absence of left foot: Secondary | ICD-10-CM

## 2016-10-05 LAB — BRAIN NATRIURETIC PEPTIDE: Brain Natriuretic Peptide: 225.7 pg/mL — ABNORMAL HIGH (ref <100)

## 2016-10-05 NOTE — Progress Notes (Signed)
Office Visit Note   Patient: Paul Mclaughlin           Date of Birth: 1955/12/20           MRN: [id number] Visit Date: 10/05/2016              Requested by: Jaclyn Shaggy, MD [address] [place], [place] [address] PCP: Jaclyn Shaggy, MD  Chief Complaint  Patient presents with  . Left Foot - Follow-up    06/24/16 left foot 5th ray amputation    HPI: Patient is full weight bearing in a post op shoe. He is treating with silvadene dressing daily. Patient is not taking an antibiotic. The wound is draining a spot amount of yellow drain. The wound bed is 50% non viable tissue. There is no redness or swelling and the pt does not have any questions or concerns today. Autumn L Forrest, RMA  Patient states his blood pressure recently has been running as high as 190 and he had to stay at his primary care doctor's office until his blood pressure was controlled with oral medication.  Assessment & Plan:  Visit Diagnoses:  1. Foot amputation status, left (HCC)     Plan: We'll have him continue the Silvadene dressing changes we gave him some gauze and more Ace wrap to use. Continue Dial soap cleansing continue with his postoperative shoe  Follow-Up Instructions: Return in about 3 weeks (around 10/26/2016).   Ortho Exam Examination the wound bed after debridement has 90% hyper granulation tissue the wound is approximately 5 x 30 mm. This was touched with silver nitrate Bactroban and a dry dressing was applied there is no cellulitis no odor no drainage no signs of infection no exposed bone or tendon.  Imaging: No results found.  Orders:  No orders of the defined types were placed in this encounter.  No orders of the defined types were placed in this encounter.    Procedures: No procedures performed  Clinical Data: No additional findings.  Subjective: Review of Systems  Objective: Vital Signs: There were no vitals taken for this visit.  Specialty Comments:  No specialty  comments available.  PMFS History: Patient Active Problem List   Diagnosis Date Noted  . Foot amputation status, left (HCC) 08/22/2016  . 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 Atrial fibrillation (HCC) 06/30/2016  . Essential hypertension 06/30/2016  . Cellulitis of left foot 06/22/2016  . Controlled type 2 diabetes mellitus with hyperglycemia (HCC) 06/22/2016   Past Medical History:  Diagnosis Date  . Cellulitis and abscess of left leg 06/2016  . Diabetes (HCC)     Family History  Problem Relation Age of Onset  . Diabetes Mellitus II Mother     ESRD  . Diabetes Mellitus II Brother   . Emphysema Father     Past Surgical History:  Procedure Laterality Date  . AMPUTATION Left 06/24/2016   Procedure: FOOT FIFTH RAY TOE AMPUTATION;  Surgeon: [name], MD;  Location: [facility] OR;  Service: Orthopedics;  Laterality: Left;  . open heart surgery     As a child.  [age] years old.  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    Social History   Occupational History  . Not on file.   Social History Main Topics  . Smoking status: Never Smoker  . Smokeless tobacco: Never Used  . Alcohol use No     Comment: gave up drinking ~20 years ago  . Drug use: Yes  Types: Marijuana     Comment: 2 weeks ago  . Sexual activity: Not on file

## 2016-10-07 ENCOUNTER — Telehealth: Payer: Self-pay

## 2016-10-07 ENCOUNTER — Other Ambulatory Visit: Payer: Self-pay | Admitting: Family Medicine

## 2016-10-07 NOTE — Telephone Encounter (Signed)
-----   Message from Jaclyn ShaggyEnobong Amao, MD sent at 10/06/2016 11:29 AM EST ----- His blood work does not point to acute heart failure. If his lower extremity edema is bothersome I could put him on low dose Lasix

## 2016-10-07 NOTE — Telephone Encounter (Signed)
Per patient writer LVM on patient's phone regarding his lab results. Requested patient to call back if he has questions or would ike MD to prescribed lasix.

## 2016-10-07 NOTE — Progress Notes (Signed)
Patient stopped in clinic today.  Writer printed his lab results and per Dr. Venetia NightAmao gave him the option of starting lasix for leg and feet edema.  Patient stated that he does not want to add another medication to his regimen.  However he is starting back to work part time and if he has trouble with swelling he might change his mind.

## 2016-10-10 MED FILL — TRUEPLUS SYR 0.3ML 30GX5/16: 30G X 5/16" | 25 days supply | Qty: 100 | Fill #0

## 2016-10-14 ENCOUNTER — Inpatient Hospital Stay (HOSPITAL_COMMUNITY): Admission: RE | Admit: 2016-10-14 | Payer: Self-pay | Source: Ambulatory Visit

## 2016-10-24 ENCOUNTER — Other Ambulatory Visit: Payer: Self-pay | Admitting: Family Medicine

## 2016-10-24 DIAGNOSIS — I480 Paroxysmal atrial fibrillation: Secondary | ICD-10-CM

## 2016-10-24 MED FILL — ?LISINOPRIL 20 MG TABLET: 20 | 30 days supply | Qty: 30 | Fill #1

## 2016-10-24 MED FILL — TRUE METRIX TEST STRIP: 25 days supply | Qty: 100 | Fill #2

## 2016-10-24 MED FILL — GABAPENTIN 300 MG CAPSULE: 300 | 30 days supply | Qty: 60 | Fill #3

## 2016-10-24 MED FILL — $LANTUS 100 UNITS/ML VIAL: 100 | 28 days supply | Qty: 10 | Fill #3

## 2016-10-24 MED FILL — XARELTO 20 MG TABLET: 20 | 30 days supply | Qty: 30 | Fill #3

## 2016-10-24 MED FILL — METOPROLOL TARTRATE 50 MG T: 50 | 30 days supply | Qty: 60 | Fill #0

## 2016-10-24 MED FILL — ATORVASTATIN 20 MG TABLET: 20 | 30 days supply | Qty: 30 | Fill #3

## 2016-10-24 MED FILL — ?METFORMIN HCL 500MG TABLET: 500 | 30 days supply | Qty: 60 | Fill #2

## 2016-10-26 ENCOUNTER — Encounter (INDEPENDENT_AMBULATORY_CARE_PROVIDER_SITE_OTHER): Payer: Self-pay | Admitting: Orthopedic Surgery

## 2016-10-26 ENCOUNTER — Ambulatory Visit (INDEPENDENT_AMBULATORY_CARE_PROVIDER_SITE_OTHER): Payer: Self-pay | Admitting: Family

## 2016-10-26 VITALS — Ht 73.0 in | Wt 291.0 lb

## 2016-10-26 DIAGNOSIS — Z89432 Acquired absence of left foot: Secondary | ICD-10-CM

## 2016-10-26 DIAGNOSIS — IMO0002 Reserved for concepts with insufficient information to code with codable children: Secondary | ICD-10-CM

## 2016-10-26 MED ORDER — NITROGLYCERIN 0.2 MG/HR TD PT24: 0.2000 mg | MEDICATED_PATCH | Freq: Every day | TRANSDERMAL | 3 refills | Status: DC

## 2016-10-26 MED FILL — NITROGLYCERIN 0.2 MG/HR PAT: 0.2 | 30 days supply | Qty: 30 | Fill #0

## 2016-10-26 NOTE — Progress Notes (Signed)
Office Visit Note   Patient: Paul Mclaughlin           Date of Birth: [birth year]           MRN: [id number] Visit Date: 10/26/2016              Requested by: Jaclyn Shaggy, MD [address] [place], [place] [address] PCP: Jaclyn Shaggy, MD  Chief Complaint  Patient presents with  . 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 Incision remains open with 100% fibrinous tissue. The pt is full weight bearing with a silvadene dressing and a post op shoe. Patient also states tht he " got too close to my heater" and caused a blister to form to the tip of left great toe. It ruptured and is slightly macerated and covered with an ace bandage. Rodena Medin, RMA  The shunt is a 61 year old gentleman who is seen in follow-up for persistent ulceration over a left foot fifth ray amputation site. He is about 5 months out from surgery. There is exudative tissue in the wound bed today. He's been doing Silvadene dressing changes daily. He also has blistering to the left great toe from where his toe was too close to heater recently he was unable to feel that his toe was burning.  He also dropped a socket wrench on the same great toe. Has blackened area to the lateral great toe from the injury.   Has been applying nitro patches over the foot daily.       Assessment & Plan:    Visit Diagnoses: No diagnosis found.  Plan: Continue daily wound cleansing. Apply Silvadene dressings to both the great toe ulcers as well as the lateral foot ulcer. Continue to do daily foot checks. Follow-up in the office in 4 more weeks.  Follow-Up Instructions: No Follow-up on file.   Ortho Exam Physical Exam  Constitutional: Appears well-developed.  Head: Normocephalic.  Eyes: EOM are normal.  Neck: Normal range of motion.  Cardiovascular: Normal rate.   Pulmonary/Chest: Effort normal.  Neurological: Is alert.    Skin: Skin is warm.  Psychiatric: Has a normal mood and affect. Him left foot: There is a ulceration over the lateral aspect of the foot over the fifth ray amputation incision this is a 45 mm in length by 7 mm in width. It was debrided of fibrinous exudative tissue back to DC bleeding granulation tissue today. The wound bed has no depth. The great toe has blistered ulceration as seen in the photograph. This was debrided of nonviable tissue. There is granulation tissue in the wound bed. There is a 3 mm in diameter eschar over the lateral aspect great toe this is no surrounding erythema and no drainage there is no odor to the foot. No sign of infection.  Imaging: No results found.  Orders:  No orders of the defined types were placed in this encounter.  No orders of the defined types were placed in this encounter.    Procedures: No procedures performed  Clinical Data: No additional findings.  Subjective: Review of Systems  Constitutional: Negative for chills and fever.  Cardiovascular: Negative for leg swelling.  Skin: Negative for color change and wound.    Objective: Vital Signs: Ht 6\' 1"  (1.854 m)   Wt 291 lb (132 kg)   BMI 38.39 kg/m   Specialty Comments:  No specialty comments available.  PMFS History: Patient Active Problem List   Diagnosis Date Noted  . Foot amputation status, left (HCC) 08/22/2016  . 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 Atrial fibrillation (HCC) 06/30/2016  . Essential hypertension 06/30/2016  . Cellulitis of left foot 06/22/2016  . Controlled type 2 diabetes mellitus with hyperglycemia (HCC) 06/22/2016   Past Medical History:  Diagnosis Date  . Cellulitis and abscess of left leg 06/2016  . Diabetes (HCC)     Family History  Problem Relation Age of Onset  . Diabetes Mellitus II Mother     ESRD  . Diabetes Mellitus II Brother   . Emphysema Father     Past Surgical History:  Procedure Laterality Date  .  AMPUTATION Left 06/24/2016   Procedure: FOOT FIFTH RAY TOE AMPUTATION;  Surgeon: Nadara MustardMarcus V Duda, MD;  Location: [facility] OR;  Service: Orthopedics;  Laterality: Left;  . open heart surgery     As a child.  [age] years old.  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    Social History   Occupational History  . Not on file.   Social History Main Topics  . Smoking status: Never Smoker  . Smokeless tobacco: Never Used  . Alcohol use No     Comment: gave up drinking ~20 years ago  . Drug use: Yes    Types: Marijuana     Comment: 2 weeks ago  . Sexual activity: Not on file

## 2016-11-03 MED FILL — hydrALAZINE HCL 25 MG TABS: 25 | 30 days supply | Qty: 90 | Fill #1

## 2016-11-09 ENCOUNTER — Emergency Department (HOSPITAL_COMMUNITY): Payer: Self-pay

## 2016-11-09 ENCOUNTER — Emergency Department (HOSPITAL_COMMUNITY)
Admission: EM | Admit: 2016-11-09 | Discharge: 2016-11-09 | Disposition: A | Discharge status: Home / Self Care | Payer: Self-pay | Attending: Emergency Medicine | Admitting: Emergency Medicine

## 2016-11-09 ENCOUNTER — Encounter (HOSPITAL_COMMUNITY): Payer: Self-pay | Admitting: *Deleted

## 2016-11-09 ENCOUNTER — Ambulatory Visit: Payer: Self-pay | Attending: Family Medicine | Admitting: Family Medicine

## 2016-11-09 ENCOUNTER — Encounter: Payer: Self-pay | Admitting: Family Medicine

## 2016-11-09 VITALS — BP 181/90 | HR 66 | Temp 97.6°F | Ht 73.0 in | Wt 342.6 lb

## 2016-11-09 DIAGNOSIS — I10 Essential (primary) hypertension: Secondary | ICD-10-CM | POA: Insufficient documentation

## 2016-11-09 DIAGNOSIS — Z79899 Other long term (current) drug therapy: Secondary | ICD-10-CM | POA: Insufficient documentation

## 2016-11-09 DIAGNOSIS — IMO0002 Reserved for concepts with insufficient information to code with codable children: Secondary | ICD-10-CM

## 2016-11-09 DIAGNOSIS — Z9889 Other specified postprocedural states: Secondary | ICD-10-CM | POA: Insufficient documentation

## 2016-11-09 DIAGNOSIS — G4700 Insomnia, unspecified: Secondary | ICD-10-CM | POA: Insufficient documentation

## 2016-11-09 DIAGNOSIS — R0609 Other forms of dyspnea: Secondary | ICD-10-CM | POA: Insufficient documentation

## 2016-11-09 DIAGNOSIS — Z89422 Acquired absence of other left toe(s): Secondary | ICD-10-CM | POA: Insufficient documentation

## 2016-11-09 DIAGNOSIS — R635 Abnormal weight gain: Secondary | ICD-10-CM | POA: Insufficient documentation

## 2016-11-09 DIAGNOSIS — R0602 Shortness of breath: Secondary | ICD-10-CM | POA: Insufficient documentation

## 2016-11-09 DIAGNOSIS — L97423 Non-pressure chronic ulcer of left heel and midfoot with necrosis of muscle: Secondary | ICD-10-CM | POA: Insufficient documentation

## 2016-11-09 DIAGNOSIS — M79661 Pain in right lower leg: Secondary | ICD-10-CM | POA: Insufficient documentation

## 2016-11-09 DIAGNOSIS — Z0001 Encounter for general adult medical examination with abnormal findings: Secondary | ICD-10-CM | POA: Insufficient documentation

## 2016-11-09 DIAGNOSIS — E119 Type 2 diabetes mellitus without complications: Secondary | ICD-10-CM | POA: Insufficient documentation

## 2016-11-09 DIAGNOSIS — Z794 Long term (current) use of insulin: Secondary | ICD-10-CM | POA: Insufficient documentation

## 2016-11-09 DIAGNOSIS — Z6841 Body Mass Index (BMI) 40.0 and over, adult: Secondary | ICD-10-CM | POA: Insufficient documentation

## 2016-11-09 DIAGNOSIS — R600 Localized edema: Secondary | ICD-10-CM | POA: Insufficient documentation

## 2016-11-09 DIAGNOSIS — Z7984 Long term (current) use of oral hypoglycemic drugs: Secondary | ICD-10-CM | POA: Insufficient documentation

## 2016-11-09 DIAGNOSIS — R601 Generalized edema: Secondary | ICD-10-CM | POA: Insufficient documentation

## 2016-11-09 DIAGNOSIS — E11621 Type 2 diabetes mellitus with foot ulcer: Secondary | ICD-10-CM | POA: Insufficient documentation

## 2016-11-09 DIAGNOSIS — Z89432 Acquired absence of left foot: Secondary | ICD-10-CM

## 2016-11-09 DIAGNOSIS — I480 Paroxysmal atrial fibrillation: Secondary | ICD-10-CM | POA: Insufficient documentation

## 2016-11-09 DIAGNOSIS — Z7901 Long term (current) use of anticoagulants: Secondary | ICD-10-CM | POA: Insufficient documentation

## 2016-11-09 LAB — CBC
HCT: 41.6 % (ref 39.0–52.0)
Hemoglobin: 13.2 g/dL (ref 13.0–17.0)
MCH: 29.7 pg (ref 26.0–34.0)
MCHC: 31.7 g/dL (ref 30.0–36.0)
MCV: 93.7 fL (ref 78.0–100.0)
PLATELETS: 194 10*3/uL (ref 150–400)
RBC: 4.44 MIL/uL (ref 4.22–5.81)
RDW: 14 % (ref 11.5–15.5)
WBC: 6.1 10*3/uL (ref 4.0–10.5)

## 2016-11-09 LAB — CBC WITH DIFFERENTIAL/PLATELET
BASOS PCT: 0 %
Basophils Absolute: 0 cells/uL (ref 0–200)
EOS PCT: 3 %
Eosinophils Absolute: 183 cells/uL (ref 15–500)
HCT: 41.5 % (ref 38.5–50.0)
HEMOGLOBIN: 13.5 g/dL (ref 13.2–17.1)
LYMPHS ABS: 915 {cells}/uL (ref 850–3900)
Lymphocytes Relative: 15 %
MCH: 30.1 pg (ref 27.0–33.0)
MCHC: 32.5 g/dL (ref 32.0–36.0)
MCV: 92.6 fL (ref 80.0–100.0)
MPV: 9.9 fL (ref 7.5–12.5)
Monocytes Absolute: 488 cells/uL (ref 200–950)
Monocytes Relative: 8 %
NEUTROS PCT: 74 %
Neutro Abs: 4514 cells/uL (ref 1500–7800)
Platelets: 201 10*3/uL (ref 140–400)
RBC: 4.48 MIL/uL (ref 4.20–5.80)
RDW: 13.6 % (ref 11.0–15.0)
WBC: 6.1 10*3/uL (ref 3.8–10.8)

## 2016-11-09 LAB — BASIC METABOLIC PANEL
ANION GAP: 8 (ref 5–15)
BUN: 17 mg/dL (ref 6–20)
CALCIUM: 9.1 mg/dL (ref 8.9–10.3)
CO2: 24 mmol/L (ref 22–32)
CREATININE: 1.1 mg/dL (ref 0.61–1.24)
Chloride: 104 mmol/L (ref 101–111)
GFR calc Af Amer: >60 mL/min (ref >60)
GLUCOSE: 126 mg/dL — AB (ref 65–99)
Potassium: 4.3 mmol/L (ref 3.5–5.1)
Sodium: 136 mmol/L (ref 135–145)

## 2016-11-09 LAB — I-STAT TROPONIN, ED: TROPONIN I, POC: 0.01 ng/mL (ref 0.00–0.08)

## 2016-11-09 LAB — BRAIN NATRIURETIC PEPTIDE: B Natriuretic Peptide: 328.3 pg/mL — ABNORMAL HIGH (ref 0.0–100.0)

## 2016-11-09 MED ORDER — GLIPIZIDE 5 MG PO TABS: 5.0000 mg | ORAL_TABLET | Freq: Two times a day (BID) | ORAL | 3 refills | Status: DC

## 2016-11-09 MED ORDER — LISINOPRIL 40 MG PO TABS: 40.0000 mg | ORAL_TABLET | Freq: Every day | ORAL | 3 refills | Status: DC

## 2016-11-09 MED ORDER — FUROSEMIDE 40 MG PO TABS: 40.0000 mg | ORAL_TABLET | Freq: Every day | ORAL | 3 refills | Status: DC

## 2016-11-09 MED FILL — glipiZIDE 5 MG TABS: 5 | 30 days supply | Qty: 60 | Fill #0

## 2016-11-09 MED FILL — ?FUROSEMIDE 40 MG TABLET: 40 | 30 days supply | Qty: 30 | Fill #0

## 2016-11-09 NOTE — Progress Notes (Signed)
Subjective:  Patient ID: Paul Mclaughlin, male    DOB: Apr 21, 1956  Age: 61 y.o. 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 He was commenced on hydralazine at his last visit but his blood pressure is still elevated.  He was last seen by orthopedics on 10/26/16 and is status post debridement of left foot ulcer and increased to ulceration. He continues daily wound dressing changes with Silvadene and is scheduled for follow-up in 2 weeks. He denies fevers.  He does have swelling of both legs which is worse as the day progresses and resolves when he lies down at night. Endorses shortness of breath, fatigue and significant weight gain-51 pounds in the last 1 month. States he has not increased his appetite in any way but has not been on the feet.  He has been compliant with his Lantus and had an occasion where his blood sugar was 70.  Past Medical History:  Diagnosis Date  . Cellulitis and abscess of left leg 06/2016  . Diabetes [facility])     Past Surgical History:  Procedure Laterality Date  . AMPUTATION Left 06/24/2016   Procedure: FOOT FIFTH RAY TOE AMPUTATION;  Surgeon: Newt Minion, MD;  Location: [place];  Service: Orthopedics;  Laterality: Left;  . open heart surgery     As a child.  13 years old.  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     No Known Allergies   Outpatient Medications Prior to Visit  Medication Sig Dispense Refill  . albuterol (PROVENTIL HFA;VENTOLIN HFA) 108 (90 Base) MCG/ACT inhaler Inhale 2 puffs into the lungs every 6 (six) hours as needed for wheezing or shortness of breath. 1 Inhaler 0  . atorvastatin (LIPITOR) 20 MG tablet Take 1  tablet (20 mg total) by mouth daily. 30 tablet 3  . blood glucose meter kit and supplies KIT Dispense based on patient and insurance preference. Use up to four times daily as directed. (FOR ICD-9 250.00, 250.01). 1 each 0  . Blood Glucose Monitoring Suppl (TRUE METRIX METER) w/Device KIT Use as directed 1 kit 0  . ciclopirox (PENLAC) 8 % solution Apply topically at bedtime. Apply over nail and surrounding skin. After seven (7) days, may remove with alcohol and continue cycle. 6.6 mL 1  . gabapentin (NEURONTIN) 300 MG capsule Take 1 capsule (300 mg total) by mouth 2 (two) times daily. 60 capsule 3  . glucose blood (TRUE METRIX BLOOD GLUCOSE TEST) test strip Use as instructed 100 each 12  . hydrALAZINE (APRESOLINE) 25 MG tablet Take 1 tablet (25 mg total) by mouth 3 (three) times daily. 90 tablet 3  . metFORMIN (GLUCOPHAGE) 500 MG tablet Take 1 tablet (500 mg total) by mouth 2 (two) times daily with a meal. 60 tablet 3  . metoprolol (LOPRESSOR) 50 MG tablet TAKE 1 TABLET BY MOUTH 2 TIMES DAILY. 60 tablet 3  . nitroGLYCERIN (NITRODUR - DOSED IN MG/24 HR) 0.2 mg/hr patch Place 1 patch (0.2 mg total) onto the skin daily. Change the patch location daily applied to top of foot. 30 patch 3  . rivaroxaban (XARELTO) 20 MG TABS tablet Take 1 tablet (20 mg total) by mouth daily with supper. Mineral  tablet 3  . silver sulfADIAZINE (SILVADENE) 1 % cream Apply 1 application topically daily. 400 g 3  . TRUEPLUS INSULIN SYRINGE 30G X 5/16" 0.3 ML MISC USE AS DIRECTED 100 each 5  . TRUEPLUS LANCETS 28G MISC Use as directed 100 each 5  . insulin glargine (LANTUS) 100 UNIT/ML injection Inject 0.15 mLs (15 Units total) into the skin at bedtime. 30 mL 3  . lisinopril (PRINIVIL,ZESTRIL) 20 MG tablet Take 1 tablet (20 mg total) by mouth daily. 30 tablet 3   No facility-administered medications prior to visit.     ROS Review of Systems  Constitutional: Negative for activity change and appetite change.  HENT: Negative for  sinus pressure and sore throat.   Eyes: Negative for visual disturbance.  Respiratory: Positive for shortness of breath. Negative for cough and chest tightness.   Cardiovascular: Positive for leg swelling. Negative for chest pain.  Gastrointestinal: Negative for abdominal distention, abdominal pain, constipation and diarrhea.  Endocrine: Negative.   Genitourinary: Negative for dysuria.  Musculoskeletal: Negative for joint swelling and myalgias.  Skin: Positive for wound. Negative for rash.  Allergic/Immunologic: Negative.   Neurological: Negative for weakness, light-headedness and numbness.  Psychiatric/Behavioral: Negative for dysphoric mood and suicidal ideas.    Objective:  BP (!) 181/90 (BP Location: Right Arm, Patient Position: Sitting, Cuff Size: Small)   Pulse 66   Temp 97.6 F (36.4 C) (Oral)   Ht 6' 1" (1.854 m)   Wt (!) 342 lb 9.6 oz (155.4 kg)   SpO2 95%   BMI 45.20 kg/m   BP/Weight 11/09/2016 10/26/2016 1/61/0960  Systolic BP 454 - 098  Diastolic BP 90 - 119  Wt. (Lbs) 342.6 291 291.6  BMI 45.2 38.39 38.47      Physical Exam  Constitutional: He is oriented to person, place, and time. He appears well-developed and well-nourished.  Obese  Cardiovascular: Normal rate, normal heart sounds and intact distal pulses.   No murmur heard. Pulmonary/Chest: Effort normal and breath sounds normal. He has no wheezes. He has no rales. He exhibits no tenderness.  Abdominal: Soft. Bowel sounds are normal. He exhibits no distension and no mass. There is tenderness (with evidence of ascites).  Musculoskeletal: Normal range of motion. He exhibits edema and tenderness (3+ pitting pedal edema up to thighs bilaterally).  Left great toe eschar on the plantar aspect with no discharge. Lateral aspect of the left foot with linear wound overlying the region of the left fifth toe ray amputation with fibrinous discharge which is yellowish, no foul odor.  Neurological: He is alert and oriented  to person, place, and time.     Lab Results  Component Value Date   HGBA1C 5.9 10/04/2016    Assessment & Plan:   1. Essential hypertension Uncontrolled Increased dose of lisinopril - lisinopril (PRINIVIL,ZESTRIL) 40 MG tablet; Take 1 tablet (40 mg total) by mouth daily.  Dispense: 30 tablet; Refill: 3 - COMPLETE METABOLIC PANEL WITH GFR  2. Foot amputation status, left ([place]) Status post left fifth ray amputation  3. Diabetic ulcer of left midfoot associated with type 2 diabetes mellitus, with necrosis of muscle (HCC) Controlled with A1c of 5.9 Discontinue Lantus in the event that he could be contributing to his edema Comments glipizide - glipiZIDE (GLUCOTROL) 5 MG tablet; Take 1 tablet (5 mg total) by mouth 2 (two) times daily before a meal.  Dispense: 60 tablet; Refill: 3  4. Paroxysmal atrial fibrillation (HCC) Rate control on metoprolol Anticoagulation with Xarelto  5. Weight  gain Weight gain of 51 pounds in the last 1 month (was 291 on 10/04/16 and is 342 today) 2-D echo from 06/2016 revealed EF of 55-60%, mildly reduced systolic function - furosemide (LASIX) 40 MG tablet; Take 1 tablet (40 mg total) by mouth daily.  Dispense: 30 tablet; Refill: 3 - Brain natriuretic peptide - ECHOCARDIOGRAM COMPLETE; Future  6. Generalized edema He does have significant edema which is concerning Commenced on Lasix and we'll check potassium today and again at his next visit Will refer to cardiology if this persists at his next visit - furosemide (LASIX) 40 MG tablet; Take 1 tablet (40 mg total) by mouth daily.  Dispense: 30 tablet; Refill: 3 - ECHOCARDIOGRAM COMPLETE; Future  7. Other form of dyspnea Could be due to excessive weight gain We'll evaluate for other causes - DG Chest 2 View; Future - CBC with Differential   Meds ordered this encounter  Medications  . lisinopril (PRINIVIL,ZESTRIL) 40 MG tablet    Sig: Take 1 tablet (40 mg total) by mouth daily.    Dispense:  30  tablet    Refill:  3    Discontinue previous dose  . furosemide (LASIX) 40 MG tablet    Sig: Take 1 tablet (40 mg total) by mouth daily.    Dispense:  30 tablet    Refill:  3  . glipiZIDE (GLUCOTROL) 5 MG tablet    Sig: Take 1 tablet (5 mg total) by mouth 2 (two) times daily before a meal.    Dispense:  60 tablet    Refill:  3    Discontinue Lantus.    Follow-up: Return in about 2 weeks (around 11/23/2016) for Follow-up on hypertension and weight gain.   Arnoldo Morale MD

## 2016-11-09 NOTE — ED Triage Notes (Signed)
Pt states went to his pcp for bp check-up and they noted that his weight had increased by 50 lbs in the last month.  States increased dyspnea on exertion, increased abdominal and bil LE swelling.  Denies chest pain.  Recent L toe amputation in Oct.

## 2016-11-09 NOTE — ED Provider Notes (Signed)
[place] DEPT Provider Note   CSN: [id number] Arrival date & time: 11/09/16  1727     History   Chief Complaint Chief Complaint  Patient presents with  . Shortness of Breath  . Weight Gain    HPI Paul Mclaughlin is a 61 y.o. male.   Pt reports 50 lbs weight gain over the last month. He saw PCP who ordered outpatient echo and and CXR. He thought he was supposed to come to the Ed. PCP has started him on Lasix 19m today.   The history is provided by the patient.  Shortness of Breath  This is a new problem. The average episode lasts 1 month. The current episode started more than 1 week ago. The problem has been gradually worsening. Associated symptoms include leg swelling. Pertinent negatives include no fever, no sore throat, no ear pain, no cough, no orthopnea, no chest pain, no vomiting, no abdominal pain and no rash. He has tried nothing for the symptoms. The treatment provided no relief.    Past Medical History:  Diagnosis Date  . Cellulitis and abscess of left leg 06/2016  . Diabetes [facility]     Patient Active Problem List   Diagnosis Date Noted  . Foot amputation status, left ([facility] 08/22/2016  . Diabetic ulcer of foot with necrosis of muscle ([facility] 08/02/2016  . Atrial fibrillation ([facility] 06/30/2016  . Essential hypertension 06/30/2016  . Cellulitis of left foot 06/22/2016  . Controlled type 2 diabetes mellitus with hyperglycemia ([facility] 06/22/2016    Past Surgical History:  Procedure Laterality Date  . AMPUTATION Left 06/24/2016   Procedure: FOOT FIFTH RAY TOE AMPUTATION;  Surgeon: MNewt Minion MD;  [place]  Service: Orthopedics;  Laterality: Left;  . open heart surgery     As a child.  F27years old.  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        Home Medications    Prior to Admission medications   Medication Sig Start Date End Date Taking? Authorizing Provider  albuterol (PROVENTIL HFA;VENTOLIN HFA) 108 (90 Base) MCG/ACT inhaler Inhale 2 puffs into the lungs every 6  (six) hours as needed for wheezing or shortness of breath. 09/07/16   EArnoldo Morale MD  atorvastatin (LIPITOR) 20 MG tablet Take 1 tablet (20 mg total) by mouth daily. 08/02/16   EArnoldo Morale MD  blood glucose meter kit and supplies KIT Dispense based on patient and insurance preference. Use up to four times daily as directed. (FOR ICD-9 250.00, 250.01). 06/27/16   Belkys A Regalado, MD  Blood Glucose Monitoring Suppl (TRUE METRIX METER) w/Device KIT Use as directed 07/06/16   EArnoldo Morale MD  ciclopirox (PENLAC) 8 % solution Apply topically at bedtime. Apply over nail and surrounding skin. After seven (7) days, may remove with alcohol and continue cycle. 10/04/16   EArnoldo Morale MD  furosemide (LASIX) 40 MG tablet Take 1 tablet (40 mg total) by mouth daily. 11/09/16   EArnoldo Morale MD  gabapentin (NEURONTIN) 300 MG capsule Take 1 capsule (300 mg total) by mouth 2 (two) times daily. 08/02/16   EArnoldo Morale MD  glipiZIDE (GLUCOTROL) 5 MG tablet Take 1 tablet (5 mg total) by mouth 2 (two) times daily before a meal. 11/09/16   EArnoldo Morale MD  glucose blood (TRUE METRIX BLOOD GLUCOSE TEST) test strip Use as instructed 07/06/16   EArnoldo Morale MD  hydrALAZINE (APRESOLINE) 25 MG tablet Take 1 tablet (25 mg total) by mouth 3 (three) times daily. 10/04/16   EArnoldo Morale MD  lisinopril (PRINIVIL,ZESTRIL) 40 MG tablet Take 1 tablet (40 mg total) by mouth daily. 11/09/16   Arnoldo Morale, MD  metFORMIN (GLUCOPHAGE) 500 MG tablet Take 1 tablet (500 mg total) by mouth 2 (two) times daily with a meal. 08/02/16   Arnoldo Morale, MD  metoprolol (LOPRESSOR) 50 MG tablet TAKE 1 TABLET BY MOUTH 2 TIMES DAILY. 10/24/16   Arnoldo Morale, MD  nitroGLYCERIN (NITRODUR - DOSED IN MG/24 HR) 0.2 mg/hr patch Place 1 patch (0.2 mg total) onto the skin daily. Change the patch location daily applied to top of foot. 10/26/16   Suzan Slick, NP  rivaroxaban (XARELTO) 20 MG TABS tablet Take 1 tablet (20 mg total) by mouth daily with supper.  07/13/16   Arnoldo Morale, MD  silver sulfADIAZINE (SILVADENE) 1 % cream Apply 1 application topically daily. 08/08/16   Newt Minion, MD  TRUEPLUS INSULIN SYRINGE 30G X 5/16" 0.3 ML MISC USE AS DIRECTED 10/10/16   Arnoldo Morale, MD  TRUEPLUS LANCETS 28G MISC Use as directed 07/06/16   Arnoldo Morale, MD    Family History Family History  Problem Relation Age of Onset  . Diabetes Mellitus II Mother     ESRD  . Diabetes Mellitus II Brother   . Emphysema Father     Social History Social History  Substance Use Topics  . Smoking status: Never Smoker  . Smokeless tobacco: Never Used  . Alcohol use No     Comment: gave up drinking ~20 years ago     Allergies   Patient has no known allergies.   Review of Systems Review of Systems  Constitutional: Negative for chills and fever.  HENT: Negative for ear pain and sore throat.   Eyes: Negative for pain and visual disturbance.  Respiratory: Positive for shortness of breath. Negative for cough.   Cardiovascular: Positive for leg swelling. Negative for chest pain, palpitations and orthopnea.  Gastrointestinal: Positive for abdominal distention. Negative for abdominal pain and vomiting.  Genitourinary: Negative for dysuria and hematuria.  Musculoskeletal: Negative for arthralgias and back pain.  Skin: Negative for color change and rash.  Neurological: Negative for seizures and syncope.  All other systems reviewed and are negative.    Physical Exam Updated Vital Signs BP 200/99   Pulse 64   Temp 97.6 F (36.4 C) (Oral)   Resp 16   SpO2 98%   Physical Exam  Constitutional: He is oriented to person, place, and time. He appears well-developed and well-nourished.  HENT:  Head: Normocephalic and atraumatic.  Eyes: Conjunctivae are normal.  Neck: Neck supple.  Cardiovascular: Normal rate and regular rhythm.   No murmur heard. Pulmonary/Chest: Effort normal and breath sounds normal. No respiratory distress. He has no wheezes. He has no  rales.  Abdominal: Soft. He exhibits distension. He exhibits no mass. There is no tenderness. There is no rebound and no guarding.  Musculoskeletal: He exhibits edema (2+ pitting edema throughout b/l lower extremities) and deformity. He exhibits no tenderness.  Left partial foot amputation  Neurological: He is alert and oriented to person, place, and time.  Skin: Skin is warm and dry.  Psychiatric: He has a normal mood and affect.  Nursing note and vitals reviewed.    ED Treatments / Results  Labs (all labs ordered are listed, but only abnormal results are displayed) Labs Reviewed  BASIC METABOLIC PANEL - Abnormal; Notable for the following:       Result Value   Glucose, Bld 126 (*)    All  other components within normal limits  BRAIN NATRIURETIC PEPTIDE - Abnormal; Notable for the following:    B Natriuretic Peptide 328.3 (*)    All other components within normal limits  CBC  I-STAT TROPOININ, ED    EKG  EKG Interpretation  Date/Time:  Wednesday November 09 2016 17:49:30 EST Ventricular Rate:  66 PR Interval:    QRS Duration: 110 QT Interval:  416 QTC Calculation: 436 R Axis:   75 Text Interpretation:  Atrial fibrillation Cannot rule out Inferior infarct , age undetermined Abnormal ECG When compared with ECG of 06/24/2016, there has been a loss of QRS amplitude in the anterolateral leads Confirmed by [facility]  MD, DAVID ([id number]) on 11/09/2016 5:51:29 PM       Radiology Dg Chest 2 View  Result Date: 11/09/2016 CLINICAL DATA:  Shortness of breath.  Weight gain. EXAM: CHEST  2 VIEW COMPARISON:  Frontal and lateral views 06/22/2016 FINDINGS: The heart is enlarged. Stable mediastinal contours from prior. There are small bilateral pleural effusions. Vascular congestion and peribronchial cuffing consistent with edema. No confluent airspace disease. No pneumothorax. No acute osseous abnormality is seen. IMPRESSION: CHF. Cardiomegaly with small pleural effusions, vascular congestion and  mild pulmonary edema. Electronically Signed   By: Jeb Levering M.D.   On: 11/09/2016 18:05    Procedures Procedures (including critical care time)  Medications Ordered in ED Medications - No data to display   Initial Impression / Assessment and Plan / ED Course  I have reviewed the triage vital signs and the nursing notes.  Pertinent labs & imaging results that were available during my care of the patient were reviewed by me and considered in my medical decision making (see chart for details).    Pt with hx as above presents with dyspnea on exertion and 50 pound weight gain over the last month. He saw PCP this morning who ordered Echo and CXR as outpatient. Pt reports he mistakenly thought he was supposed to come to Ed. He reports his sx have been stable today. Lungs are clear. His PCP started him on Lasix today which he has not yet taken. He has peripheral edema as above. CXR c/w CHF and mild pulm edema. Pt is oxygenating well here. BNP 328, labs otherwise unremarkable. I suspect he does have new onset CHF. He is currently stable and I think the plan his PCP initiated today is appropriate. He has f/u in 2 weeks. Advised pt to start lasix as directed. Return precautions discussed in detail. Pt in agreement with plan. Discharged in stable condition.  Final Clinical Impressions(s) / ED Diagnoses   Final diagnoses:  Weight gain  Shortness of breath    New Prescriptions Discharge Medication List as of 11/09/2016  7:30 PM       Clifton James, MD 43/60/67 [id number]    Delora Fuel, MD 03/52/48 [id number]

## 2016-11-10 LAB — COMPLETE METABOLIC PANEL WITH GFR
ALBUMIN: 3.6 g/dL (ref 3.6–5.1)
ALK PHOS: 143 U/L — AB (ref 40–115)
ALT: 15 U/L (ref 9–46)
AST: 22 U/L (ref 10–35)
BILIRUBIN TOTAL: 1.2 mg/dL (ref 0.2–1.2)
BUN: 18 mg/dL (ref 7–25)
CO2: 23 mmol/L (ref 20–31)
Calcium: 9.1 mg/dL (ref 8.6–10.3)
Chloride: 103 mmol/L (ref 98–110)
Creat: 1.13 mg/dL (ref 0.70–1.33)
GFR, EST AFRICAN AMERICAN: 82 mL/min (ref >=60)
GFR, Est Non African American: 71 mL/min (ref >=60)
Glucose, Bld: 122 mg/dL — ABNORMAL HIGH (ref 65–99)
Potassium: 4.7 mmol/L (ref 3.5–5.3)
Sodium: 138 mmol/L (ref 135–146)
TOTAL PROTEIN: 6.9 g/dL (ref 6.1–8.1)

## 2016-11-10 LAB — POCT URINALYSIS DIPSTICK
Glucose, UA: NEGATIVE
KETONES UA: NEGATIVE
LEUKOCYTES UA: NEGATIVE
Nitrite, UA: NEGATIVE
PH UA: 5.5
Protein, UA: >=300
Urobilinogen, UA: NEGATIVE

## 2016-11-10 LAB — URINALYSIS, MICROSCOPIC ONLY
BACTERIA UA: NONE SEEN [HPF]
Casts: NONE SEEN [LPF]
Crystals: NONE SEEN [HPF]
Yeast: NONE SEEN [HPF]

## 2016-11-10 LAB — BRAIN NATRIURETIC PEPTIDE: BRAIN NATRIURETIC PEPTIDE: 383.3 pg/mL — AB (ref <100)

## 2016-11-10 NOTE — Addendum Note (Signed)
Addended by: Quentin AngstLIEB, KAY E on: 11/10/2016 12:15 PM   Modules accepted: Orders

## 2016-11-14 ENCOUNTER — Inpatient Hospital Stay (HOSPITAL_COMMUNITY): Admission: RE | Admit: 2016-11-14 | Payer: Self-pay | Source: Ambulatory Visit

## 2016-11-15 MED FILL — LISINOPRIL 40 MG TABLET: 40 | 30 days supply | Qty: 30 | Fill #0

## 2016-11-23 ENCOUNTER — Other Ambulatory Visit: Payer: Self-pay | Admitting: Family Medicine

## 2016-11-23 DIAGNOSIS — Z794 Long term (current) use of insulin: Principal | ICD-10-CM

## 2016-11-23 DIAGNOSIS — E1149 Type 2 diabetes mellitus with other diabetic neurological complication: Secondary | ICD-10-CM

## 2016-11-23 MED FILL — ATORVASTATIN 20 MG TABLET: 20 | 30 days supply | Qty: 30 | Fill #0

## 2016-11-23 MED FILL — ?METFORMIN HCL 500MG TABLET: 500 | 30 days supply | Qty: 60 | Fill #3

## 2016-11-23 MED FILL — METOPROLOL TARTRATE 50 MG T: 50 | 30 days supply | Qty: 60 | Fill #1

## 2016-11-24 ENCOUNTER — Encounter: Payer: Self-pay | Admitting: Family Medicine

## 2016-11-24 ENCOUNTER — Ambulatory Visit: Payer: Self-pay | Attending: Family Medicine | Admitting: Family Medicine

## 2016-11-24 VITALS — BP 120/74 | HR 64 | Temp 98.0°F | Ht 73.0 in | Wt 341.2 lb

## 2016-11-24 DIAGNOSIS — IMO0002 Reserved for concepts with insufficient information to code with codable children: Secondary | ICD-10-CM

## 2016-11-24 DIAGNOSIS — I110 Hypertensive heart disease with heart failure: Secondary | ICD-10-CM | POA: Insufficient documentation

## 2016-11-24 DIAGNOSIS — R635 Abnormal weight gain: Secondary | ICD-10-CM | POA: Insufficient documentation

## 2016-11-24 DIAGNOSIS — E1165 Type 2 diabetes mellitus with hyperglycemia: Secondary | ICD-10-CM | POA: Insufficient documentation

## 2016-11-24 DIAGNOSIS — I509 Heart failure, unspecified: Secondary | ICD-10-CM | POA: Insufficient documentation

## 2016-11-24 DIAGNOSIS — I480 Paroxysmal atrial fibrillation: Secondary | ICD-10-CM | POA: Insufficient documentation

## 2016-11-24 DIAGNOSIS — I10 Essential (primary) hypertension: Secondary | ICD-10-CM

## 2016-11-24 DIAGNOSIS — Z89432 Acquired absence of left foot: Secondary | ICD-10-CM

## 2016-11-24 DIAGNOSIS — Z7984 Long term (current) use of oral hypoglycemic drugs: Secondary | ICD-10-CM | POA: Insufficient documentation

## 2016-11-24 DIAGNOSIS — Z89439 Acquired absence of unspecified foot: Secondary | ICD-10-CM | POA: Insufficient documentation

## 2016-11-24 DIAGNOSIS — Z5189 Encounter for other specified aftercare: Secondary | ICD-10-CM | POA: Insufficient documentation

## 2016-11-24 DIAGNOSIS — Z6841 Body Mass Index (BMI) 40.0 and over, adult: Secondary | ICD-10-CM | POA: Insufficient documentation

## 2016-11-24 DIAGNOSIS — G4700 Insomnia, unspecified: Secondary | ICD-10-CM | POA: Insufficient documentation

## 2016-11-24 DIAGNOSIS — L97529 Non-pressure chronic ulcer of other part of left foot with unspecified severity: Secondary | ICD-10-CM | POA: Insufficient documentation

## 2016-11-24 DIAGNOSIS — G4709 Other insomnia: Secondary | ICD-10-CM

## 2016-11-24 DIAGNOSIS — Z79899 Other long term (current) drug therapy: Secondary | ICD-10-CM | POA: Insufficient documentation

## 2016-11-24 DIAGNOSIS — I5030 Unspecified diastolic (congestive) heart failure: Secondary | ICD-10-CM | POA: Insufficient documentation

## 2016-11-24 LAB — COMPLETE METABOLIC PANEL WITH GFR
ALT: 12 U/L (ref 9–46)
AST: 20 U/L (ref 10–35)
Albumin: 3.6 g/dL (ref 3.6–5.1)
Alkaline Phosphatase: 160 U/L — ABNORMAL HIGH (ref 40–115)
BILIRUBIN TOTAL: 1.3 mg/dL — AB (ref 0.2–1.2)
BUN: 23 mg/dL (ref 7–25)
CO2: 28 mmol/L (ref 20–31)
Calcium: 9.2 mg/dL (ref 8.6–10.3)
Chloride: 103 mmol/L (ref 98–110)
Creat: 1.02 mg/dL (ref 0.70–1.33)
GFR, EST NON AFRICAN AMERICAN: 80 mL/min (ref >=60)
GFR, Est African American: >89 mL/min (ref >=60)
GLUCOSE: 72 mg/dL (ref 65–99)
Potassium: 4.2 mmol/L (ref 3.5–5.3)
SODIUM: 140 mmol/L (ref 135–146)
TOTAL PROTEIN: 6.8 g/dL (ref 6.1–8.1)

## 2016-11-24 LAB — GLUCOSE, POCT (MANUAL RESULT ENTRY): POC GLUCOSE: 85 mg/dL (ref 70–99)

## 2016-11-24 MED ORDER — TRAZODONE HCL 100 MG PO TABS: 100.0000 mg | ORAL_TABLET | Freq: Every evening | ORAL | 3 refills | Status: DC | PRN

## 2016-11-24 MED ORDER — POTASSIUM CHLORIDE CRYS ER 20 MEQ PO TBCR: 20.0000 meq | EXTENDED_RELEASE_TABLET | Freq: Every day | ORAL | 3 refills | Status: DC

## 2016-11-24 MED ORDER — FUROSEMIDE 40 MG PO TABS: 40.0000 mg | ORAL_TABLET | Freq: Two times a day (BID) | ORAL | 3 refills | Status: DC

## 2016-11-24 MED ORDER — FUROSEMIDE 40 MG PO TABS: 40.0000 mg | ORAL_TABLET | Freq: Every day | ORAL | 3 refills | Status: DC

## 2016-11-24 MED FILL — ?TRAZODONE 100 MG TABLET: 100 MG | 30 days supply | Qty: 30 | Fill #0

## 2016-11-24 MED FILL — XARELTO 20 MG TABLET: 20 | 30 days supply | Qty: 30 | Fill #4

## 2016-11-24 MED FILL — POTASSIUM CL ER 20 MEQ TAB: 20 | 30 days supply | Qty: 30 | Fill #0

## 2016-11-24 MED FILL — ?FUROSEMIDE 40 MG TABLET: 40 | 30 days supply | Qty: 60 | Fill #0

## 2016-11-24 NOTE — Progress Notes (Signed)
Subjective:  Patient ID: Paul Mclaughlin, male    DOB: 1956/05/08  Age: 61 y.o. 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 He was commenced on Lasix 40 mg daily and has lost 2 pounds in the last 2 weeks. Referred for a chest x-ray and 2-D echo; chest x-ray revealed CHF, cardiomegaly with small pleural effusions, vascular congestion and mild pulmonary edema. He no showed for his 2-D echo appointment.  He does have swelling of both legs which is worse as the day progresses and resolves when he lies down at night. Endorses shortness of breath, fatigue and felt bloated yesterday. States he has not increased his appetite in any way.  He was last seen by orthopedics on 10/26/16 and is status post debridement of left foot ulcer and increased to ulceration. He continues daily wound dressing changes with Silvadene and is scheduled for follow-up in 2 weeks. He denies fevers.  Past Medical History:  Diagnosis Date  . Cellulitis and abscess of left leg 06/2016  . Diabetes [facility])     Past Surgical History:  Procedure Laterality Date  . AMPUTATION Left 06/24/2016   Procedure: FOOT FIFTH RAY TOE AMPUTATION;  Surgeon: Newt Minion, MD;  Location: [place];  Service: Orthopedics;  Laterality: Left;  . open heart surgery     As a child.  65 years old.  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     No Known Allergies   Outpatient Medications Prior to Visit  Medication Sig Dispense Refill  . atorvastatin (LIPITOR) 20 MG tablet TAKE 1 TABLET BY MOUTH DAILY. 30 tablet 3  . blood glucose meter kit and supplies KIT Dispense based on patient and insurance preference. Use up to four times daily as directed. (FOR  ICD-9 250.00, 250.01). 1 each 0  . Blood Glucose Monitoring Suppl (TRUE METRIX METER) w/Device KIT Use as directed 1 kit 0  . gabapentin (NEURONTIN) 300 MG capsule Take 1 capsule (300 mg total) by mouth 2 (two) times daily. 60 capsule 3  . glipiZIDE (GLUCOTROL) 5 MG tablet Take 1 tablet (5 mg total) by mouth 2 (two) times daily before a meal. 60 tablet 3  . glucose blood (TRUE METRIX BLOOD GLUCOSE TEST) test strip Use as instructed 100 each 12  . hydrALAZINE (APRESOLINE) 25 MG tablet Take 1 tablet (25 mg total) by mouth 3 (three) times daily. 90 tablet 3  . lisinopril (PRINIVIL,ZESTRIL) 40 MG tablet Take 1 tablet (40 mg total) by mouth daily. 30 tablet 3  . metFORMIN (GLUCOPHAGE) 500 MG tablet Take 1 tablet (500 mg total) by mouth 2 (two) times daily with a meal. 60 tablet 3  . metoprolol (LOPRESSOR) 50 MG tablet TAKE 1 TABLET BY MOUTH 2 TIMES DAILY. 60 tablet 3  . nitroGLYCERIN (NITRODUR - DOSED IN MG/24 HR) 0.2 mg/hr patch Place 1 patch (0.2 mg total) onto the skin daily. Change the patch location daily applied to top of foot. 30 patch 3  . rivaroxaban (XARELTO) 20 MG TABS tablet Take 1 tablet (20 mg total) by mouth daily with supper. 90 tablet 3  . silver sulfADIAZINE (SILVADENE) 1 % cream Apply 1 application topically daily. 400 g 3  . TRUEPLUS INSULIN SYRINGE  30G X 5/16" 0.3 ML MISC USE AS DIRECTED 100 each 5  . TRUEPLUS LANCETS 28G MISC Use as directed 100 each 5  . furosemide (LASIX) 40 MG tablet Take 1 tablet (40 mg total) by mouth daily. 30 tablet 3  . albuterol (PROVENTIL HFA;VENTOLIN HFA) 108 (90 Base) MCG/ACT inhaler Inhale 2 puffs into the lungs every 6 (six) hours as needed for wheezing or shortness of breath. (Patient not taking: Reported on 11/24/2016) 1 Inhaler 0  . ciclopirox (PENLAC) 8 % solution Apply topically at bedtime. Apply over nail and surrounding skin. After seven (7) days, may remove with alcohol and continue cycle. (Patient not taking: Reported on 11/24/2016) 6.6 mL 1    No facility-administered medications prior to visit.     ROS Review of Systems Constitutional: Negative for activity change and appetite change.  positive for weight gain HENT: Negative for sinus pressure and sore throat.   Eyes: Negative for visual disturbance.  Respiratory: Positive for shortness of breath. Negative for cough and chest tightness.   Cardiovascular: Positive for leg swelling. Negative for chest pain.  Gastrointestinal: Negative for abdominal distention, abdominal pain, constipation and diarrhea.  Endocrine: Negative.   Genitourinary: Negative for dysuria.  Musculoskeletal: Negative for joint swelling and myalgias.  Skin: Positive for wound. Negative for rash.  Allergic/Immunologic: Negative.   Neurological: Negative for weakness, light-headedness and numbness.  Psychiatric/Behavioral: Negative for dysphoric mood and suicidal ideas.   Objective:  BP 120/74 (BP Location: Right Arm, Patient Position: Sitting, Cuff Size: Large)   Pulse 64   Temp 98 F (36.7 C) (Oral)   Ht 6' 1"  (1.854 m)   Wt (!) 341 lb 3.2 oz (154.8 kg)   SpO2 93%   BMI 45.02 kg/m   BP/Weight 11/24/2016 11/09/2016 9/52/8413  Systolic BP 244 010 272  Diastolic BP 74 99 90  Wt. (Lbs) 341.2 - 342.6  BMI 45.02 - 45.2      Physical Exam Constitutional: He is oriented to person, place, and time. He appears well-developed and well-nourished.  Obese  Cardiovascular: Normal rate, normal heart sounds and intact distal pulses.   No murmur heard. Pulmonary/Chest: Effort normal and breath sounds normal. He has no wheezes. He has no rales. He exhibits no tenderness.  Abdominal: Soft. Bowel sounds are normal. He exhibits no distension and no mass. There is tenderness (with evidence of ascites).  Musculoskeletal: Normal range of motion. He exhibits edema and tenderness (3+ pitting pedal edema up to thighs bilaterally).  Left great toe eschar on the plantar aspect with no discharge. Lateral aspect of  the left foot with linear wound overlying the region of the left fifth toe ray amputation with fibrinous discharge which is yellowish, no foul odor.  Neurological: He is alert and oriented to person, place, and time.   Lab Results  Component Value Date   HGBA1C 5.9 10/04/2016    CMP Latest Ref Rng & Units 11/09/2016 11/09/2016 10/04/2016  Glucose 65 - 99 mg/dL 126(H) 122(H) 113(H)  BUN 6 - 20 mg/dL 17 18 17   Creatinine 0.61 - 1.24 mg/dL 1.10 1.13 0.94  Sodium 135 - 145 mmol/L 136 138 135  Potassium 3.5 - 5.1 mmol/L 4.3 4.7 4.4  Chloride 101 - 111 mmol/L 104 103 102  CO2 22 - 32 mmol/L 24 23 25   Calcium 8.9 - 10.3 mg/dL 9.1 9.1 9.2  Total Protein 6.1 - 8.1 g/dL - 6.9 6.6  Total Bilirubin 0.2 - 1.2 mg/dL - 1.2 1.2  Alkaline Phos 40 -  115 U/L - 143(H) 118(H)  AST 10 - 35 U/L - 22 17  ALT 9 - 46 U/L - 15 13    BNP (last 3 results)  Recent Labs  10/04/16 1537 11/09/16 1626 11/09/16 1754  BNP 225.7* 383.3* 328.3*      Assessment & Plan:   1. Controlled type 2 diabetes mellitus with hyperglycemia, without long-term current use of insulin (HCC) Controlled with A1c of 5.9 Diabetic medications - Glucose (CBG)  2. Paroxysmal atrial fibrillation (HCC) Rate control with metoprolol Anticoagulated with Xarelto  3. Foot amputation status, left ([place]) Left foot ulcer Continue dressing changes Keep appointment with orthopedics  4. Congestive heart failure, unspecified congestive heart failure chronicity, unspecified congestive heart failure type ([place]) We have scheduled another appointment for 2-D echo in 4 days and strongly encouraged compliance Increased dose of Lasix Commenced potassium Restrict fluid intake to less than 2 L a day Daily weight check We'll see back with 2-D echo and determine need for cardiology referral - furosemide (LASIX) 40 MG tablet; Take 1 tablet (40 mg total) by mouth daily.  Dispense: 60 tablet; Refill: 3 - potassium chloride SA (K-DUR,KLOR-CON) 20 MEQ  tablet; Take 1 tablet (20 mEq total) by mouth daily.  Dispense: 30 tablet; Refill: 3 - COMPLETE METABOLIC PANEL WITH GFR - Brain natriuretic peptide  5. Weight gain Could be secondary to CHF Hopefully increasing the Lasix will bring about weight loss  6. Essential hypertension Controlled   Meds ordered this encounter  Medications  . furosemide (LASIX) 40 MG tablet    Sig: Take 1 tablet (40 mg total) by mouth daily.    Dispense:  60 tablet    Refill:  3  . potassium chloride SA (K-DUR,KLOR-CON) 20 MEQ tablet    Sig: Take 1 tablet (20 mEq total) by mouth daily.    Dispense:  30 tablet    Refill:  3    Follow-up: Return in about 5 days (around 11/29/2016) for Follow-up congestive heart failure and weight gain.   Arnoldo Morale MD

## 2016-11-24 NOTE — Patient Instructions (Signed)
Increase furosemide (Lasix) to 40 mg twice daily Commence potassium 20 mEq once daily.    Heart Failure Heart failure is a condition in which the heart has trouble pumping blood because it has become weak or stiff. This means that the heart does not pump blood efficiently for the body to work well. For some people with heart failure, fluid may back up into the lungs and there may be swelling (edema) in the lower legs. Heart failure is usually a long-term (chronic) condition. It is important for you to take good care of yourself and follow the treatment plan from your health care provider. What are the causes? This condition is caused by some health problems, including:  High blood pressure (hypertension). Hypertension causes the heart muscle to work harder than normal. High blood pressure eventually causes the heart to become stiff and weak.  Coronary artery disease (CAD). CAD is the buildup of cholesterol and fat (plaques) in the arteries of the heart.  Heart attack (myocardial infarction). Injured tissue, which is caused by the heart attack, does not contract as well and the heart's ability to pump blood is weakened.  Abnormal heart valves. When the heart valves do not open and close properly, the heart muscle must pump harder to keep the blood flowing.  Heart muscle disease (cardiomyopathy or myocarditis). Heart muscle disease is damage to the heart muscle from a variety of causes, such as drug or alcohol abuse, infections, or unknown causes. These can increase the risk of heart failure.  Lung disease. When the lungs do not work properly, the heart must work harder. What increases the risk? Risk of heart failure increases as a person ages. This condition is also more likely to develop in people who:  Are overweight.  Are male.  Smoke or chew tobacco.  Abuse alcohol or illegal drugs.  Have taken medicines that can damage the heart, such as chemotherapy drugs.  Have  diabetes.  High blood sugar (glucose) is associated with high fat (lipid) levels in the blood.  Diabetes can also damage tiny blood vessels that carry nutrients to the heart muscle.  Have abnormal heart rhythms.  Have thyroid problems.  Have low blood counts (anemia). What are the signs or symptoms? Symptoms of this condition include:  Shortness of breath with activity, such as when climbing stairs.  Persistent cough.  Swelling of the feet, ankles, legs, or abdomen.  Unexplained weight gain.  Difficulty breathing when lying flat (orthopnea).  Waking from sleep because of the need to sit up and get more air.  Rapid heartbeat.  Fatigue and loss of energy.  Feeling light-headed, dizzy, or close to fainting.  Loss of appetite.  Nausea.  Increased urination during the night (nocturia).  Confusion. How is this diagnosed? This condition is diagnosed based on:  Medical history, symptoms, and a physical exam.  Diagnostic tests, which may include:  Echocardiogram.  Electrocardiogram (ECG).  Chest X-ray.  Blood tests.  Exercise stress test.  Radionuclide scans.  Cardiac catheterization and angiogram. How is this treated? Treatment for this condition is aimed at managing the symptoms of heart failure. Medicines, behavioral changes, or other treatments may be necessary to treat heart failure. Medicines  These may include:  Angiotensin-converting enzyme (ACE) inhibitors. This type of medicine blocks the effects of a blood protein called angiotensin-converting enzyme. ACE inhibitors relax (dilate) the blood vessels and help to lower blood pressure.  Angiotensin receptor blockers (ARBs). This type of medicine blocks the actions of a blood protein called  angiotensin. ARBs dilate the blood vessels and help to lower blood pressure.  Water pills (diuretics). Diuretics cause the kidneys to remove salt and water from the blood. The extra fluid is removed through  urination, leaving a lower volume of blood that the heart has to pump.  Beta blockers. These improve heart muscle strength and they prevent the heart from beating too quickly.  Digoxin. This increases the force of the heartbeat. Healthy behavior changes  These may include:  Reaching and maintaining a healthy weight.  Stopping smoking or chewing tobacco.  Eating heart-healthy foods.  Limiting or avoiding alcohol.  Stopping use of street drugs (illegal drugs).  Physical activity. Other treatments  These may include:  Surgery to open blocked coronary arteries or repair damaged heart valves.  Placement of a biventricular pacemaker to improve heart muscle function (cardiac resynchronization therapy). This device paces both the right ventricle and left ventricle.  Placement of a device to treat serious abnormal heart rhythms (implantable cardioverter defibrillator, or ICD).  Placement of a device to improve the pumping ability of the heart (left ventricular assist device, or LVAD).  Heart transplant. This can cure heart failure, and it is considered for certain patients who do not improve with other therapies. Follow these instructions at home: Medicines   Take over-the-counter and prescription medicines only as told by your health care provider. Medicines are important in reducing the workload of your heart, slowing the progression of heart failure, and improving your symptoms.  Do not stop taking your medicine unless your health care provider told you to do that.  Do not skip any dose of medicine.  Refill your prescriptions before you run out of medicine. You need your medicines every day. Eating and drinking    Eat heart-healthy foods. Talk with a dietitian to make an eating plan that is right for you.  Choose foods that contain no trans fat and are low in saturated fat and cholesterol. Healthy choices include fresh or frozen fruits and vegetables, fish, lean meats,  legumes, fat-free or low-fat dairy products, and whole-grain or high-fiber foods.  Limit salt (sodium) if directed by your health care provider. Sodium restriction may reduce symptoms of heart failure. Ask a dietitian to recommend heart-healthy seasonings.  Use healthy cooking methods instead of frying. Healthy methods include roasting, grilling, broiling, baking, poaching, steaming, and stir-frying.  Limit your fluid intake if directed by your health care provider. Fluid restriction may reduce symptoms of heart failure. Lifestyle   Stop smoking or using chewing tobacco. Nicotine and tobacco can damage your heart and your blood vessels. Do not use nicotine gum or patches before talking to your health care provider.  Limit alcohol intake to no more than 1 drink per day for non-pregnant women and 2 drinks per day for men. One drink equals 12 oz of beer, 5 oz of wine, or 1 oz of hard liquor.  Drinking more than that is harmful to your heart. Tell your health care provider if you drink alcohol several times a week.  Talk with your health care provider about whether any level of alcohol use is safe for you.  If your heart has already been damaged by alcohol or you have severe heart failure, drinking alcohol should be stopped completely.  Stop use of illegal drugs.  Lose weight if directed by your health care provider. Weight loss may reduce symptoms of heart failure.  Do moderate physical activity if directed by your health care provider. People who are elderly and  people with severe heart failure should consult with a health care provider for physical activity recommendations. Monitor important information   Weigh yourself every day. Keeping track of your weight daily helps you to notice excess fluid sooner.  Weigh yourself every morning after you urinate and before you eat breakfast.  Wear the same amount of clothing each time you weigh yourself.  Record your daily weight. Provide your  health care provider with your weight record.  Monitor and record your blood pressure as told by your health care provider.  Check your pulse as told by your health care provider. Dealing with extreme temperatures   If the weather is extremely hot:  Avoid vigorous physical activity.  Use air conditioning or fans or seek a cooler location.  Avoid caffeine and alcohol.  Wear loose-fitting, lightweight, and light-colored clothing.  If the weather is extremely cold:  Avoid vigorous physical activity.  Layer your clothes.  Wear mittens or gloves, a hat, and a scarf when you go outside.  Avoid alcohol. General instructions   Manage other health conditions such as hypertension, diabetes, thyroid disease, or abnormal heart rhythms as told by your health care provider.  Learn to manage stress. If you need help to do this, ask your health care provider.  Plan rest periods when fatigued.  Get ongoing education and support as needed.  Participate in or seek rehabilitation as needed to maintain or improve independence and quality of life.  Stay up to date with immunizations. Keeping current on pneumococcal and influenza immunizations is especially important to prevent respiratory infections.  Keep all follow-up visits as told by your health care provider. This is important. Contact a health care provider if:  You have a rapid weight gain.  You have increasing shortness of breath that is unusual for you.  You are unable to participate in your usual physical activities.  You tire easily.  You cough more than normal, especially with physical activity.  You have any swelling or more swelling in areas such as your hands, feet, ankles, or abdomen.  You are unable to sleep because it is hard to breathe.  You feel like your heart is beating quickly (palpitations).  You become dizzy or light-headed when you stand up. Get help right away if:  You have difficulty  breathing.  You notice or your family notices a change in your awareness, such as having trouble staying awake or having difficulty with concentration.  You have pain or discomfort in your chest.  You have an episode of fainting (syncope). This information is not intended to replace advice given to you by your health care provider. Make sure you discuss any questions you have with your health care provider. Document Released: 08/29/2005 Document Revised: 05/03/2016 Document Reviewed: 03/23/2016 Elsevier Interactive Patient Education  2017 [employer].

## 2016-11-25 ENCOUNTER — Ambulatory Visit (INDEPENDENT_AMBULATORY_CARE_PROVIDER_SITE_OTHER): Payer: Self-pay | Admitting: Family

## 2016-11-25 ENCOUNTER — Encounter (INDEPENDENT_AMBULATORY_CARE_PROVIDER_SITE_OTHER): Payer: Self-pay | Admitting: Family

## 2016-11-25 VITALS — Ht 73.0 in | Wt 314.0 lb

## 2016-11-25 DIAGNOSIS — E1165 Type 2 diabetes mellitus with hyperglycemia: Secondary | ICD-10-CM

## 2016-11-25 DIAGNOSIS — IMO0002 Reserved for concepts with insufficient information to code with codable children: Secondary | ICD-10-CM

## 2016-11-25 DIAGNOSIS — Z89432 Acquired absence of left foot: Secondary | ICD-10-CM

## 2016-11-25 LAB — BRAIN NATRIURETIC PEPTIDE: Brain Natriuretic Peptide: 301 pg/mL — ABNORMAL HIGH (ref <100)

## 2016-11-25 NOTE — Progress Notes (Signed)
Office Visit Note   Patient: Paul Mclaughlin           Date of Birth: [birth year]           MRN: [id number] Visit Date: 11/25/2016              Requested by: Jaclyn Shaggy, MD [address] [place], [place] [address] PCP: Jaclyn Shaggy, MD  Chief Complaint  Patient presents with  . Left Foot - Follow-up    Left foot  06/24/16 left foot 5th ray amputation      HPI: Patient is full weight bearing with a post op shoe. He states that he applies silvadene dressing change to the foot daily and also applied a nitro patch. There is a small amount of yellow drainage and there is slight maceration around the area. It does appear slightly smaller and the pt states that he thinks " its as filled in as its going to get" Autumn L Forrest, RMA  The patient is a 61 year old gentleman who presents today in follow-up for left foot fifth ray amputation. This was done October of last year. This is been slow to heal. The patient has been doing Silvadene dressing changes is in a postop shoe. 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  Assessment & Plan: Visit Diagnoses:  1. Foot amputation status, left (HCC)   2. Controlled type 2 diabetes mellitus with hyperglycemia, without long-term current use of insulin (HCC)     Plan: Continue daily wound care. Silvadene dressings. Follow-up in 4 more weeks. Hope To release him at that time.  Follow-Up Instructions: Return in about 4 weeks (around 12/23/2016).   Ortho Exam Physical Exam  Constitutional: Appears well-developed.  Head: Normocephalic.  Eyes: EOM are normal.  Neck: Normal range of motion.  Cardiovascular: Normal rate.   Pulmonary/Chest: Effort normal.  Neurological: Is alert.  Skin: Skin is warm.  Psychiatric: Has a normal mood and affect. Left Foot: Status post fifth ray amputation. There is scattered ulceration along the incision. These measure about 15 mm x 4 mm and the next ulceration is 1 cm x 3 mm as well. The wound  was debrided of exudative tissue there is beefy bleeding granulation tissue in the wound bed. There is no surrounding erythema and no drainage no odor no sign of infection. Imaging: No results found.  Labs: Lab Results  Component Value Date   HGBA1C 5.9 10/04/2016   HGBA1C 9.2 06/30/2016   REPTSTATUS 06/27/2016 FINAL 06/22/2016   CULT NO GROWTH 5 DAYS 06/22/2016    Orders:  No orders of the defined types were placed in this encounter.  No orders of the defined types were placed in this encounter.    Procedures: No procedures performed  Clinical Data: No additional findings.  Subjective: Review of Systems  Constitutional: Negative for chills and fever.  Skin: Positive for wound. Negative for color change.    Objective: Vital Signs: Ht 6\' 1"  (1.854 m)   Wt (!) 314 lb (142.4 kg)   BMI 41.43 kg/m   Specialty Comments:  No specialty comments available.  PMFS History: Patient Active Problem List   Diagnosis Date Noted  . Congestive heart failure (CHF) (HCC) 11/24/2016  . Insomnia 11/24/2016  . Foot amputation status, left (HCC) 08/22/2016  . Atrial fibrillation (HCC) 06/30/2016  . Essential hypertension 06/30/2016  . Cellulitis of left foot 06/22/2016  . Controlled type 2 diabetes mellitus with hyperglycemia (HCC) 06/22/2016   Past Medical History:  Diagnosis Date  . Cellulitis and abscess of left leg 06/2016  . Diabetes (HCC)     Family History  Problem Relation Age of Onset  . Diabetes Mellitus II Mother     ESRD  . Diabetes Mellitus II Brother   . Emphysema Father     Past Surgical History:  Procedure Laterality Date  . AMPUTATION Left 06/24/2016   Procedure: FOOT FIFTH RAY TOE AMPUTATION;  Surgeon: Nadara MustardMarcus V Duda, MD;  Location: [facility] OR;  Service: Orthopedics;  Laterality: Left;  . open heart surgery     As a child.  [age] years old.  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    Social History   Occupational History  . Not on file.   Social History Main Topics  . Smoking  status: Never Smoker  . Smokeless tobacco: Never Used  . Alcohol use No     Comment: gave up drinking ~20 years ago  . Drug use: Yes    Types: Marijuana     Comment: not since the 1st of January  . Sexual activity: Not on file

## 2016-11-28 ENCOUNTER — Ambulatory Visit: Payer: Self-pay | Admitting: Family Medicine

## 2016-11-28 ENCOUNTER — Ambulatory Visit (HOSPITAL_COMMUNITY)
Admission: RE | Admit: 2016-11-28 | Discharge: 2016-11-28 | Disposition: A | Discharge status: Home / Self Care | Payer: Self-pay | Source: Ambulatory Visit | Attending: Family Medicine | Admitting: Family Medicine

## 2016-11-28 DIAGNOSIS — IMO0002 Reserved for concepts with insufficient information to code with codable children: Secondary | ICD-10-CM

## 2016-11-28 DIAGNOSIS — I10 Essential (primary) hypertension: Secondary | ICD-10-CM | POA: Insufficient documentation

## 2016-11-28 DIAGNOSIS — I313 Pericardial effusion (noninflammatory): Secondary | ICD-10-CM | POA: Insufficient documentation

## 2016-11-28 DIAGNOSIS — J90 Pleural effusion, not elsewhere classified: Secondary | ICD-10-CM | POA: Insufficient documentation

## 2016-11-28 DIAGNOSIS — Z89432 Acquired absence of left foot: Secondary | ICD-10-CM | POA: Insufficient documentation

## 2016-11-28 DIAGNOSIS — L97423 Non-pressure chronic ulcer of left heel and midfoot with necrosis of muscle: Secondary | ICD-10-CM | POA: Insufficient documentation

## 2016-11-28 DIAGNOSIS — I517 Cardiomegaly: Secondary | ICD-10-CM | POA: Insufficient documentation

## 2016-11-28 DIAGNOSIS — I361 Nonrheumatic tricuspid (valve) insufficiency: Secondary | ICD-10-CM | POA: Insufficient documentation

## 2016-11-28 DIAGNOSIS — E11621 Type 2 diabetes mellitus with foot ulcer: Secondary | ICD-10-CM | POA: Insufficient documentation

## 2016-11-28 DIAGNOSIS — R0609 Other forms of dyspnea: Secondary | ICD-10-CM | POA: Insufficient documentation

## 2016-11-28 DIAGNOSIS — R635 Abnormal weight gain: Secondary | ICD-10-CM | POA: Insufficient documentation

## 2016-11-28 DIAGNOSIS — I480 Paroxysmal atrial fibrillation: Secondary | ICD-10-CM | POA: Insufficient documentation

## 2016-11-28 DIAGNOSIS — R601 Generalized edema: Secondary | ICD-10-CM | POA: Insufficient documentation

## 2016-11-28 DIAGNOSIS — I348 Other nonrheumatic mitral valve disorders: Secondary | ICD-10-CM | POA: Insufficient documentation

## 2016-11-28 NOTE — Progress Notes (Signed)
  Echocardiogram 2D Echocardiogram has been performed.  Paul Mclaughlin 11/28/2016, 10:57 AM

## 2016-11-29 ENCOUNTER — Ambulatory Visit: Payer: Self-pay | Attending: Family Medicine | Admitting: Family Medicine

## 2016-11-29 ENCOUNTER — Encounter: Payer: Self-pay | Admitting: Family Medicine

## 2016-11-29 ENCOUNTER — Other Ambulatory Visit: Payer: Self-pay | Admitting: Family Medicine

## 2016-11-29 VITALS — BP 143/79 | HR 79 | Temp 97.4°F | Ht 73.0 in | Wt 335.2 lb

## 2016-11-29 DIAGNOSIS — E1165 Type 2 diabetes mellitus with hyperglycemia: Secondary | ICD-10-CM | POA: Insufficient documentation

## 2016-11-29 DIAGNOSIS — I110 Hypertensive heart disease with heart failure: Secondary | ICD-10-CM | POA: Insufficient documentation

## 2016-11-29 DIAGNOSIS — Z794 Long term (current) use of insulin: Principal | ICD-10-CM

## 2016-11-29 DIAGNOSIS — I10 Essential (primary) hypertension: Secondary | ICD-10-CM

## 2016-11-29 DIAGNOSIS — E1149 Type 2 diabetes mellitus with other diabetic neurological complication: Secondary | ICD-10-CM

## 2016-11-29 DIAGNOSIS — Z5189 Encounter for other specified aftercare: Secondary | ICD-10-CM | POA: Insufficient documentation

## 2016-11-29 DIAGNOSIS — Z7901 Long term (current) use of anticoagulants: Secondary | ICD-10-CM | POA: Insufficient documentation

## 2016-11-29 DIAGNOSIS — Z9889 Other specified postprocedural states: Secondary | ICD-10-CM | POA: Insufficient documentation

## 2016-11-29 DIAGNOSIS — Z7984 Long term (current) use of oral hypoglycemic drugs: Secondary | ICD-10-CM | POA: Insufficient documentation

## 2016-11-29 DIAGNOSIS — L97529 Non-pressure chronic ulcer of other part of left foot with unspecified severity: Secondary | ICD-10-CM | POA: Insufficient documentation

## 2016-11-29 DIAGNOSIS — Z89432 Acquired absence of left foot: Secondary | ICD-10-CM

## 2016-11-29 DIAGNOSIS — E11621 Type 2 diabetes mellitus with foot ulcer: Secondary | ICD-10-CM | POA: Insufficient documentation

## 2016-11-29 DIAGNOSIS — I509 Heart failure, unspecified: Secondary | ICD-10-CM | POA: Insufficient documentation

## 2016-11-29 DIAGNOSIS — IMO0002 Reserved for concepts with insufficient information to code with codable children: Secondary | ICD-10-CM

## 2016-11-29 DIAGNOSIS — Z79899 Other long term (current) drug therapy: Secondary | ICD-10-CM | POA: Insufficient documentation

## 2016-11-29 DIAGNOSIS — I480 Paroxysmal atrial fibrillation: Secondary | ICD-10-CM | POA: Insufficient documentation

## 2016-11-29 LAB — COMPLETE METABOLIC PANEL WITH GFR
ALBUMIN: 3.5 g/dL — AB (ref 3.6–5.1)
ALT: 13 U/L (ref 9–46)
AST: 19 U/L (ref 10–35)
Alkaline Phosphatase: 165 U/L — ABNORMAL HIGH (ref 40–115)
BUN: 26 mg/dL — AB (ref 7–25)
CALCIUM: 9 mg/dL (ref 8.6–10.3)
CHLORIDE: 101 mmol/L (ref 98–110)
CO2: 30 mmol/L (ref 20–31)
Creat: 1.23 mg/dL (ref 0.70–1.33)
GFR, EST AFRICAN AMERICAN: 74 mL/min (ref >=60)
GFR, EST NON AFRICAN AMERICAN: 64 mL/min (ref >=60)
Glucose, Bld: 110 mg/dL — ABNORMAL HIGH (ref 65–99)
Potassium: 4.2 mmol/L (ref 3.5–5.3)
SODIUM: 139 mmol/L (ref 135–146)
TOTAL PROTEIN: 6.6 g/dL (ref 6.1–8.1)
Total Bilirubin: 1.3 mg/dL — ABNORMAL HIGH (ref 0.2–1.2)

## 2016-11-29 LAB — GLUCOSE, POCT (MANUAL RESULT ENTRY): POC Glucose: 123 mg/dl — AB (ref 70–99)

## 2016-11-29 MED FILL — GABAPENTIN 300 MG CAPSULE: 300 | 30 days supply | Qty: 60 | Fill #0

## 2016-11-29 NOTE — Progress Notes (Signed)
Subjective:  Patient ID: Paul Mclaughlin, male    DOB: [birth year]  Age: 61 y.o. 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  He had a  significant weight gain of 51 pounds between 10/04/16 and 11/09/16 - commenced on Lasix 43m on 11/09/16 which was subsequently increased to 40 mg twice daily at his last visit, 4 days ago with the net loss of 7 pounds in the last 3 weeks.  CXR  (11/09/16) - cardiomegaly with small pleural effusions, vascular congestion and mild pulmonary edema  2-D echo (11/28/16) - moderate LVH, normal systolic function, no regional wall motion abnormalities, LVEF of 55-60%. Paradoxical ventricular septal motion consistent with RV pressure overload, moderately to severely dilated left atrium, severely dilated right ventricle, moderately to severely reduced right ventricular systolic function. Severely dilated right atrium, mild-to-moderate tricuspid regurg, moderately to severely increased PA pressures, trivial pericardial effusion, right pleural effusion.  He complains of shortness of breath on mild exertion which has improved compared to last visit, denies chest pains; still has persistent pedal edema and ascites. He endorses compliance with his medications.  Past Medical History:  Diagnosis Date  . Cellulitis and abscess of left leg 06/2016  . [place]     No Known Allergies   Outpatient Medications Prior to Visit  Medication Sig Dispense Refill  . atorvastatin (LIPITOR) 20 MG tablet TAKE 1 TABLET BY MOUTH DAILY. 30 tablet 3  . blood glucose  meter kit and supplies KIT Dispense based on patient and insurance preference. Use up to four times daily as directed. (FOR ICD-9 250.00, 250.01). 1 each 0  . Blood Glucose Monitoring Suppl (TRUE METRIX METER) w/Device KIT Use as directed 1 kit 0  . furosemide (LASIX) 40 MG tablet Take 1 tablet (40 mg total) by mouth 2 (two) times daily. 60 tablet 3  . gabapentin (NEURONTIN) 300 MG capsule Take 1 capsule (300 mg total) by mouth 2 (two) times daily. 60 capsule 3  . glipiZIDE (GLUCOTROL) 5 MG tablet Take 1 tablet (5 mg total) by mouth 2 (two) times daily before a meal. 60 tablet 3  . glucose blood (TRUE METRIX BLOOD GLUCOSE TEST) test strip Use as instructed 100 each 12  . hydrALAZINE (APRESOLINE) 25 MG tablet Take 1 tablet (25 mg total) by mouth 3 (three) times daily. 90 tablet 3  . lisinopril (PRINIVIL,ZESTRIL) 40 MG tablet Take 1 tablet (40 mg total) by mouth daily. 30 tablet 3  . metFORMIN (GLUCOPHAGE) 500 MG tablet Take 1 tablet (500 mg total) by mouth 2 (two) times daily with a meal. 60 tablet 3  . metoprolol (LOPRESSOR) 50 MG tablet TAKE 1 TABLET BY MOUTH 2 TIMES DAILY. 60 tablet 3  . potassium chloride SA (K-DUR,KLOR-CON) 20 MEQ tablet Take 1 tablet (20 mEq total) by mouth daily. 30 tablet 3  . rivaroxaban (XARELTO) 20 MG TABS tablet Take 1 tablet (20 mg total) by mouth daily with supper. 90 tablet 3  . silver sulfADIAZINE (SILVADENE) 1 % cream Apply 1 application topically daily. 400 g 3  . TRUEPLUS INSULIN SYRINGE 30G X 5/16" 0.3 ML  MISC USE AS DIRECTED 100 each 5  . TRUEPLUS LANCETS 28G MISC Use as directed 100 each 5  . albuterol (PROVENTIL HFA;VENTOLIN HFA) 108 (90 Base) MCG/ACT inhaler Inhale 2 puffs into the lungs every 6 (six) hours as needed for wheezing or shortness of breath. (Patient not taking: Reported on 11/29/2016) 1 Inhaler 0  . ciclopirox (PENLAC) 8 % solution Apply topically at bedtime. Apply over nail and surrounding skin. After seven (7) days, may remove with alcohol and  continue cycle. (Patient not taking: Reported on 11/29/2016) 6.6 mL 1  . nitroGLYCERIN (NITRODUR - DOSED IN MG/24 HR) 0.2 mg/hr patch Place 1 patch (0.2 mg total) onto the skin daily. Change the patch location daily applied to top of foot. (Patient not taking: Reported on 11/29/2016) 30 patch 3  . traZODone (DESYREL) 100 MG tablet Take 1 tablet (100 mg total) by mouth at bedtime as needed for sleep. (Patient not taking: Reported on 11/29/2016) 30 tablet 3   No facility-administered medications prior to visit.     ROS Review of Systems Constitutional: Negative for activity change and appetite change.  positive for weight gain HENT: Negative for sinus pressure and sore throat.   Eyes: Negative for visual disturbance.  Respiratory: Positive for shortness of breath. Negative for cough and chest tightness.   Cardiovascular: Positive for leg swelling. Negative for chest pain.  Gastrointestinal: Negative for abdominal distention, abdominal pain, constipation and diarrhea.  Endocrine: Negative.   Genitourinary: Negative for dysuria.  Musculoskeletal: Negative for joint swelling and myalgias.  Skin: Positive for wound. Negative for rash.  Allergic/Immunologic: Negative.   Neurological: Negative for weakness, light-headedness and numbness.  Psychiatric/Behavioral: Negative for dysphoric mood and suicidal ideas.   Objective:  BP (!) 143/79 (BP Location: Right Arm, Patient Position: Sitting, Cuff Size: Large)   Pulse 79   Temp 97.4 F (36.3 C) (Oral)   Ht 6' 1"  (1.854 m)   Wt (!) 335 lb 3.2 oz (152 kg)   SpO2 96%   BMI 44.22 kg/m   BP/Weight 11/29/2016 11/25/2016 1/55/2080  Systolic BP 223 - 361  Diastolic BP 79 - 74  Wt. (Lbs) 335.2 314 341.2  BMI 44.22 41.43 45.02    Wt Readings from Last 3 Encounters:  11/29/16 (!) 335 lb 3.2 oz (152 kg)  11/25/16 (!) 314 lb (142.4 kg)  11/24/16 (!) 341 lb 3.2 oz (154.8 kg)     Physical Exam Constitutional: He is oriented to person, place, and  time. He appears well-developed and well-nourished.  Obese  Neck: No JVD Cardiovascular: Irregularly irregular rhythm, normal rate normal heart sounds and intact distal pulses.   No murmur heard. Pulmonary/Chest: Effort normal and breath sounds normal. He has no wheezes. He has no rales. He exhibits no tenderness.  Abdominal: Soft. Bowel sounds are normal. He exhibits no distension and no mass. There is tenderness (with evidence of ascites).  Musculoskeletal: Normal range of motion. He exhibits edema and tenderness (3+ pitting pedal edema up to thighs bilaterally).  Left great toe eschar on the plantar aspect with no discharge. Lateral aspect of the left foot with linear wound overlying the region of the left fifth toe ray amputation with fibrinous discharge which is yellowish, no foul odor.  Neurological: He is alert and oriented to person, place, and time.    Lab Results  Component Value Date   HGBA1C 5.9 10/04/2016    CMP Latest Ref Rng & Units 11/24/2016 11/09/2016 11/09/2016  Glucose 65 - 99 mg/dL 72 126(H) 122(H)  BUN 7 - 25 mg/dL 23 17 18   Creatinine 0.70 - 1.33 mg/dL 1.02 1.10 1.13  Sodium 135 - 146 mmol/L 140 136 138  Potassium 3.5 - 5.3 mmol/L 4.2 4.3 4.7  Chloride 98 - 110 mmol/L 103 104 103  CO2 20 - 31 mmol/L 28 24 23   Calcium 8.6 - 10.3 mg/dL 9.2 9.1 9.1  Total Protein 6.1 - 8.1 g/dL 6.8 - 6.9  Total Bilirubin 0.2 - 1.2 mg/dL 1.3(H) - 1.2  Alkaline Phos 40 - 115 U/L 160(H) - 143(H)  AST 10 - 35 U/L 20 - 22  ALT 9 - 46 U/L 12 - 15     Assessment & Plan:   1. Controlled type 2 diabetes mellitus with hyperglycemia, without long-term current use of insulin (HCC) Controlled with A1c of 5.9 - Glucose (CBG) - Microalbumin / creatinine urine ratio  2. Essential hypertension Slightly elevated May benefit from addition of Imdur but will defer to cardiology Low-sodium, DASH diet - COMPLETE METABOLIC PANEL WITH GFR  3. Congestive heart failure, unspecified congestive  heart failure chronicity, unspecified congestive heart failure type (HCC) EF of 55-60%, increased PA pressures, findings consistent with ventricular pressure overload from 2-D echo 11/28/2016 Will need left heart cath and right heart cath Evidence of fluid overload Continue Lasix 40 mg twice daily, potassium Currently on beta blocker, ACE inhibitor, hydralazine Limit fluid intake to 2 L per day, daily   weight checks, low-sodium diet- Ambulatory referral to Cardiology  4. Paroxysmal atrial fibrillation (HCC) Anticoagulation with Xarelto, rate control with metoprolol - Ambulatory referral to Cardiology  5. Foot ulcer As per orthopedic-Paul. Sharol Mclaughlin  No orders of the defined types were placed in this encounter.   Follow-up: Return in about 3 weeks (around 12/20/2016) for Follow-up on congestive heart failure.   Paul Morale MD

## 2016-11-29 NOTE — Patient Instructions (Signed)
Heart Failure °Heart failure is a condition in which the heart has trouble pumping blood because it has become weak or stiff. This means that the heart does not pump blood efficiently for the body to work well. For some people with heart failure, fluid may back up into the lungs and there may be swelling (edema) in the lower legs. Heart failure is usually a long-term (chronic) condition. It is important for you to take good care of yourself and follow the treatment plan from your health care provider. °What are the causes? °This condition is caused by some health problems, including: °· High blood pressure (hypertension). Hypertension causes the heart muscle to work harder than normal. High blood pressure eventually causes the heart to become stiff and weak. °· Coronary artery disease (CAD). CAD is the buildup of cholesterol and fat (plaques) in the arteries of the heart. °· Heart attack (myocardial infarction). Injured tissue, which is caused by the heart attack, does not contract as well and the heart's ability to pump blood is weakened. °· Abnormal heart valves. When the heart valves do not open and close properly, the heart muscle must pump harder to keep the blood flowing. °· Heart muscle disease (cardiomyopathy or myocarditis). Heart muscle disease is damage to the heart muscle from a variety of causes, such as drug or alcohol abuse, infections, or unknown causes. These can increase the risk of heart failure. °· Lung disease. When the lungs do not work properly, the heart must work harder. ° °What increases the risk? °Risk of heart failure increases as a person ages. This condition is also more likely to develop in people who: °· Are overweight. °· Are male. °· Smoke or chew tobacco. °· Abuse alcohol or illegal drugs. °· Have taken medicines that can damage the heart, such as chemotherapy drugs. °· Have diabetes. °? High blood sugar (glucose) is associated with high fat (lipid) levels in the blood. °? Diabetes  can also damage tiny blood vessels that carry nutrients to the heart muscle. °· Have abnormal heart rhythms. °· Have thyroid problems. °· Have low blood counts (anemia). ° °What are the signs or symptoms? °Symptoms of this condition include: °· Shortness of breath with activity, such as when climbing stairs. °· Persistent cough. °· Swelling of the feet, ankles, legs, or abdomen. °· Unexplained weight gain. °· Difficulty breathing when lying flat (orthopnea). °· Waking from sleep because of the need to sit up and get more air. °· Rapid heartbeat. °· Fatigue and loss of energy. °· Feeling light-headed, dizzy, or close to fainting. °· Loss of appetite. °· Nausea. °· Increased urination during the night (nocturia). °· Confusion. ° °How is this diagnosed? °This condition is diagnosed based on: °· Medical history, symptoms, and a physical exam. °· Diagnostic tests, which may include: °? Echocardiogram. °? Electrocardiogram (ECG). °? Chest X-ray. °? Blood tests. °? Exercise stress test. °? Radionuclide scans. °? Cardiac catheterization and angiogram. ° °How is this treated? °Treatment for this condition is aimed at managing the symptoms of heart failure. Medicines, behavioral changes, or other treatments may be necessary to treat heart failure. °Medicines °These may include: °· Angiotensin-converting enzyme (ACE) inhibitors. This type of medicine blocks the effects of a blood protein called angiotensin-converting enzyme. ACE inhibitors relax (dilate) the blood vessels and help to lower blood pressure. °· Angiotensin receptor blockers (ARBs). This type of medicine blocks the actions of a blood protein called angiotensin. ARBs dilate the blood vessels and help to lower blood pressure. °· Water   pills (diuretics). Diuretics cause the kidneys to remove salt and water from the blood. The extra fluid is removed through urination, leaving a lower volume of blood that the heart has to pump. °· Beta blockers. These improve heart  muscle strength and they prevent the heart from beating too quickly. °· Digoxin. This increases the force of the heartbeat. ° °Healthy behavior changes °These may include: °· Reaching and maintaining a healthy weight. °· Stopping smoking or chewing tobacco. °· Eating heart-healthy foods. °· Limiting or avoiding alcohol. °· Stopping use of street drugs (illegal drugs). °· Physical activity. ° °Other treatments °These may include: °· Surgery to open blocked coronary arteries or repair damaged heart valves. °· Placement of a biventricular pacemaker to improve heart muscle function (cardiac resynchronization therapy). This device paces both the right ventricle and left ventricle. °· Placement of a device to treat serious abnormal heart rhythms (implantable cardioverter defibrillator, or ICD). °· Placement of a device to improve the pumping ability of the heart (left ventricular assist device, or LVAD). °· Heart transplant. This can cure heart failure, and it is considered for certain patients who do not improve with other therapies. ° °Follow these instructions at home: °Medicines °· Take over-the-counter and prescription medicines only as told by your health care provider. Medicines are important in reducing the workload of your heart, slowing the progression of heart failure, and improving your symptoms. °? Do not stop taking your medicine unless your health care provider told you to do that. °? Do not skip any dose of medicine. °? Refill your prescriptions before you run out of medicine. You need your medicines every day. °Eating and drinking ° °· Eat heart-healthy foods. Talk with a dietitian to make an eating plan that is right for you. °? Choose foods that contain no trans fat and are low in saturated fat and cholesterol. Healthy choices include fresh or frozen fruits and vegetables, fish, lean meats, legumes, fat-free or low-fat dairy products, and whole-grain or high-fiber foods. °? Limit salt (sodium) if  directed by your health care provider. Sodium restriction may reduce symptoms of heart failure. Ask a dietitian to recommend heart-healthy seasonings. °? Use healthy cooking methods instead of frying. Healthy methods include roasting, grilling, broiling, baking, poaching, steaming, and stir-frying. °· Limit your fluid intake if directed by your health care provider. Fluid restriction may reduce symptoms of heart failure. °Lifestyle °· Stop smoking or using chewing tobacco. Nicotine and tobacco can damage your heart and your blood vessels. Do not use nicotine gum or patches before talking to your health care provider. °· Limit alcohol intake to no more than 1 drink per day for non-pregnant women and 2 drinks per day for men. One drink equals 12 oz of beer, 5 oz of wine, or 1½ oz of hard liquor. °? Drinking more than that is harmful to your heart. Tell your health care provider if you drink alcohol several times a week. °? Talk with your health care provider about whether any level of alcohol use is safe for you. °? If your heart has already been damaged by alcohol or you have severe heart failure, drinking alcohol should be stopped completely. °· Stop use of illegal drugs. °· Lose weight if directed by your health care provider. Weight loss may reduce symptoms of heart failure. °· Do moderate physical activity if directed by your health care provider. People who are elderly and people with severe heart failure should consult with a health care provider for physical activity recommendations. °  Monitor important information °· Weigh yourself every day. Keeping track of your weight daily helps you to notice excess fluid sooner. °? Weigh yourself every morning after you urinate and before you eat breakfast. °? Wear the same amount of clothing each time you weigh yourself. °? Record your daily weight. Provide your health care provider with your weight record. °· Monitor and record your blood pressure as told by your health  care provider. °· Check your pulse as told by your health care provider. °Dealing with extreme temperatures °· If the weather is extremely hot: °? Avoid vigorous physical activity. °? Use air conditioning or fans or seek a cooler location. °? Avoid caffeine and alcohol. °? Wear loose-fitting, lightweight, and light-colored clothing. °· If the weather is extremely cold: °? Avoid vigorous physical activity. °? Layer your clothes. °? Wear mittens or gloves, a hat, and a scarf when you go outside. °? Avoid alcohol. °General instructions °· Manage other health conditions such as hypertension, diabetes, thyroid disease, or abnormal heart rhythms as told by your health care provider. °· Learn to manage stress. If you need help to do this, ask your health care provider. °· Plan rest periods when fatigued. °· Get ongoing education and support as needed. °· Participate in or seek rehabilitation as needed to maintain or improve independence and quality of life. °· Stay up to date with immunizations. Keeping current on pneumococcal and influenza immunizations is especially important to prevent respiratory infections. °· Keep all follow-up visits as told by your health care provider. This is important. °Contact a health care provider if: °· You have a rapid weight gain. °· You have increasing shortness of breath that is unusual for you. °· You are unable to participate in your usual physical activities. °· You tire easily. °· You cough more than normal, especially with physical activity. °· You have any swelling or more swelling in areas such as your hands, feet, ankles, or abdomen. °· You are unable to sleep because it is hard to breathe. °· You feel like your heart is beating quickly (palpitations). °· You become dizzy or light-headed when you stand up. °Get help right away if: °· You have difficulty breathing. °· You notice or your family notices a change in your awareness, such as having trouble staying awake or having  difficulty with concentration. °· You have pain or discomfort in your chest. °· You have an episode of fainting (syncope). °This information is not intended to replace advice given to you by your health care provider. Make sure you discuss any questions you have with your health care provider. °Document Released: 08/29/2005 Document Revised: 05/03/2016 Document Reviewed: 03/23/2016 °[employer] Interactive Patient Education © 2017 [employer] ° °

## 2016-11-30 LAB — MICROALBUMIN / CREATININE URINE RATIO

## 2016-12-02 ENCOUNTER — Ambulatory Visit: Payer: Self-pay | Attending: Family Medicine

## 2016-12-07 MED FILL — glipiZIDE 5 MG TABS: 5 | 30 days supply | Qty: 60 | Fill #1

## 2016-12-07 MED FILL — hydrALAZINE HCL 25 MG TABS: 25 | 30 days supply | Qty: 90 | Fill #2

## 2016-12-08 NOTE — Progress Notes (Signed)
Cardiology Office Note   Date:  12/11/2016   ID:  Paul Mclaughlin, DOB 02-May-1956, MRN [id number]  PCP:  Arnoldo Morale, MD  Cardiologist:   Minus Breeding, MD   Chief Complaint  Patient presents with  . Atrial Fibrillation      History of Present Illness: Paul Mclaughlin is a 61 y.o. male who presents for follow up of atrial fib    A recent echo demonstrated NL LV function, moderate to severely increased pulmonary pressure, mild to moderate TR, severe RAE and moderate LAE.    I saw him last year.  He had atrial fib at the time of 5th toe amputation secondary to DM uncontrolled.   Of note he reported having had heart surgery at age 15.  We were not able to confirm but this was possibly a VSD repair.    He was seen by his PCP and had an ED visit secondary to 50 lb weight gain in one month.  This was last week.  I have reviewed these records.    He doesn't feel particularly bad.  He has had increasing leg swelling.  He denies any PND or orthopnea.  He says that he is not eating as much as he was.  He reports not excessive salt.  He does not have a scale at home.  He was recently put on increased Lasix and hydralazine for his BP.  He does not feel palpitations.  He has no presyncope or syncope.  He is not having chest/neck or arm pain.  He is tolerating blood thinner.  He still has his left foot wrapped as the wound is not completely healed.    Of note he has had visual changes with a floating white spot that might appear in his vision for up to 45 minutes at a time.    Past Medical History:  Diagnosis Date  . Cellulitis and abscess of left leg 06/2016  . Diabetes [facility])     Past Surgical History:  Procedure Laterality Date  . AMPUTATION Left 06/24/2016   Procedure: FOOT FIFTH RAY TOE AMPUTATION;  Surgeon: Newt Minion, MD;  Location: [facility];  Service: Orthopedics;  Laterality: Left;  . open heart surgery     As a child.  3 years old.  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      Current Outpatient  Prescriptions  Medication Sig Dispense Refill  . albuterol (PROVENTIL HFA;VENTOLIN HFA) 108 (90 Base) MCG/ACT inhaler Inhale 2 puffs into the lungs every 6 (six) hours as needed for wheezing or shortness of breath. 1 Inhaler 0  . atorvastatin (LIPITOR) 20 MG tablet TAKE 1 TABLET BY MOUTH DAILY. 30 tablet 3  . blood glucose meter kit and supplies KIT Dispense based on patient and insurance preference. Use up to four times daily as directed. (FOR ICD-9 250.00, 250.01). 1 each 0  . Blood Glucose Monitoring Suppl (TRUE METRIX METER) w/Device KIT Use as directed 1 kit 0  . ciclopirox (PENLAC) 8 % solution Apply topically at bedtime. Apply over nail and surrounding skin. After seven (7) days, may remove with alcohol and continue cycle. 6.6 mL 1  . furosemide (LASIX) 40 MG tablet Take 1 tablet (40 mg total) by mouth 2 (two) times daily. 60 tablet 3  . gabapentin (NEURONTIN) 300 MG capsule TAKE 1 CAPSULE BY MOUTH 2 TIMES DAILY. 60 capsule 3  . glipiZIDE (GLUCOTROL) 5 MG tablet Take 1 tablet (5 mg total) by mouth 2 (two) times daily before a meal.  60 tablet 3  . glucose blood (TRUE METRIX BLOOD GLUCOSE TEST) test strip Use as instructed 100 each 12  . hydrALAZINE (APRESOLINE) 50 MG tablet Take 1 tablet (50 mg total) by mouth 3 (three) times daily. 90 tablet 11  . lisinopril (PRINIVIL,ZESTRIL) 40 MG tablet Take 1 tablet (40 mg total) by mouth daily. 30 tablet 3  . metFORMIN (GLUCOPHAGE) 500 MG tablet Take 1 tablet (500 mg total) by mouth 2 (two) times daily with a meal. 60 tablet 3  . metoprolol (LOPRESSOR) 50 MG tablet TAKE 1 TABLET BY MOUTH 2 TIMES DAILY. 60 tablet 3  . nitroGLYCERIN (NITRODUR - DOSED IN MG/24 HR) 0.2 mg/hr patch Place 1 patch (0.2 mg total) onto the skin daily. Change the patch location daily applied to top of foot. 30 patch 3  . potassium chloride SA (K-DUR,KLOR-CON) 20 MEQ tablet Take 1 tablet (20 mEq total) by mouth daily. 30 tablet 3  . rivaroxaban (XARELTO) 20 MG TABS tablet Take 1  tablet (20 mg total) by mouth daily with supper. 90 tablet 3  . silver sulfADIAZINE (SILVADENE) 1 % cream Apply 1 application topically daily. 400 g 3  . traZODone (DESYREL) 100 MG tablet Take 1 tablet (100 mg total) by mouth at bedtime as needed for sleep. 30 tablet 3  . TRUEPLUS INSULIN SYRINGE 30G X 5/16" 0.3 ML MISC USE AS DIRECTED 100 each 5  . TRUEPLUS LANCETS 28G MISC Use as directed 100 each 5   No current facility-administered medications for this visit.     Allergies:   Patient has no known allergies.    ROS:  Please see the history of present illness.   Otherwise, review of systems are positive for none.   All other systems are reviewed and negative.    PHYSICAL EXAM: VS:  BP (!) 148/80   Pulse (!) 54   Ht 6' 1" (1.854 m)   Wt (!) 330 lb (149.7 kg)   BMI 43.54 kg/m  , BMI Body mass index is 43.54 kg/m. GENERAL:  Well appearing though somewhat unkempt HEENT:  Pupils equal round and reactive, fundi not visualized, oral mucosa unremarkable NECK:  Positive jugular venous distention (difficult to assess secondary to the beard), waveform within normal limits, carotid upstroke brisk and symmetric, no bruits, no thyromegaly LYMPHATICS:  No cervical, inguinal adenopathy LUNGS:  Clear to auscultation bilaterally BACK:  No CVA tenderness CHEST:  Unremarkable HEART:  PMI not displaced or sustained,S1 and S2 within normal limits, no S3, no clicks, no rubs,  Murmurs, irregular ABD:  Flat, positive bowel sounds normal in frequency in pitch, no bruits, no rebound, no guarding, no midline pulsatile mass, no hepatomegaly, no splenomegaly EXT:  2 plus pulses throughout, severe edema to the mid thigh, no cyanosis no clubbing, status post amputation.  SKIN:  No rashes no nodules NEURO:  Cranial nerves II through XII grossly intact, motor grossly intact throughout PSYCH:  Cognitively intact, oriented to person place and time    EKG:  EKG is ordered today. The ekg ordered today  demonstrates atrial fib, rate 65, low voltage limb leads.  Non specific T wave inversion not changed from previous EKG.     Recent Labs: 06/25/2016: TSH 1.275 11/09/2016: Hemoglobin 13.2; Platelets 194 11/24/2016: Brain Natriuretic Peptide 301.0 11/29/2016: ALT 13; BUN 26; Creat 1.23; Potassium 4.2; Sodium 139    Lipid Panel    Component Value Date/Time   CHOL 131 06/25/2016 0452   TRIG 149 06/25/2016 0452   HDL 15 (  L) 06/25/2016 0452   CHOLHDL 8.7 06/25/2016 0452   VLDL 30 06/25/2016 0452   LDLCALC 86 06/25/2016 0452     Lab Results  Component Value Date   HGBA1C 5.9 10/04/2016    Wt Readings from Last 3 Encounters:  12/09/16 (!) 330 lb (149.7 kg)  11/29/16 (!) 335 lb 3.2 oz (152 kg)  11/25/16 (!) 314 lb (142.4 kg)      Other studies Reviewed: Additional studies/ records that were reviewed today include:  Primary care office records and ED records. Review of the above records demonstrates:  Please see elsewhere in the note.     ASSESSMENT AND PLAN:   Pulmonary HTN:   I suspect that this is secondary to HTN and diastolic dysfunction but he will likely need a right heart cath at some point to evaluate.  For now I am going to concentrate on fluid control and BP control.   Edema:  He is progressing toward a need for hospitalization if we cannot control this.  I talked to him about keeping his feet up and about salt and fluid.  He will take 80 mg bid Lasix for 3 days.  He has a follow up soon with Arnoldo Morale, MD.  We will see him back in about a month.  He needs a Art gallery manager when he sees Arnoldo Morale, MD  DM:   His A1C is controlled and I congratulated him on this and knowing the level.  Persistent Atrial Fib:  Paul Mclaughlin has a CHA2DS2 - VASc score of 2.   He remains on Xarelto  Congenital Heart Disease:   I see no residual of a VSD or other but he might need TEE in the future with probable right heart cath as above.  Visual disturbance:  I sent a message to the  PCP.  The patient did not want me to send him to an ophthalmologist.  He has no insurance and is very concerned about cost.     Current medicines are reviewed at length with the patient today.  The patient does not have concerns regarding medicines.  The following changes have been made:  As above.  Labs/ tests ordered today include:   Orders Placed This Encounter  Procedures  . EKG 12-Lead     Disposition:   FU with me in one month.    Signed, Minus Breeding, MD  12/11/2016 10:14 AM    [place]

## 2016-12-09 ENCOUNTER — Ambulatory Visit (INDEPENDENT_AMBULATORY_CARE_PROVIDER_SITE_OTHER): Payer: Self-pay | Admitting: Cardiology

## 2016-12-09 VITALS — BP 148/80 | HR 54 | Ht 73.0 in | Wt 330.0 lb

## 2016-12-09 DIAGNOSIS — I10 Essential (primary) hypertension: Secondary | ICD-10-CM

## 2016-12-09 DIAGNOSIS — I482 Chronic atrial fibrillation: Secondary | ICD-10-CM

## 2016-12-09 DIAGNOSIS — I272 Pulmonary hypertension, unspecified: Secondary | ICD-10-CM

## 2016-12-09 DIAGNOSIS — Z794 Long term (current) use of insulin: Secondary | ICD-10-CM

## 2016-12-09 DIAGNOSIS — I4821 Permanent atrial fibrillation: Secondary | ICD-10-CM

## 2016-12-09 DIAGNOSIS — E118 Type 2 diabetes mellitus with unspecified complications: Secondary | ICD-10-CM

## 2016-12-09 MED ORDER — HYDRALAZINE HCL 50 MG PO TABS: 50.0000 mg | ORAL_TABLET | Freq: Three times a day (TID) | ORAL | 11 refills | Status: DC

## 2016-12-09 NOTE — Patient Instructions (Signed)
Medication Instructions:  INCREASE- Lasix 80 mg twice a day for 3 days INCREASE- Hydralazine 50 mg three times a day  Labwork: None Ordered  Testing/Procedures: None Ordered  Follow-Up: Your physician recommends that you schedule a follow-up appointment in: 1 Month with APP   Any Other Special Instructions Will Be Listed Below (If Applicable).   If you need a refill on your cardiac medications before your next appointment, please call your pharmacy.

## 2016-12-11 ENCOUNTER — Encounter: Payer: Self-pay | Admitting: Cardiology

## 2016-12-11 DIAGNOSIS — I272 Pulmonary hypertension, unspecified: Secondary | ICD-10-CM | POA: Insufficient documentation

## 2016-12-11 DIAGNOSIS — E118 Type 2 diabetes mellitus with unspecified complications: Secondary | ICD-10-CM | POA: Insufficient documentation

## 2016-12-11 DIAGNOSIS — N1831 Chronic kidney disease, stage 3a: Secondary | ICD-10-CM | POA: Insufficient documentation

## 2016-12-11 DIAGNOSIS — Z794 Long term (current) use of insulin: Secondary | ICD-10-CM

## 2016-12-12 MED FILL — hydrALAZINE HCL 50 MG TABS: 50 | 30 days supply | Qty: 90 | Fill #0

## 2016-12-13 ENCOUNTER — Telehealth: Payer: Self-pay

## 2016-12-13 DIAGNOSIS — H539 Unspecified visual disturbance: Secondary | ICD-10-CM

## 2016-12-13 DIAGNOSIS — E1165 Type 2 diabetes mellitus with hyperglycemia: Secondary | ICD-10-CM

## 2016-12-13 NOTE — Telephone Encounter (Signed)
-----   Message from Jaclyn Shaggy, MD sent at 11/25/2016  4:52 PM EDT ----- Labs are stable, unchanged from previous

## 2016-12-13 NOTE — Telephone Encounter (Signed)
Writer called patient with lab results.  Patient stated understanding.  He also wanted Dr. Venetia Night to know that he had two incidences where for no reason he was seeing white spots, "like someone took his picture with light bulbs flashing."  He saw the cardiologist after it happened twice and the MD mentioned that he may need a referral to an eye doctor. Patient states that the cardiologist had him double up on his water pill and BP medication for three days, he also had to stay in bed.  Patient has not experienced the spots since before being placed on the medication.

## 2016-12-14 MED FILL — LISINOPRIL 40 MG TABLET: 40 | 30 days supply | Qty: 30 | Fill #1

## 2016-12-16 NOTE — Addendum Note (Signed)
Addended by: Dessa Phi on: 12/16/2016 01:38 PM   Modules accepted: Orders

## 2016-12-16 NOTE — Telephone Encounter (Signed)
Please inform patient that referral to ophthalmology placed to assess eyes specially the retina given the flashes he experienced

## 2016-12-16 NOTE — Telephone Encounter (Signed)
Writer LVM for patient that Dr. Armen Pickup placed a referral to ophthamology for patient's symptoms of "flashing white spots"   Patient encouraged to call back with any questions.

## 2016-12-20 ENCOUNTER — Other Ambulatory Visit: Payer: Self-pay | Admitting: Family Medicine

## 2016-12-20 DIAGNOSIS — Z794 Long term (current) use of insulin: Principal | ICD-10-CM

## 2016-12-20 DIAGNOSIS — E1149 Type 2 diabetes mellitus with other diabetic neurological complication: Secondary | ICD-10-CM

## 2016-12-20 MED FILL — ATORVASTATIN 20 MG TABLET: 20 | 30 days supply | Qty: 30 | Fill #1

## 2016-12-20 MED FILL — FUROSEMIDE 40 MG TABLET: 40 | 30 days supply | Qty: 60 | Fill #1

## 2016-12-20 MED FILL — POTASSIUM CL ER 20 MEQ TAB: 20 | 30 days supply | Qty: 30 | Fill #1

## 2016-12-20 MED FILL — metFORMIN HCL 500 MG TABS: 500 | 30 days supply | Qty: 60 | Fill #0

## 2016-12-20 MED FILL — METOPROLOL TARTRATE 50 MG T: 50 | 30 days supply | Qty: 60 | Fill #2

## 2016-12-20 MED FILL — XARELTO 20 MG TABLET: 20 | 30 days supply | Qty: 30 | Fill #5

## 2016-12-23 ENCOUNTER — Encounter (INDEPENDENT_AMBULATORY_CARE_PROVIDER_SITE_OTHER): Payer: Self-pay | Admitting: Family

## 2016-12-23 ENCOUNTER — Ambulatory Visit (INDEPENDENT_AMBULATORY_CARE_PROVIDER_SITE_OTHER): Payer: Self-pay | Admitting: Family

## 2016-12-23 VITALS — Ht 73.0 in | Wt 330.0 lb

## 2016-12-23 DIAGNOSIS — Z89432 Acquired absence of left foot: Secondary | ICD-10-CM

## 2016-12-23 DIAGNOSIS — IMO0002 Reserved for concepts with insufficient information to code with codable children: Secondary | ICD-10-CM

## 2016-12-24 NOTE — Progress Notes (Signed)
Office Visit Note   Patient: Paul Mclaughlin           Date of Birth: 09-06-1956           MRN: [id number] Visit Date: 12/23/2016              Requested by: Jaclyn Shaggy, MD [address] [place], [place] [address] PCP: Jaclyn Shaggy, MD  Chief Complaint  Patient presents with  . Left Foot - Follow-up    06/24/16 Left foot 5th ray amputation      HPI: The patient is a 61 year old gentleman who presents today in follow-up for fifth ray amputation left foot. This is been slow to heal. He's been doing daily dry dressing changes. States presents using an Ace wrap and gauze. 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 There is excellent improvement in the wound.  Assessment & Plan: Visit Diagnoses:  1. Foot amputation status, left (HCC)     Plan: Continue daily wound cleansing. Continue with antibacterial ointment dressing changes daily. May use Band-Aid. He'll follow-up probably one last time in about 6 weeks.  Follow-Up Instructions: Return in about 6 weeks (around 02/03/2017).   Ortho Exam  Patient is alert, oriented, no adenopathy, well-dressed, normal affect, normal respiratory effort. The left foot fifth ray amputation is healing slowly. There remains to the length of 15 mm x 2 mm of wound bed. This is filled in with eschar and exudative tissue this was debrided in the office today there is underlying granulation tissue. There is bleeding. There is no purulence no surrounding erythema no odor no sign of infection.  Imaging: No results found.  Labs: Lab Results  Component Value Date   HGBA1C 5.9 10/04/2016   HGBA1C 9.2 06/30/2016   REPTSTATUS 06/27/2016 FINAL 06/22/2016   CULT NO GROWTH 5 DAYS 06/22/2016    Orders:  No orders of the defined types were placed in this encounter.  No orders of the defined types were placed in this encounter.    Procedures: No procedures performed  Clinical Data: No additional findings.  ROS:  All other systems  negative, except as noted in the HPI. Review of Systems  Constitutional: Negative for chills and fever.  Skin: Positive for wound. Negative for color change.    Objective: Vital Signs: Ht  (1.854 m)   Wt (!) 330 lb (149.7 kg)   BMI 43.54 kg/m   Specialty Comments:  No specialty comments available.  PMFS History: Patient Active Problem List   Diagnosis Date Noted  . Pulmonary HTN (HCC) 12/11/2016  . Type 2 diabetes mellitus with complication, with long-term current use of insulin (HCC) 12/11/2016  . Congestive heart failure (CHF) (HCC) 11/24/2016  . Insomnia 11/24/2016  . Foot amputation status, left (HCC) 08/22/2016  . Atrial fibrillation (HCC) 06/30/2016  . Essential hypertension 06/30/2016  . Cellulitis of left foot 06/22/2016  . Controlled type 2 diabetes mellitus with hyperglycemia (HCC) 06/22/2016   Past Medical History:  Diagnosis Date  . Cellulitis and abscess of left leg 06/2016  . Diabetes (HCC)     Family History  Problem Relation Age of Onset  . Diabetes Mellitus II Mother     ESRD  . Diabetes Mellitus II Brother   . Emphysema Father     Past Surgical History:  Procedure Laterality Date  . AMPUTATION Left 06/24/2016   Procedure: FOOT FIFTH RAY TOE AMPUTATION;  Surgeon: [name], MD;  Location: [facility];  Service: Orthopedics;  Laterality:  Left;  . open heart surgery     As a child.  [age] years old.  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    Social History   Occupational History  . Not on file.   Social History Main Topics  . Smoking status: Never Smoker  . Smokeless tobacco: Never Used  . Alcohol use No     Comment: gave up drinking ~20 years ago  . Drug use: No     Comment: not since the 1st of January  . Sexual activity: Not on file

## 2016-12-26 ENCOUNTER — Ambulatory Visit: Payer: Self-pay | Attending: Family Medicine | Admitting: Family Medicine

## 2016-12-26 ENCOUNTER — Encounter: Payer: Self-pay | Admitting: Family Medicine

## 2016-12-26 VITALS — BP 149/92 | HR 52 | Temp 97.6°F | Ht 73.0 in | Wt 322.0 lb

## 2016-12-26 DIAGNOSIS — Z89432 Acquired absence of left foot: Secondary | ICD-10-CM | POA: Insufficient documentation

## 2016-12-26 DIAGNOSIS — I5042 Chronic combined systolic (congestive) and diastolic (congestive) heart failure: Secondary | ICD-10-CM

## 2016-12-26 DIAGNOSIS — I110 Hypertensive heart disease with heart failure: Secondary | ICD-10-CM | POA: Insufficient documentation

## 2016-12-26 DIAGNOSIS — H539 Unspecified visual disturbance: Secondary | ICD-10-CM

## 2016-12-26 DIAGNOSIS — I10 Essential (primary) hypertension: Secondary | ICD-10-CM

## 2016-12-26 DIAGNOSIS — L97529 Non-pressure chronic ulcer of other part of left foot with unspecified severity: Secondary | ICD-10-CM | POA: Insufficient documentation

## 2016-12-26 DIAGNOSIS — L97509 Non-pressure chronic ulcer of other part of unspecified foot with unspecified severity: Secondary | ICD-10-CM | POA: Insufficient documentation

## 2016-12-26 DIAGNOSIS — I482 Chronic atrial fibrillation: Secondary | ICD-10-CM

## 2016-12-26 DIAGNOSIS — E1165 Type 2 diabetes mellitus with hyperglycemia: Secondary | ICD-10-CM | POA: Insufficient documentation

## 2016-12-26 DIAGNOSIS — I272 Pulmonary hypertension, unspecified: Secondary | ICD-10-CM

## 2016-12-26 DIAGNOSIS — I480 Paroxysmal atrial fibrillation: Secondary | ICD-10-CM | POA: Insufficient documentation

## 2016-12-26 DIAGNOSIS — Z7901 Long term (current) use of anticoagulants: Secondary | ICD-10-CM | POA: Insufficient documentation

## 2016-12-26 DIAGNOSIS — I4821 Permanent atrial fibrillation: Secondary | ICD-10-CM

## 2016-12-26 DIAGNOSIS — I509 Heart failure, unspecified: Secondary | ICD-10-CM | POA: Insufficient documentation

## 2016-12-26 NOTE — Progress Notes (Signed)
Subjective:    Patient ID: Paul Mclaughlin, male    DOB: [birth year], 61 y.o.   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  He was able to see cardiology- Dr Antoine Poche 2 weeks ago and had changes made to his Lasix with resulting 8 pound weight loss since then. He has noticed improvement in his pedal edema but does have some edema in his abdomen. He is not short of breath.   CXR  (11/09/16) - cardiomegaly with small pleural effusions, vascular congestion and mild pulmonary edema  2-D echo (11/28/16) - moderate LVH, normal systolic function, no regional wall motion abnormalities, LVEF of 55-60%. Paradoxical ventricular septal motion consistent with RV pressure overload, moderately to severely dilated left atrium, severely dilated right ventricle, moderately to severely reduced right ventricular systolic function. Severely dilated right atrium, mild-to-moderate tricuspid regurg, moderately to severely increased PA pressures, trivial pericardial effusion, right pleural effusion.  He complains of seeing white spots in his eyes and this clinic had gotten in touch with him providing resources in the community where he could have eye evaluations given he had no medical coverage for referral however the patient is yet to make any appointment.  Past Medical History:  Diagnosis Date  . Cellulitis and abscess of left leg 06/2016  . Diabetes [facility])     Past Surgical History:  Procedure Laterality Date  . AMPUTATION Left 06/24/2016   Procedure: FOOT FIFTH RAY TOE AMPUTATION;  Surgeon: [name],  MD;  Location: [facility] OR;  Service: Orthopedics;  Laterality: Left;  . open heart surgery     As a child.  [age] years old.  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     No Known Allergies   Review of Systems Constitutional: Negative for activity change and appetite change.  positive for weight gain HENT: Negative for sinus pressure and sore throat.   Eyes: Negative for visual disturbance.  Respiratory: . Negative for cough and chest tightness or shortness of breath.   Cardiovascular: Positive for leg swelling. Negative for chest pain.  Gastrointestinal: Negative for abdominal distention, abdominal pain, constipation and diarrhea.  Endocrine: Negative.   Genitourinary: Negative for dysuria.  Musculoskeletal: Negative for joint swelling and myalgias.  Skin: Positive for wound. Negative for rash.  Allergic/Immunologic: Negative.   Neurological: Negative for weakness, light-headedness and numbness.  Psychiatric/Behavioral: Negative for dysphoric mood and suicidal ideas.     Objective: Vitals:   12/26/16 1340  BP: (!) 149/92  Pulse: (!) 52  Temp: 97.6 F (36.4 C)  TempSrc: Oral  SpO2: 96%  Weight: (!) 322 lb (146.1 kg)  Height:  (1.854 m)    Wt Readings from Last 3 Encounters:  12/26/16 (!) 322 lb (146.1 kg)  12/23/16 (!) 330 lb (149.7 kg)  12/09/16 (!) 330 lb (149.7 kg)      Physical Exam  Constitutional: He is oriented to person, place, and time. He appears well-developed and well-nourished.  Neck: No JVD present.  Cardiovascular: Normal rate, normal heart sounds and intact distal pulses.   No murmur heard. Pulmonary/Chest: Effort normal and breath sounds normal. He has no wheezes. He has no rales. He  exhibits no tenderness.  Abdominal: Soft. Bowel sounds are normal. He exhibits distension. He exhibits no mass. There is no tenderness.  Musculoskeletal: Normal range of motion. He exhibits edema (1+ bilateral pitting pedal edema).  Neurological: He is alert and oriented to person, place, and time.   Psychiatric: He has a normal mood and affect.     CMP Latest Ref Rng & Units 11/29/2016 11/24/2016 11/09/2016  Glucose 65 - 99 mg/dL 161(W) 72 960(A)  BUN 7 - 25 mg/dL 54(U) 23 17  Creatinine 0.70 - 1.33 mg/dL 9.81 1.91 4.78  Sodium 135 - 146 mmol/L 139 140 136  Potassium 3.5 - 5.3 mmol/L 4.2 4.2 4.3  Chloride 98 - 110 mmol/L 101 103 104  CO2 20 - 31 mmol/L Calcium 8.6 - 10.3 mg/dL 9.0 9.2 9.1  Total Protein 6.1 - 8.1 g/dL 6.6 6.8 -  Total Bilirubin 0.2 - 1.2 mg/dL 2.9(F) 1.3(H) -  Alkaline Phos 40 - 115 U/L 165(H) 160(H) -  AST 10 - 35 U/L 19 20 -  ALT 9 - 46 U/L 13 12 -        Assessment & Plan:  1. Controlled type 2 diabetes mellitus with hyperglycemia, without long-term current use of insulin (HCC) Controlled with A1c of 5.9 Discussed hypoglycemia symptoms Will consider reducing dose of glipizide if A1c stays low - Glucose (CBG) - Microalbumin / creatinine urine ratio  2. Essential hypertension Slightly elevated May benefit from addition of Imdur but will defer to cardiology Low-sodium, DASH diet - COMPLETE METABOLIC PANEL WITH GFR  3. Congestive heart failure, unspecified congestive heart failure chronicity, unspecified congestive heart failure type (HCC) EF of 55-60%, increased PA pressures, findings consistent with ventricular pressure overload from 2-D echo 11/28/2016 Some evidence of fluid overload which is improving Lost 8 pounds in the last 2 weeks Will need left heart cath and right heart cath Continue Lasix 40 mg twice daily, potassium Currently on beta blocker, ACE inhibitor, hydralazine Limit fluid intake to 2 L per day, daily   weight checks, low-sodium diet Keep appointment with cardiology  4. Paroxysmal atrial fibrillation (HCC) Anticoagulation with Xarelto, rate control with metoprolol   5. Foot ulcer As per orthopedic-Dr. [name]  6.Visual disturbances Continues to see white spots in visual fields He had previously been provided with  community resources for eye exams but the patient has failed to make an appointment Unfortunately due to lack of medical coverage he cannot be referred to an ophthalmologist but would have to be proactive and sign up for a payment plan with the ophthalmology clinics which he has been made aware of.

## 2016-12-27 LAB — CMP14+EGFR
A/G RATIO: 1.2 (ref 1.2–2.2)
ALBUMIN: 3.9 g/dL (ref 3.5–5.5)
ALT: 16 IU/L (ref 0–44)
AST: 21 IU/L (ref 0–40)
Alkaline Phosphatase: 228 IU/L — ABNORMAL HIGH (ref 39–117)
BILIRUBIN TOTAL: 0.9 mg/dL (ref 0.0–1.2)
BUN / CREAT RATIO: 29 — AB (ref 9–20)
BUN: 32 mg/dL — ABNORMAL HIGH (ref 6–24)
CALCIUM: 9.3 mg/dL (ref 8.7–10.2)
CHLORIDE: 100 mmol/L (ref 96–106)
CO2: 25 mmol/L (ref 18–29)
Creatinine, Ser: 1.12 mg/dL (ref 0.76–1.27)
GFR, EST AFRICAN AMERICAN: 83 mL/min/{1.73_m2} (ref >59)
GFR, EST NON AFRICAN AMERICAN: 72 mL/min/{1.73_m2} (ref >59)
Globulin, Total: 3.3 g/dL (ref 1.5–4.5)
Glucose: 73 mg/dL (ref 65–99)
POTASSIUM: 4.9 mmol/L (ref 3.5–5.2)
SODIUM: 139 mmol/L (ref 134–144)
TOTAL PROTEIN: 7.2 g/dL (ref 6.0–8.5)

## 2016-12-30 ENCOUNTER — Telehealth: Payer: Self-pay | Admitting: *Deleted

## 2016-12-30 NOTE — Telephone Encounter (Signed)
-----   Message from Jaclyn Shaggy, MD sent at 12/27/2016  1:36 PM EDT ----- Kidney function is stable. One of his enzymes is elevated likely from the amputation and chronic foot ulcer. Follow up with his Orthopedic for this.

## 2016-12-30 NOTE — Telephone Encounter (Signed)
Patient verified DOB Patient is aware of kidney function being stable. Patient is aware of one enzyme being elevated likely from the foot ulcer and amputation. Patient advised to FU with the orthopedics. Patient expressed his understanding and had no further questions.

## 2017-01-03 MED FILL — glipiZIDE 5 MG TABS: 5 | 30 days supply | Qty: 60 | Fill #2

## 2017-01-03 MED FILL — GABAPENTIN 300 MG CAPSULE: 300 | 30 days supply | Qty: 60 | Fill #1

## 2017-01-09 ENCOUNTER — Encounter: Payer: Self-pay | Admitting: Cardiology

## 2017-01-09 ENCOUNTER — Ambulatory Visit (INDEPENDENT_AMBULATORY_CARE_PROVIDER_SITE_OTHER): Payer: Self-pay | Admitting: Cardiology

## 2017-01-09 VITALS — BP 144/82 | HR 67 | Ht 73.0 in | Wt 312.0 lb

## 2017-01-09 DIAGNOSIS — I10 Essential (primary) hypertension: Secondary | ICD-10-CM

## 2017-01-09 DIAGNOSIS — I5031 Acute diastolic (congestive) heart failure: Secondary | ICD-10-CM

## 2017-01-09 DIAGNOSIS — I482 Chronic atrial fibrillation, unspecified: Secondary | ICD-10-CM

## 2017-01-09 DIAGNOSIS — I272 Pulmonary hypertension, unspecified: Secondary | ICD-10-CM

## 2017-01-09 DIAGNOSIS — E118 Type 2 diabetes mellitus with unspecified complications: Secondary | ICD-10-CM

## 2017-01-09 DIAGNOSIS — Z794 Long term (current) use of insulin: Secondary | ICD-10-CM

## 2017-01-09 DIAGNOSIS — Z6841 Body Mass Index (BMI) 40.0 and over, adult: Secondary | ICD-10-CM | POA: Insufficient documentation

## 2017-01-09 MED ORDER — FUROSEMIDE 40 MG PO TABS: 40.0000 mg | ORAL_TABLET | Freq: Two times a day (BID) | ORAL | 6 refills | Status: DC

## 2017-01-09 NOTE — Patient Instructions (Signed)
Your physician recommends that you schedule a follow-up appointment in: 3 months with Dr. Hochrein  

## 2017-01-09 NOTE — Progress Notes (Signed)
01/09/2017 [place]   06-Mar-1956  [id number]  Primary Physician Arnoldo Morale, MD Primary Cardiologist: Dr Percival Spanish  HPI:  61 y/o obese Caucasian male with HTN, DM, HLD, CAF on Xarelto and pulmonary HTN. 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 He saw Dr Percival Spanish in march 2018 with LE edema. His diuretics were increased with improvement in his edema. An echo done in March showed his EF to be 55-60% with moderate LVH and severe pulmonary HTN with RVD and PA pressure of 63 mmHg and severely dilated LA. He is in the office today for follow up. I suspect he has sleep apnea. He frequently sleeps poorly and naps during the day. Otherwise he is doing well, no orthopnea and his LE edema is improved.    Current Outpatient Prescriptions  Medication Sig Dispense Refill  . albuterol (PROVENTIL HFA;VENTOLIN HFA) 108 (90 Base) MCG/ACT inhaler Inhale 2 puffs into the lungs every 6 (six) hours as needed for wheezing or shortness of breath. 1 Inhaler 0  . atorvastatin (LIPITOR) 20 MG tablet TAKE 1 TABLET BY MOUTH DAILY. 30 tablet 3  . blood glucose meter kit and supplies KIT Dispense based on patient and insurance preference. Use up to four times daily as directed. (FOR ICD-9 250.00, 250.01). 1 each 0  . Blood Glucose Monitoring Suppl (TRUE METRIX METER) w/Device KIT Use as directed 1 kit 0  . ciclopirox (PENLAC) 8 % solution Apply topically at bedtime. Apply over nail and surrounding skin. After seven (7) days, may remove with alcohol and continue cycle. 6.6 mL 1  . furosemide (LASIX) 40 MG tablet Take 1 tablet (40 mg total) by mouth 2 (two) times daily. 60 tablet 3  . gabapentin (NEURONTIN) 300 MG capsule TAKE 1 CAPSULE BY MOUTH 2 TIMES DAILY. 60 capsule 3  . glipiZIDE (GLUCOTROL) 5 MG tablet Take 1 tablet (5 mg total) by mouth 2 (two) times daily before a meal. 60 tablet 3  . glucose blood (TRUE METRIX BLOOD GLUCOSE TEST) test strip Use as instructed 100 each 12  . hydrALAZINE  (APRESOLINE) 50 MG tablet Take 1 tablet (50 mg total) by mouth 3 (three) times daily. 90 tablet 11  . lisinopril (PRINIVIL,ZESTRIL) 40 MG tablet Take 1 tablet (40 mg total) by mouth daily. 30 tablet 3  . metFORMIN (GLUCOPHAGE) 500 MG tablet TAKE 1 TABLET BY MOUTH 2 TIMES DAILY WITH A MEAL. 60 tablet 3  . metoprolol (LOPRESSOR) 50 MG tablet TAKE 1 TABLET BY MOUTH 2 TIMES DAILY. 60 tablet 3  . nitroGLYCERIN (NITRODUR - DOSED IN MG/24 HR) 0.2 mg/hr patch Place 1 patch (0.2 mg total) onto the skin daily. Change the patch location daily applied to top of foot. 30 patch 3  . potassium chloride SA (K-DUR,KLOR-CON) 20 MEQ tablet Take 1 tablet (20 mEq total) by mouth daily. 30 tablet 3  . rivaroxaban (XARELTO) 20 MG TABS tablet Take 1 tablet (20 mg total) by mouth daily with supper. 90 tablet 3  . silver sulfADIAZINE (SILVADENE) 1 % cream Apply 1 application topically daily. 400 g 3  . traZODone (DESYREL) 100 MG tablet Take 1 tablet (100 mg total) by mouth at bedtime as needed for sleep. 30 tablet 3  . TRUEPLUS INSULIN SYRINGE 30G X 5/16" 0.3 ML MISC USE AS DIRECTED 100 each 5  . TRUEPLUS LANCETS 28G MISC Use as directed 100 each 5   No current facility-administered medications for this visit.     No Known Allergies  Past Medical History:  Diagnosis Date  . Cellulitis and abscess of left leg 06/2016  . Diabetes [facility])     Social History   Social History  . Marital status: Divorced    Spouse name: N/A  . Number of children: 1  . Years of education: N/A   Occupational History  . Not on file.   Social History Main Topics  . Smoking status: Never Smoker  . Smokeless tobacco: Never Used  . Alcohol use No     Comment: gave up drinking ~20 years ago  . Drug use: No     Comment: not since the 1st of January  . Sexual activity: Not on file   Other Topics Concern  . Not on file   Social History Narrative   Lives alone      Family History  Problem Relation Age of Onset  . Diabetes  Mellitus II Mother     ESRD  . Diabetes Mellitus II Brother   . Emphysema Father      Review of Systems: General: negative for chills, fever, night sweats or weight changes.  Cardiovascular: negative for chest pain, dyspnea on exertion, edema, orthopnea, palpitations, paroxysmal nocturnal dyspnea or shortness of breath Dermatological: negative for rash Respiratory: negative for cough or wheezing Urologic: negative for hematuria Abdominal: negative for nausea, vomiting, diarrhea, bright red blood per rectum, melena, or hematemesis Neurologic: negative for visual changes, syncope, or dizziness All other systems reviewed and are otherwise negative except as noted above.    Blood pressure (!) 144/82, pulse 67, height _0  (1.854 m), weight (!) 312 lb (141.5 kg).  General appearance: alert, cooperative, no distress and morbidly obese Neck: no carotid bruit and no JVD Lungs: clear to auscultation bilaterally Heart: regular rate and rhythm Extremities: Trace LE edema with chronic skin changes Skin: Skin color, texture, turgor normal. No rashes or lesions Neurologic: Grossly normal  EKG NSR, incomplete RBBB  ASSESSMENT AND PLAN:   Diastolic CHF (HCC) EF normal LVF, moderate LVH and pulmonary HTN His LE edema is better after his Lasix was increased by Dr Percival Spanish in March  Pulmonary HTN [facility]) RVD and PA pressure of 63 mmHg on echo March 2018  Morbid obesity ([place]) BMI 41- I suspect he has sleep apnea  Essential hypertension Controlled  Type 2 diabetes mellitus with complication, with long-term current use of insulin (HCC) On insulin, followed by PCP  Atrial fibrillation ([place]) Permanent atrial fibrillation with CVR    PLAN  He is improved symptomatically. At some point we may want to consider a sleep study (he is not ready to pursue that now) and a Rt heart cath to see if he may benefit from pulmonary medications. We discuss the importance of a low salt diet. F/U with Dr  Percival Spanish in 3 months.   Kerin Ransom PA-C 01/09/2017 4:08 PM

## 2017-01-09 NOTE — Assessment & Plan Note (Addendum)
EF normal LVF, moderate LVH and pulmonary HTN His LE edema is better after his Lasix was increased by Dr Antoine Poche in March

## 2017-01-09 NOTE — Assessment & Plan Note (Signed)
BMI 41- I suspect he has sleep apnea

## 2017-01-09 NOTE — Assessment & Plan Note (Signed)
On insulin, followed by PCP

## 2017-01-09 NOTE — Assessment & Plan Note (Signed)
RVD and PA pressure of 63 mmHg on echo March 2018

## 2017-01-09 NOTE — Assessment & Plan Note (Signed)
Controlled.  

## 2017-01-09 NOTE — Assessment & Plan Note (Signed)
Permanent atrial fibrillation with CVR

## 2017-01-11 MED FILL — FUROSEMIDE 40 MG TABLET: 40 | 30 days supply | Qty: 60 | Fill #0

## 2017-01-16 MED FILL — LISINOPRIL 40 MG TABLET: 40 | 30 days supply | Qty: 30 | Fill #2

## 2017-01-23 MED FILL — metFORMIN HCL 500 MG TABS: 500 | 30 days supply | Qty: 60 | Fill #1

## 2017-01-23 MED FILL — METOPROLOL TARTRATE 50 MG T: 50 | 30 days supply | Qty: 60 | Fill #3

## 2017-01-23 MED FILL — POTASSIUM CL ER 20 MEQ TAB: 20 | 30 days supply | Qty: 30 | Fill #2

## 2017-01-23 MED FILL — ATORVASTATIN 20 MG TABLET: 20 | 30 days supply | Qty: 30 | Fill #2

## 2017-01-23 MED FILL — XARELTO 20 MG TABLET: 20 | 30 days supply | Qty: 30 | Fill #6

## 2017-01-30 MED FILL — hydrALAZINE HCL 50 MG TABS: 50 | 30 days supply | Qty: 90 | Fill #1

## 2017-02-02 ENCOUNTER — Other Ambulatory Visit: Payer: Self-pay | Admitting: Family Medicine

## 2017-02-02 DIAGNOSIS — R0609 Other forms of dyspnea: Secondary | ICD-10-CM

## 2017-02-02 DIAGNOSIS — L97423 Non-pressure chronic ulcer of left heel and midfoot with necrosis of muscle: Principal | ICD-10-CM

## 2017-02-02 DIAGNOSIS — R601 Generalized edema: Secondary | ICD-10-CM

## 2017-02-02 DIAGNOSIS — IMO0002 Reserved for concepts with insufficient information to code with codable children: Secondary | ICD-10-CM

## 2017-02-02 DIAGNOSIS — I480 Paroxysmal atrial fibrillation: Secondary | ICD-10-CM

## 2017-02-02 DIAGNOSIS — R635 Abnormal weight gain: Secondary | ICD-10-CM

## 2017-02-02 DIAGNOSIS — I10 Essential (primary) hypertension: Secondary | ICD-10-CM

## 2017-02-02 DIAGNOSIS — E11621 Type 2 diabetes mellitus with foot ulcer: Secondary | ICD-10-CM

## 2017-02-02 MED FILL — ?GLIPIZIDE 5MG TABLET: 5 | 30 days supply | Qty: 60 | Fill #3

## 2017-02-02 MED FILL — GABAPENTIN 300 MG CAPSULE: 300 | 30 days supply | Qty: 60 | Fill #2

## 2017-02-02 MED FILL — ?FUROSEMIDE 40 MG TABLET: 40 | 30 days supply | Qty: 60 | Fill #1

## 2017-02-03 ENCOUNTER — Encounter (INDEPENDENT_AMBULATORY_CARE_PROVIDER_SITE_OTHER): Payer: Self-pay | Admitting: Family

## 2017-02-03 ENCOUNTER — Ambulatory Visit (INDEPENDENT_AMBULATORY_CARE_PROVIDER_SITE_OTHER): Payer: Self-pay | Admitting: Family

## 2017-02-03 VITALS — Ht 73.0 in | Wt 312.0 lb

## 2017-02-03 DIAGNOSIS — Z89432 Acquired absence of left foot: Secondary | ICD-10-CM

## 2017-02-03 DIAGNOSIS — E1165 Type 2 diabetes mellitus with hyperglycemia: Secondary | ICD-10-CM

## 2017-02-03 DIAGNOSIS — IMO0002 Reserved for concepts with insufficient information to code with codable children: Secondary | ICD-10-CM

## 2017-02-03 NOTE — Progress Notes (Signed)
Office Visit Note   Patient: Paul Mclaughlin           Date of Birth: 05/12/1956           MRN: [id number] Visit Date: 02/03/2017              Requested by: Jaclyn ShaggyAmao, Enobong, MD [address] [place], [place] [id number] PCP: Jaclyn ShaggyAmao, Enobong, MD  Chief Complaint  Patient presents with  . 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 He's been doing daily dry dressing changes. States presents using an Ace wrap and gauze. 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 There is excellent improvement in the wound. He does have a new blister over the Medial first MT head. Has been on his feet more often for work and mowing his lawn.   Assessment & Plan: Visit Diagnoses:  No diagnosis found.  Plan: Continue daily wound cleansing, great toe blister. antibacterial ointment dressing changes daily. May use Band-Aids. May resume activities and shoe wear as tolerated. Discussed the importance of daily foot checks.   Follow-Up Instructions: Return if symptoms worsen or fail to improve.   Ortho Exam  Patient is alert, oriented, no adenopathy, well-dressed, normal affect, normal respiratory effort. The left foot fifth ray amputation Has healed. There is no eschar and no ulceration no drainage. no surrounding erythema no odor no sign of infection. Does have an intact blister to his left great toe this is filled with serum. there is no surrounding erythema no odor no sign of infection.  Imaging: No results found.  Labs: Lab Results  Component Value Date   HGBA1C 5.9 10/04/2016   HGBA1C 9.2 06/30/2016   REPTSTATUS 06/27/2016 FINAL 06/22/2016   CULT NO GROWTH 5 DAYS 06/22/2016    Orders:  No orders of the defined types were placed in this encounter.  No orders of the defined types were placed in this encounter.    Procedures: No  procedures performed  Clinical Data: No additional findings.  ROS:  All other systems negative, except as noted in the HPI. Review of Systems  Constitutional: Negative for chills and fever.  Skin: Positive for wound. Negative for color change.    Objective: Vital Signs: Ht 6\' 1"  (1.854 m)   Wt (!) 312 lb (141.5 kg)   BMI 41.16 kg/m   Specialty Comments:  No specialty comments available.  PMFS History: Patient Active Problem List   Diagnosis Date Noted  . Morbid obesity (HCC) 01/09/2017  . Pulmonary HTN (HCC) 12/11/2016  . Type 2 diabetes mellitus with complication, with long-term current use of insulin (HCC) 12/11/2016  . Diastolic CHF (HCC) 11/24/2016  . Insomnia 11/24/2016  . Foot amputation status, left (HCC) 08/22/2016  . Atrial fibrillation (HCC) 06/30/2016  . Essential hypertension 06/30/2016  . Cellulitis of left foot 06/22/2016  . Controlled type 2 diabetes mellitus with hyperglycemia (HCC) 06/22/2016   Past Medical History:  Diagnosis Date  . Cellulitis and abscess of left leg 06/2016  . Diabetes (HCC)     Family History  Problem Relation Age of Onset  . Diabetes Mellitus II Mother        ESRD  . Diabetes Mellitus II Brother   . Emphysema Father     Past Surgical History:  Procedure Laterality Date  . AMPUTATION Left 06/24/2016  Procedure: FOOT FIFTH RAY TOE AMPUTATION;  Surgeon: [name], MD;  Location: [facility] OR;  Service: Orthopedics;  Laterality: Left;  . open heart surgery     As a child.  [age] years old.  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    Social History   Occupational History  . Not on file.   Social History Main Topics  . Smoking status: Never Smoker  . Smokeless tobacco: Never Used  . Alcohol use No     Comment: gave up drinking ~20 years ago  . Drug use: No     Comment: not since the 1st of January  . Sexual activity: Not on file

## 2017-02-08 ENCOUNTER — Encounter: Payer: Self-pay | Admitting: Family Medicine

## 2017-02-08 ENCOUNTER — Ambulatory Visit: Payer: Self-pay | Attending: Family Medicine | Admitting: Family Medicine

## 2017-02-08 VITALS — BP 106/67 | HR 56 | Temp 98.2°F | Wt 312.6 lb

## 2017-02-08 DIAGNOSIS — L97423 Non-pressure chronic ulcer of left heel and midfoot with necrosis of muscle: Secondary | ICD-10-CM

## 2017-02-08 DIAGNOSIS — E11621 Type 2 diabetes mellitus with foot ulcer: Secondary | ICD-10-CM

## 2017-02-08 DIAGNOSIS — L97529 Non-pressure chronic ulcer of other part of left foot with unspecified severity: Secondary | ICD-10-CM | POA: Insufficient documentation

## 2017-02-08 DIAGNOSIS — I5031 Acute diastolic (congestive) heart failure: Secondary | ICD-10-CM

## 2017-02-08 DIAGNOSIS — Z7984 Long term (current) use of oral hypoglycemic drugs: Secondary | ICD-10-CM | POA: Insufficient documentation

## 2017-02-08 DIAGNOSIS — E1149 Type 2 diabetes mellitus with other diabetic neurological complication: Secondary | ICD-10-CM

## 2017-02-08 DIAGNOSIS — Z23 Encounter for immunization: Secondary | ICD-10-CM

## 2017-02-08 DIAGNOSIS — Z7901 Long term (current) use of anticoagulants: Secondary | ICD-10-CM | POA: Insufficient documentation

## 2017-02-08 DIAGNOSIS — E118 Type 2 diabetes mellitus with unspecified complications: Secondary | ICD-10-CM

## 2017-02-08 DIAGNOSIS — Z794 Long term (current) use of insulin: Secondary | ICD-10-CM

## 2017-02-08 DIAGNOSIS — I272 Pulmonary hypertension, unspecified: Secondary | ICD-10-CM

## 2017-02-08 DIAGNOSIS — Z89422 Acquired absence of other left toe(s): Secondary | ICD-10-CM | POA: Insufficient documentation

## 2017-02-08 DIAGNOSIS — I10 Essential (primary) hypertension: Secondary | ICD-10-CM

## 2017-02-08 DIAGNOSIS — I480 Paroxysmal atrial fibrillation: Secondary | ICD-10-CM

## 2017-02-08 DIAGNOSIS — G4730 Sleep apnea, unspecified: Secondary | ICD-10-CM | POA: Insufficient documentation

## 2017-02-08 DIAGNOSIS — I110 Hypertensive heart disease with heart failure: Secondary | ICD-10-CM | POA: Insufficient documentation

## 2017-02-08 LAB — GLUCOSE, POCT (MANUAL RESULT ENTRY): POC GLUCOSE: 99 mg/dL (ref 70–99)

## 2017-02-08 LAB — POCT GLYCOSYLATED HEMOGLOBIN (HGB A1C): Hemoglobin A1C: 5.4

## 2017-02-08 MED ORDER — GLIPIZIDE 5 MG PO TABS: 2.5000 mg | ORAL_TABLET | Freq: Two times a day (BID) | ORAL | 3 refills | Status: DC

## 2017-02-08 MED ORDER — POTASSIUM CHLORIDE CRYS ER 20 MEQ PO TBCR: 20.0000 meq | EXTENDED_RELEASE_TABLET | Freq: Every day | ORAL | 3 refills | Status: DC

## 2017-02-08 MED ORDER — GABAPENTIN 300 MG PO CAPS: ORAL_CAPSULE | ORAL | 3 refills | Status: DC

## 2017-02-08 MED ORDER — METOPROLOL TARTRATE 50 MG PO TABS: 50.0000 mg | ORAL_TABLET | Freq: Two times a day (BID) | ORAL | 3 refills | Status: DC

## 2017-02-08 MED ORDER — METFORMIN HCL 500 MG PO TABS: ORAL_TABLET | ORAL | 3 refills | Status: DC

## 2017-02-08 MED ORDER — ATORVASTATIN CALCIUM 20 MG PO TABS: 20.0000 mg | ORAL_TABLET | Freq: Every day | ORAL | 3 refills | Status: DC

## 2017-02-08 MED ORDER — LISINOPRIL 40 MG PO TABS: 40.0000 mg | ORAL_TABLET | Freq: Every day | ORAL | 3 refills | Status: DC

## 2017-02-08 NOTE — Patient Instructions (Signed)
Reduce glipizide dose to 2.5 mg twice daily.

## 2017-02-08 NOTE — Progress Notes (Signed)
Subjective:  Patient ID: Paul Mclaughlin, male    DOB: [birth year]  Age: 61 y.o. 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  He Is closely followed by cardiology for his congestive heart failure. Reports improvement in pedal edema but does have some residual abdominal edema. His weight is down 10 pounds from his last visit with me 6 weeks ago. He is not short of breath.   CXR  (11/09/16) - cardiomegaly with small pleural effusions, vascular congestion and mild pulmonary edema  2-D echo (11/28/16) - moderate LVH, normal systolic function, no regional wall motion abnormalities, LVEF of 55-60%. Paradoxical ventricular septal motion consistent with RV pressure overload, moderately to severely dilated left atrium, severely dilated right ventricle, moderately to severely reduced right ventricular systolic function. Severely dilated right atrium, mild-to-moderate tricuspid regurg, moderately to severely increased PA pressures, trivial pericardial effusion, right pleural effusion.  He had complained of seeing white spots in his eyes and this clinic had gotten in touch with him providing resources in the community where he could have eye evaluations given he had no medical coverage for referral however the patient is yet to make any appointment. Today he informs me that the white spots clear when he eats; of note his A1c is 5.4 today. His fasting sugars have been in the 80s.  Past Medical History:  Diagnosis Date  . Cellulitis and abscess of left leg 06/2016  .  Diabetes [facility])     Past Surgical History:  Procedure Laterality Date  . AMPUTATION Left 06/24/2016   Procedure: FOOT FIFTH RAY TOE AMPUTATION;  Surgeon: Newt Minion, MD;  Location: [place];  Service: Orthopedics;  Laterality: Left;  . open heart surgery     As a child.  59 years old.  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     No Known Allergies    Outpatient Medications Prior to Visit  Medication Sig Dispense Refill  . albuterol (PROVENTIL HFA;VENTOLIN HFA) 108 (90 Base) MCG/ACT inhaler Inhale 2 puffs into the lungs every 6 (six) hours as needed for wheezing or shortness of breath. 1 Inhaler 0  . blood glucose meter kit and supplies KIT Dispense based on patient and insurance preference. Use up to four times daily as directed. (FOR ICD-9 250.00, 250.01). 1 each 0  . Blood Glucose Monitoring Suppl (TRUE METRIX METER) w/Device KIT Use as directed 1 kit 0  . ciclopirox (PENLAC) 8 % solution Apply topically at bedtime. Apply over nail and surrounding skin. After seven (7) days, may remove with alcohol and continue cycle. 6.6 mL 1  . furosemide (LASIX) 40 MG tablet Take 1 tablet (40 mg total) by mouth 2 (two) times daily. 60 tablet 6  . glucose blood (TRUE METRIX BLOOD GLUCOSE TEST) test strip Use as instructed 100 each 12  . hydrALAZINE (APRESOLINE) 50 MG tablet Take 1 tablet (50 mg total) by mouth 3 (three) times daily. 90 tablet 11  . nitroGLYCERIN (NITRODUR - DOSED IN MG/24 HR) 0.2 mg/hr patch Place 1 patch (0.2 mg total) onto the skin daily. Change the patch location daily applied to top of foot. [place]  patch 3  . rivaroxaban (XARELTO) 20 MG TABS tablet Take 1 tablet (20 mg total) by mouth daily with supper. 90 tablet 3  . silver sulfADIAZINE (SILVADENE) 1 % cream Apply 1 application topically daily. 400 g 3  . traZODone (DESYREL) 100 MG tablet Take 1 tablet (100 mg total) by mouth at bedtime as needed for sleep. 30 tablet 3  . TRUEPLUS INSULIN SYRINGE 30G X 5/16" 0.3 ML MISC USE AS DIRECTED 100 each 5  .  TRUEPLUS LANCETS 28G MISC Use as directed 100 each 5  . atorvastatin (LIPITOR) 20 MG tablet TAKE 1 TABLET BY MOUTH DAILY. 30 tablet 3  . gabapentin (NEURONTIN) 300 MG capsule TAKE 1 CAPSULE BY MOUTH 2 TIMES DAILY. 60 capsule 3  . glipiZIDE (GLUCOTROL) 5 MG tablet Take 1 tablet (5 mg total) by mouth 2 (two) times daily before a meal. 60 tablet 3  . lisinopril (PRINIVIL,ZESTRIL) 40 MG tablet Take 1 tablet (40 mg total) by mouth daily. 30 tablet 3  . metFORMIN (GLUCOPHAGE) 500 MG tablet TAKE 1 TABLET BY MOUTH 2 TIMES DAILY WITH A MEAL. 60 tablet 3  . metoprolol (LOPRESSOR) 50 MG tablet TAKE 1 TABLET BY MOUTH 2 TIMES DAILY. 60 tablet 3  . potassium chloride SA (K-DUR,KLOR-CON) 20 MEQ tablet Take 1 tablet (20 mEq total) by mouth daily. 30 tablet 3   No facility-administered medications prior to visit.     ROS Review of Systems  Constitutional: Negative for activity change and appetite change.  HENT: Negative for sinus pressure and sore throat.   Eyes: Negative for visual disturbance.  Respiratory: Negative for cough, chest tightness and shortness of breath.   Cardiovascular: Negative for chest pain and leg swelling.  Gastrointestinal: Positive for abdominal distention. Negative for abdominal pain, constipation and diarrhea.  Endocrine: Negative.   Genitourinary: Negative for dysuria.  Musculoskeletal: Negative for joint swelling and myalgias.  Skin: Negative for rash.  Allergic/Immunologic: Negative.   Neurological: Negative for weakness, light-headedness and numbness.  Psychiatric/Behavioral: Negative for dysphoric mood and suicidal ideas.    Objective:  BP 106/67   Pulse (!) 56   Temp 98.2 F (36.8 C) (Oral)   Wt (!) 312 lb 9.6 oz (141.8 kg)   SpO2 96%   BMI 41.24 kg/m   BP/Weight 02/08/2017 02/03/2017 05/14/4096  Systolic BP 353 - 299  Diastolic BP 67 - 82  Wt. (Lbs) 312.6 312 312  BMI 41.24 41.16 41.16      Physical Exam Constitutional: He is oriented to person, place,  and time. He appears well-developed and well-nourished.  Neck: No JVD present.  Cardiovascular: Normal rate, normal heart sounds and intact distal pulses.   No murmur heard. Pulmonary/Chest: Effort normal and breath sounds normal. He has no wheezes. He has no rales. He exhibits no tenderness.  Abdominal: Soft. Bowel sounds are normal. He exhibits slight abdominal distension, 1+ edema. He exhibits no mass. There is no tenderness.  Musculoskeletal: Normal range of motion. He exhibits no edema  Neurological: He is alert and oriented to person, place, and time.  Psychiatric: He has a normal mood and affect.   Lab Results  Component Value Date   HGBA1C 5.4 02/08/2017    Assessment & Plan:   1. Type 2 diabetes mellitus with complication, without long-term current use of insulin (HCC) Controlled with A1c of 5.4 Reduced dose of glipizide as he could be having hypoglycemia which could explain his visual symptoms We'll consider discontinuing glipizide altogether if symptoms persist - POCT glucose (  manual entry) - POCT glycosylated hemoglobin (Hb A1C) - atorvastatin (LIPITOR) 20 MG tablet; Take 1 tablet (20 mg total) by mouth daily.  Dispense: 30 tablet; Refill: 3 - glipiZIDE (GLUCOTROL) 5 MG tablet; Take 0.5 tablets (2.5 mg total) by mouth 2 (two) times daily before a meal.  Dispense: 30 tablet; Refill: 3 - metFORMIN (GLUCOPHAGE) 500 MG tablet; TAKE 1 TABLET BY MOUTH 2 TIMES DAILY WITH A MEAL.  Dispense: 60 tablet; Refill: 3  2. Type 2 diabetes mellitus with other neurologic complication, without long-term current use of insulin (HCC) - gabapentin (NEURONTIN) 300 MG capsule; TAKE 1 CAPSULE BY MOUTH 2 TIMES DAILY.  Dispense: 60 capsule; Refill: 3  3. Diabetic ulcer of left midfoot associated with type 2 diabetes mellitus, with necrosis of muscle (HCC) Resolved  4. Essential hypertension Controlled - metoprolol tartrate (LOPRESSOR) 50 MG tablet; Take 1 tablet (50 mg total) by mouth 2 (two)  times daily.  Dispense: 60 tablet; Refill: 3 - lisinopril (PRINIVIL,ZESTRIL) 40 MG tablet; Take 1 tablet (40 mg total) by mouth daily.  Dispense: 30 tablet; Refill: 3  5. Paroxysmal atrial fibrillation (HCC) Currently on rate control with metoprolol and anticoagulation with Xarelto  6. Acute diastolic congestive heart failure (HCC) EF 55-60% from 2-D echo 11/2016 Euvolemic at this time Continue Lasix Keep appointment with cardiology - potassium chloride SA (K-DUR,KLOR-CON) 20 MEQ tablet; Take 1 tablet (20 mEq total) by mouth daily.  Dispense: 30 tablet; Refill: 3  7. Pulmonary HTN ([place]) May need cardiac cath down the road ? Underlying sleep apnea  8. HCM Refuses colonoscopy due to cost  Meds ordered this encounter  Medications  . atorvastatin (LIPITOR) 20 MG tablet    Sig: Take 1 tablet (20 mg total) by mouth daily.    Dispense:  30 tablet    Refill:  3  . glipiZIDE (GLUCOTROL) 5 MG tablet    Sig: Take 0.5 tablets (2.5 mg total) by mouth 2 (two) times daily before a meal.    Dispense:  30 tablet    Refill:  3    Discontinue previous dose  . metFORMIN (GLUCOPHAGE) 500 MG tablet    Sig: TAKE 1 TABLET BY MOUTH 2 TIMES DAILY WITH A MEAL.    Dispense:  60 tablet    Refill:  3  . metoprolol tartrate (LOPRESSOR) 50 MG tablet    Sig: Take 1 tablet (50 mg total) by mouth 2 (two) times daily.    Dispense:  60 tablet    Refill:  3  . potassium chloride SA (K-DUR,KLOR-CON) 20 MEQ tablet    Sig: Take 1 tablet (20 mEq total) by mouth daily.    Dispense:  30 tablet    Refill:  3  . gabapentin (NEURONTIN) 300 MG capsule    Sig: TAKE 1 CAPSULE BY MOUTH 2 TIMES DAILY.    Dispense:  60 capsule    Refill:  3  . lisinopril (PRINIVIL,ZESTRIL) 40 MG tablet    Sig: Take 1 tablet (40 mg total) by mouth daily.    Dispense:  30 tablet    Refill:  3    Discontinue previous dose    Follow-up: Return in about 3 months (around 05/11/2017) for Follow-up on diabetes mellitus and congestive heart  failure.   Arnoldo Morale MD

## 2017-02-13 ENCOUNTER — Ambulatory Visit: Payer: Self-pay | Attending: Family Medicine

## 2017-02-14 MED FILL — LISINOPRIL 40 MG TABLET: 40 | 30 days supply | Qty: 30 | Fill #0

## 2017-02-14 MED FILL — XARELTO 20 MG TABLET: 20 | 30 days supply | Qty: 30 | Fill #7

## 2017-02-20 ENCOUNTER — Other Ambulatory Visit: Payer: Self-pay | Admitting: Family Medicine

## 2017-02-20 DIAGNOSIS — I480 Paroxysmal atrial fibrillation: Secondary | ICD-10-CM

## 2017-02-20 MED FILL — POTASSIUM CL ER 20 MEQ TAB: 20 | 30 days supply | Qty: 30 | Fill #3

## 2017-02-20 MED FILL — ?METOPROLOL 50 MG TABLET: 50 | 30 days supply | Qty: 60 | Fill #0

## 2017-02-20 MED FILL — metFORMIN HCL 500 MG TABS: 500 | 30 days supply | Qty: 60 | Fill #2

## 2017-02-20 MED FILL — ATORVASTATIN 20 MG TABLET: 20 | 30 days supply | Qty: 30 | Fill #3

## 2017-02-28 MED FILL — hydrALAZINE HCL 50 MG TABS: 50 | 30 days supply | Qty: 90 | Fill #2

## 2017-03-07 ENCOUNTER — Other Ambulatory Visit: Payer: Self-pay | Admitting: Family Medicine

## 2017-03-07 DIAGNOSIS — E1149 Type 2 diabetes mellitus with other diabetic neurological complication: Secondary | ICD-10-CM

## 2017-03-07 DIAGNOSIS — Z794 Long term (current) use of insulin: Principal | ICD-10-CM

## 2017-03-07 MED FILL — ?FUROSEMIDE 40 MG TABLET: 40 | 30 days supply | Qty: 60 | Fill #2

## 2017-03-07 MED FILL — GABAPENTIN 300 MG CAPSULE: 300 | 30 days supply | Qty: 60 | Fill #3

## 2017-03-08 ENCOUNTER — Ambulatory Visit: Payer: Self-pay | Attending: Family Medicine | Admitting: Family Medicine

## 2017-03-08 ENCOUNTER — Encounter: Payer: Self-pay | Admitting: Family Medicine

## 2017-03-08 VITALS — BP 144/76 | HR 53 | Temp 98.0°F | Wt 298.0 lb

## 2017-03-08 DIAGNOSIS — I4891 Unspecified atrial fibrillation: Secondary | ICD-10-CM | POA: Insufficient documentation

## 2017-03-08 DIAGNOSIS — E11621 Type 2 diabetes mellitus with foot ulcer: Secondary | ICD-10-CM

## 2017-03-08 DIAGNOSIS — I10 Essential (primary) hypertension: Secondary | ICD-10-CM | POA: Insufficient documentation

## 2017-03-08 DIAGNOSIS — I272 Pulmonary hypertension, unspecified: Secondary | ICD-10-CM | POA: Insufficient documentation

## 2017-03-08 DIAGNOSIS — E118 Type 2 diabetes mellitus with unspecified complications: Secondary | ICD-10-CM

## 2017-03-08 DIAGNOSIS — L97529 Non-pressure chronic ulcer of other part of left foot with unspecified severity: Secondary | ICD-10-CM

## 2017-03-08 DIAGNOSIS — Z7901 Long term (current) use of anticoagulants: Secondary | ICD-10-CM | POA: Insufficient documentation

## 2017-03-08 DIAGNOSIS — I509 Heart failure, unspecified: Secondary | ICD-10-CM | POA: Insufficient documentation

## 2017-03-08 DIAGNOSIS — Z794 Long term (current) use of insulin: Secondary | ICD-10-CM

## 2017-03-08 LAB — GLUCOSE, POCT (MANUAL RESULT ENTRY): POC Glucose: 92 mg/dl (ref 70–99)

## 2017-03-08 MED ORDER — CEPHALEXIN 500 MG PO CAPS: 500.0000 mg | ORAL_CAPSULE | Freq: Two times a day (BID) | ORAL | 0 refills | Status: DC

## 2017-03-08 MED FILL — CEPHALEXIN 500 MG CAPSULE: 500 | 10 days supply | Qty: 20 | Fill #0

## 2017-03-08 NOTE — Progress Notes (Signed)
Subjective:  Patient ID: Paul Mclaughlin, male    DOB: 09-14-1955  Age: 61 y.o. 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  At his visit with orthopedics last month this had been documented and he informs me that for the last 12 days he has tried to be off his foot and elevated his legs but notices bruising but no purulent discharge. He denies fever or swelling of his legs. He has been released from orthopedic care.  Past Medical History:  Diagnosis Date  . Cellulitis and abscess of left leg 06/2016  . Diabetes [facility])     Past Surgical History:  Procedure Laterality Date  . AMPUTATION Left 06/24/2016   Procedure: FOOT FIFTH RAY TOE AMPUTATION;  Surgeon: Newt Minion, MD;  Location: [facility];  Service: Orthopedics;  Laterality: Left;  . open heart surgery     As a child.  26 years old.  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     No Known Allergies    Outpatient Medications Prior to Visit  Medication Sig Dispense Refill  . albuterol (PROVENTIL HFA;VENTOLIN HFA) 108 (90 Base) MCG/ACT inhaler Inhale 2 puffs into the lungs every 6 (six) hours as needed for wheezing or shortness of breath. 1 Inhaler 0  . atorvastatin (LIPITOR) 20 MG tablet Take 1 tablet (20 mg total) by mouth daily. 30 tablet 3  . blood glucose meter kit and supplies KIT Dispense based on patient and insurance preference. Use up to four times daily as directed. (FOR ICD-9 250.00, 250.01). 1 each 0  . Blood Glucose Monitoring Suppl (TRUE METRIX METER) w/Device KIT Use as directed 1 kit 0  . ciclopirox  (PENLAC) 8 % solution Apply topically at bedtime. Apply over nail and surrounding skin. After seven (7) days, may remove with alcohol and continue cycle. 6.6 mL 1  . furosemide (LASIX) 40 MG tablet Take 1 tablet (40 mg total) by mouth 2 (two) times daily. 60 tablet 6  . gabapentin (NEURONTIN) 300 MG capsule TAKE 1 CAPSULE BY MOUTH 2 TIMES DAILY. 60 capsule 3  . glipiZIDE (GLUCOTROL) 5 MG tablet Take 0.5 tablets (2.5 mg total) by mouth 2 (two) times daily before a meal. 30 tablet 3  . glucose blood (TRUE METRIX BLOOD GLUCOSE TEST) test strip Use as instructed 100 each 12  . hydrALAZINE (APRESOLINE) 50 MG tablet Take 1 tablet (50 mg total) by mouth 3 (three) times daily. 90 tablet 11  . lisinopril (PRINIVIL,ZESTRIL) 40 MG tablet Take 1 tablet (40 mg total) by mouth daily. 30 tablet 3  . metFORMIN (GLUCOPHAGE) 500 MG tablet TAKE 1 TABLET BY MOUTH 2 TIMES DAILY WITH A MEAL. 60 tablet 3  . metoprolol tartrate (LOPRESSOR) 50 MG tablet Take 1 tablet (50 mg total) by mouth 2 (two) times daily. 60 tablet 3  . nitroGLYCERIN (NITRODUR - DOSED IN MG/24 HR) 0.2 mg/hr patch Place 1 patch (0.2 mg total) onto the skin daily. Change the patch location daily applied to top of foot. 30 patch 3  . potassium chloride SA (K-DUR,KLOR-CON) 20 MEQ tablet Take 1 tablet (20 mEq total) by  mouth daily. 30 tablet 3  . rivaroxaban (XARELTO) 20 MG TABS tablet Take 1 tablet (20 mg total) by mouth daily with supper. 90 tablet 3  . silver sulfADIAZINE (SILVADENE) 1 % cream Apply 1 application topically daily. 400 g 3  . traZODone (DESYREL) 100 MG tablet Take 1 tablet (100 mg total) by mouth at bedtime as needed for sleep. 30 tablet 3  . TRUEPLUS INSULIN SYRINGE 30G X 5/16" 0.3 ML MISC USE AS DIRECTED 100 each 5  . TRUEPLUS LANCETS 28G MISC Use as directed 100 each 5   No facility-administered medications prior to visit.     ROS Review of Systems Constitutional: Negative for activity change and appetite change.  HENT: Negative  for sinus pressure and sore throat.   Eyes: Negative for visual disturbance.  Respiratory: Negative for cough, chest tightness and shortness of breath.   Cardiovascular: Negative for chest pain and leg swelling.  Gastrointestinal:  Negative for abdominal pain, constipation and diarrhea.  Endocrine: Negative.   Genitourinary: Negative for dysuria.  Musculoskeletal: Negative for joint swelling and myalgias.  Skin: Positive for wound Allergic/Immunologic: Negative.   Neurological: Negative for weakness, light-headedness and numbness.  Psychiatric/Behavioral: Negative for dysphoric mood and suicidal ideas.   Objective:  BP (!) 144/76   Pulse (!) 53   Temp 98 F (36.7 C) (Oral)   Wt 298 lb (135.2 kg)   SpO2 97%   BMI 39.32 kg/m   BP/Weight 03/08/2017 02/08/2017 4/88/8916  Systolic BP 945 038 -  Diastolic BP 76 67 -  Wt. (Lbs) 298 312.6 312  BMI 39.32 41.24 41.16      Physical Exam Constitutional: He is oriented to person, place, and time. He appears well-developed and well-nourished.  Neck: No JVD present.  Cardiovascular: Normal rate, normal heart sounds and intact distal pulses.   No murmur heard. Pulmonary/Chest: Effort normal and breath sounds normal. He has no wheezes. He has no rales. He exhibits no tenderness.  Abdominal: Soft. Bowel sounds are normal. He exhibits slight abdominal distension, 1+ edema. He exhibits no mass. There is no tenderness.  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 Lateral aspect of left dorsum  with slight erythema and some weeping from previous surgical incision No purulent discharge  Neurological: He is alert and oriented to person, place, and time.  Psychiatric: He has a normal mood and affect  Lab Results  Component Value Date   HGBA1C 5.4 02/08/2017    Assessment & Plan:   1. Type 2 diabetes mellitus with complication, with long-term current use of insulin (HCC) Controlled with A1c of 5.4 - POCT glucose (manual  entry)  2. Type 2 diabetes mellitus with left diabetic foot ulcer ([place]) Will place on antibiotic prophylactically Dressing applied in the clinic.  Advised to present to the ED or call his orthopedics if symptoms worsen We'll reassess at next visit. - cephALEXin (KEFLEX) 500 MG capsule; Take 1 capsule (500 mg total) by mouth 2 (two) times daily.  Dispense: 20 capsule; Refill: 0   Meds ordered this encounter  Medications  . cephALEXin (KEFLEX) 500 MG capsule    Sig: Take 1 capsule (500 mg total) by mouth 2 (two) times daily.    Dispense:  20 capsule    Refill:  0    Follow-up: Return in about 3 weeks (around 03/29/2017) for Follow-up of left foot ulcer.   This note has been created with Surveyor, quantity. Any transcriptional errors are unintentional.  Arnoldo Morale MD

## 2017-03-15 NOTE — Progress Notes (Signed)
  Cardiology Office Note   Date:  03/16/2017   ID:  Paul Mclaughlin, DOB 01/07/1956, MRN [id number]  PCP:  Amao, Enobong, MD  Cardiologist:   Howell Groesbeck, MD   Chief Complaint  Patient presents with  . Pulmonary HTN      History of Present Illness: Paul Mclaughlin is a 61 y.o. male who presents for follow up of atrial fib    An echo earlier this year demonstrated NL LV function, moderate to severely increased pulmonary pressure, mild to moderate TR, severe RAE and moderate LAE.   In 2016 he had atrial fib at the time of 5th toe amputation secondary to DM uncontrolled.   Of note he reported having had heart surgery at age 5.  We were not able to confirm but this was possibly a VSD repair.    He was seen by his PCP and had an ED visit secondary to 50 lb weight gain in one month.  This was in March.   I increased his diuretic at a previous visit.  He had follow up with Luke Kilroy.  He had significant improvement in his swelling and some improvement in his dyspnea. His weight was down dramatically to 298 but this could've been an error. It's back up to 213 which is still lower than the peak of 235. He has continued abdominal distention and can tell if this is any worse. He does say that his leg swelling is better. He said he only gets short of breath after walking 10 minutes but on further questioning it probably be short of breath climbing a flight of stairs. He's not having any chest pressure, neck or arm discomfort. He's not having any PND or orthopnea. He has had reemergence of a slight wound on his right foot and has had this addressed and followed by his primary provider. He does try to avoid salt. He's not drinking excessive fluid.  Past Medical History:  Diagnosis Date  . Cellulitis and abscess of left leg 06/2016  . Diabetes (HCC)     Past Surgical History:  Procedure Laterality Date  . AMPUTATION Left 06/24/2016   Procedure: FOOT FIFTH RAY TOE AMPUTATION;  Surgeon: Marcus V  Duda, MD;  Location: [facility];  Service: Orthopedics;  Laterality: Left;  . open heart surgery     As a child.  [age] years old.  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      Current Outpatient Prescriptions  Medication Sig Dispense Refill  . albuterol (PROVENTIL HFA;VENTOLIN HFA) 108 (90 Base) MCG/ACT inhaler Inhale 2 puffs into the lungs every 6 (six) hours as needed for wheezing or shortness of breath. 1 Inhaler 0  . atorvastatin (LIPITOR) 20 MG tablet Take 1 tablet (20 mg total) by mouth daily. 30 tablet 3  . blood glucose meter kit and supplies KIT Dispense based on patient and insurance preference. Use up to four times daily as directed. (FOR ICD-9 250.00, 250.01). 1 each 0  . Blood Glucose Monitoring Suppl (TRUE METRIX METER) w/Device KIT Use as directed 1 kit 0  . cephALEXin (KEFLEX) 500 MG capsule Take 1 capsule (500 mg total) by mouth 2 (two) times daily. 20 capsule 0  . ciclopirox (PENLAC) 8 % solution Apply topically at bedtime. Apply over nail and surrounding skin. After seven (7) days, may remove with alcohol and continue cycle. 6.6 mL 1  . furosemide (LASIX) 40 MG tablet Take 1 tablet (40 mg total) by mouth 2 (two) times daily. 60 tablet 6  .   gabapentin (NEURONTIN) 300 MG capsule TAKE 1 CAPSULE BY MOUTH 2 TIMES DAILY. 60 capsule 3  . glipiZIDE (GLUCOTROL) 5 MG tablet Take 0.5 tablets (2.5 mg total) by mouth 2 (two) times daily before a meal. 30 tablet 3  . glucose blood (TRUE METRIX BLOOD GLUCOSE TEST) test strip Use as instructed 100 each 12  . hydrALAZINE (APRESOLINE) 50 MG tablet Take 1 tablet (50 mg total) by mouth 3 (three) times daily. 90 tablet 11  . lisinopril (PRINIVIL,ZESTRIL) 40 MG tablet Take 1 tablet (40 mg total) by mouth daily. 30 tablet 3  . metFORMIN (GLUCOPHAGE) 500 MG tablet TAKE 1 TABLET BY MOUTH 2 TIMES DAILY WITH A MEAL. 60 tablet 3  . metoprolol tartrate (LOPRESSOR) 50 MG tablet Take 1 tablet (50 mg total) by mouth 2 (two) times daily. 60 tablet 3  . nitroGLYCERIN (NITRODUR - DOSED IN  MG/24 HR) 0.2 mg/hr patch Place 1 patch (0.2 mg total) onto the skin daily. Change the patch location daily applied to top of foot. 30 patch 3  . potassium chloride SA (K-DUR,KLOR-CON) 20 MEQ tablet Take 1 tablet (20 mEq total) by mouth daily. 30 tablet 3  . rivaroxaban (XARELTO) 20 MG TABS tablet Take 1 tablet (20 mg total) by mouth daily with supper. 90 tablet 3  . silver sulfADIAZINE (SILVADENE) 1 % cream Apply 1 application topically daily. 400 g 3  . traZODone (DESYREL) 100 MG tablet Take 1 tablet (100 mg total) by mouth at bedtime as needed for sleep. 30 tablet 3  . TRUEPLUS INSULIN SYRINGE 30G X 5/16" 0.3 ML MISC USE AS DIRECTED 100 each 5  . TRUEPLUS LANCETS 28G MISC Use as directed 100 each 5   No current facility-administered medications for this visit.     Allergies:   Patient has no known allergies.    ROS:  Please see the history of present illness.   Otherwise, review of systems are positive for none.   All other systems are reviewed and negative.    PHYSICAL EXAM: VS:  BP 128/70   Pulse 86   Ht 6' 1" (1.854 m)   Wt (!) 313 lb (142 kg)   SpO2 97%   BMI 41.30 kg/m  , BMI Body mass index is 41.3 kg/m.  NECK:  Positive jugular venous distention to the jaw at 45 degrees, waveform within normal limits, carotid upstroke brisk and symmetric, no bruits, no thyromegaly LUNGS:  Clear to auscultation bilaterally BACK:  No CVA tenderness CHEST:  Unremarkable HEART:  PMI not displaced or sustained,S1 and S2 within normal limits, no S3, no clicks, no rubs, no murmurs, irregular ABD:  Distended, positive bowel sounds normal in frequency in pitch, no bruits, no rebound, no guarding, no midline pulsatile mass, no hepatomegaly, no splenomegaly EXT:  2 plus pulses throughout, severe edema to the mid thigh, no cyanosis no clubbing, status post amputation.  SKIN:  No rashes no nodules NEURO:  Cranial nerves II through XII grossly intact, motor grossly intact throughout PSYCH:  Cognitively  intact, oriented to person place and time   EKG:  EKG is not ordered today.   Recent Labs: 06/25/2016: TSH 1.275 11/09/2016: Hemoglobin 13.2; Platelets 194 11/24/2016: Brain Natriuretic Peptide 301.0 12/26/2016: ALT 16; BUN 32; Creatinine, Ser 1.12; Potassium 4.9; Sodium 139    Lipid Panel    Component Value Date/Time   CHOL 131 06/25/2016 0452   TRIG 149 06/25/2016 0452   HDL 15 (L) 06/25/2016 0452   CHOLHDL 8.7 06/25/2016 0452     VLDL 30 06/25/2016 0452   LDLCALC 86 06/25/2016 0452     Lab Results  Component Value Date   HGBA1C 5.4 02/08/2017    Wt Readings from Last 3 Encounters:  03/16/17 (!) 313 lb (142 kg)  03/08/17 298 lb (135.2 kg)  02/08/17 (!) 312 lb 9.6 oz (141.8 kg)      Other studies Reviewed: Additional studies/ records that were reviewed today include:  Labs Review of the above records demonstrates:     ASSESSMENT AND PLAN:   Pulmonary HTN:   I suspect that this is secondary to HTN and diastolic dysfunction.  However, he needs right and left heart cath for pressures to evaluate for possible precapillary Type I pulm HTN.  I will set him up for this and today he agrees.  The patient understands that risks included but are not limited to stroke (1 in 1000), death (1 in 1000), kidney failure [usually temporary] (1 in 500), bleeding (1 in 200), allergic reaction [possibly serious] (1 in 200).  The patient understands and agrees to proceed.   Edema:   I talked to him previously about keeping his feet up and about salt and fluid.  He will continue the current dose diuretic.  We will adjust based on readings as above.    DM:   His A1C is as above well controlled.  No change in therapy is indicated.   Persistent Atrial Fib:  Mr. Lyfe W Scovill has a CHA2DS2 - VASc score of 2.   He remains on Xarelto.   He can hold this for two days prior to his procedure.    Congenital Heart Disease:   I see no residual of a VSD or other but he might need TEE in the future with  probable right heart cath as above.  Visual disturbance:   He was complaining of this previously but this was related to low blood sugars.     Current medicines are reviewed at length with the patient today.  The patient does not have concerns regarding medicines.  The following changes have been made:  None  Labs/ tests ordered today include:    Orders Placed This Encounter  Procedures  . Basic metabolic panel  . INR/PT  . CBC     Disposition:   FU with me in after the left and right heart cath.    Signed, Nilah Belcourt, MD  03/16/2017 9:41 PM    [facility]   

## 2017-03-16 ENCOUNTER — Ambulatory Visit (INDEPENDENT_AMBULATORY_CARE_PROVIDER_SITE_OTHER): Payer: Self-pay | Admitting: Cardiology

## 2017-03-16 ENCOUNTER — Encounter: Payer: Self-pay | Admitting: Cardiology

## 2017-03-16 VITALS — BP 128/70 | HR 86 | Ht 73.0 in | Wt 313.0 lb

## 2017-03-16 DIAGNOSIS — Z01812 Encounter for preprocedural laboratory examination: Secondary | ICD-10-CM

## 2017-03-16 DIAGNOSIS — I481 Persistent atrial fibrillation: Secondary | ICD-10-CM

## 2017-03-16 DIAGNOSIS — I272 Pulmonary hypertension, unspecified: Secondary | ICD-10-CM

## 2017-03-16 DIAGNOSIS — I4819 Other persistent atrial fibrillation: Secondary | ICD-10-CM

## 2017-03-16 MED FILL — LISINOPRIL 40 MG TABLET: 40 | 30 days supply | Qty: 30 | Fill #1

## 2017-03-16 NOTE — Patient Instructions (Addendum)
Medication Instructions:  Your physician recommends that you continue on your current medications as directed. Please refer to the Current Medication list given to you today.  Labwork: CBC, BMET, PT/INR-pre procedure labs  Testing/Procedures: A nurse will contact you to schedule a heart cath with Dr. Shirlee LatchMclean  Follow-Up: Pending Cath.  Any Other Special Instructions Will Be Listed Below (If Applicable).     If you need a refill on your cardiac medications before your next appointment, please call your pharmacy.

## 2017-03-17 ENCOUNTER — Telehealth: Payer: Self-pay | Admitting: *Deleted

## 2017-03-17 DIAGNOSIS — D649 Anemia, unspecified: Secondary | ICD-10-CM

## 2017-03-17 LAB — BASIC METABOLIC PANEL
BUN/Creatinine Ratio: 22 (ref 10–24)
BUN: 26 mg/dL (ref 8–27)
CALCIUM: 8.9 mg/dL (ref 8.6–10.2)
CHLORIDE: 101 mmol/L (ref 96–106)
CO2: 22 mmol/L (ref 20–29)
CREATININE: 1.19 mg/dL (ref 0.76–1.27)
GFR calc Af Amer: 76 mL/min/{1.73_m2} (ref >59)
GFR calc non Af Amer: 66 mL/min/{1.73_m2} (ref >59)
GLUCOSE: 121 mg/dL — AB (ref 65–99)
Potassium: 4.2 mmol/L (ref 3.5–5.2)
Sodium: 139 mmol/L (ref 134–144)

## 2017-03-17 LAB — CBC
Hematocrit: 30.1 % — ABNORMAL LOW (ref 37.5–51.0)
Hemoglobin: 9.9 g/dL — ABNORMAL LOW (ref 13.0–17.7)
MCH: 31.9 pg (ref 26.6–33.0)
MCHC: 32.9 g/dL (ref 31.5–35.7)
MCV: 97 fL (ref 79–97)
PLATELETS: 222 10*3/uL (ref 150–379)
RBC: 3.1 x10E6/uL — AB (ref 4.14–5.80)
RDW: 13.8 % (ref 12.3–15.4)
WBC: 5.4 10*3/uL (ref 3.4–10.8)

## 2017-03-17 LAB — PROTIME-INR
INR: 1.2 (ref 0.8–1.2)
Prothrombin Time: 12.6 s — ABNORMAL HIGH (ref 9.1–12.0)

## 2017-03-17 NOTE — Telephone Encounter (Addendum)
-----   Message from Rollene RotundaJames Hochrein, MD sent at 03/17/2017  7:44 AM EDT ----- Hgb is low.  Ask patient to come in for stool guaiac cards.  Follow up CBC in one week.  Call [name] with the results and send results to Jaclyn ShaggyAmao, Enobong, MD   Results forwarded to PCP Left message for pt to call  Lab orders placed, the patient will need to go to [facility] on the 1st floor in the [address] office.

## 2017-03-20 ENCOUNTER — Telehealth (HOSPITAL_COMMUNITY): Payer: Self-pay | Admitting: *Deleted

## 2017-03-20 ENCOUNTER — Other Ambulatory Visit: Payer: Self-pay | Admitting: Family Medicine

## 2017-03-20 DIAGNOSIS — I509 Heart failure, unspecified: Secondary | ICD-10-CM

## 2017-03-20 DIAGNOSIS — Z794 Long term (current) use of insulin: Secondary | ICD-10-CM

## 2017-03-20 DIAGNOSIS — E1149 Type 2 diabetes mellitus with other diabetic neurological complication: Secondary | ICD-10-CM

## 2017-03-20 MED FILL — ?METOPROLOL 50 MG TABLET: 50 | 30 days supply | Qty: 60 | Fill #1

## 2017-03-20 MED FILL — XARELTO 20 MG TABLET: 20 | 30 days supply | Qty: 30 | Fill #8

## 2017-03-20 MED FILL — ?METFORMIN HCL 500MG TABLET: 500 | 30 days supply | Qty: 60 | Fill #3

## 2017-03-20 NOTE — Telephone Encounter (Signed)
TRIAGE FYI  Called pt to schedule cath with Dr.McLean the week of the 16th (per Dr.Hochrein) he did not answer/ no voicemail. Procedure labs completed patient aware that we will be trying to contact him.    Dx- pulm hypertension  ** will need to discuss holding Xarelto with Dr.McLean**

## 2017-03-21 NOTE — Telephone Encounter (Signed)
Patient called back but he needs to discuss with his friend who can drive him which day next week he is free.  I gave me him our number to call us back as soon as he talks with his friend for transportation.

## 2017-03-22 ENCOUNTER — Other Ambulatory Visit: Payer: Self-pay | Admitting: Family Medicine

## 2017-03-22 DIAGNOSIS — I509 Heart failure, unspecified: Secondary | ICD-10-CM

## 2017-03-22 MED FILL — ?ATORVASTATIN 20 MG TABLET: 20 | 30 days supply | Qty: 30 | Fill #0

## 2017-03-23 MED FILL — POTASSIUM CL ER 20 MEQ TAB: 20 | 30 days supply | Qty: 30 | Fill #0

## 2017-03-24 ENCOUNTER — Encounter (HOSPITAL_COMMUNITY): Payer: Self-pay | Admitting: *Deleted

## 2017-03-24 ENCOUNTER — Other Ambulatory Visit (HOSPITAL_COMMUNITY): Payer: Self-pay | Admitting: *Deleted

## 2017-03-24 DIAGNOSIS — I270 Primary pulmonary hypertension: Secondary | ICD-10-CM

## 2017-03-28 ENCOUNTER — Encounter: Payer: Self-pay | Admitting: *Deleted

## 2017-03-28 ENCOUNTER — Telehealth: Payer: Self-pay | Admitting: *Deleted

## 2017-03-28 DIAGNOSIS — Z79899 Other long term (current) drug therapy: Secondary | ICD-10-CM

## 2017-03-28 NOTE — Telephone Encounter (Signed)
Try calling pt again with no answer, letter with instruction and result mailed to pt and CBC ordered and mail to pt

## 2017-03-28 NOTE — Telephone Encounter (Signed)
-----   Message from Rollene RotundaJames Hochrein, MD sent at 03/17/2017  7:44 AM EDT ----- Hgb is low.  Ask patient to come in for stool guaiac cards.  Follow up CBC in one week.  Call [name] with the results and send results to Jaclyn ShaggyAmao, Enobong, MD

## 2017-03-29 ENCOUNTER — Telehealth: Payer: Self-pay | Admitting: Cardiology

## 2017-03-29 NOTE — Telephone Encounter (Signed)
Spoke with pt, pt thought his cath was schedule for today, but it is tomorrow 07/19, advised pt to hold metformin glipizide and his xarelto, pt is aware of all instructions [facility] at [facility]:00 am.

## 2017-03-29 NOTE — Telephone Encounter (Signed)
Returning your call from yesterday. °

## 2017-03-30 ENCOUNTER — Encounter (HOSPITAL_COMMUNITY): Payer: Self-pay | Admitting: Cardiology

## 2017-03-30 ENCOUNTER — Inpatient Hospital Stay (HOSPITAL_COMMUNITY)
Admission: RE | Admit: 2017-03-30 | Discharge: 2017-04-04 | DRG: 287 | Disposition: A | Discharge status: Home / Self Care | Payer: Self-pay | Source: Ambulatory Visit | Attending: Cardiology | Admitting: Cardiology

## 2017-03-30 ENCOUNTER — Encounter (HOSPITAL_COMMUNITY)
Admission: RE | Disposition: A | Discharge status: Home / Self Care | Payer: Self-pay | Source: Ambulatory Visit | Attending: Cardiology

## 2017-03-30 ENCOUNTER — Inpatient Hospital Stay (HOSPITAL_COMMUNITY): Payer: Self-pay

## 2017-03-30 DIAGNOSIS — I2722 Pulmonary hypertension due to left heart disease: Secondary | ICD-10-CM | POA: Diagnosis present

## 2017-03-30 DIAGNOSIS — Z7901 Long term (current) use of anticoagulants: Secondary | ICD-10-CM

## 2017-03-30 DIAGNOSIS — I5084 End stage heart failure: Secondary | ICD-10-CM | POA: Diagnosis present

## 2017-03-30 DIAGNOSIS — I509 Heart failure, unspecified: Secondary | ICD-10-CM

## 2017-03-30 DIAGNOSIS — I5033 Acute on chronic diastolic (congestive) heart failure: Secondary | ICD-10-CM | POA: Diagnosis present

## 2017-03-30 DIAGNOSIS — I270 Primary pulmonary hypertension: Secondary | ICD-10-CM

## 2017-03-30 DIAGNOSIS — I481 Persistent atrial fibrillation: Secondary | ICD-10-CM | POA: Diagnosis present

## 2017-03-30 DIAGNOSIS — Z79899 Other long term (current) drug therapy: Secondary | ICD-10-CM

## 2017-03-30 DIAGNOSIS — I119 Hypertensive heart disease without heart failure: Secondary | ICD-10-CM | POA: Diagnosis present

## 2017-03-30 DIAGNOSIS — Z89422 Acquired absence of other left toe(s): Secondary | ICD-10-CM

## 2017-03-30 DIAGNOSIS — Z8774 Personal history of (corrected) congenital malformations of heart and circulatory system: Secondary | ICD-10-CM

## 2017-03-30 DIAGNOSIS — E119 Type 2 diabetes mellitus without complications: Secondary | ICD-10-CM | POA: Diagnosis present

## 2017-03-30 DIAGNOSIS — I5031 Acute diastolic (congestive) heart failure: Secondary | ICD-10-CM

## 2017-03-30 DIAGNOSIS — I10 Essential (primary) hypertension: Secondary | ICD-10-CM

## 2017-03-30 DIAGNOSIS — Z794 Long term (current) use of insulin: Secondary | ICD-10-CM

## 2017-03-30 DIAGNOSIS — I2510 Atherosclerotic heart disease of native coronary artery without angina pectoris: Secondary | ICD-10-CM | POA: Diagnosis present

## 2017-03-30 DIAGNOSIS — I110 Hypertensive heart disease with heart failure: Principal | ICD-10-CM | POA: Diagnosis present

## 2017-03-30 HISTORY — DX: Dyspnea, unspecified: R06.00

## 2017-03-30 HISTORY — DX: Unspecified atrial fibrillation: I48.91

## 2017-03-30 HISTORY — DX: Heart failure, unspecified: I50.9

## 2017-03-30 HISTORY — PX: RIGHT/LEFT HEART CATH AND CORONARY ANGIOGRAPHY: CATH118266

## 2017-03-30 LAB — GLUCOSE, CAPILLARY
GLUCOSE-CAPILLARY: 151 mg/dL — AB (ref 65–99)
Glucose-Capillary: 126 mg/dL — ABNORMAL HIGH (ref 65–99)
Glucose-Capillary: 136 mg/dL — ABNORMAL HIGH (ref 65–99)

## 2017-03-30 LAB — POCT I-STAT 3, VENOUS BLOOD GAS (G3P V)
Acid-Base Excess: 1 mmol/L (ref 0.0–2.0)
Acid-Base Excess: 2 mmol/L (ref 0.0–2.0)
Acid-Base Excess: 2 mmol/L (ref 0.0–2.0)
BICARBONATE: 26.5 mmol/L (ref 20.0–28.0)
BICARBONATE: 27.1 mmol/L (ref 20.0–28.0)
Bicarbonate: 26.8 mmol/L (ref 20.0–28.0)
O2 SAT: 65 %
O2 Saturation: 61 %
O2 Saturation: 66 %
PCO2 VEN: 43.3 mmHg — AB (ref 44.0–60.0)
PCO2 VEN: 44.3 mmHg (ref 44.0–60.0)
PCO2 VEN: 44.6 mmHg (ref 44.0–60.0)
PH VEN: 7.404 (ref 7.250–7.430)
PO2 VEN: 33 mmHg (ref 32.0–45.0)
PO2 VEN: 34 mmHg (ref 32.0–45.0)
PO2 VEN: 35 mmHg (ref 32.0–45.0)
TCO2: 28 mmol/L (ref 0–100)
TCO2: 28 mmol/L (ref 0–100)
TCO2: 28 mmol/L (ref 0–100)
pH, Ven: 7.384 (ref 7.250–7.430)
pH, Ven: 7.387 (ref 7.250–7.430)

## 2017-03-30 LAB — BASIC METABOLIC PANEL
ANION GAP: 8 (ref 5–15)
BUN: 28 mg/dL — ABNORMAL HIGH (ref 6–20)
CO2: 25 mmol/L (ref 22–32)
Calcium: 9 mg/dL (ref 8.9–10.3)
Chloride: 102 mmol/L (ref 101–111)
Creatinine, Ser: 1.12 mg/dL (ref 0.61–1.24)
GLUCOSE: 128 mg/dL — AB (ref 65–99)
Potassium: 4.4 mmol/L (ref 3.5–5.1)
Sodium: 135 mmol/L (ref 135–145)

## 2017-03-30 LAB — CBC
HEMATOCRIT: 30.6 % — AB (ref 39.0–52.0)
HEMOGLOBIN: 9.9 g/dL — AB (ref 13.0–17.0)
MCH: 31.4 pg (ref 26.0–34.0)
MCHC: 32.4 g/dL (ref 30.0–36.0)
MCV: 97.1 fL (ref 78.0–100.0)
Platelets: 224 10*3/uL (ref 150–400)
RBC: 3.15 MIL/uL — AB (ref 4.22–5.81)
RDW: 14 % (ref 11.5–15.5)
WBC: 8.3 10*3/uL (ref 4.0–10.5)

## 2017-03-30 LAB — PROTIME-INR
INR: 1.25
PROTHROMBIN TIME: 15.8 s — AB (ref 11.4–15.2)

## 2017-03-30 SURGERY — RIGHT/LEFT HEART CATH AND CORONARY ANGIOGRAPHY
Anesthesia: LOCAL

## 2017-03-30 MED ORDER — IOPAMIDOL (ISOVUE-370) INJECTION 76%
INTRAVENOUS | Status: DC | PRN
  Administered 2017-03-30: 90 mL via INTRA_ARTERIAL

## 2017-03-30 MED ORDER — ASPIRIN 81 MG PO CHEW
CHEWABLE_TABLET | ORAL | Status: AC
  Filled 2017-03-30: qty 1

## 2017-03-30 MED ORDER — RIVAROXABAN 20 MG PO TABS: 20.0000 mg | ORAL_TABLET | Freq: Every day | ORAL | Status: DC

## 2017-03-30 MED ORDER — HEPARIN SODIUM (PORCINE) 1000 UNIT/ML IJ SOLN
INTRAMUSCULAR | Status: DC | PRN
  Administered 2017-03-30: 5000 [IU] via INTRAVENOUS

## 2017-03-30 MED ORDER — GLIPIZIDE 5 MG PO TABS
2.5000 mg | ORAL_TABLET | Freq: Two times a day (BID) | ORAL | Status: DC
  Administered 2017-03-31 – 2017-04-04 (×9): 2.5 mg via ORAL
  Filled 2017-03-30 (×9): qty 1

## 2017-03-30 MED ORDER — INSULIN ASPART 100 UNIT/ML ~~LOC~~ SOLN: 0.0000 [IU] | Freq: Every day | SUBCUTANEOUS | Status: DC

## 2017-03-30 MED ORDER — ASPIRIN 81 MG PO CHEW
CHEWABLE_TABLET | ORAL | Status: DC | PRN
  Administered 2017-03-30: 81 mg via ORAL

## 2017-03-30 MED ORDER — SODIUM CHLORIDE 0.9 % IV SOLN: 250.0000 mL | INTRAVENOUS | Status: DC | PRN

## 2017-03-30 MED ORDER — ATORVASTATIN CALCIUM 20 MG PO TABS
20.0000 mg | ORAL_TABLET | Freq: Every day | ORAL | Status: DC
  Administered 2017-03-30 – 2017-04-03 (×5): 20 mg via ORAL
  Filled 2017-03-30 (×5): qty 1

## 2017-03-30 MED ORDER — SODIUM CHLORIDE 0.9% FLUSH: 3.0000 mL | INTRAVENOUS | Status: DC | PRN

## 2017-03-30 MED ORDER — RIVAROXABAN 20 MG PO TABS
20.0000 mg | ORAL_TABLET | Freq: Every day | ORAL | Status: DC
  Administered 2017-03-31 – 2017-04-03 (×4): 20 mg via ORAL
  Filled 2017-03-30 (×5): qty 1

## 2017-03-30 MED ORDER — FUROSEMIDE 10 MG/ML IJ SOLN
80.0000 mg | Freq: Two times a day (BID) | INTRAMUSCULAR | Status: DC
  Administered 2017-03-30 – 2017-03-31 (×2): 80 mg via INTRAVENOUS
  Filled 2017-03-30 (×2): qty 8

## 2017-03-30 MED ORDER — ASPIRIN 81 MG PO CHEW
81.0000 mg | CHEWABLE_TABLET | ORAL | Status: DC
  Administered 2017-03-30: 81 mg via ORAL

## 2017-03-30 MED ORDER — LISINOPRIL 40 MG PO TABS
40.0000 mg | ORAL_TABLET | Freq: Every day | ORAL | Status: DC
  Administered 2017-03-30 – 2017-04-04 (×6): 40 mg via ORAL
  Filled 2017-03-30 (×6): qty 1

## 2017-03-30 MED ORDER — HEPARIN SODIUM (PORCINE) 1000 UNIT/ML IJ SOLN
INTRAMUSCULAR | Status: AC
  Filled 2017-03-30: qty 1

## 2017-03-30 MED ORDER — LIDOCAINE HCL (PF) 1 % IJ SOLN
INTRAMUSCULAR | Status: AC
  Filled 2017-03-30: qty 30

## 2017-03-30 MED ORDER — FENTANYL CITRATE (PF) 100 MCG/2ML IJ SOLN
INTRAMUSCULAR | Status: DC | PRN
  Administered 2017-03-30: 25 ug via INTRAVENOUS

## 2017-03-30 MED ORDER — HEPARIN (PORCINE) IN NACL 2-0.9 UNIT/ML-% IJ SOLN
INTRAMUSCULAR | Status: AC | PRN
  Administered 2017-03-30: 1000 mL

## 2017-03-30 MED ORDER — SODIUM CHLORIDE 0.9 % IV SOLN
250.0000 mL | INTRAVENOUS | Status: DC | PRN
Start: 2017-03-30 — End: 2017-03-30

## 2017-03-30 MED ORDER — METOPROLOL TARTRATE 50 MG PO TABS
50.0000 mg | ORAL_TABLET | Freq: Two times a day (BID) | ORAL | Status: DC
  Administered 2017-03-30 – 2017-04-03 (×8): 50 mg via ORAL
  Filled 2017-03-30 (×9): qty 1

## 2017-03-30 MED ORDER — POTASSIUM CHLORIDE CRYS ER 20 MEQ PO TBCR
20.0000 meq | EXTENDED_RELEASE_TABLET | Freq: Every day | ORAL | Status: DC
  Administered 2017-03-30 – 2017-04-01 (×3): 20 meq via ORAL
  Filled 2017-03-30 (×3): qty 1

## 2017-03-30 MED ORDER — SODIUM CHLORIDE 0.9% FLUSH
3.0000 mL | Freq: Two times a day (BID) | INTRAVENOUS | Status: DC
  Administered 2017-03-30 – 2017-04-04 (×10): 3 mL via INTRAVENOUS

## 2017-03-30 MED ORDER — ONDANSETRON HCL 4 MG/2ML IJ SOLN: 4.0000 mg | Freq: Four times a day (QID) | INTRAMUSCULAR | Status: DC | PRN

## 2017-03-30 MED ORDER — IOPAMIDOL (ISOVUE-370) INJECTION 76%
INTRAVENOUS | Status: AC
  Filled 2017-03-30: qty 100

## 2017-03-30 MED ORDER — VERAPAMIL HCL 2.5 MG/ML IV SOLN
INTRAVENOUS | Status: AC
  Filled 2017-03-30: qty 2

## 2017-03-30 MED ORDER — HYDRALAZINE HCL 50 MG PO TABS
50.0000 mg | ORAL_TABLET | Freq: Three times a day (TID) | ORAL | Status: DC
  Administered 2017-03-30 – 2017-03-31 (×3): 50 mg via ORAL
  Filled 2017-03-30 (×3): qty 1

## 2017-03-30 MED ORDER — SODIUM CHLORIDE 0.9 % IV SOLN
INTRAVENOUS | Status: DC
Start: 2017-03-30 — End: 2017-03-30
  Administered 2017-03-30: 11:00:00 via INTRAVENOUS

## 2017-03-30 MED ORDER — HEPARIN (PORCINE) IN NACL 2-0.9 UNIT/ML-% IJ SOLN
INTRAMUSCULAR | Status: AC
  Filled 2017-03-30: qty 1000

## 2017-03-30 MED ORDER — VERAPAMIL HCL 2.5 MG/ML IV SOLN
INTRAVENOUS | Status: DC | PRN
  Administered 2017-03-30: 10 mL via INTRA_ARTERIAL

## 2017-03-30 MED ORDER — SODIUM CHLORIDE 0.9% FLUSH: 3.0000 mL | Freq: Two times a day (BID) | INTRAVENOUS | Status: DC

## 2017-03-30 MED ORDER — MIDAZOLAM HCL 2 MG/2ML IJ SOLN
INTRAMUSCULAR | Status: AC
  Filled 2017-03-30: qty 2

## 2017-03-30 MED ORDER — ACETAMINOPHEN 325 MG PO TABS: 650.0000 mg | ORAL_TABLET | ORAL | Status: DC | PRN

## 2017-03-30 MED ORDER — LIDOCAINE HCL (PF) 1 % IJ SOLN
INTRAMUSCULAR | Status: DC | PRN
  Administered 2017-03-30 (×2): 2 mL via SUBCUTANEOUS

## 2017-03-30 MED ORDER — MIDAZOLAM HCL 2 MG/2ML IJ SOLN
INTRAMUSCULAR | Status: DC | PRN
  Administered 2017-03-30: 1 mg via INTRAVENOUS

## 2017-03-30 MED ORDER — INSULIN ASPART 100 UNIT/ML ~~LOC~~ SOLN
0.0000 [IU] | Freq: Three times a day (TID) | SUBCUTANEOUS | Status: DC
  Administered 2017-03-30 – 2017-03-31 (×2): 3 [IU] via SUBCUTANEOUS
  Administered 2017-04-04: 2 [IU] via SUBCUTANEOUS

## 2017-03-30 MED ORDER — FENTANYL CITRATE (PF) 100 MCG/2ML IJ SOLN
INTRAMUSCULAR | Status: AC
  Filled 2017-03-30: qty 2

## 2017-03-30 SURGICAL SUPPLY — 11 items

## 2017-03-30 NOTE — H&P (View-Only) (Signed)
Cardiology Office Note   Date:  03/16/2017   ID:  Paul Mclaughlin, DOB 1956-06-08, MRN [id number]  PCP:  Arnoldo Morale, MD  Cardiologist:   Minus Breeding, MD   Chief Complaint  Patient presents with  . Pulmonary HTN      History of Present Illness: Paul Mclaughlin is a 61 y.o. male who presents for follow up of atrial fib    An echo earlier this year demonstrated NL LV function, moderate to severely increased pulmonary pressure, mild to moderate TR, severe RAE and moderate LAE.   In 2016 he had atrial fib at the time of 5th toe amputation secondary to DM uncontrolled.   Of note he reported having had heart surgery at age 10.  We were not able to confirm but this was possibly a VSD repair.    He was seen by his PCP and had an ED visit secondary to 50 lb weight gain in one month.  This was in March.   I increased his diuretic at a previous visit.  He had follow up with Kerin Ransom.  He had significant improvement in his swelling and some improvement in his dyspnea. His weight was down dramatically to 298 but this could've been an error. It's back up to 213 which is still lower than the peak of 235. He has continued abdominal distention and can tell if this is any worse. He does say that his leg swelling is better. He said he only gets short of breath after walking 10 minutes but on further questioning it probably be short of breath climbing a flight of stairs. He's not having any chest pressure, neck or arm discomfort. He's not having any PND or orthopnea. He has had reemergence of a slight wound on his right foot and has had this addressed and followed by his primary provider. He does try to avoid salt. He's not drinking excessive fluid.  Past Medical History:  Diagnosis Date  . Cellulitis and abscess of left leg 06/2016  . Diabetes [facility])     Past Surgical History:  Procedure Laterality Date  . AMPUTATION Left 06/24/2016   Procedure: FOOT FIFTH RAY TOE AMPUTATION;  Surgeon: Newt Minion, MD;  Location: [place];  Service: Orthopedics;  Laterality: Left;  . open heart surgery     As a child.  43 years old.  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      Current Outpatient Prescriptions  Medication Sig Dispense Refill  . albuterol (PROVENTIL HFA;VENTOLIN HFA) 108 (90 Base) MCG/ACT inhaler Inhale 2 puffs into the lungs every 6 (six) hours as needed for wheezing or shortness of breath. 1 Inhaler 0  . atorvastatin (LIPITOR) 20 MG tablet Take 1 tablet (20 mg total) by mouth daily. 30 tablet 3  . blood glucose meter kit and supplies KIT Dispense based on patient and insurance preference. Use up to four times daily as directed. (FOR ICD-9 250.00, 250.01). 1 each 0  . Blood Glucose Monitoring Suppl (TRUE METRIX METER) w/Device KIT Use as directed 1 kit 0  . cephALEXin (KEFLEX) 500 MG capsule Take 1 capsule (500 mg total) by mouth 2 (two) times daily. 20 capsule 0  . ciclopirox (PENLAC) 8 % solution Apply topically at bedtime. Apply over nail and surrounding skin. After seven (7) days, may remove with alcohol and continue cycle. 6.6 mL 1  . furosemide (LASIX) 40 MG tablet Take 1 tablet (40 mg total) by mouth 2 (two) times daily. 60 tablet 6  .  gabapentin (NEURONTIN) 300 MG capsule TAKE 1 CAPSULE BY MOUTH 2 TIMES DAILY. 60 capsule 3  . glipiZIDE (GLUCOTROL) 5 MG tablet Take 0.5 tablets (2.5 mg total) by mouth 2 (two) times daily before a meal. 30 tablet 3  . glucose blood (TRUE METRIX BLOOD GLUCOSE TEST) test strip Use as instructed 100 each 12  . hydrALAZINE (APRESOLINE) 50 MG tablet Take 1 tablet (50 mg total) by mouth 3 (three) times daily. 90 tablet 11  . lisinopril (PRINIVIL,ZESTRIL) 40 MG tablet Take 1 tablet (40 mg total) by mouth daily. 30 tablet 3  . metFORMIN (GLUCOPHAGE) 500 MG tablet TAKE 1 TABLET BY MOUTH 2 TIMES DAILY WITH A MEAL. 60 tablet 3  . metoprolol tartrate (LOPRESSOR) 50 MG tablet Take 1 tablet (50 mg total) by mouth 2 (two) times daily. 60 tablet 3  . nitroGLYCERIN (NITRODUR - DOSED IN  MG/24 HR) 0.2 mg/hr patch Place 1 patch (0.2 mg total) onto the skin daily. Change the patch location daily applied to top of foot. 30 patch 3  . potassium chloride SA (K-DUR,KLOR-CON) 20 MEQ tablet Take 1 tablet (20 mEq total) by mouth daily. 30 tablet 3  . rivaroxaban (XARELTO) 20 MG TABS tablet Take 1 tablet (20 mg total) by mouth daily with supper. 90 tablet 3  . silver sulfADIAZINE (SILVADENE) 1 % cream Apply 1 application topically daily. 400 g 3  . traZODone (DESYREL) 100 MG tablet Take 1 tablet (100 mg total) by mouth at bedtime as needed for sleep. 30 tablet 3  . TRUEPLUS INSULIN SYRINGE 30G X 5/16" 0.3 ML MISC USE AS DIRECTED 100 each 5  . TRUEPLUS LANCETS 28G MISC Use as directed 100 each 5   No current facility-administered medications for this visit.     Allergies:   Patient has no known allergies.    ROS:  Please see the history of present illness.   Otherwise, review of systems are positive for none.   All other systems are reviewed and negative.    PHYSICAL EXAM: VS:  BP 128/70   Pulse 86   Ht _0  (1.854 m)   Wt (!) 313 lb (142 kg)   SpO2 97%   BMI 41.30 kg/m  , BMI Body mass index is 41.3 kg/m.  NECK:  Positive jugular venous distention to the jaw at 45 degrees, waveform within normal limits, carotid upstroke brisk and symmetric, no bruits, no thyromegaly LUNGS:  Clear to auscultation bilaterally BACK:  No CVA tenderness CHEST:  Unremarkable HEART:  PMI not displaced or sustained,S1 and S2 within normal limits, no S3, no clicks, no rubs, no murmurs, irregular ABD:  Distended, positive bowel sounds normal in frequency in pitch, no bruits, no rebound, no guarding, no midline pulsatile mass, no hepatomegaly, no splenomegaly EXT:  2 plus pulses throughout, severe edema to the mid thigh, no cyanosis no clubbing, status post amputation.  SKIN:  No rashes no nodules NEURO:  Cranial nerves II through XII grossly intact, motor grossly intact throughout PSYCH:  Cognitively  intact, oriented to person place and time   EKG:  EKG is not ordered today.   Recent Labs: 06/25/2016: TSH 1.275 11/09/2016: Hemoglobin 13.2; Platelets 194 11/24/2016: Brain Natriuretic Peptide 301.0 12/26/2016: ALT 16; BUN 32; Creatinine, Ser 1.12; Potassium 4.9; Sodium 139    Lipid Panel    Component Value Date/Time   CHOL 131 06/25/2016 0452   TRIG 149 06/25/2016 0452   HDL 15 (L) 06/25/2016 0452   CHOLHDL 8.7 06/25/2016 0452  VLDL 30 06/25/2016 0452   LDLCALC 86 06/25/2016 0452     Lab Results  Component Value Date   HGBA1C 5.4 02/08/2017    Wt Readings from Last 3 Encounters:  03/16/17 (!) 313 lb (142 kg)  03/08/17 298 lb (135.2 kg)  02/08/17 (!) 312 lb 9.6 oz (141.8 kg)      Other studies Reviewed: Additional studies/ records that were reviewed today include:  Labs Review of the above records demonstrates:     ASSESSMENT AND PLAN:   Pulmonary HTN:   I suspect that this is secondary to HTN and diastolic dysfunction.  However, he needs right and left heart cath for pressures to evaluate for possible precapillary Type I pulm HTN.  I will set him up for this and today he agrees.  The patient understands that risks included but are not limited to stroke (1 in 1000), death (1 in 39), kidney failure [usually temporary] (1 in 500), bleeding (1 in 200), allergic reaction [possibly serious] (1 in 200).  The patient understands and agrees to proceed.   Edema:   I talked to him previously about keeping his feet up and about salt and fluid.  He will continue the current dose diuretic.  We will adjust based on readings as above.    DM:   His A1C is as above well controlled.  No change in therapy is indicated.   Persistent Atrial Fib:  Paul Mclaughlin has a CHA2DS2 - VASc score of 2.   He remains on Xarelto.   He can hold this for two days prior to his procedure.    Congenital Heart Disease:   I see no residual of a VSD or other but he might need TEE in the future with  probable right heart cath as above.  Visual disturbance:   He was complaining of this previously but this was related to low blood sugars.     Current medicines are reviewed at length with the patient today.  The patient does not have concerns regarding medicines.  The following changes have been made:  None  Labs/ tests ordered today include:    Orders Placed This Encounter  Procedures  . Basic metabolic panel  . INR/PT  . CBC     Disposition:   FU with me in after the left and right heart cath.    Signed, Minus Breeding, MD  03/16/2017 9:41 PM    [facility]

## 2017-03-30 NOTE — H&P (Signed)
Advanced Heart Failure Team History and Physical Note   Primary Physician:  Dr. Venetia Night Primary Cardiologist:  Dr. Antoine Poche Primary HF: New (Dr. Shirlee Latch)  Reason for Admission: Pulmonary venous hypertension, markedly elevated filling pressures on cath.  HPI:    Paul Mclaughlin is a 61 y.o. male with PMH of atrial fibrillation, Diastolic CHF, Congenital heart disease s/p repair at Age 64, and DM2.   Seen in Dr. Jenene Slicker clinic 03/16/17 for follow up. He was scheduled for L/RHC at this visit to further evaluate with concerns for Type 1 Pulm HTN.  Admitted from cath lab today with markedly elevated filling pressures, severe pulmonary hypertension, predominantly pulmonary venous HTN with PVR 3.1 WU, and mild CAD, with most significant coronary lesion 75% distal LAD stenosis.   Pt feeling OK currently. He states he was taking his medications as directed at home, but has struggled with peripheral edema for a long time. Tries to avoid going to the doctor.  He has never been told he has lung problems. No history of CA, radiation, or chemotherapy.  Never smoker. Previously an occasional THC user. No heavy drinking. 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 Lots of dust in the air. Snores heavily. No history of sleep study or PFTs. Previously weight upwards of 400 lbs. Lost weight by dieting.   Echo 11/28/16 LVEF 55-60%, Mod/Sev LAE, Severe RV dilation with severely reduced function, Severe RAE, Mild/Mod TR directed centrally, PA peak pressure 63 mm Hg.  [facility] 03/30/17 Procedural Findings: Hemodynamics (mmHg) RA mean 23 RV 90/23 PA 94/35, mean 56 PCWP mean 31 AO 151/86 Cardiac Output (Fick) 8.03 Cardiac Index (Fick) 3.09 PVR 3.1 WU   Oxygen saturations RA 66% RV 65% PA 61% AO 98%    Review of systems complete and found to be negative unless listed in HPI.    Home Medications Prior to Admission medications   Medication Sig Start Date End Date Taking? Authorizing Provider  albuterol  (PROVENTIL HFA;VENTOLIN HFA) 108 (90 Base) MCG/ACT inhaler Inhale 2 puffs into the lungs every 6 (six) hours as needed for wheezing or shortness of breath. 09/07/16  Yes Jaclyn Shaggy, MD  atorvastatin (LIPITOR) 20 MG tablet Take 1 tablet (20 mg total) by mouth daily. 02/08/17  Yes Jaclyn Shaggy, MD  furosemide (LASIX) 40 MG tablet Take 1 tablet (40 mg total) by mouth 2 (two) times daily. 01/09/17  Yes Kilroy, Luke K, PA-C  gabapentin (NEURONTIN) 300 MG capsule TAKE 1 CAPSULE BY MOUTH 2 TIMES DAILY. 02/08/17  Yes Jaclyn Shaggy, MD  glipiZIDE (GLUCOTROL) 5 MG tablet Take 0.5 tablets (2.5 mg total) by mouth 2 (two) times daily before a meal. 02/08/17  Yes Amao, Enobong, MD  hydrALAZINE (APRESOLINE) 50 MG tablet Take 1 tablet (50 mg total) by mouth 3 (three) times daily. 12/09/16  Yes Rollene Rotunda, MD  lisinopril (PRINIVIL,ZESTRIL) 40 MG tablet Take 1 tablet (40 mg total) by mouth daily. 02/08/17  Yes Jaclyn Shaggy, MD  metFORMIN (GLUCOPHAGE) 500 MG tablet TAKE 1 TABLET BY MOUTH 2 TIMES DAILY WITH A MEAL. 02/08/17  Yes Jaclyn Shaggy, MD  metoprolol tartrate (LOPRESSOR) 50 MG tablet Take 1 tablet (50 mg total) by mouth 2 (two) times daily. 02/08/17  Yes Jaclyn Shaggy, MD  nitroGLYCERIN (NITRODUR - DOSED IN MG/24 HR) 0.2 mg/hr patch Place 1 patch (0.2 mg total) onto the skin daily. Change the patch location daily applied to top of foot. 10/26/16  Yes Adonis Huguenin, NP  Potassium Chloride ER 20 MEQ TBCR  TAKE 1 TABLET BY MOUTH DAILY. 03/23/17  Yes Jaclyn Shaggy, MD  potassium chloride SA (K-DUR,KLOR-CON) 20 MEQ tablet Take 1 tablet (20 mEq total) by mouth daily. 02/08/17  Yes Jaclyn Shaggy, MD  traZODone (DESYREL) 100 MG tablet Take 1 tablet (100 mg total) by mouth at bedtime as needed for sleep. 11/24/16  Yes Jaclyn Shaggy, MD  rivaroxaban (XARELTO) 20 MG TABS tablet Take 1 tablet (20 mg total) by mouth daily with supper. 07/13/16   Jaclyn Shaggy, MD    Past Medical History: Past Medical History:  Diagnosis Date    . Cellulitis and abscess of left leg 06/2016  . Diabetes [facility])     Past Surgical History: Past Surgical History:  Procedure Laterality Date  . AMPUTATION Left 06/24/2016   Procedure: FOOT FIFTH RAY TOE AMPUTATION;  Surgeon: [name], MD;  Location: [facility] OR;  Service: Orthopedics;  Laterality: Left;  . open heart surgery     As a child.  [age] years old.  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     Family History:  Family History  Problem Relation Age of Onset  . Diabetes Mellitus II Mother        ESRD  . Diabetes Mellitus II Brother   . Emphysema Father     Social History: Social History   Social History  . Marital status: Divorced    Spouse name: N/A  . Number of children: 1  . Years of education: N/A   Social History Main Topics  . Smoking status: Never Smoker  . Smokeless tobacco: Never Used  . Alcohol use No     Comment: gave up drinking ~20 years ago  . Drug use: No     Comment: not since the 1st of January  . Sexual activity: Not on file   Other Topics Concern  . Not on file   Social History Narrative   Lives alone     Allergies:  No Known Allergies  Objective:    Vital Signs:   Temp:  [97.7 F (36.5 C)-98.7 F (37.1 C)] 98.7 F (37.1 C) (07/19 1430) Pulse Rate:  [60-252] 60 (07/19 1357) Resp:  [9-36] 27 (07/19 1357) BP: (133-180)/(66-91) 171/81 (07/19 1357) SpO2:  [96 %-100 %] 98 % (07/19 1357) Weight:  [310 lb (140.6 kg)] 310 lb (140.6 kg) (07/19 1021)   Filed Weights   03/30/17 1021  Weight: (!) 310 lb (140.6 kg)     Physical Exam     General:  Appears older than stated age. NAD at rest.  HEENT: Normal, Nodule on L forehead Neck: Supple. JVP difficult with large beard. Appears elevated. Carotids 2+ bilat; no bruits. No lymphadenopathy or thyromegaly appreciated. Cor: PMI nondisplaced. Regular rate & rhythm. No rubs, gallops or murmurs. Lungs: Diminished basilar.  Abdomen: Obese, soft, nontender, distended. No hepatosplenomegaly. No bruits or masses. Good  bowel sounds. Extremities: No cyanosis or clubbing. Chronic venous stasis. 1-2 chronic edema Neuro: Alert & oriented x 3, cranial nerves grossly intact. moves all 4 extremities w/o difficulty. Affect pleasant.  Telemetry   Personally reviewed, Afib 70s  EKG   EKG from today personally reviewed, A fib 70s  Labs     Basic Metabolic Panel:  Recent Labs Lab 03/30/17 1102  NA 135  K 4.4  CL 102  CO2 25  GLUCOSE 128*  BUN 28*  CREATININE 1.12  CALCIUM 9.0    Liver Function Tests: No results for input(s): AST, ALT, ALKPHOS, BILITOT, PROT, ALBUMIN in the last 168 hours. No results  for input(s): LIPASE, AMYLASE in the last 168 hours. No results for input(s): AMMONIA in the last 168 hours.  CBC:  Recent Labs Lab 03/30/17 1102  WBC 8.3  HGB 9.9*  HCT 30.6*  MCV 97.1  PLT 224    Cardiac Enzymes: No results for input(s): CKTOTAL, CKMB, CKMBINDEX, TROPONINI in the last 168 hours.  BNP: BNP (last 3 results)  Recent Labs  11/09/16 1626 11/09/16 1754 11/24/16 1150  BNP 383.3* 328.3* 301.0*    ProBNP (last 3 results) No results for input(s): PROBNP in the last 8760 hours.   CBG:  Recent Labs Lab 03/30/17 1018  GLUCAP 126*    Coagulation Studies:  Recent Labs  03/30/17 1102  LABPROT 15.8*  INR 1.25    Imaging:  No results found.   Patient Profile   Paul Mclaughlin is a 61 y.o. male with PMH of atrial fibrillation, Diastolic CHF, Congenital heart disease s/p repair at Age 89, and DM2.   Admitted from cath lab 03/30/17 with markedly elevated filling pressures and primarily pulmonary  Assessment/Plan   1. PAH, primarily pulmonary venous hypertension - WHO Group 2 PAH.  - Severe pulmonary HTN with predominantly PVH on cath. PVR 3.1 WU. PA peak pressure 94 with mean of 56. Wedge 31. - Diurese as below - Pt has PFTs ordered in chart, but have not been performed. Will need PFTs once he is better diuresed, probably as outpatient. - No history of  sleep study. Will need as outpatient.   2. Acute on chronic diastolic HF - Filling pressures markedly elevated, and volume status up on exam. - Start diuresis with 80 mg IV BID. Follow response.  3. Moderate non-obstructive CAD - by cath today most significant coronary lesion 75% distal LAD stenosis.  - Medical management.   - Continue atorvastatin 20 mg. Consider increase.  4. Persistent Afib - EKG today shows Afib in 70s - Resume Xarelto in am now he is post cath.   5. Congenital Heart Disease - Underwent surgical repair at age 89. He is not sure what type of cardiomyopathy he had. 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   6. DM2 - Recent A1C has shown adequate control.  - SSI while in house.   7. HTN - Resume home meds.   Graciella FreerMichael Andrew Tillery, PA-C 03/30/2017, 2:30 PM  Advanced Heart Failure Team Pager [phone] (M-F; 7a - 4p)  Please contact [facility] for night-coverage after hours (4p -7a ) and weekends on [email or web address]  Patient seen with PA, agree with the above note.  The patient has diastolic CHF with prominent RV dysfunction by echo.  RHC is suggestive of biventricular failure with markedly elevated PCWP and RA pressure.  He has severe pulmonary hypertension but based on cath this appears to be primarily pulmonary venous hypertension.  He does not have underlying lung disease that we know of.  With marked volume overload, I admitted him from the cath lab.  - Lasix 80 mg IV bid, will follow response.  May need to increase Lasix or use metolazone.  - Will need PFTs when better-diuresed.  Should also get outpatient sleep study.   Coronary angiography showed moderate nonobstructive CAD.  Continue statin, no ASA given Xarelto use.   Will restart home diabetes regimen but hold metformin given contrast today.   BP is high currently but he has not had his home meds.  Will restart home BP meds.   Marca AnconaDalton Rexine Gowens 03/30/2017 4:38 PM

## 2017-03-30 NOTE — Interval H&P Note (Signed)
History and Physical Interval Note:  03/30/2017 1:00 PM  Paul GasmanKenneth W Hoxworth  has presented today for surgery, with the diagnosis of hp  The various methods of treatment have been discussed with the patient and family. After consideration of risks, benefits and other options for treatment, the patient has consented to  Procedure(s): Right/Left Heart Cath and Coronary Angiography (N/A) as a surgical intervention .  The patient's history has been reviewed, patient examined, no change in status, stable for surgery.  I have reviewed the patient's chart and labs.  Questions were answered to the patient's satisfaction.     Dalton [employer] Energy[employer]Lean

## 2017-03-31 ENCOUNTER — Encounter (HOSPITAL_COMMUNITY): Payer: Self-pay | Admitting: General Practice

## 2017-03-31 DIAGNOSIS — I509 Heart failure, unspecified: Secondary | ICD-10-CM

## 2017-03-31 LAB — URINALYSIS, ROUTINE W REFLEX MICROSCOPIC
BACTERIA UA: NONE SEEN
Bilirubin Urine: NEGATIVE
Glucose, UA: NEGATIVE mg/dL
Ketones, ur: NEGATIVE mg/dL
Leukocytes, UA: NEGATIVE
Nitrite: NEGATIVE
PROTEIN: 100 mg/dL — AB
SPECIFIC GRAVITY, URINE: 1.008 (ref 1.005–1.030)
SQUAMOUS EPITHELIAL / LPF: NONE SEEN
pH: 6 (ref 5.0–8.0)

## 2017-03-31 LAB — CBC WITH DIFFERENTIAL/PLATELET
Basophils Absolute: 0 10*3/uL (ref 0.0–0.1)
Basophils Relative: 0 %
EOS ABS: 0.1 10*3/uL (ref 0.0–0.7)
EOS PCT: 1 %
HCT: 30.9 % — ABNORMAL LOW (ref 39.0–52.0)
Hemoglobin: 9.8 g/dL — ABNORMAL LOW (ref 13.0–17.0)
LYMPHS ABS: 0.4 10*3/uL — AB (ref 0.7–4.0)
LYMPHS PCT: 4 %
MCH: 30.6 pg (ref 26.0–34.0)
MCHC: 31.7 g/dL (ref 30.0–36.0)
MCV: 96.6 fL (ref 78.0–100.0)
MONO ABS: 0.8 10*3/uL (ref 0.1–1.0)
Monocytes Relative: 8 %
Neutro Abs: 9.1 10*3/uL — ABNORMAL HIGH (ref 1.7–7.7)
Neutrophils Relative %: 87 %
PLATELETS: 236 10*3/uL (ref 150–400)
RBC: 3.2 MIL/uL — ABNORMAL LOW (ref 4.22–5.81)
RDW: 14.2 % (ref 11.5–15.5)
WBC: 10.4 10*3/uL (ref 4.0–10.5)

## 2017-03-31 LAB — BASIC METABOLIC PANEL
Anion gap: 11 (ref 5–15)
BUN: 25 mg/dL — AB (ref 6–20)
CO2: 24 mmol/L (ref 22–32)
CREATININE: 1.25 mg/dL — AB (ref 0.61–1.24)
Calcium: 9.1 mg/dL (ref 8.9–10.3)
Chloride: 99 mmol/L — ABNORMAL LOW (ref 101–111)
GFR calc Af Amer: >60 mL/min (ref >60)
GLUCOSE: 147 mg/dL — AB (ref 65–99)
POTASSIUM: 4.4 mmol/L (ref 3.5–5.1)
SODIUM: 134 mmol/L — AB (ref 135–145)

## 2017-03-31 LAB — GLUCOSE, CAPILLARY
GLUCOSE-CAPILLARY: 106 mg/dL — AB (ref 65–99)
GLUCOSE-CAPILLARY: 132 mg/dL — AB (ref 65–99)
Glucose-Capillary: 155 mg/dL — ABNORMAL HIGH (ref 65–99)
Glucose-Capillary: 124 mg/dL — ABNORMAL HIGH (ref 65–99)

## 2017-03-31 LAB — MAGNESIUM: MAGNESIUM: 2 mg/dL (ref 1.7–2.4)

## 2017-03-31 MED ORDER — FUROSEMIDE 10 MG/ML IJ SOLN
80.0000 mg | Freq: Three times a day (TID) | INTRAMUSCULAR | Status: DC
  Administered 2017-03-31 – 2017-04-02 (×8): 80 mg via INTRAVENOUS
  Filled 2017-03-31 (×8): qty 8

## 2017-03-31 MED ORDER — HYDRALAZINE HCL 50 MG PO TABS
75.0000 mg | ORAL_TABLET | Freq: Three times a day (TID) | ORAL | Status: DC
  Administered 2017-03-31 – 2017-04-04 (×13): 75 mg via ORAL
  Filled 2017-03-31 (×13): qty 1

## 2017-03-31 NOTE — Plan of Care (Signed)
Problem: Pain Managment: Goal: General experience of comfort will improve Outcome: Progressing Denies c/o pain or discomfort.   

## 2017-03-31 NOTE — Care Management Note (Signed)
Case Management Note  Patient Details  Name: Agapito GamesKenneth W Poncedeleon MRN: [id number] Date of Birth: 09/30/1955  Subjective/Objective:  Pt presented for Acute Diastolic CHF. Pt initiated on IV Lasix. Pt is from home alone and he still drives himself to appointments. [facility] Dr. Venetia NightAmao.  [facility] and medications will range from $4.00-$10.00.  [facility]- pt is declining services at this time.               Action/Plan: CM did speak with pt about weighing self daily- pt does not have a scale at home. CM will provide pt with a scale via the Heart Failure Clinic. Staff RN to provide pt with Heart Failure Packet and additional education. No further needs from CM at this time.   Expected Discharge Date:                  Expected Discharge Plan:  Home/Self Care  In-House Referral:  NA  Discharge planning Services  CM Consult  Post Acute Care Choice:  NA Choice offered to:  NA  DME Arranged:  N/A DME Agency:  NA  HH Arranged:  NA HH Agency:  NA  Status of Service:  Completed, signed off  If discussed at Long Length of Stay Meetings, dates discussed:    Additional Comments:  Gala LewandowskyGraves-Bigelow, Sherry Rogus Kaye, RN 03/31/2017, 11:17 AM

## 2017-03-31 NOTE — Progress Notes (Addendum)
Advanced Heart Failure Rounding Note  PCP: Dr Venetia NightAmao Primary Cardiologist: Dr. Antoine PocheHochrein HF: Dr. Shirlee LatchMcLean   Subjective:    Tmax 100.3. UA ordered. CXR yesterday with component of pulmonary venous congestion and edema.  Tired this morning. Didn't sleep well. Not very active. No SOB at rest. No CP. No fever, chills, or cough. Denies tachypalpitations.  Negative 1.5 L and weight down 2 lbs.   LHC/RHC Coronary Findings   Dominance: Right  Left Main  25% distal left main stenosis.  Left Anterior Descending  30% proximal LAD stenosis. 75% distal LAD stenosis (small caliber vessel at this point).  Left Circumflex  50% ostial stenosis small OM2. Luminal irregularities diffusely.  Right Coronary Artery  Luminal irregularities.  Right Heart   Right Heart Pressures RHC Procedural Findings: Hemodynamics (mmHg) RA mean 23 RV 90/23 PA 94/35, mean 56 PCWP mean 31 LV 155/31 AO 151/86  Oxygen saturations: RA 66% RV 65% PA 61% AO 98%  Cardiac Output (Fick) 8.03  Cardiac Index (Fick) 3.09 PVR 3.1 WU       Objective:   Weight Range: (!) 308 lb 1.6 oz (139.8 kg) Body mass index is 40.65 kg/m.   Vital Signs:   Temp:  [98.7 F (37.1 C)-100.3 F (37.9 C)] 100.3 F (37.9 C) (07/20 [id number]) Pulse Rate:  [60-252] 106 (07/20 0800) Resp:  [9-36] 21 (07/20 0611) BP: (133-180)/(66-119) 171/81 (07/20 0611) SpO2:  [93 %-100 %] 94 % (07/20 0800) Weight:  [308 lb 1.6 oz (139.8 kg)] 308 lb 1.6 oz (139.8 kg) (07/20 0611)   Weight change: Filed Weights   03/30/17 1021 03/31/17 0611  Weight: (!) 310 lb (140.6 kg) (!) 308 lb 1.6 oz (139.8 kg)   Intake/Output:   Intake/Output Summary (Last 24 hours) at 03/31/17 1104 Last data filed at 03/31/17 0958  Gross per 24 hour  Intake              960 ml  Output             3000 ml  Net            -2040 ml    Physical Exam   General:  Well appearing. No resp difficulty HEENT: Normal Neck: Supple. JVP elevated 14+ cm. Carotids 2+  bilat; no bruits. No lymphadenopathy or thyromegaly appreciated. Cor: PMI nondisplaced. Irregularly irregular. No rubs, gallops or murmurs. Lungs: Diminished basilar.  Abdomen: Obese, soft, nontender, nondistended. No hepatosplenomegaly. No bruits or masses. Good bowel sounds. Extremities: No cyanosis, clubbing, or rash. 1-2 chronic edema.  Neuro: Alert & orientedx3, cranial nerves grossly intact. moves all 4 extremities w/o difficulty. Affect pleasant  Telemetry   Personally reviewed, Afib 70-80s currently. Rate up to 110-120s  EKG    EKG 03/30/17 reviewed, Afib 70s  Labs    CBC  Recent Labs  03/30/17 1102 03/31/17 0330  WBC 8.3 10.4  NEUTROABS  --  9.1*  HGB 9.9* 9.8*  HCT 30.6* 30.9*  MCV 97.1 96.6  PLT 224 236   Basic Metabolic Panel  Recent Labs  03/30/17 1102 03/31/17 0330  NA 135 134*  K 4.4 4.4  CL 102 99*  CO2 25 24  GLUCOSE 128* 147*  BUN 28* 25*  CREATININE 1.12 1.25*  CALCIUM 9.0 9.1  MG  --  2.0   Liver Function Tests No results for input(s): AST, ALT, ALKPHOS, BILITOT, PROT, ALBUMIN in the last 72 hours. No results for input(s): LIPASE, AMYLASE in the last 72 hours. Cardiac Enzymes No  results for input(s): CKTOTAL, CKMB, CKMBINDEX, TROPONINI in the last 72 hours.  BNP: BNP (last 3 results)  Recent Labs  11/09/16 1626 11/09/16 1754 11/24/16 1150  BNP 383.3* 328.3* 301.0*    ProBNP (last 3 results) No results for input(s): PROBNP in the last 8760 hours.   D-Dimer No results for input(s): DDIMER in the last 72 hours. Hemoglobin A1C No results for input(s): HGBA1C in the last 72 hours. Fasting Lipid Panel No results for input(s): CHOL, HDL, LDLCALC, TRIG, CHOLHDL, LDLDIRECT in the last 72 hours. Thyroid Function Tests No results for input(s): TSH, T4TOTAL, T3FREE, THYROIDAB in the last 72 hours.  Invalid input(s): FREET3  Other results:   Imaging    Dg Chest Port 1 View  Result Date: 03/31/2017 CLINICAL DATA:  Pulmonary  venous hypertension. EXAM: PORTABLE CHEST 1 VIEW COMPARISON:  11/01/2016. FINDINGS: Cardiomegaly. Prominent central pulmonary arteries, a noted. Pulmonic valve disease could present in this fashion. Pulmonary hypertension could also present fashion. A component of pulmonary venous congestion interstitial prominence noted. CHF cannot be excluded. Small right pleural effusion cannot be excluded. Right costophrenic angle incompletely imaged. No pneumothorax . IMPRESSION: 1. Cardiomegaly with prominent central pulmonary artery particular on the left. Pulmonic valve disease could present this fashion. Pulmonary hypertension could also present this fashion. 2. A component of pulmonary venous congestion and mild interstitial prominence noted suggesting CHF. Small right pleural effusion cannot be excluded. Electronically Signed   By: Maisie Fus  Register   On: 03/31/2017 06:26      Medications:     Scheduled Medications: . atorvastatin  20 mg Oral q1800  . furosemide  80 mg Intravenous BID  . glipiZIDE  2.5 mg Oral BID AC  . hydrALAZINE  50 mg Oral Q8H  . insulin aspart  0-15 Units Subcutaneous TID WC  . insulin aspart  0-5 Units Subcutaneous QHS  . lisinopril  40 mg Oral Daily  . metoprolol tartrate  50 mg Oral BID  . potassium chloride  20 mEq Oral Daily  . rivaroxaban  20 mg Oral Q supper  . sodium chloride flush  3 mL Intravenous Q12H     Infusions: . sodium chloride       PRN Medications:  sodium chloride, acetaminophen, ondansetron (ZOFRAN) IV, sodium chloride flush    Patient Profile   CAETANO OBERHAUS is a 61 y.o. 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   Admitted from cath lab 03/30/17 with markedly elevated filling pressures and primarily pulmonary  Assessment/Plan   1. 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  - Severe pulmonary HTN with predominantly PVH on cath. PVR 3.1 WU. PA peak  pressure 94 with mean of 56. Wedge 31. - Increase lasix to 80 mg IV TID. Only modest response overnight. Diurese as below - Pt has PFTs ordered in chart, but have not been performed. Will need PFTs once he is better diuresed.  - No history of sleep study. Will need as outpatient. No change.   2. Acute on chronic diastolic HF: Echo in 3/18 with LV EF 60-65%, RV severely dilated with moderate to severely decreased systolic function.  - Filling pressures markedly elevated, and volume status up on exam. - Increasing diuresis as above. Follow kidney function.   3. Moderate non-obstructive CAD - by cath today most significant coronary lesion 75% distal LAD stenosis.  - Medical management.   - Continue atorvastatin 20 mg. No change.  4. Persistent Afib - EKG 03/30/17 shows Afib in 70s - Resume Xarelto this am now he is post cath.   5. Congenital Heart Disease - Underwent surgical repair at age 3. He is not sure what type of cardiomyopathy he had. 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  - No step up in oxygen saturation on RHC to suggest significant residual VSD.   6. DM2 - Recent A1C has shown adequate control.  - SSI while in house. Continue to follow  7. HTN -  Increase hydralazine to 75 mg TID - Continue lisinopril 40 mg daily - Continue lopressor 50 mg BID.     Length of Stay: 1  Luane School  03/31/2017, 11:04 AM  Advanced Heart Failure Team Pager [phone] (M-F; 7a - 4p)  Please contact [facility] for night-coverage after hours (4p -7a ) and weekends on [email or web address]  Patient seen with PA, agree with the above note.   The patient has diastolic CHF with prominent RV dysfunction by echo.  RHC is suggestive of biventricular failure with markedly elevated PCWP and RA pressure.  He has severe pulmonary hypertension but based on cath this appears to be primarily pulmonary venous hypertension.  He does not have underlying lung disease that we know  of.  With marked volume overload, I admitted him from the cath lab. He remains significantly volume overloaded.  - Increase Lasix to 80 mg IV every 8 hrs.  - Will need PFTs when better-diuresed.  Should also get outpatient sleep study.  - Place ted hose.   Coronary angiography showed moderate nonobstructive CAD.  Continue statin, no ASA given Xarelto use.   BP remains high at times, increase hydralazine to 75 mg tid.   Marca Ancona 03/31/2017 1:31 PM

## 2017-04-01 LAB — GLUCOSE, CAPILLARY
Glucose-Capillary: 98 mg/dL (ref 65–99)
Glucose-Capillary: 94 mg/dL (ref 65–99)
Glucose-Capillary: 101 mg/dL — ABNORMAL HIGH (ref 65–99)
Glucose-Capillary: 116 mg/dL — ABNORMAL HIGH (ref 65–99)

## 2017-04-01 LAB — BASIC METABOLIC PANEL
Anion gap: 8 (ref 5–15)
BUN: 30 mg/dL — AB (ref 6–20)
CALCIUM: 8.7 mg/dL — AB (ref 8.9–10.3)
CO2: 26 mmol/L (ref 22–32)
Chloride: 98 mmol/L — ABNORMAL LOW (ref 101–111)
Creatinine, Ser: 1.21 mg/dL (ref 0.61–1.24)
GFR calc Af Amer: >60 mL/min (ref >60)
GLUCOSE: 106 mg/dL — AB (ref 65–99)
Potassium: 3.8 mmol/L (ref 3.5–5.1)
Sodium: 132 mmol/L — ABNORMAL LOW (ref 135–145)

## 2017-04-01 LAB — MAGNESIUM: Magnesium: 2 mg/dL (ref 1.7–2.4)

## 2017-04-01 MED ORDER — POTASSIUM CHLORIDE CRYS ER 20 MEQ PO TBCR
20.0000 meq | EXTENDED_RELEASE_TABLET | Freq: Once | ORAL | Status: AC
  Administered 2017-04-01: 20 meq via ORAL
  Filled 2017-04-01: qty 1

## 2017-04-01 MED ORDER — POTASSIUM CHLORIDE CRYS ER 20 MEQ PO TBCR
40.0000 meq | EXTENDED_RELEASE_TABLET | Freq: Every day | ORAL | Status: DC
  Administered 2017-04-02 – 2017-04-04 (×3): 40 meq via ORAL
  Filled 2017-04-01 (×3): qty 2

## 2017-04-01 NOTE — Progress Notes (Signed)
Patient ID: Paul Mclaughlin, male   DOB: 13-Feb-1956, 61 y.o.   MRN: [id number]     Advanced Heart Failure Rounding Note  PCP: Dr Venetia NightAmao Primary Cardiologist: Dr. Antoine PocheHochrein HF: Dr. Shirlee LatchMcLean   Subjective:    Good diuresis yesterday, weight down 9 lbs.  He is breathing better.  BP is controlled.   LHC/RHC Coronary Findings   Dominance: Right  Left Main  25% distal left main stenosis.  Left Anterior Descending  30% proximal LAD stenosis. 75% distal LAD stenosis (small caliber vessel at this point).  Left Circumflex  50% ostial stenosis small OM2. Luminal irregularities diffusely.  Right Coronary Artery  Luminal irregularities.  Right Heart   Right Heart Pressures RHC Procedural Findings: Hemodynamics (mmHg) RA mean 23 RV 90/23 PA 94/35, mean 56 PCWP mean 31 LV 155/31 AO 151/86  Oxygen saturations: RA 66% RV 65% PA 61% AO 98%  Cardiac Output (Fick) 8.03  Cardiac Index (Fick) 3.09 PVR 3.1 WU       Objective:   Weight Range: 299 lb 1.6 oz (135.7 kg) Body mass index is 39.46 kg/m.   Vital Signs:   Temp:  [95.6 F (35.3 C)-100.5 F (38.1 C)] 95.6 F (35.3 C) (07/21 0623) Pulse Rate:  [70-85] 78 (07/21 0854) Resp:  [18-20] 18 (07/21 0623) BP: (122-132)/(62-73) 124/62 (07/21 0623) SpO2:  [95 %-98 %] 96 % (07/21 0623) Weight:  [299 lb 1.6 oz (135.7 kg)] 299 lb 1.6 oz (135.7 kg) (07/21 0623)   Weight change: Filed Weights   03/30/17 1021 03/31/17 0611 04/01/17 0623  Weight: (!) 310 lb (140.6 kg) (!) 308 lb 1.6 oz (139.8 kg) 299 lb 1.6 oz (135.7 kg)   Intake/Output:   Intake/Output Summary (Last 24 hours) at 04/01/17 1154 Last data filed at 04/01/17 1017  Gross per 24 hour  Intake              600 ml  Output             3925 ml  Net            -3325 ml    Physical Exam   General:  Well appearing. No resp difficulty HEENT: Normal Neck: Supple. JVP elevated 14 cm. Carotids 2+ bilat; no bruits. No lymphadenopathy or thyromegaly appreciated. Cor: PMI  nondisplaced. Irregularly irregular. No rubs, gallops or murmurs. Lungs: Diminished basilar.  Abdomen: Obese, soft, nontender, nondistended. No hepatosplenomegaly. No bruits or masses. Good bowel sounds. Extremities: No cyanosis, clubbing, or rash. 1+ edema to knees bilaterally.   Neuro: Alert & orientedx3, cranial nerves grossly intact. moves all 4 extremities w/o difficulty. Affect pleasant  Telemetry   Personally reviewed, afib 60s  EKG    EKG 03/30/17 reviewed, Afib 70s  Labs    CBC  Recent Labs  03/30/17 1102 03/31/17 0330  WBC 8.3 10.4  NEUTROABS  --  9.1*  HGB 9.9* 9.8*  HCT 30.6* 30.9*  MCV 97.1 96.6  PLT 224 236   Basic Metabolic Panel  Recent Labs  03/31/17 0330 04/01/17 0210  NA 134* 132*  K 4.4 3.8  CL 99* 98*  CO2 24 26  GLUCOSE 147* 106*  BUN 25* 30*  CREATININE 1.25* 1.21  CALCIUM 9.1 8.7*  MG 2.0 2.0   Liver Function Tests No results for input(s): AST, ALT, ALKPHOS, BILITOT, PROT, ALBUMIN in the last 72 hours. No results for input(s): LIPASE, AMYLASE in the last 72 hours. Cardiac Enzymes No results for input(s): CKTOTAL, CKMB, CKMBINDEX, TROPONINI in the  last 72 hours.  BNP: BNP (last 3 results)  Recent Labs  11/09/16 1626 11/09/16 1754 11/24/16 1150  BNP 383.3* 328.3* 301.0*    ProBNP (last 3 results) No results for input(s): PROBNP in the last 8760 hours.   D-Dimer No results for input(s): DDIMER in the last 72 hours. Hemoglobin A1C No results for input(s): HGBA1C in the last 72 hours. Fasting Lipid Panel No results for input(s): CHOL, HDL, LDLCALC, TRIG, CHOLHDL, LDLDIRECT in the last 72 hours. Thyroid Function Tests No results for input(s): TSH, T4TOTAL, T3FREE, THYROIDAB in the last 72 hours.  Invalid input(s): FREET3  Other results:   Imaging    No results found.   Medications:     Scheduled Medications: . atorvastatin  20 mg Oral q1800  . furosemide  80 mg Intravenous TID  . glipiZIDE  2.5 mg Oral BID  AC  . hydrALAZINE  75 mg Oral Q8H  . insulin aspart  0-15 Units Subcutaneous TID WC  . insulin aspart  0-5 Units Subcutaneous QHS  . lisinopril  40 mg Oral Daily  . metoprolol tartrate  50 mg Oral BID  . potassium chloride  20 mEq Oral Once  . [START ON 04/02/2017] potassium chloride  40 mEq Oral Daily  . rivaroxaban  20 mg Oral Q supper  . sodium chloride flush  3 mL Intravenous Q12H    Infusions: . sodium chloride      PRN Medications: sodium chloride, acetaminophen, ondansetron (ZOFRAN) IV, sodium chloride flush    Patient Profile   Paul Mclaughlin is a 61 y.o. male with PMH of atrial fibrillation, Diastolic CHF, Congenital heart disease s/p repair at Age 60, and DM2.   Admitted from cath lab 03/30/17 with markedly elevated filling pressures and primarily pulmonary  Assessment/Plan   1. Pulmonary hypertension: Primarily pulmonary venous hypertension, WHO Group 2.  - Pt has PFTs ordered in chart, but have not been performed. Will need PFTs once he is better diuresed.  - No history of sleep study. Will need as outpatient. No change.  2. Acute on chronic diastolic HF: Echo in 3/18 with LV EF 60-65%, RV severely dilated with moderate to severely decreased systolic function. Filling pressures markedly elevated on RHC.  He has been diuresing well but still significantly volume overloaded.  - Continue Lasix 80 mg IV every 8 hrs.  3. Moderate non-obstructive CAD: Most significant coronary lesion by cath this admission was 75% distal LAD stenosis.  - Medical management.   - Continue atorvastatin 20 mg. No change.  4. Chronic Afib: Remains on Xarelto.  5. Congenital Heart Disease:  Underwent surgical repair at age 46. He is not sure what type of congenital issue he had. 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  No step up in oxygen saturation on RHC to suggest significant residual VSD.  6. DM2: Glipizide, SSI.  7. HTN: BP controlled on current regimen.   Length of Stay:  2  Paul Ancona, MD  04/01/2017, 11:54 AM  Advanced Heart Failure Team Pager [phone] (M-F; 7a - 4p)  Please contact [facility] for night-coverage after hours (4p -7a ) and weekends on [email or web address]

## 2017-04-01 NOTE — Discharge Instructions (Addendum)

## 2017-04-02 LAB — GLUCOSE, CAPILLARY
GLUCOSE-CAPILLARY: 103 mg/dL — AB (ref 65–99)
GLUCOSE-CAPILLARY: 159 mg/dL — AB (ref 65–99)
GLUCOSE-CAPILLARY: 83 mg/dL (ref 65–99)
Glucose-Capillary: 89 mg/dL (ref 65–99)

## 2017-04-02 LAB — CBC
HCT: 29.2 % — ABNORMAL LOW (ref 39.0–52.0)
Hemoglobin: 9.6 g/dL — ABNORMAL LOW (ref 13.0–17.0)
MCH: 31 pg (ref 26.0–34.0)
MCHC: 32.9 g/dL (ref 30.0–36.0)
MCV: 94.2 fL (ref 78.0–100.0)
PLATELETS: 218 10*3/uL (ref 150–400)
RBC: 3.1 MIL/uL — AB (ref 4.22–5.81)
RDW: 14.1 % (ref 11.5–15.5)
WBC: 5.8 10*3/uL (ref 4.0–10.5)

## 2017-04-02 LAB — BASIC METABOLIC PANEL
Anion gap: 9 (ref 5–15)
BUN: 29 mg/dL — AB (ref 6–20)
CALCIUM: 9 mg/dL (ref 8.9–10.3)
CHLORIDE: 98 mmol/L — AB (ref 101–111)
CO2: 27 mmol/L (ref 22–32)
CREATININE: 1.18 mg/dL (ref 0.61–1.24)
GFR calc Af Amer: >60 mL/min (ref >60)
GFR calc non Af Amer: >60 mL/min (ref >60)
GLUCOSE: 99 mg/dL (ref 65–99)
Potassium: 3.6 mmol/L (ref 3.5–5.1)
Sodium: 134 mmol/L — ABNORMAL LOW (ref 135–145)

## 2017-04-02 NOTE — Progress Notes (Signed)
NP Su Hiltoberts paged about dropping heart rate 36-37. Awaiting response.

## 2017-04-02 NOTE — Progress Notes (Signed)
Patient ID: Paul Mclaughlin, male   DOB: Jun 26, 1956, 61 y.o.   MRN: [id number]     Advanced Heart Failure Rounding Note  PCP: Dr Venetia Night Primary Cardiologist: Dr. Antoine Poche HF: Dr. Shirlee Latch   Subjective:    Good diuresis again yesterday, weight down 10 lbs.  Breathing gradually improving.  BP is controlled.   LHC/RHC Coronary Findings   Dominance: Right  Left Main  25% distal left main stenosis.  Left Anterior Descending  30% proximal LAD stenosis. 75% distal LAD stenosis (small caliber vessel at this point).  Left Circumflex  50% ostial stenosis small OM2. Luminal irregularities diffusely.  Right Coronary Artery  Luminal irregularities.  Right Heart   Right Heart Pressures RHC Procedural Findings: Hemodynamics (mmHg) RA mean 23 RV 90/23 PA 94/35, mean 56 PCWP mean 31 LV 155/31 AO 151/86  Oxygen saturations: RA 66% RV 65% PA 61% AO 98%  Cardiac Output (Fick) 8.03  Cardiac Index (Fick) 3.09 PVR 3.1 WU       Objective:   Weight Range: 289 lb 14.4 oz (131.5 kg) Body mass index is 38.25 kg/m.   Vital Signs:   Temp:  [97.7 F (36.5 C)-98.6 F (37 C)] 97.9 F (36.6 C) (07/22 0732) Pulse Rate:  [61-69] 69 (07/22 0732) Resp:  [16-20] 16 (07/22 0732) BP: (103-118)/(61-68) 108/61 (07/22 0732) SpO2:  [95 %-100 %] 96 % (07/22 0732) Weight:  [289 lb 14.4 oz (131.5 kg)] 289 lb 14.4 oz (131.5 kg) (07/22 0559) Last BM Date: 03/29/17 Weight change: Filed Weights   03/31/17 0611 04/01/17 0623 04/02/17 0559  Weight: (!) 308 lb 1.6 oz (139.8 kg) 299 lb 1.6 oz (135.7 kg) 289 lb 14.4 oz (131.5 kg)   Intake/Output:   Intake/Output Summary (Last 24 hours) at 04/02/17 1035 Last data filed at 04/02/17 0900  Gross per 24 hour  Intake              700 ml  Output             6115 ml  Net            -5415 ml    Physical Exam   General:  Well appearing. No resp difficulty HEENT: Normal Neck: Supple. JVP elevated 12-14 cm. Carotids 2+ bilat; no bruits. No lymphadenopathy  or thyromegaly appreciated. Cor: PMI nondisplaced. Irregularly irregular. No rubs, gallops or murmurs. Lungs: Diminished basilar.  Abdomen: Obese, soft, nontender, nondistended. No hepatosplenomegaly. No bruits or masses. Good bowel sounds. Extremities: No cyanosis, clubbing, or rash. 1-2+ edema to knees bilaterally  Neuro: Alert & orientedx3, cranial nerves grossly intact. moves all 4 extremities w/o difficulty. Affect pleasant  Telemetry   Personally reviewed, afib 60s  EKG    EKG 03/30/17 reviewed, Afib 70s  Labs    CBC  Recent Labs  03/31/17 0330 04/02/17 0610  WBC 10.4 5.8  NEUTROABS 9.1*  --   HGB 9.8* 9.6*  HCT 30.9* 29.2*  MCV 96.6 94.2  PLT 236 218   Basic Metabolic Panel  Recent Labs  03/31/17 0330 04/01/17 0210 04/02/17 0610  NA 134* 132* 134*  K 4.4 3.8 3.6  CL 99* 98* 98*  CO2 24 26 27   GLUCOSE 147* 106* 99  BUN 25* 30* 29*  CREATININE 1.25* 1.21 1.18  CALCIUM 9.1 8.7* 9.0  MG 2.0 2.0  --    Liver Function Tests No results for input(s): AST, ALT, ALKPHOS, BILITOT, PROT, ALBUMIN in the last 72 hours. No results for input(s): LIPASE, AMYLASE in the  last 72 hours. Cardiac Enzymes No results for input(s): CKTOTAL, CKMB, CKMBINDEX, TROPONINI in the last 72 hours.  BNP: BNP (last 3 results)  Recent Labs  11/09/16 1626 11/09/16 1754 11/24/16 1150  BNP 383.3* 328.3* 301.0*    ProBNP (last 3 results) No results for input(s): PROBNP in the last 8760 hours.   D-Dimer No results for input(s): DDIMER in the last 72 hours. Hemoglobin A1C No results for input(s): HGBA1C in the last 72 hours. Fasting Lipid Panel No results for input(s): CHOL, HDL, LDLCALC, TRIG, CHOLHDL, LDLDIRECT in the last 72 hours. Thyroid Function Tests No results for input(s): TSH, T4TOTAL, T3FREE, THYROIDAB in the last 72 hours.  Invalid input(s): FREET3  Other results:   Imaging    No results found.   Medications:     Scheduled Medications: .  atorvastatin  20 mg Oral q1800  . furosemide  80 mg Intravenous TID  . glipiZIDE  2.5 mg Oral BID AC  . hydrALAZINE  75 mg Oral Q8H  . insulin aspart  0-15 Units Subcutaneous TID WC  . insulin aspart  0-5 Units Subcutaneous QHS  . lisinopril  40 mg Oral Daily  . metoprolol tartrate  50 mg Oral BID  . potassium chloride  40 mEq Oral Daily  . rivaroxaban  20 mg Oral Q supper  . sodium chloride flush  3 mL Intravenous Q12H    Infusions: . sodium chloride      PRN Medications: sodium chloride, acetaminophen, ondansetron (ZOFRAN) IV, sodium chloride flush    Patient Profile   Agapito GamesKenneth W Mclaughlin is a 61 y.o. male with PMH of atrial fibrillation, Diastolic CHF, Congenital heart disease s/p repair at Age 89, and DM2.   Admitted from cath lab 03/30/17 with markedly elevated filling pressures and primarily pulmonary  Assessment/Plan   1. Pulmonary hypertension: Primarily pulmonary venous hypertension, WHO Group 2.  - Will need PFTs once he is better diuresed.  - No history of sleep study. Will need as outpatient. No change.  2. Acute on chronic diastolic HF: Echo in 3/18 with LV EF 60-65%, RV severely dilated with moderate to severely decreased systolic function. Filling pressures markedly elevated on RHC.  Good diuresis yesterday but still has a lot of excess volume.   - Continue Lasix 80 mg IV every 8 hrs, replace K.  3. Moderate non-obstructive CAD: Most significant coronary lesion by cath this admission was 75% distal LAD stenosis.  - Medical management.   - Continue atorvastatin 20 mg. No change.  4. Chronic Afib: Remains on Xarelto.  5. Congenital Heart Disease:  Underwent surgical repair at age 89. He is not sure what type of congenital issue he had. 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  No step up in oxygen saturation on RHC to suggest significant residual VSD.  6. DM2: Glipizide, SSI.  7. HTN: BP controlled on current regimen.   Length of Stay: 3  Marca Anconaalton  Frantz Quattrone, MD  04/02/2017, 10:35 AM  Advanced Heart Failure Team Pager [phone] (M-F; 7a - 4p)  Please contact [facility] for night-coverage after hours (4p -7a ) and weekends on [email or web address]

## 2017-04-03 LAB — BASIC METABOLIC PANEL
ANION GAP: 8 (ref 5–15)
BUN: 31 mg/dL — ABNORMAL HIGH (ref 6–20)
CALCIUM: 8.7 mg/dL — AB (ref 8.9–10.3)
CO2: 29 mmol/L (ref 22–32)
CREATININE: 1.17 mg/dL (ref 0.61–1.24)
Chloride: 97 mmol/L — ABNORMAL LOW (ref 101–111)
Glucose, Bld: 96 mg/dL (ref 65–99)
Potassium: 3.7 mmol/L (ref 3.5–5.1)
SODIUM: 134 mmol/L — AB (ref 135–145)

## 2017-04-03 LAB — CBC
HCT: 27.1 % — ABNORMAL LOW (ref 39.0–52.0)
HEMOGLOBIN: 9 g/dL — AB (ref 13.0–17.0)
MCH: 31 pg (ref 26.0–34.0)
MCHC: 33.2 g/dL (ref 30.0–36.0)
MCV: 93.4 fL (ref 78.0–100.0)
PLATELETS: 232 10*3/uL (ref 150–400)
RBC: 2.9 MIL/uL — AB (ref 4.22–5.81)
RDW: 14 % (ref 11.5–15.5)
WBC: 6 10*3/uL (ref 4.0–10.5)

## 2017-04-03 LAB — GLUCOSE, CAPILLARY
GLUCOSE-CAPILLARY: 99 mg/dL (ref 65–99)
GLUCOSE-CAPILLARY: 100 mg/dL — AB (ref 65–99)
GLUCOSE-CAPILLARY: 95 mg/dL (ref 65–99)
Glucose-Capillary: 91 mg/dL (ref 65–99)

## 2017-04-03 MED ORDER — METOPROLOL TARTRATE 25 MG PO TABS
25.0000 mg | ORAL_TABLET | Freq: Two times a day (BID) | ORAL | Status: DC
  Administered 2017-04-03 – 2017-04-04 (×2): 25 mg via ORAL
  Filled 2017-04-03 (×2): qty 1

## 2017-04-03 MED ORDER — FUROSEMIDE 40 MG PO TABS: 40.0000 mg | ORAL_TABLET | Freq: Two times a day (BID) | ORAL | Status: DC

## 2017-04-03 MED ORDER — FUROSEMIDE 10 MG/ML IJ SOLN
80.0000 mg | Freq: Once | INTRAMUSCULAR | Status: AC
  Administered 2017-04-03: 80 mg via INTRAVENOUS
  Filled 2017-04-03: qty 8

## 2017-04-03 MED ORDER — TORSEMIDE 20 MG PO TABS
60.0000 mg | ORAL_TABLET | Freq: Every day | ORAL | Status: DC
  Administered 2017-04-04: 60 mg via ORAL
  Filled 2017-04-03: qty 3

## 2017-04-03 MED ORDER — FUROSEMIDE 10 MG/ML IJ SOLN
80.0000 mg | Freq: Once | INTRAMUSCULAR | Status: AC
Start: 2017-04-03 — End: 2017-04-03
  Administered 2017-04-03: 80 mg via INTRAVENOUS
  Filled 2017-04-03: qty 8

## 2017-04-03 NOTE — Progress Notes (Signed)
CARDIAC REHAB PHASE I   PRE:  Rate/Rhythm: 64-73 afib  BP:  Supine: 118/73  Sitting:   Standing:    SaO2: 96%RA  MODE:  Ambulation: 500 ft   POST:  Rate/Rhythm: 96 afib  BP:  Supine:   Sitting: 135/74  Standing:    SaO2: 95%RA 1030-1114 Pt walked 500 ft on RA with steady gait. Tolerated well. No DOE noted. Reviewed CHF booklet and importance of daily weights, 2000 mg sodium restriction and 2L FR. Gave pt low sodium diets. Has scales in room. Gave ex ed as tolerated. Discussed when to call MD with signs/symptoms of CHF.   Luetta Nuttingharlene Tomothy Eddins, RN BSN  04/03/2017 11:09 AM

## 2017-04-03 NOTE — Progress Notes (Addendum)
Patient ID: Paul Mclaughlin, male   DOB: [birth year], 61 y.o.   MRN: [id number]     Advanced Heart Failure Rounding Note  PCP: Dr Venetia NightAmao Primary Cardiologist: Dr. Antoine PocheHochrein HF: Dr. Shirlee LatchMcLean   Subjective:    Admitted 03/30/17 with acute on chronic diastolic CHF, PAH. Weight down 30 pounds, down 9 pounds from yesterday. Feels well today, denies SOB. Has walked in his room without SOB.   LHC/RHC Coronary Findings   Dominance: Right  Left Main  25% distal left main stenosis.  Left Anterior Descending  30% proximal LAD stenosis. 75% distal LAD stenosis (small caliber vessel at this point).  Left Circumflex  50% ostial stenosis small OM2. Luminal irregularities diffusely.  Right Coronary Artery  Luminal irregularities.  Right Heart   Right Heart Pressures RHC Procedural Findings: Hemodynamics (mmHg) RA mean 23 RV 90/23 PA 94/35, mean 56 PCWP mean 31 LV 155/31 AO 151/86  Oxygen saturations: RA 66% RV 65% PA 61% AO 98%  Cardiac Output (Fick) 8.03  Cardiac Index (Fick) 3.09 PVR 3.1 WU       Objective:   Weight Range: 280 lb 12.8 oz (127.4 kg) Body mass index is 37.05 kg/m.   Vital Signs:   Temp:  [97.5 F (36.4 C)-97.9 F (36.6 C)] 97.9 F (36.6 C) (07/23 0412) Pulse Rate:  [56-69] 64 (07/23 0412) Resp:  [16-18] 18 (07/22 1513) BP: (108-124)/(49-68) 116/49 (07/23 0412) SpO2:  [94 %-98 %] 94 % (07/23 0412) Weight:  [280 lb 12.8 oz (127.4 kg)] 280 lb 12.8 oz (127.4 kg) (07/23 0412) Last BM Date: 04/02/17 Weight change: Filed Weights   04/01/17 0623 04/02/17 0559 04/03/17 0412  Weight: 299 lb 1.6 oz (135.7 kg) 289 lb 14.4 oz (131.5 kg) 280 lb 12.8 oz (127.4 kg)   Intake/Output:   Intake/Output Summary (Last 24 hours) at 04/03/17 0729 Last data filed at 04/03/17 0415  Gross per 24 hour  Intake                0 ml  Output             4840 ml  Net            -4840 ml    Physical Exam   General: Fatigued appearing male. NAD.  HEENT: Normal Neck: Supple. JVP  10-12 cm.  Carotids 2+ bilat; no bruits. No lymphadenopathy or thyromegaly appreciated. Cor: PMI nondisplaced. Irregularly irregular.  No rubs, gallops or murmurs. Lungs: Clear in all lobes. Normal effort. Abdomen: Obese, soft, nontender,+ distended. No hepatosplenomegaly. No bruits or masses.Bowel sounds present.  Extremities: No cyanosis, clubbing, or rash. 1+ pedal edema.  Neuro: A&O x 3.  cranial nerves grossly intact. moves all 4 extremities w/o difficulty. Affect pleasant  Telemetry   Afib, rates 60-70's. Bradycardia noted yesterday, with 2.5 second pauses - personally reviewed.  EKG    EKG 03/30/17 reviewed, Afib 70s  Labs    CBC  Recent Labs  04/02/17 0610 04/03/17 0341  WBC 5.8 6.0  HGB 9.6* 9.0*  HCT 29.2* 27.1*  MCV 94.2 93.4  PLT 218 232   Basic Metabolic Panel  Recent Labs  04/01/17 0210 04/02/17 0610 04/03/17 0341  NA 132* 134* 134*  K 3.8 3.6 3.7  CL 98* 98* 97*  CO2 26 27 29   GLUCOSE 106* 99 96  BUN 30* 29* 31*  CREATININE 1.21 1.18 1.17  CALCIUM 8.7* 9.0 8.7*  MG 2.0  --   --      BNP: BNP (  last 3 results)  Recent Labs  11/09/16 1626 11/09/16 1754 11/24/16 1150  BNP 383.3* 328.3* 301.0*       Imaging   Right/Left Heart Cath and Coronary Angiography 03/30/17 1. Markedly elevated right and left heart filling pressures.  2. Severe pulmonary hypertension, but likely predominantly pulmonary venous hypertension with PVR 3.1 WU.  3. Most significant coronary lesion was 75% distal LAD stenosis, but vessel is small caliber at this point.  Doubt this contributes to his symptoms.  Diabetic-appearing vessels with diffuse disease.    Medications:     Scheduled Medications: . atorvastatin  20 mg Oral q1800  . furosemide  80 mg Intravenous TID  . glipiZIDE  2.5 mg Oral BID AC  . hydrALAZINE  75 mg Oral Q8H  . insulin aspart  0-15 Units Subcutaneous TID WC  . insulin aspart  0-5 Units Subcutaneous QHS  . lisinopril  40 mg Oral Daily  .  metoprolol tartrate  50 mg Oral BID  . potassium chloride  40 mEq Oral Daily  . rivaroxaban  20 mg Oral Q supper  . sodium chloride flush  3 mL Intravenous Q12H    Infusions: . sodium chloride      PRN Medications: sodium chloride, acetaminophen, ondansetron (ZOFRAN) IV, sodium chloride flush    Patient Profile   Paul Mclaughlin is a 61 y.o. male with PMH of atrial fibrillation, Diastolic CHF, Congenital heart disease s/p repair at Age 16, and DM2.   Admitted from cath lab 03/30/17 with markedly elevated filling pressures and primarily pulmonary  Assessment/Plan   1. Pulmonary hypertension: Primarily pulmonary venous hypertension, WHO Group 2.  - RHC results above.  - Will need PFT's. Can order.  - Will plan for sleep study outpatient.   2. Acute on chronic diastolic HF: Echo in 3/18 with LV EF 60-65%, RV severely dilated with moderate to severely decreased systolic function. Filling pressures markedly elevated on RHC. - Volume status improving.  - One more day of IV Lasix.   - Then transition to po torsemide 60 daily.  - Continue KCl daily.   3. Moderate non-obstructive CAD: Most significant coronary lesion by cath this admission was 75% distal LAD stenosis.  - Continue medical management with lipitor.  4. Chronic Afib:  - Rate controlled. With some bradycardia yesterday. Consider decreasing metoprolol to 25 mg daily.   5. Congenital Heart Disease:  Underwent surgical repair at age 74. He is not sure what type of congenital issue he had. 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  No step up in oxygen saturation on RHC to suggest significant residual VSD.  - No change   6. DM2: Glipizide, SSI.  - CBG's well controlled.   7. HTN: Stable on current regimen.   Length of Stay: 4  Paul Ishikawa, NP  04/03/2017, 7:29 AM  Advanced Heart Failure Team Pager [phone] (M-F; 7a - 4p)  Please contact [facility] for night-coverage after hours (4p -7a ) and  weekends on [email or web address]  Patient seen with NP, agree with the above note.  He continues to diurese well, volume status improving.  Still some volume overload.  Continue Lasix 80 mg IV bid today, can transition to torsemide 60 mg daily likely tomorrow.   HR dropping into 40s occasionally, generally in 70s (afib).  Would decrease metoprolol to 25 mg bid.   Marca Ancona 04/03/2017

## 2017-04-04 LAB — CBC
HCT: 29.5 % — ABNORMAL LOW (ref 39.0–52.0)
Hemoglobin: 9.6 g/dL — ABNORMAL LOW (ref 13.0–17.0)
MCH: 30.3 pg (ref 26.0–34.0)
MCHC: 32.5 g/dL (ref 30.0–36.0)
MCV: 93.1 fL (ref 78.0–100.0)
PLATELETS: 260 10*3/uL (ref 150–400)
RBC: 3.17 MIL/uL — ABNORMAL LOW (ref 4.22–5.81)
RDW: 13.7 % (ref 11.5–15.5)
WBC: 6.2 10*3/uL (ref 4.0–10.5)

## 2017-04-04 LAB — BASIC METABOLIC PANEL
Anion gap: 8 (ref 5–15)
BUN: 28 mg/dL — ABNORMAL HIGH (ref 6–20)
CHLORIDE: 97 mmol/L — AB (ref 101–111)
CO2: 29 mmol/L (ref 22–32)
CREATININE: 1.14 mg/dL (ref 0.61–1.24)
Calcium: 9 mg/dL (ref 8.9–10.3)
Glucose, Bld: 98 mg/dL (ref 65–99)
Potassium: 3.9 mmol/L (ref 3.5–5.1)
SODIUM: 134 mmol/L — AB (ref 135–145)

## 2017-04-04 LAB — GLUCOSE, CAPILLARY
GLUCOSE-CAPILLARY: 96 mg/dL (ref 65–99)
GLUCOSE-CAPILLARY: 135 mg/dL — AB (ref 65–99)

## 2017-04-04 MED ORDER — HYDRALAZINE HCL 50 MG PO TABS: 75.0000 mg | ORAL_TABLET | Freq: Three times a day (TID) | ORAL | 11 refills | Status: DC

## 2017-04-04 MED ORDER — TORSEMIDE 20 MG PO TABS: 60.0000 mg | ORAL_TABLET | Freq: Every day | ORAL | 6 refills | Status: DC

## 2017-04-04 MED ORDER — METOPROLOL TARTRATE 50 MG PO TABS: 25.0000 mg | ORAL_TABLET | Freq: Two times a day (BID) | ORAL | 3 refills | Status: DC

## 2017-04-04 MED FILL — hydrALAZINE HCL 50 MG TABS: 50 | 20 days supply | Qty: 90 | Fill #0

## 2017-04-04 MED FILL — TORSEMIDE 20 MG TABLET: 20 | 30 days supply | Qty: 90 | Fill #0

## 2017-04-04 NOTE — Progress Notes (Signed)
Discharge instructions given to pt. IV & telemetry removed. All belongings collected & taken with pt. Sanda LingerMilam, Chrystal Zeimet R, RN

## 2017-04-04 NOTE — Plan of Care (Signed)
Problem: Fluid Volume: Goal: Ability to maintain a balanced intake and output will improve Pt educated on the importance of fluid volume maintenance. Pt educated on daily wts & s/s of weight gain & when to call MD.

## 2017-04-04 NOTE — Progress Notes (Signed)
Pt declined ambulation or any HF questions. Discussed pulmonary rehab and gave pt brochure. He is not interested due to the distance and gas money. Encouraged him to call if he changes his mind.  Ethelda ChickKristan Shanan Mcmiller CES, ACSM 3:07 PM 04/04/2017

## 2017-04-04 NOTE — Plan of Care (Signed)
Problem: Nutrition: Goal: Adequate nutrition will be maintained Outcome: Completed/Met Date Met: 04/04/17 Pt educated on the importance of fluid volume maintenance. Pt educated on daily wts & s/s of weight gain & when to call MD.

## 2017-04-04 NOTE — Progress Notes (Signed)
Heart Failure Navigator Consult Note  Presentation: per Dr Shirlee Latch: Paul Mclaughlin is a 61 y.o. male with PMH of atrial fibrillation, Diastolic CHF, Congenital heart disease s/p repair at Age 44, and DM2.   Seen in Dr. Jenene Slicker clinic 03/16/17 for follow up. He was scheduled for L/RHC at this visit to further evaluate with concerns for Type 1 Pulm HTN.  Admitted from cath lab today with markedly elevated filling pressures, severe pulmonary hypertension, predominantly pulmonary venous HTN with PVR 3.1 WU, and mild CAD, with most significant coronary lesion 75% distal LAD stenosis.   Pt feeling OK currently. He states he was taking his medications as directed at home, but has struggled with peripheral edema for a long time. Tries to avoid going to the doctor.  He has never been told he has lung problems. No history of CA, radiation, or chemotherapy.  Never smoker. Previously an occasional THC user. No heavy drinking. 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 Lots of dust in the air. Snores heavily. No history of sleep study or PFTs. Previously weight upwards of 400 lbs. Lost weight by dieting.   Past Medical History:  Diagnosis Date  . Atrial fibrillation (HCC)   . Cellulitis and abscess of left leg 06/2016  . CHF (congestive heart failure) (HCC)   . Diabetes (HCC)   . Dyspnea     Social History   Social History  . Marital status: Divorced    Spouse name: N/A  . Number of children: 1  . Years of education: N/A   Social History Main Topics  . Smoking status: Never Smoker  . Smokeless tobacco: Never Used  . Alcohol use No     Comment: gave up drinking ~20 years ago  . Drug use: No     Comment: not since the 1st of January  . Sexual activity: Not Asked   Other Topics Concern  . None   Social History Narrative   Lives alone     ECHO:Study Conclusions-11/28/16  - Left ventricle: The cavity size was normal. There was moderate   concentric hypertrophy. Systolic function  was normal. The   estimated ejection fraction was in the range of 55% to 60%. Wall   motion was normal; there were no regional wall motion   abnormalities. - Ventricular septum: Septal motion showed paradox. The contour   showed systolic flattening. These changes are consistent with RV   pressure overload. - Mitral valve: Calcified annulus. - Left atrium: The atrium was moderately to severely dilated. - Right ventricle: The cavity size was severely dilated. Wall   thickness was normal. Systolic function was moderately to   severely reduced. - Right atrium: The atrium was severely dilated. - Tricuspid valve: There was mild-moderate regurgitation directed   centrally. - Pulmonary arteries: Systolic pressure was moderately to severely   increased. PA peak pressure: 63 mm Hg (S). - Pericardium, extracardiac: A trivial pericardial effusion was   identified. There was a right pleural effusion.  ------------------------------------------------------------------- Study data:  Comparison was made to the study of 04/26/2016.  Study status:  Routine.  Procedure:  Technically difficult subcostal views due to patient body habitus. The patient reported no pain pre or post test. Transthoracic echocardiography. Image quality was adequate.  Study completion:  There were no complications. Echocardiography.  M-mode, complete 2D, spectral Doppler, and color Doppler.  Birthdate:  Patient birthdate: [birth year].  Age:  Patient is 61 yr old.  Sex:  Gender: male.    BMI: 41.4 kg/m^2.  Blood pressure:     125/70  Patient status:  Inpatient.  Study date: Study date: 11/28/2016. Study time: 10:05 AM  BNP    Component Value Date/Time   BNP 301.0 (H) 11/24/2016 1150    ProBNP No results found for: PROBNP   Education Assessment and Provision:  Detailed education and instructions provided on heart failure disease management including the following:  Signs and symptoms of Heart Failure When to call  the physician Importance of daily weights Low sodium diet Fluid restriction Medication management Anticipated future follow-up appointments  Patient education given on each of the above topics.  Patient acknowledges understanding and acceptance of all instructions.  I reviewed the importance of daily weights and when to contact the physician related to worsening signs and symptoms.  I reviewed a low sodium diet and high sodium foods to avoid.  No insurance noted.  Patient [facility]  He will follow in the AHF Clinic after discharge.  Education Materials:  "Living Better With Heart Failure" Booklet, Daily Weight Tracker Tool    High Risk Criteria for Readmission and/or Poor Patient Outcomes:  (Recommend Follow-up with Advanced Heart Failure Clinic)   EF <30%- No 55-60%  2 or more admissions in 6 months- No  Difficult social situation- Denies  Demonstrates medication noncompliance-Denies   Barriers of Care: Financial, Knowledge and compliance  Discharge Planning:   [place]  He would benefit from [facility].  I will make referral.

## 2017-04-04 NOTE — Progress Notes (Signed)
Patient ID: Paul Mclaughlin, male   DOB: 08/29/1956, 61 y.o.   MRN: [id number]     Advanced Heart Failure Rounding Note  PCP: Dr Venetia Night Primary Cardiologist: Dr. Antoine Poche HF: Dr. Shirlee Latch   Subjective:    Admitted 03/30/17 with acute on chronic diastolic CHF, PAH.   Weight down 34 pounds. Walked with cardiac rehab, denies SOB. Feels well.   LHC/RHC Coronary Findings   Dominance: Right  Left Main  25% distal left main stenosis.  Left Anterior Descending  30% proximal LAD stenosis. 75% distal LAD stenosis (small caliber vessel at this point).  Left Circumflex  50% ostial stenosis small OM2. Luminal irregularities diffusely.  Right Coronary Artery  Luminal irregularities.  Right Heart   Right Heart Pressures RHC Procedural Findings: Hemodynamics (mmHg) RA mean 23 RV 90/23 PA 94/35, mean 56 PCWP mean 31 LV 155/31 AO 151/86  Oxygen saturations: RA 66% RV 65% PA 61% AO 98%  Cardiac Output (Fick) 8.03  Cardiac Index (Fick) 3.09 PVR 3.1 WU       Objective:   Weight Range: 276 lb 3.2 oz (125.3 kg) Body mass index is 36.44 kg/m.   Vital Signs:   Temp:  [97.6 F (36.4 C)-98.3 F (36.8 C)] 98.3 F (36.8 C) (07/24 0728) Pulse Rate:  [54-80] 72 (07/24 0845) Resp:  [15-18] 15 (07/24 0728) BP: (105-149)/(48-85) 129/68 (07/24 0845) SpO2:  [92 %-98 %] 98 % (07/24 0728) Weight:  [276 lb 3.2 oz (125.3 kg)] 276 lb 3.2 oz (125.3 kg) (07/24 0400) Last BM Date: 04/03/17 Weight change: Filed Weights   04/02/17 0559 04/03/17 0412 04/04/17 0400  Weight: 289 lb 14.4 oz (131.5 kg) 280 lb 12.8 oz (127.4 kg) 276 lb 3.2 oz (125.3 kg)   Intake/Output:   Intake/Output Summary (Last 24 hours) at 04/04/17 0918 Last data filed at 04/04/17 0800  Gross per 24 hour  Intake              440 ml  Output             2925 ml  Net            -2485 ml    Physical Exam   General: Male, NAD.[name]  HEENT: Normal Neck: Supple. 8-9 cm JVP.   Carotids 2+ bilat; no bruits. No lymphadenopathy or  thyromegaly appreciated. Cor: PMI nondisplaced. Irregularly irregular. No rubs, gallops or murmurs. Lungs: CTAB Abdomen: Obese, soft, nontender, remains distended. No hepatosplenomegaly. No bruits or masses.Bowel sounds present.  Extremities: No cyanosis, clubbing, or rash. Trace pedal edema.  Neuro: A&O x 3.  cranial nerves grossly intact. moves all 4 extremities w/o difficulty. Affect pleasant  Telemetry  Afib, rates 60-70. One episode of bradycardia in the 40's. - personally reviewed.   EKG    EKG 03/30/17 - Afib.   Labs    CBC  Recent Labs  04/03/17 0341 04/04/17 0208  WBC 6.0 6.2  HGB 9.0* 9.6*  HCT 27.1* 29.5*  MCV 93.4 93.1  PLT 232 260   Basic Metabolic Panel  Recent Labs  04/03/17 0341 04/04/17 0208  NA 134* 134*  K 3.7 3.9  CL 97* 97*  CO2 29 29  GLUCOSE 96 98  BUN 31* 28*  CREATININE 1.17 1.14  CALCIUM 8.7* 9.0     BNP: BNP (last 3 results)  Recent Labs  11/09/16 1626 11/09/16 1754 11/24/16 1150  BNP 383.3* 328.3* 301.0*       Imaging   Right/Left Heart Cath and Coronary Angiography 03/30/17  1. Markedly elevated right and left heart filling pressures.  2. Severe pulmonary hypertension, but likely predominantly pulmonary venous hypertension with PVR 3.1 WU.  3. Most significant coronary lesion was 75% distal LAD stenosis, but vessel is small caliber at this point.  Doubt this contributes to his symptoms.  Diabetic-appearing vessels with diffuse disease.    Medications:     Scheduled Medications: . atorvastatin  20 mg Oral q1800  . glipiZIDE  2.5 mg Oral BID AC  . hydrALAZINE  75 mg Oral Q8H  . insulin aspart  0-15 Units Subcutaneous TID WC  . insulin aspart  0-5 Units Subcutaneous QHS  . lisinopril  40 mg Oral Daily  . metoprolol tartrate  25 mg Oral BID  . potassium chloride  40 mEq Oral Daily  . rivaroxaban  20 mg Oral Q supper  . sodium chloride flush  3 mL Intravenous Q12H  . torsemide  60 mg Oral Daily    Infusions: .  sodium chloride      PRN Medications: sodium chloride, acetaminophen, ondansetron (ZOFRAN) IV, sodium chloride flush    Patient Profile   Paul Mclaughlin is a 61 y.o. male with PMH of atrial fibrillation, Diastolic CHF, Congenital heart disease s/p repair at Age 89, and DM2.   Admitted from cath lab 03/30/17 with markedly elevated filling pressures and primarily pulmonary  Assessment/Plan   1. Pulmonary hypertension: Primarily pulmonary venous hypertension, WHO Group 2.  - RHC results above.  - PFT's ordered for today.  - Will plan for sleep study outpatient.   2. Acute on chronic diastolic HF: Echo in 3/18 with LV EF 60-65%, RV severely dilated with moderate to severely decreased systolic function. Filling pressures markedly elevated on RHC. - NYHA II - Can transition to po torsemide today. - 60 mg daily. - Metoprolol reduced for bradycardia. Continue 25 mg BID.  3. Moderate non-obstructive CAD: Most significant coronary lesion by cath this admission was 75% distal LAD stenosis.  - Continue medical management.  - No ASA with Xarelto and stable CAD.   4. Chronic Afib:  - Rate controlled with some infrequent bradycardia.  - Continue Xarelto for anticoagulation.   5. Congenital Heart Disease:  Underwent surgical repair at age 89. He is not sure what type of congenital issue he had. 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  No step up in oxygen saturation on RHC to suggest significant residual VSD.  - No change to current plan.   6. DM2: Glipizide, SSI.  - No change to current plan.   7. HTN: Stable on current regimen.   Dispo: can likely dc today or tomorrow. Will make follow up in the CHF clinic.   Length of Stay: 5  Little IshikawaErin E Smith, NP  04/04/2017, 9:18 AM  Advanced Heart Failure Team Pager [phone] (M-F; 7a - 4p)  Please contact [facility] for night-coverage after hours (4p -7a ) and weekends on [email or web address]  Patient seen with NP, agree with the above  note.  He diuresed well again yesterday, weight down 34 lbs.  He is now on torsemide.  He feels good.  I am going to let him go home today.  He will go home on torsemide 60 mg daily instead of Lasix.  He will get KCl 20 daily.  He will followup in CHF clinic in 10-14 days.    Marca AnconaDalton Ronny Korff 04/04/2017

## 2017-04-04 NOTE — Discharge Summary (Signed)
Advanced Heart Failure Discharge Note  Discharge Summary   Patient ID: Paul Mclaughlin MRN: [id number], DOB/AGE: 06-01-1956 61 y.o. Admit date: 03/30/2017 D/C date:     04/04/2017   Primary Discharge Diagnoses:  1. Pulmonary HTN 2. Acute on chronic diastolic CHF 3. Moderate non-obstructive CAD 4. Chronic atrial fibrillation  5. Congenital heart disease 6. DM    Hospital Course: [name] is a 61 year old male with a past medical history of chronic diastolic CHF, atrial fibrillation, DM, and congenital heart diease. He was seen in Dr. Jenene SlickerHochrein's office on 03/16/17 and set up for a right and left heart cath with concerns for type 1 pulmonary HTN.   He was admitted from the cath lab on 03/30/17 with elevated filling pressures, severe pulmonary HTN (predominantly pulmonary venous HTN with PVR 3.1 WU). Also with 75% distal LAD. He was started on IV diuresis and lost 34 pounds total. He was transitioned to 60mg  torsemide daily (he had previously been on lasix at home).   He was noted to have episodes of bradycardia with rates in the 40-50's, mostly at night. He was asymptomatic. His metoprolol was reduced to 25 mg BID. He will need PFT's ordered outpatient to assess for lung disease. Also will need sleep study ordered outpatient.   He walked with cardiac rehab and did well, no SOB. He will be seen in the CHF clinic in 10-14 days.   Discharge Weight: 276 pounds.  Discharge Vitals: Blood pressure 110/67, pulse 72, temperature 98.2 F (36.8 C), temperature source Oral, resp. rate 16, height 6\' 1"  (1.854 m), weight 276 lb 3.2 oz (125.3 kg), SpO2 99 %.  Labs: Lab Results  Component Value Date   WBC 6.2 04/04/2017   HGB 9.6 (L) 04/04/2017   HCT 29.5 (L) 04/04/2017   MCV 93.1 04/04/2017   PLT 260 04/04/2017     Recent Labs Lab 04/04/17 0208  NA 134*  K 3.9  CL 97*  CO2 29  BUN 28*  CREATININE 1.14  CALCIUM 9.0  GLUCOSE 98   Lab Results  Component Value Date   CHOL 131 06/25/2016     HDL 15 (L) 06/25/2016   LDLCALC 86 06/25/2016   TRIG 149 06/25/2016   BNP (last 3 results)  Recent Labs  11/09/16 1626 11/09/16 1754 11/24/16 1150  BNP 383.3* 328.3* 301.0*    ProBNP (last 3 results) No results for input(s): PROBNP in the last 8760 hours.   Diagnostic Studies/Procedures   Right/Left Heart Cath and Coronary Angiography 03/30/17 1. Markedly elevated right and left heart filling pressures.  2. Severe pulmonary hypertension, but likely predominantly pulmonary venous hypertension with PVR 3.1 WU.  3. Most significant coronary lesion was 75% distal LAD stenosis, but vessel is small caliber at this point.  Doubt this contributes to his symptoms.  Diabetic-appearing vessels with diffuse disease  Discharge Medications   Allergies as of 04/04/2017   No Known Allergies     Medication List    STOP taking these medications   furosemide 40 MG tablet Commonly known as:  LASIX   nitroGLYCERIN 0.2 mg/hr patch Commonly known as:  NITRODUR - Dosed in mg/24 hr   Potassium Chloride ER 20 MEQ Tbcr     TAKE these medications   albuterol 108 (90 Base) MCG/ACT inhaler Commonly known as:  PROVENTIL HFA;VENTOLIN HFA Inhale 2 puffs into the lungs every 6 (six) hours as needed for wheezing or shortness of breath.   atorvastatin 20 MG tablet Commonly known as:  LIPITOR Take 1 tablet (20 mg total) by mouth daily.   gabapentin 300 MG capsule Commonly known as:  NEURONTIN TAKE 1 CAPSULE BY MOUTH 2 TIMES DAILY.   glipiZIDE 5 MG tablet Commonly known as:  GLUCOTROL Take 0.5 tablets (2.5 mg total) by mouth 2 (two) times daily before a meal.   hydrALAZINE 50 MG tablet Commonly known as:  APRESOLINE Take 1.5 tablets (75 mg total) by mouth 3 (three) times daily. What changed:  how much to take   lisinopril 40 MG tablet Commonly known as:  PRINIVIL,ZESTRIL Take 1 tablet (40 mg total) by mouth daily.   metFORMIN 500 MG tablet Commonly known as:  GLUCOPHAGE TAKE 1  TABLET BY MOUTH 2 TIMES DAILY WITH A MEAL.   metoprolol tartrate 50 MG tablet Commonly known as:  LOPRESSOR Take 0.5 tablets (25 mg total) by mouth 2 (two) times daily. What changed:  how much to take   potassium chloride SA 20 MEQ tablet Commonly known as:  K-DUR,KLOR-CON Take 1 tablet (20 mEq total) by mouth daily.   rivaroxaban 20 MG Tabs tablet Commonly known as:  XARELTO Take 1 tablet (20 mg total) by mouth daily with supper.   torsemide 20 MG tablet Commonly known as:  DEMADEX Take 3 tablets (60 mg total) by mouth daily.   traZODone 100 MG tablet Commonly known as:  DESYREL Take 1 tablet (100 mg total) by mouth at bedtime as needed for sleep.       Disposition   The patient will be discharged in stable condition to home. Discharge Instructions    Diet - low sodium heart healthy    Complete by:  As directed    Increase activity slowly    Complete by:  As directed    STOP any activity that causes chest pain, shortness of breath, dizziness, sweating, or exessive weakness    Complete by:  As directed      Follow-up Information    [facility] Follow up on 04/12/2017.   Specialty:  Cardiology Why:  at 3:00 for hospital follow up. Garage code [phone] (try [phone] if that doesnt work).  Contact information: [address] [id number] mc [place] [address] [phone]            Duration of Discharge Encounter: Greater than 35 minutes   Signed, Little Ishikawa, NP 04/04/2017, 2:58 PM

## 2017-04-06 ENCOUNTER — Encounter: Payer: Self-pay | Admitting: Family Medicine

## 2017-04-06 ENCOUNTER — Ambulatory Visit: Payer: Self-pay | Attending: Family Medicine | Admitting: Family Medicine

## 2017-04-06 VITALS — BP 137/72 | HR 95 | Temp 98.7°F | Resp 18 | Ht 73.0 in | Wt 277.0 lb

## 2017-04-06 DIAGNOSIS — I272 Pulmonary hypertension, unspecified: Secondary | ICD-10-CM | POA: Insufficient documentation

## 2017-04-06 DIAGNOSIS — G4709 Other insomnia: Secondary | ICD-10-CM | POA: Insufficient documentation

## 2017-04-06 DIAGNOSIS — I4891 Unspecified atrial fibrillation: Secondary | ICD-10-CM | POA: Insufficient documentation

## 2017-04-06 DIAGNOSIS — Z89422 Acquired absence of other left toe(s): Secondary | ICD-10-CM | POA: Insufficient documentation

## 2017-04-06 DIAGNOSIS — I10 Essential (primary) hypertension: Secondary | ICD-10-CM

## 2017-04-06 DIAGNOSIS — Z7901 Long term (current) use of anticoagulants: Secondary | ICD-10-CM | POA: Insufficient documentation

## 2017-04-06 DIAGNOSIS — Z7984 Long term (current) use of oral hypoglycemic drugs: Secondary | ICD-10-CM | POA: Insufficient documentation

## 2017-04-06 DIAGNOSIS — E118 Type 2 diabetes mellitus with unspecified complications: Secondary | ICD-10-CM | POA: Insufficient documentation

## 2017-04-06 DIAGNOSIS — I110 Hypertensive heart disease with heart failure: Secondary | ICD-10-CM | POA: Insufficient documentation

## 2017-04-06 DIAGNOSIS — E1149 Type 2 diabetes mellitus with other diabetic neurological complication: Secondary | ICD-10-CM | POA: Insufficient documentation

## 2017-04-06 DIAGNOSIS — Z79899 Other long term (current) drug therapy: Secondary | ICD-10-CM | POA: Insufficient documentation

## 2017-04-06 DIAGNOSIS — I5031 Acute diastolic (congestive) heart failure: Secondary | ICD-10-CM | POA: Insufficient documentation

## 2017-04-06 LAB — GLUCOSE, POCT (MANUAL RESULT ENTRY): POC Glucose: 135 mg/dl — AB (ref 70–99)

## 2017-04-06 MED ORDER — ATORVASTATIN CALCIUM 20 MG PO TABS: 20.0000 mg | ORAL_TABLET | Freq: Every day | ORAL | 3 refills | Status: DC

## 2017-04-06 MED ORDER — POTASSIUM CHLORIDE CRYS ER 20 MEQ PO TBCR: 20.0000 meq | EXTENDED_RELEASE_TABLET | Freq: Every day | ORAL | 3 refills | Status: DC

## 2017-04-06 MED ORDER — GABAPENTIN 300 MG PO CAPS: ORAL_CAPSULE | ORAL | 3 refills | Status: DC

## 2017-04-06 MED ORDER — GLIPIZIDE 5 MG PO TABS: 2.5000 mg | ORAL_TABLET | Freq: Two times a day (BID) | ORAL | 3 refills | Status: DC

## 2017-04-06 MED ORDER — LISINOPRIL 40 MG PO TABS: 40.0000 mg | ORAL_TABLET | Freq: Every day | ORAL | 3 refills | Status: DC

## 2017-04-06 MED ORDER — TRAZODONE HCL 100 MG PO TABS: 100.0000 mg | ORAL_TABLET | Freq: Every evening | ORAL | 3 refills | Status: DC | PRN

## 2017-04-06 MED ORDER — METFORMIN HCL 500 MG PO TABS: ORAL_TABLET | ORAL | 3 refills | Status: DC

## 2017-04-06 MED FILL — traZODone HCL 100 MG TABS: 100 | 30 days supply | Qty: 30 | Fill #0

## 2017-04-06 MED FILL — GABAPENTIN 300 MG CAPSULE: 300 | 30 days supply | Qty: 60 | Fill #0

## 2017-04-06 MED FILL — glipiZIDE 5 MG TABS: 5 | 30 days supply | Qty: 30 | Fill #0

## 2017-04-06 MED FILL — LISINOPRIL 40 MG TABLET: 40 | 30 days supply | Qty: 30 | Fill #0

## 2017-04-06 NOTE — Progress Notes (Signed)
Subjective:  Patient ID: Paul Mclaughlin, male    DOB: 11-13-1955  Age: 61 y.o. MRN: [id number]  CC: Foot Ulcer   HPI Paul Mclaughlin  is a 61 year old male with a history of type 2 diabetes mellitus (A1c 5.4), hypertension, atrial fibrillation (on anticoagulation with Xarelto and rate control with metoprolol), history of ?congenital heart disease status post surgery,Pulmonary hypertension, congestive heart failure ( EF of 55-60% from 2-D echo 11/2016) recently hospitalized for acute on chronic congestive heart failure.   He was admitted from the cath lab on 03/30/17 with elevated filling pressures, severe pulmonary HTN (predominantly pulmonary venous HTN with PVR 3.1 WU). Also with 75% distal LAD. He was started on IV diuresis and lost 34 pounds total. He was transitioned to 60mg  torsemide daily (he had previously been on lasix at home).  During his hospital course his dose of metoprolol was reduced due to bradycardia; discharge weight was 267 pounds. Recommendation was for outpatient pulmonary function tests to evaluate for pulmonary causes of pulmonary hypertension.  He denies any chest pains or shortness of breath and his weight has been stable. He has been checking his weights daily and adhering to low sodium, cardiac diet. The left foot diabetic ulcer he had previously has healed.  Past Medical History:  Diagnosis Date  . Atrial fibrillation (HCC)   . Cellulitis and abscess of left leg 06/2016  . CHF (congestive heart failure) (HCC)   . Diabetes (HCC)   . Dyspnea     Past Surgical History:  Procedure Laterality Date  . AMPUTATION Left 06/24/2016   Procedure: FOOT FIFTH RAY TOE AMPUTATION;  Surgeon: Nadara MustardMarcus V Duda, MD;  Location: [facility] OR;  Service: Orthopedics;  Laterality: Left;  . open heart surgery     As a child.  [age] years old.  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   [name]. RIGHT/LEFT HEART CATH AND CORONARY ANGIOGRAPHY N/A 03/30/2017   Procedure: Right/Left Heart Cath and Coronary Angiography;  Surgeon:  Laurey MoraleMcLean, Dalton S, MD;  [facility];  Service: Cardiovascular;  Laterality: N/A;    No Known Allergies   Outpatient Medications Prior to Visit  Medication Sig Dispense Refill  . albuterol (PROVENTIL HFA;VENTOLIN HFA) 108 (90 Base) MCG/ACT inhaler Inhale 2 puffs into the lungs every 6 (six) hours as needed for wheezing or shortness of breath. 1 Inhaler 0  . hydrALAZINE (APRESOLINE) 50 MG tablet Take 1.5 tablets (75 mg total) by mouth 3 (three) times daily. 90 tablet 11  . metoprolol tartrate (LOPRESSOR) 50 MG tablet Take 0.5 tablets (25 mg total) by mouth 2 (two) times daily. 60 tablet 3  . rivaroxaban (XARELTO) 20 MG TABS tablet Take 1 tablet (20 mg total) by mouth daily with supper. 90 tablet 3  . torsemide (DEMADEX) 20 MG tablet Take 3 tablets (60 mg total) by mouth daily. 90 tablet 6  . atorvastatin (LIPITOR) 20 MG tablet Take 1 tablet (20 mg total) by mouth daily. 30 tablet 3  . gabapentin (NEURONTIN) 300 MG capsule TAKE 1 CAPSULE BY MOUTH 2 TIMES DAILY. 60 capsule 3  . glipiZIDE (GLUCOTROL) 5 MG tablet Take 0.5 tablets (2.5 mg total) by mouth 2 (two) times daily before a meal. 30 tablet 3  . lisinopril (PRINIVIL,ZESTRIL) 40 MG tablet Take 1 tablet (40 mg total) by mouth daily. 30 tablet 3  . metFORMIN (GLUCOPHAGE) 500 MG tablet TAKE 1 TABLET BY MOUTH 2 TIMES DAILY WITH A MEAL. 60 tablet 3  . potassium chloride SA (K-DUR,KLOR-CON) 20 MEQ tablet  Take 1 tablet (20 mEq total) by mouth daily. 30 tablet 3  . traZODone (DESYREL) 100 MG tablet Take 1 tablet (100 mg total) by mouth at bedtime as needed for sleep. 30 tablet 3   No facility-administered medications prior to visit.     ROS Review of Systems  Constitutional: Negative for activity change and appetite change.  HENT: Negative for sinus pressure and sore throat.   Eyes: Negative for visual disturbance.  Respiratory: Negative for cough, chest tightness and shortness of breath.   Cardiovascular: Negative for chest  pain and leg swelling.  Gastrointestinal: Negative for abdominal distention, abdominal pain, constipation and diarrhea.  Endocrine: Negative.   Genitourinary: Negative for dysuria.  Musculoskeletal: Negative for joint swelling and myalgias.  Skin: Negative for rash.  Allergic/Immunologic: Negative.   Neurological: Negative for weakness, light-headedness and numbness.  Psychiatric/Behavioral: Negative for dysphoric mood and suicidal ideas.    Objective:  BP 137/72 (BP Location: Right Arm, Patient Position: Sitting, Cuff Size: Normal)   Pulse 95   Temp 98.7 F (37.1 C) (Oral)   Resp 18   Ht 6\' 1"  (1.854 m)   Wt 277 lb (125.6 kg)   SpO2 95%   BMI 36.55 kg/m   BP/Weight 04/06/2017 04/04/2017 03/30/2017  Systolic BP 137 110 -  Diastolic BP 72 67 -  Wt. (Lbs) 277 276.2 -  BMI 36.55 - 36.44      Physical Exam Constitutional: He is oriented to person, place, and time. He appears well-developed and well-nourished.  Neck: No JVD present.  Cardiovascular: Normal rate, normal heart sounds and intact distal pulses.  No pedal edema No murmur heard. Pulmonary/Chest: Effort normal and breath sounds normal. He has no wheezes. He has no rales. He exhibits no tenderness.  Abdominal: Soft. Bowel sounds are normal. He exhibits slight abdominal distension, He exhibits no mass. There is no tenderness.  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  Neurological: He is alert and oriented to person, place, and time.  Psychiatric: He has a normal mood and affect  Lab Results  Component Value Date   HGBA1C 5.4 02/08/2017    Assessment & Plan:   1. Type 2 diabetes mellitus with complication, without long-term current use of insulin (HCC) Controlled with A1c of 5.4 Discussed hypoglycemia protocol-he has been advised to discontinue glipizide if hypoglycemia occurs Continue diabetic diet - Glucose (CBG) - metFORMIN (GLUCOPHAGE) 500 MG tablet; TAKE 1 TABLET BY MOUTH 2 TIMES DAILY  WITH A MEAL.  Dispense: 60 tablet; Refill: 3 - atorvastatin (LIPITOR) 20 MG tablet; Take 1 tablet (20 mg total) by mouth daily.  Dispense: 30 tablet; Refill: 3 - glipiZIDE (GLUCOTROL) 5 MG tablet; Take 0.5 tablets (2.5 mg total) by mouth 2 (two) times daily before a meal.  Dispense: 30 tablet; Refill: 3  2. Type 2 diabetes mellitus with other neurologic complication, without long-term current use of insulin (HCC) Controlled - gabapentin (NEURONTIN) 300 MG capsule; TAKE 1 CAPSULE BY MOUTH 2 TIMES DAILY.  Dispense: 60 capsule; Refill: 3  3. Essential hypertension Controlled - lisinopril (PRINIVIL,ZESTRIL) 40 MG tablet; Take 1 tablet (40 mg total) by mouth daily.  Dispense: 30 tablet; Refill: 3  4. Acute diastolic congestive heart failure (HCC) Euvolemic, EF 55-60% from 11/2016 Weight is stable Daily weights, monitor fluid intake - potassium chloride SA (K-DUR,KLOR-CON) 20 MEQ tablet; Take 1 tablet (20 mEq total) by mouth daily.  Dispense: 30 tablet; Refill: 3  5. Pulmonary HTN (HCC) Will need to exclude pulmonary causes of  pulmonary hypertension - Pulmonary function test; Future  6. Other insomnia Controlled - traZODone (DESYREL) 100 MG tablet; Take 1 tablet (100 mg total) by mouth at bedtime as needed for sleep.  Dispense: 30 tablet; Refill: 3   Meds ordered this encounter  Medications  . metFORMIN (GLUCOPHAGE) 500 MG tablet    Sig: TAKE 1 TABLET BY MOUTH 2 TIMES DAILY WITH A MEAL.    Dispense:  60 tablet    Refill:  3  . gabapentin (NEURONTIN) 300 MG capsule    Sig: TAKE 1 CAPSULE BY MOUTH 2 TIMES DAILY.    Dispense:  60 capsule    Refill:  3  . traZODone (DESYREL) 100 MG tablet    Sig: Take 1 tablet (100 mg total) by mouth at bedtime as needed for sleep.    Dispense:  30 tablet    Refill:  3  . lisinopril (PRINIVIL,ZESTRIL) 40 MG tablet    Sig: Take 1 tablet (40 mg total) by mouth daily.    Dispense:  30 tablet    Refill:  3    Discontinue previous dose  . potassium  chloride SA (K-DUR,KLOR-CON) 20 MEQ tablet    Sig: Take 1 tablet (20 mEq total) by mouth daily.    Dispense:  30 tablet    Refill:  3  . atorvastatin (LIPITOR) 20 MG tablet    Sig: Take 1 tablet (20 mg total) by mouth daily.    Dispense:  30 tablet    Refill:  3  . glipiZIDE (GLUCOTROL) 5 MG tablet    Sig: Take 0.5 tablets (2.5 mg total) by mouth 2 (two) times daily before a meal.    Dispense:  30 tablet    Refill:  3    Discontinue previous dose    Follow-up: Return for Follow-up of chronic medical conditions, keep previously scheduled appointment.   Jaclyn Shaggy MD

## 2017-04-06 NOTE — Patient Instructions (Signed)
Pulmonary Hypertension Pulmonary hypertension is high blood pressure within the arteries in your lungs (pulmonary arteries). It is different than having high blood pressure elsewhere in your body, such as blood pressure that is measured with a blood pressure cuff. Pulmonary hypertension makes it harder for blood to flow through the lungs. As a result, the heart must work harder to pump blood through the lungs, and it may be harder for you to breathe. Over time, this can weaken the heart muscle. Pulmonary hypertension is a serious condition and it can be fatal. What are the causes? Many different medical conditions can cause pulmonary hypertension. Pulmonary hypertension can be categorized by cause into five groups: Group 1 Pulmonary hypertension that is caused by abnormal growth of small blood vessels in the lungs (pulmonary arterial hypertension). The abnormal blood vessel growth may have no known cause, or it may be:  Passed along from a parent (hereditary).  Caused by another disease, such as a connective tissue disease (including lupus or scleroderma) or HIV.  Caused by certain drugs or toxins.  Group 2 Pulmonary hypertension that is caused by weakness of the main chamber of the heart (left ventricle) or heart valve disease. Group 3 Pulmonary hypertension that is caused by lung disease or low oxygen levels. Causes in this group include:  Emphysema or chronic obstructive pulmonary disease (COPD).  Untreated sleep apnea.  Pulmonary fibrosis.  Group 4 Pulmonary hypertension that is caused by blood clots in the lungs (pulmonary emboli). Group 5 Other causes of pulmonary hypertension, such as sickle cell anemia, or a mix of multiple causes. What are the signs or symptoms? Symptoms of this condition include:  Shortness of breath. You may notice shortness of breath with: ? Activity, such as walking. ? No activity.  Tiredness and fatigue.  Dizziness or fainting.  Rapid heartbeat or  feeling your heart flutter or skip a beat (palpitations).  Neck vein enlargement.  Bluish color to your lips and fingertips.  How is this diagnosed? This condition may be diagnosed by:  Chest X-ray.  Arterial blood gases. This test checks the acidity of your blood as well as your blood oxygen and carbon dioxide levels.  CT scan. This test can provide detailed images of your lungs.  Pulmonary function test. This test measures how much air your lungs can hold. It also tests how well air moves in and out of your lungs.  Electrocardiogram (ECG). This test traces the electrical activity of your heart.  Echocardiogram. This test is used to look at your heart in motion and check how it is functioning.  Heart catheterization. This test can measure the pressure in your pulmonary artery and the right side of your heart.  Lung biopsy. This procedure involves checking a sample of lung tissue to find underlying causes.  How is this treated? There is no cure for pulmonary hypertension, but treatment can help to relieve symptoms and slow the progress of the condition. Treatment can involve:  Medicines, such as: ? Blood pressure medicines. ? Medicines to relax (dilate) the pulmonary blood vessels. ? Water pills to get rid of extra fluid (diuretic medicines). ? Blood-thinning medicines.  Surgery. For severe pulmonary hypertension that does not respond to medical treatment, heart-lung or lung transplant may be needed.  Follow these instructions at home:  Take medicines only as directed by your health care provider. These include over-the-counter medicines and prescription medicines. Take all medicines exactly as instructed. Do not change or stop medicines without first checking with your health   care provider.  Do not smoke. If you need help quitting, ask your health care provider.  Eat a healthy diet.  Limit your salt (sodium) intake to less than 2,300 mg per day.  Stay as active as  possible. Exercise as directed by your health care provider. Talk with your health care provider about what type of exercise is safe for you.  Avoid high altitudes.  Avoid hot tubs and saunas.  Avoid becoming pregnant, if this applies. Talk with your health care provider about safe methods of birth control.  Keep all follow-up visits as directed by your health care provider. This is important. Get help right away if:  You have severe shortness of breath.  You develop chest pain or pressure in your chest.  You cough up blood.  You develop swelling of your feet or legs.  You have a significant increase in weight within 1-2 days. This information is not intended to replace advice given to you by your health care provider. Make sure you discuss any questions you have with your health care provider. Document Released: 06/26/2007 Document Revised: 03/18/2016 Document Reviewed: 02/18/2013 Elsevier Interactive Patient Education  2018 [employer]  

## 2017-04-10 ENCOUNTER — Ambulatory Visit (HOSPITAL_COMMUNITY)
Admission: RE | Admit: 2017-04-10 | Discharge: 2017-04-10 | Disposition: A | Discharge status: Home / Self Care | Payer: Self-pay | Source: Ambulatory Visit | Attending: Family Medicine | Admitting: Family Medicine

## 2017-04-10 DIAGNOSIS — R942 Abnormal results of pulmonary function studies: Secondary | ICD-10-CM | POA: Insufficient documentation

## 2017-04-10 DIAGNOSIS — I272 Pulmonary hypertension, unspecified: Secondary | ICD-10-CM

## 2017-04-10 DIAGNOSIS — J449 Chronic obstructive pulmonary disease, unspecified: Secondary | ICD-10-CM | POA: Insufficient documentation

## 2017-04-10 LAB — PULMONARY FUNCTION TEST
DL/VA % PRED: 73 %
DL/VA: 3.5 ml/min/mmHg/L
DLCO COR % PRED: 59 %
DLCO COR: 21.78 ml/min/mmHg
DLCO UNC % PRED: 52 %
DLCO unc: 19.02 ml/min/mmHg
FEF 25-75 POST: 1.73 L/s
FEF 25-75 Pre: 1.4 L/sec
FEF2575-%CHANGE-POST: 24 %
FEF2575-%Pred-Post: 53 %
FEF2575-%Pred-Pre: 42 %
FEV1-%CHANGE-POST: 6 %
FEV1-%Pred-Post: 59 %
FEV1-%Pred-Pre: 55 %
FEV1-POST: 2.38 L
FEV1-Pre: 2.23 L
FEV1FVC-%CHANGE-POST: 5 %
FEV1FVC-%PRED-PRE: 71 %
FEV6-%Change-Post: 1 %
FEV6-%Pred-Post: 82 %
FEV6-%Pred-Pre: 81 %
FEV6-POST: 4.18 L
FEV6-Pre: 4.1 L
FEV6FVC-%Change-Post: 0 %
FEV6FVC-%PRED-POST: 104 %
FEV6FVC-%Pred-Pre: 103 %
FVC-%Change-Post: 1 %
FVC-%PRED-POST: 79 %
FVC-%PRED-PRE: 78 %
FVC-POST: 4.18 L
FVC-PRE: 4.13 L
PRE FEV6/FVC RATIO: 99 %
Post FEV1/FVC ratio: 57 %
Post FEV6/FVC ratio: 100 %
Pre FEV1/FVC ratio: 54 %
RV % pred: 133 %
RV: 3.23 L
TLC % PRED: 97 %
TLC: 7.4 L

## 2017-04-10 LAB — BLOOD GAS, ARTERIAL
Acid-Base Excess: 3.9 mmol/L — ABNORMAL HIGH (ref 0.0–2.0)
Bicarbonate: 27.6 mmol/L (ref 20.0–28.0)
DRAWN BY: 27052
O2 SAT: 96.2 %
PATIENT TEMPERATURE: 98.6
PCO2 ART: 39.8 mmHg (ref 32.0–48.0)
pH, Arterial: 7.456 — ABNORMAL HIGH (ref 7.350–7.450)
pO2, Arterial: 81.9 mmHg — ABNORMAL LOW (ref 83.0–108.0)

## 2017-04-10 MED ORDER — ALBUTEROL SULFATE (2.5 MG/3ML) 0.083% IN NEBU
2.5000 mg | INHALATION_SOLUTION | Freq: Once | RESPIRATORY_TRACT | Status: AC
  Administered 2017-04-10: 2.5 mg via RESPIRATORY_TRACT

## 2017-04-12 ENCOUNTER — Ambulatory Visit (HOSPITAL_COMMUNITY)
Admission: RE | Admit: 2017-04-12 | Discharge: 2017-04-12 | Disposition: A | Discharge status: Home / Self Care | Payer: Self-pay | Source: Ambulatory Visit | Attending: Internal Medicine | Admitting: Internal Medicine

## 2017-04-12 ENCOUNTER — Encounter (HOSPITAL_COMMUNITY): Payer: Self-pay

## 2017-04-12 VITALS — BP 106/50 | Wt 272.5 lb

## 2017-04-12 DIAGNOSIS — I5032 Chronic diastolic (congestive) heart failure: Secondary | ICD-10-CM | POA: Insufficient documentation

## 2017-04-12 DIAGNOSIS — E119 Type 2 diabetes mellitus without complications: Secondary | ICD-10-CM | POA: Insufficient documentation

## 2017-04-12 DIAGNOSIS — Z7984 Long term (current) use of oral hypoglycemic drugs: Secondary | ICD-10-CM | POA: Insufficient documentation

## 2017-04-12 DIAGNOSIS — Z8774 Personal history of (corrected) congenital malformations of heart and circulatory system: Secondary | ICD-10-CM | POA: Insufficient documentation

## 2017-04-12 DIAGNOSIS — I4819 Other persistent atrial fibrillation: Secondary | ICD-10-CM

## 2017-04-12 DIAGNOSIS — Z833 Family history of diabetes mellitus: Secondary | ICD-10-CM | POA: Insufficient documentation

## 2017-04-12 DIAGNOSIS — I481 Persistent atrial fibrillation: Secondary | ICD-10-CM

## 2017-04-12 DIAGNOSIS — I482 Chronic atrial fibrillation: Secondary | ICD-10-CM | POA: Insufficient documentation

## 2017-04-12 DIAGNOSIS — I10 Essential (primary) hypertension: Secondary | ICD-10-CM

## 2017-04-12 DIAGNOSIS — Z7901 Long term (current) use of anticoagulants: Secondary | ICD-10-CM | POA: Insufficient documentation

## 2017-04-12 DIAGNOSIS — Z841 Family history of disorders of kidney and ureter: Secondary | ICD-10-CM | POA: Insufficient documentation

## 2017-04-12 DIAGNOSIS — I5031 Acute diastolic (congestive) heart failure: Secondary | ICD-10-CM

## 2017-04-12 DIAGNOSIS — I272 Pulmonary hypertension, unspecified: Secondary | ICD-10-CM | POA: Insufficient documentation

## 2017-04-12 DIAGNOSIS — Z836 Family history of other diseases of the respiratory system: Secondary | ICD-10-CM | POA: Insufficient documentation

## 2017-04-12 DIAGNOSIS — I2510 Atherosclerotic heart disease of native coronary artery without angina pectoris: Secondary | ICD-10-CM | POA: Insufficient documentation

## 2017-04-12 DIAGNOSIS — E118 Type 2 diabetes mellitus with unspecified complications: Secondary | ICD-10-CM

## 2017-04-12 DIAGNOSIS — R42 Dizziness and giddiness: Secondary | ICD-10-CM | POA: Insufficient documentation

## 2017-04-12 DIAGNOSIS — Z794 Long term (current) use of insulin: Secondary | ICD-10-CM

## 2017-04-12 DIAGNOSIS — I110 Hypertensive heart disease with heart failure: Secondary | ICD-10-CM | POA: Insufficient documentation

## 2017-04-12 LAB — BASIC METABOLIC PANEL
Anion gap: 11 (ref 5–15)
BUN: 42 mg/dL — AB (ref 6–20)
CALCIUM: 9 mg/dL (ref 8.9–10.3)
CHLORIDE: 94 mmol/L — AB (ref 101–111)
CO2: 27 mmol/L (ref 22–32)
CREATININE: 1.71 mg/dL — AB (ref 0.61–1.24)
GFR calc Af Amer: 48 mL/min — ABNORMAL LOW (ref >60)
GFR calc non Af Amer: 42 mL/min — ABNORMAL LOW (ref >60)
Glucose, Bld: 152 mg/dL — ABNORMAL HIGH (ref 65–99)
Potassium: 4 mmol/L (ref 3.5–5.1)
SODIUM: 132 mmol/L — AB (ref 135–145)

## 2017-04-12 MED ORDER — TORSEMIDE 20 MG PO TABS: 40.0000 mg | ORAL_TABLET | Freq: Every day | ORAL | 3 refills | Status: DC

## 2017-04-12 MED ORDER — POTASSIUM CHLORIDE CRYS ER 20 MEQ PO TBCR: 20.0000 meq | EXTENDED_RELEASE_TABLET | Freq: Every day | ORAL | 3 refills | Status: DC

## 2017-04-12 MED FILL — TORSEMIDE 20 MG TABLET: 20 | 30 days supply | Qty: 60 | Fill #0

## 2017-04-12 NOTE — Patient Instructions (Signed)
Labs today (will call for abnormal results, otherwise no news is good news)   DECREASE torsemide 40 mg (2 Tablets) Once Daily  Start taking potassium 20 mEq Once Daily, stop taking the KCL ER.   STOP taking glipizide  Follow up in 2 Months

## 2017-04-12 NOTE — Progress Notes (Signed)
Advanced Heart Failure Clinic Note   PCP: Dr. Venetia Mclaughlin  Primary Cardiologist: Dr. Shirlee Mclaughlin   HPI: [name] is a 61 year old male with a past medical history of chronic diastolic CHF, atrial fibrillation, DM, and congenital heart disease. He was seen in Dr. Jenene Mclaughlin's office on 03/16/17 and set up for a right and left heart cath with concerns for type I pulmonary HTN.   He was admitted after right and left heart cath with elevated filling pressures and severe pulmonary HTN. Results below. It was predominantly pulmonary venous HTN with PVR 3.1 WU). Also with 75% stenosis in the distal LAD. He was started on IV diuresis and diuresed 34 pounds total. Transitioned to po torsemide 60 mg daily (had previously been on Lasix) at home. During his hospitalization he was noted to have transient, asymptomatic episodes of bradycardia. His metoprolol was reduced to 25 mg BID. Discharge weight was 276 pounds.   He returns today for HF follow up. Overall feeling very well. Weight down 10 pound since discharge and he says at times he feels dizzy. Dizziness usually occurs when he changes positions. He was seen by the internal medicine clinic last week and his glipizide dose was reduced by half. He was instructed to stop taking it if he had hypoglycemic episodes. He has been eating well, following low salt diet. Drinking less than 2L a day. No syncope or presyncope. Denies SOB with his daily activities, orthopnea and PND.   Right/Left Heart Cath and Coronary Angiography 03/30/17  1. Markedly elevated right and left heart filling pressures.  2. Severe pulmonary hypertension, but likely predominantly pulmonary venous hypertension with PVR 3.1 WU.  3. Most significant coronary lesion was 75% distal LAD stenosis, but vessel is small caliber at this point.  Doubt this contributes to his symptoms.  Diabetic-appearing vessels with diffuse disease.   RA mean 23 RV 90/23 PA 94/35, mean 56 PCWP mean 31 LV 155/31 AO  151/86  Oxygen saturations: RA 66% RV 65% PA 61% AO 98%  Cardiac Output (Fick) 8.03  Cardiac Index (Fick) 3.09 PVR 3.1 WU     Review of Systems: [y] = yes, [ ]  = no   General: Weight gain [ ] ; Weight loss [ ] ; Anorexia [ ] ; Fatigue [ ] ; Fever [ ] ; Chills [ ] ; Weakness [ y]  Cardiac: Chest pain/pressure [ ] ; Resting SOB [ ] ; Exertional SOB [ ] ; Orthopnea [ ] ; Pedal Edema [ ] ; Palpitations [ ] ; Syncope [ ] ; Presyncope [ ] ; Paroxysmal nocturnal dyspnea[ ]   Pulmonary: Cough [ ] ; Wheezing[ ] ; Hemoptysis[ ] ; Sputum [ ] ; Snoring [ ]   GI: Vomiting[ ] ; Dysphagia[ ] ; Melena[ ] ; Hematochezia [ ] ; Heartburn[ ] ; Abdominal pain [ ] ; Constipation [ ] ; Diarrhea [ ] ; BRBPR [ ]   GU: Hematuria[ ] ; Dysuria [ ] ; Nocturia[ ]   Vascular: Pain in legs with walking [ ] ; Pain in feet with lying flat [ ] ; Non-healing sores [ ] ; Stroke [ ] ; TIA [ ] ; Slurred speech [ ] ;  Neuro: Headaches[ ] ; Vertigo[ ] ; Seizures[ ] ; Paresthesias[ ] ;Blurred vision [ ] ; Diplopia [ ] ; Vision changes [ ]   Ortho/Skin: Arthritis [ y]; Joint pain [ ] ; Muscle pain [ ] ; Joint swelling [ ] ; Back Pain [ ] ; Rash [ ]   Psych: Depression[ ] ; Anxiety[ ]   Heme: Bleeding problems [ ] ; Clotting disorders [ ] ; Anemia [ ]   Endocrine: Diabetes [place].Etienne[y ]; Thyroid dysfunction[ ]    Past Medical History:  Diagnosis Date  . Atrial fibrillation (HCC)   .  Cellulitis and abscess of left leg 06/2016  . CHF (congestive heart failure) (HCC)   . Diabetes (HCC)   . Dyspnea     Current Outpatient Prescriptions  Medication Sig Dispense Refill  . albuterol (PROVENTIL HFA;VENTOLIN HFA) 108 (90 Base) MCG/ACT inhaler Inhale 2 puffs into the lungs every 6 (six) hours as needed for wheezing or shortness of breath. 1 Inhaler 0  . atorvastatin (LIPITOR) 20 MG tablet Take 1 tablet (20 mg total) by mouth daily. 30 tablet 3  . gabapentin (NEURONTIN) 300 MG capsule TAKE 1 CAPSULE BY MOUTH 2 TIMES DAILY. 60 capsule 3  . glipiZIDE (GLUCOTROL) 5 MG tablet Take 0.5 tablets  (2.5 mg total) by mouth 2 (two) times daily before a meal. 30 tablet 3  . hydrALAZINE (APRESOLINE) 50 MG tablet Take 1.5 tablets (75 mg total) by mouth 3 (three) times daily. 90 tablet 11  . lisinopril (PRINIVIL,ZESTRIL) 40 MG tablet Take 1 tablet (40 mg total) by mouth daily. 30 tablet 3  . metFORMIN (GLUCOPHAGE) 500 MG tablet TAKE 1 TABLET BY MOUTH 2 TIMES DAILY WITH A MEAL. 60 tablet 3  . metoprolol tartrate (LOPRESSOR) 50 MG tablet Take 0.5 tablets (25 mg total) by mouth 2 (two) times daily. 60 tablet 3  . rivaroxaban (XARELTO) 20 MG TABS tablet Take 1 tablet (20 mg total) by mouth daily with supper. 90 tablet 3  . torsemide (DEMADEX) 20 MG tablet Take 3 tablets (60 mg total) by mouth daily. 90 tablet 6  . traZODone (DESYREL) 100 MG tablet Take 1 tablet (100 mg total) by mouth at bedtime as needed for sleep. 30 tablet 3  . potassium chloride SA (K-DUR,KLOR-CON) 20 MEQ tablet Take 1 tablet (20 mEq total) by mouth daily. (Patient not taking: Reported on 04/12/2017) 30 tablet 3   No current facility-administered medications for this encounter.     No Known Allergies    Social History   Social History  . Marital status: Divorced    Spouse name: N/A  . Number of children: 1  . Years of education: N/A   Occupational History  . Not on file.   Social History Main Topics  . Smoking status: Never Smoker  . Smokeless tobacco: Never Used  . Alcohol use No     Comment: gave up drinking ~20 years ago  . Drug use: No     Comment: not since the 1st of January  . Sexual activity: Not on file   Other Topics Concern  . Not on file   Social History Narrative   Lives alone       Family History  Problem Relation Age of Onset  . Diabetes Mellitus II Mother        ESRD  . Diabetes Mellitus II Brother   . Emphysema Father     Vitals:   04/12/17 1457  BP: (!) 106/50  Weight: 272 lb 8 oz (123.6 kg)     PHYSICAL EXAM: General: Fatigued appearing male, NAD. Walked into clinic without  difficulty.  HEENT: normal Neck: supple. no JVD. Carotids 2+ bilat; no bruits. No lymphadenopathy or thyromegaly appreciated. Cor: PMI nondisplaced. Regular rate & rhythm. No rubs, gallops or murmurs. Lungs: clear bilaterally. Normal effort.  Abdomen: soft, nontender, nondistended. No hepatosplenomegaly. No bruits or masses. Good bowel sounds. Extremities: no cyanosis, clubbing, rash. No edema.  Neuro: alert & oriented x 3, cranial nerves grossly intact. moves all 4 extremities w/o difficulty. Affect pleasant.     ASSESSMENT & PLAN: 1. Pulmonary  hypertension: Primarily pulmonary venous hypertension, WHO Group 2.  - RHC results above.  - PFT's 04/10/17 - DLCO 59%.  - Sleep study ordered.   2. Chronic diastolic HF: Echo in 3/18 with LV EF 60-65%, RV severely dilated with moderate to severely decreased systolic function. Filling pressures markedly elevated on RHC. - NYHA II  - With dizziness and weight down 10 pounds, will reduce torsemide to 40 mg daily.  - Continue metoprolol 25 mg BID.  - Encouraged him to continue to weigh himself daily. He does well with his diet, but reminded him that he needs to follow a low sodium diet.   3. Moderate non-obstructive CAD: 75% distal LAD lesion  - Medical management with atorvastatin.  - No ASA with need for Xarelto and stable CAD  4. Chronic Afib:  - Rate controlled - Continue Xarelto for anticoagulation.   5. Congenital Heart Disease:  Underwent surgical repair at age 26. He is not sure what type of congenital issue he had. 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    6. DM2:  - he says he feels like his blood sugars are low, diaphoretic at times. Will have him stop glipizide per PCP's instructions from last week.   7. HTN - Stable on current regimen.   BMET, follow up in 2 months.   Paul Ishikawa, NP 04/12/17

## 2017-04-13 MED FILL — TRUE METRIX TEST STRIP: 25 days supply | Qty: 100 | Fill #3

## 2017-04-14 ENCOUNTER — Telehealth: Payer: Self-pay | Admitting: *Deleted

## 2017-04-14 NOTE — Telephone Encounter (Signed)
Left message on voicemail to return call .   Notes recorded by Jaclyn ShaggyAmao, Enobong, MD on 04/11/2017 at 1:31 PM EDT Lung test reveals moderately severe obstructive airway disease in keeping with emphysema. Please continue current inhalers.

## 2017-04-20 ENCOUNTER — Ambulatory Visit: Payer: Self-pay | Admitting: Cardiology

## 2017-04-24 NOTE — Telephone Encounter (Signed)
Left message on voicemail to return call.

## 2017-04-25 MED FILL — ?ATORVASTATIN 20 MG TABLET: 20 | 30 days supply | Qty: 30 | Fill #1

## 2017-04-27 MED FILL — XARELTO 20 MG TABLET: 20 | 30 days supply | Qty: 30 | Fill #9

## 2017-04-27 MED FILL — POTASSIUM CL ER 20 MEQ TAB: 20 | 30 days supply | Qty: 30 | Fill #0

## 2017-04-27 NOTE — Telephone Encounter (Signed)
Left message on voicemail for patient to call back. Will mail letter to patient to call office for results.

## 2017-05-01 MED FILL — ?METFORMIN HCL 500MG TABLET: 500 | 30 days supply | Qty: 60 | Fill #0

## 2017-05-16 MED FILL — GABAPENTIN 300 MG CAPSULE: 300 | 30 days supply | Qty: 60 | Fill #1

## 2017-05-18 MED FILL — LISINOPRIL 40 MG TABLET: 40 | 30 days supply | Qty: 30 | Fill #1

## 2017-05-18 MED FILL — ?METOPROLOL 50 MG TABLET: 50 | 30 days supply | Qty: 60 | Fill #2

## 2017-05-19 ENCOUNTER — Ambulatory Visit: Payer: Self-pay | Admitting: Family Medicine

## 2017-05-22 MED FILL — XARELTO 20 MG TABLET: 20 | 30 days supply | Qty: 30 | Fill #10

## 2017-05-22 MED FILL — POTASSIUM CL ER 20 MEQ TAB: 20 | 30 days supply | Qty: 30 | Fill #1

## 2017-05-22 MED FILL — ATORVASTATIN 20 MG TABLET: 20 | 30 days supply | Qty: 30 | Fill #2

## 2017-05-22 NOTE — Progress Notes (Signed)
Cardiology Office Note   Date:  05/24/2017   ID:  Paul Mclaughlin, DOB 22-Aug-1956, MRN [id number]  PCP:  Paul Shaggy, MD  Cardiologist:   Paul Rotunda, MD   Chief Complaint  Patient presents with  . Shortness of Breath      History of Present Illness: Paul Mclaughlin is a 61 y.o. male who presents for follow up of atrial fib    An echo earlier this year demonstrated NL LV function, moderate to severely increased pulmonary pressure, mild to moderate TR, severe RAE and moderate LAE.  I sent him for a right and left heart cath.  This demonstrated elevated LV filling pressures and severe pulmonary HTN. It was predominantly pulmonary venous HTN with PVR 3.1 WU.   He also had 75% stenosis in the distal LAD. He was started on IV diuresis and diuresed 34 pounds total. He was transitioned to PO torsemide 60 mg daily. During his hospitalization he was noted to have transient, asymptomatic episodes of bradycardia. His metoprolol was reduced to 25 mg BID.   He returns for follow up.  He continues to do much better.  His weight is staying in the mid 260s at home.  He tries to look at his salt.  At the CHF appt he had an increased BUN/Creat and had his Torsemide reduced.  His breathing has been OK despite that and his leg edema is much improved.  The patient denies any new symptoms such as chest discomfort, neck or arm discomfort. There has been no new shortness of breath, PND or orthopnea. There have been no reported palpitations, presyncope or syncope.   Past Medical History:  Diagnosis Date  . Atrial fibrillation (HCC)   . Cellulitis and abscess of left leg 06/2016  . CHF (congestive heart failure) (HCC)   . Diabetes (HCC)   . Dyspnea     Past Surgical History:  Procedure Laterality Date  . AMPUTATION Left 06/24/2016   Procedure: FOOT FIFTH RAY TOE AMPUTATION;  Surgeon: Paul Mustard, MD;  Location: [facility] OR;  Service: Orthopedics;  Laterality: Left;  . open heart surgery     As a child.   [age] years old.  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   [name] RIGHT/LEFT HEART CATH AND CORONARY ANGIOGRAPHY N/A 03/30/2017   Procedure: Right/Left Heart Cath and Coronary Angiography;  Surgeon: Paul Morale, MD;  Location: [facility] INVASIVE CV LAB;  Service: Cardiovascular;  Laterality: N/A;     Current Outpatient Prescriptions  Medication Sig Dispense Refill  . albuterol (PROVENTIL HFA;VENTOLIN HFA) 108 (90 Base) MCG/ACT inhaler Inhale 2 puffs into the lungs every 6 (six) hours as needed for wheezing or shortness of breath. 1 Inhaler 0  . atorvastatin (LIPITOR) 20 MG tablet Take 1 tablet (20 mg total) by mouth daily. 30 tablet 3  . gabapentin (NEURONTIN) 300 MG capsule TAKE 1 CAPSULE BY MOUTH 2 TIMES DAILY. 60 capsule 3  . glipiZIDE (GLUCOTROL) 5 MG tablet TAKE 1 2 TABLET BY MOUTH 2 TIMES DAILY BEFORE A MEAL  3  . hydrALAZINE (APRESOLINE) 50 MG tablet Take 1.5 tablets (75 mg total) by mouth 3 (three) times daily. 90 tablet 11  . lisinopril (PRINIVIL,ZESTRIL) 40 MG tablet Take 1 tablet (40 mg total) by mouth daily. 30 tablet 3  . metFORMIN (GLUCOPHAGE) 500 MG tablet TAKE 1 TABLET BY MOUTH 2 TIMES DAILY WITH A MEAL. 60 tablet 3  . metoprolol tartrate (LOPRESSOR) 50 MG tablet Take 0.5 tablets (25 mg total) by mouth 2 (two)  times daily. 60 tablet 3  . potassium chloride SA (K-DUR,KLOR-CON) 20 MEQ tablet Take 1 tablet (20 mEq total) by mouth daily. 30 tablet 3  . rivaroxaban (XARELTO) 20 MG TABS tablet Take 1 tablet (20 mg total) by mouth daily with supper. 90 tablet 3  . torsemide (DEMADEX) 20 MG tablet Take 2 tablets (40 mg total) by mouth daily. 60 tablet 3  . traZODone (DESYREL) 100 MG tablet Take 1 tablet (100 mg total) by mouth at bedtime as needed for sleep. 30 tablet 3   No current facility-administered medications for this visit.     Allergies:   Patient has no known allergies.    ROS:  Please see the history of present illness.   Otherwise, review of systems are positive for none.   All other systems are  reviewed and negative.    PHYSICAL EXAM: VS:  BP (!) 141/65   Pulse (!) 57   Ht  (1.854 m)   Wt 272 lb 9.6 oz (123.7 kg)   BMI 35.97 kg/m  , BMI Body mass index is 35.97 kg/m.  GENERAL:  Well appearing NECK:  7 cm jugular venous distention, waveform within normal limits, carotid upstroke brisk and symmetric, no bruits, no thyromegaly LUNGS:  Clear to auscultation bilaterally CHEST:  Unremarkable HEART:  PMI not displaced or sustained,S1 and S2 within normal limits, no S3, no S4, no clicks, no rubs, no murmurs ABD:  Flat, positive bowel sounds normal in frequency in pitch, no bruits, no rebound, no guarding, no midline pulsatile mass, no hepatomegaly, no splenomegaly EXT:  2 plus pulses throughout, trace edema, no cyanosis no clubbing   EKG:  EKG is not ordered today.   Recent Labs: 06/25/2016: TSH 1.275 11/24/2016: Brain Natriuretic Peptide 301.0 12/26/2016: ALT 16 04/01/2017: Magnesium 2.0 04/04/2017: Hemoglobin 9.6; Platelets 260 05/23/2017: BUN 38; Creatinine, Ser 1.37; Potassium 4.7; Sodium 136    Lipid Panel    Component Value Date/Time   CHOL 131 06/25/2016 0452   TRIG 149 06/25/2016 0452   HDL 15 (L) 06/25/2016 0452   CHOLHDL 8.7 06/25/2016 0452   VLDL 30 06/25/2016 0452   LDLCALC 86 06/25/2016 0452     Lab Results  Component Value Date   HGBA1C 5.4 02/08/2017    Wt Readings from Last 3 Encounters:  05/23/17 272 lb 9.6 oz (123.7 kg)  04/12/17 272 lb 8 oz (123.6 kg)  04/06/17 277 lb (125.6 kg)      Other studies Reviewed: Additional studies/ records that were reviewed today include:  Hospital and CHF records. Review of the above records demonstrates:     ASSESSMENT AND PLAN:   Pulmonary HTN:  This seems to be mostly related to his diastolic dysfunction and seems to be improved by exam and is much better clinically. I will check a basic metabolic profile today. Otherwise no change in therapy.  DM:   A1c is well controlled. He'll continue the meds  as listed.  CAD:  The patient has no new sypmtoms.  No further cardiovascular testing is indicated.  We will continue with aggressive risk reduction and meds as listed.  CKD:  I will repeat a BMET  HTN:  BP is marginally elevated but has been low.  I will not titrate meds at this appt.    Persistent Atrial Fib:  Mr. LEVII HAIRFIELD has a CHA2DS2 - VASc score of 2.   He remains on Xarelto.  No change in therapy.   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  No change in therapy.   Current medicines are reviewed at length with the patient today.  The patient does not have concerns regarding medicines.  The following changes have been made:  None  Labs/ tests ordered today include:    Orders Placed This Encounter  Procedures  . Basic Metabolic Panel (BMET)     Disposition:   FU with me after he has seen CHF clinic.  I would be happy to follow with him if CHF clinic schedules this.    Signed, Paul RotundaJames Kessler Kopinski, MD  05/24/2017 9:09 AM    [facility]

## 2017-05-23 ENCOUNTER — Encounter: Payer: Self-pay | Admitting: Cardiology

## 2017-05-23 ENCOUNTER — Ambulatory Visit (INDEPENDENT_AMBULATORY_CARE_PROVIDER_SITE_OTHER): Payer: Self-pay | Admitting: Cardiology

## 2017-05-23 VITALS — BP 141/65 | HR 57 | Ht 73.0 in | Wt 272.6 lb

## 2017-05-23 DIAGNOSIS — I4819 Other persistent atrial fibrillation: Secondary | ICD-10-CM

## 2017-05-23 DIAGNOSIS — N183 Chronic kidney disease, stage 3 unspecified: Secondary | ICD-10-CM

## 2017-05-23 DIAGNOSIS — Z79899 Other long term (current) drug therapy: Secondary | ICD-10-CM

## 2017-05-23 DIAGNOSIS — I481 Persistent atrial fibrillation: Secondary | ICD-10-CM

## 2017-05-23 DIAGNOSIS — I5032 Chronic diastolic (congestive) heart failure: Secondary | ICD-10-CM

## 2017-05-23 DIAGNOSIS — I272 Pulmonary hypertension, unspecified: Secondary | ICD-10-CM

## 2017-05-23 NOTE — Patient Instructions (Addendum)
Medication Instructions:  Continue current medications  Labwork: BMP Today  Testing/Procedures: None Ordered  Follow-Up: Your physician recommends that you schedule a follow-up appointment in: Keep appointment with Dr Shirlee LatchMclean on October 1st at 3:00 pm   Any Other Special Instructions Will Be Listed Below (If Applicable).   If you need a refill on your cardiac medications before your next appointment, please call your pharmacy.

## 2017-05-24 ENCOUNTER — Encounter: Payer: Self-pay | Admitting: Cardiology

## 2017-05-24 DIAGNOSIS — N183 Chronic kidney disease, stage 3 (moderate): Secondary | ICD-10-CM

## 2017-05-24 DIAGNOSIS — N1831 Chronic kidney disease, stage 3a: Secondary | ICD-10-CM | POA: Insufficient documentation

## 2017-05-24 LAB — BASIC METABOLIC PANEL
BUN/Creatinine Ratio: 28 — ABNORMAL HIGH (ref 10–24)
BUN: 38 mg/dL — ABNORMAL HIGH (ref 8–27)
CHLORIDE: 98 mmol/L (ref 96–106)
CO2: 25 mmol/L (ref 20–29)
Calcium: 9.6 mg/dL (ref 8.6–10.2)
Creatinine, Ser: 1.37 mg/dL — ABNORMAL HIGH (ref 0.76–1.27)
GFR, EST AFRICAN AMERICAN: 64 mL/min/{1.73_m2} (ref >59)
GFR, EST NON AFRICAN AMERICAN: 56 mL/min/{1.73_m2} — AB (ref >59)
Glucose: 164 mg/dL — ABNORMAL HIGH (ref 65–99)
POTASSIUM: 4.7 mmol/L (ref 3.5–5.2)
SODIUM: 136 mmol/L (ref 134–144)

## 2017-05-29 ENCOUNTER — Ambulatory Visit: Payer: Self-pay | Attending: Family Medicine | Admitting: Family Medicine

## 2017-05-29 VITALS — BP 117/71 | HR 62 | Temp 98.2°F | Ht 73.0 in | Wt 273.8 lb

## 2017-05-29 DIAGNOSIS — E1149 Type 2 diabetes mellitus with other diabetic neurological complication: Secondary | ICD-10-CM

## 2017-05-29 DIAGNOSIS — Z7984 Long term (current) use of oral hypoglycemic drugs: Secondary | ICD-10-CM | POA: Insufficient documentation

## 2017-05-29 DIAGNOSIS — I2510 Atherosclerotic heart disease of native coronary artery without angina pectoris: Secondary | ICD-10-CM | POA: Insufficient documentation

## 2017-05-29 DIAGNOSIS — I10 Essential (primary) hypertension: Secondary | ICD-10-CM

## 2017-05-29 DIAGNOSIS — Z7901 Long term (current) use of anticoagulants: Secondary | ICD-10-CM | POA: Insufficient documentation

## 2017-05-29 DIAGNOSIS — Z89432 Acquired absence of left foot: Secondary | ICD-10-CM | POA: Insufficient documentation

## 2017-05-29 DIAGNOSIS — I739 Peripheral vascular disease, unspecified: Secondary | ICD-10-CM

## 2017-05-29 DIAGNOSIS — E118 Type 2 diabetes mellitus with unspecified complications: Secondary | ICD-10-CM

## 2017-05-29 DIAGNOSIS — I110 Hypertensive heart disease with heart failure: Secondary | ICD-10-CM | POA: Insufficient documentation

## 2017-05-29 DIAGNOSIS — Z794 Long term (current) use of insulin: Secondary | ICD-10-CM

## 2017-05-29 DIAGNOSIS — E1151 Type 2 diabetes mellitus with diabetic peripheral angiopathy without gangrene: Secondary | ICD-10-CM | POA: Insufficient documentation

## 2017-05-29 DIAGNOSIS — I5032 Chronic diastolic (congestive) heart failure: Secondary | ICD-10-CM | POA: Insufficient documentation

## 2017-05-29 DIAGNOSIS — I4819 Other persistent atrial fibrillation: Secondary | ICD-10-CM

## 2017-05-29 DIAGNOSIS — IMO0002 Reserved for concepts with insufficient information to code with codable children: Secondary | ICD-10-CM

## 2017-05-29 DIAGNOSIS — G4700 Insomnia, unspecified: Secondary | ICD-10-CM | POA: Insufficient documentation

## 2017-05-29 DIAGNOSIS — I272 Pulmonary hypertension, unspecified: Secondary | ICD-10-CM | POA: Insufficient documentation

## 2017-05-29 DIAGNOSIS — G4709 Other insomnia: Secondary | ICD-10-CM

## 2017-05-29 DIAGNOSIS — I481 Persistent atrial fibrillation: Secondary | ICD-10-CM | POA: Insufficient documentation

## 2017-05-29 LAB — POCT GLYCOSYLATED HEMOGLOBIN (HGB A1C): HEMOGLOBIN A1C: 5.8

## 2017-05-29 LAB — GLUCOSE, POCT (MANUAL RESULT ENTRY): POC Glucose: 97 mg/dl (ref 70–99)

## 2017-05-29 MED ORDER — ATORVASTATIN CALCIUM 20 MG PO TABS: 20.0000 mg | ORAL_TABLET | Freq: Every day | ORAL | 3 refills | Status: DC

## 2017-05-29 MED ORDER — LISINOPRIL 40 MG PO TABS: 40.0000 mg | ORAL_TABLET | Freq: Every day | ORAL | 3 refills | Status: DC

## 2017-05-29 MED ORDER — METFORMIN HCL 500 MG PO TABS: ORAL_TABLET | ORAL | 3 refills | Status: DC

## 2017-05-29 MED ORDER — GABAPENTIN 300 MG PO CAPS: ORAL_CAPSULE | ORAL | 3 refills | Status: DC

## 2017-05-29 MED ORDER — TRAZODONE HCL 100 MG PO TABS: 100.0000 mg | ORAL_TABLET | Freq: Every evening | ORAL | 3 refills | Status: DC | PRN

## 2017-05-29 MED FILL — ?METFORMIN HCL 500MG TABLET: 500 | 30 days supply | Qty: 90 | Fill #0

## 2017-05-29 MED FILL — traZODone HCL 100 MG TABS: 100 | 30 days supply | Qty: 30 | Fill #0

## 2017-05-29 NOTE — Patient Instructions (Signed)
Hypoglycemia Hypoglycemia is when the sugar (glucose) level in the blood is too low. Symptoms of low blood sugar may include:  Feeling: ? Hungry. ? Worried or nervous (anxious). ? Sweaty and clammy. ? Confused. ? Dizzy. ? Sleepy. ? Sick to your stomach (nauseous).  Having: ? A fast heartbeat. ? A headache. ? A change in your vision. ? Jerky movements that you cannot control (seizure). ? Nightmares. ? Tingling or no feeling (numbness) around the mouth, lips, or tongue.  Having trouble with: ? Talking. ? Paying attention (concentrating). ? Moving (coordination). ? Sleeping.  Shaking.  Passing out (fainting).  Getting upset easily (irritability).  Low blood sugar can happen to people who have diabetes and people who do not have diabetes. Low blood sugar can happen quickly, and it can be an emergency. Treating Low Blood Sugar Low blood sugar is often treated by eating or drinking something sugary right away. If you can think clearly and swallow safely, follow the 15:15 rule:  Take 15 grams of a fast-acting carb (carbohydrate). Some fast-acting carbs are: ? 1 tube of glucose gel. ? 3 sugar tablets (glucose pills). ? 6-8 pieces of hard candy. ? 4 oz (120 mL) of fruit juice. ? 4 oz (120 mL) of regular (not diet) soda.  Check your blood sugar 15 minutes after you take the carb.  If your blood sugar is still at or below 70 mg/dL (3.9 mmol/L), take 15 grams of a carb again.  If your blood sugar does not go above 70 mg/dL (3.9 mmol/L) after 3 tries, get help right away.  After your blood sugar goes back to normal, eat a meal or a snack within 1 hour.  Treating Very Low Blood Sugar If your blood sugar is at or below 54 mg/dL (3 mmol/L), you have very low blood sugar (severe hypoglycemia). This is an emergency. Do not wait to see if the symptoms will go away. Get medical help right away. Call your local emergency services (911 in the [place] Do not drive yourself to the  hospital. If you have very low blood sugar and you cannot eat or drink, you may need a glucagon shot (injection). A family member or friend should learn how to check your blood sugar and how to give you a glucagon shot. Ask your doctor if you need to have a glucagon shot kit at home. Follow these instructions at home: General instructions  Avoid any diets that cause you to not eat enough food. Talk with your doctor before you start any new diet.  Take over-the-counter and prescription medicines only as told by your doctor.  Limit alcohol to no more than 1 drink per day for nonpregnant women and 2 drinks per day for men. One drink equals 12 oz of beer, 5 oz of wine, or 1 oz of hard liquor.  Keep all follow-up visits as told by your doctor. This is important. If You Have Diabetes:   Make sure you know the symptoms of low blood sugar.  Always keep a source of sugar with you, such as: ? Sugar. ? Sugar tablets. ? Glucose gel. ? Fruit juice. ? Regular soda (not diet soda). ? Milk. ? Hard candy. ? Honey.  Take your medicines as told.  Follow your exercise and meal plan. ? Eat on time. Do not skip meals. ? Follow your sick day plan when you cannot eat or drink normally. Make this plan ahead of time with your doctor.  Check your blood sugar as often   as told by your doctor. Always check before and after exercise.  Share your diabetes care plan with: ? Your work or school. ? People you live with.  Check your pee (urine) for ketones: ? When you are sick. ? As told by your doctor.  Carry a card or wear jewelry that says you have diabetes. If You Have Low Blood Sugar From Other Causes:   Check your blood sugar as often as told by your doctor.  Follow instructions from your doctor about what you cannot eat or drink. Contact a doctor if:  You have trouble keeping your blood sugar in your target range.  You have low blood sugar often. Get help right away if:  You still have  symptoms after you eat or drink something sugary.  Your blood sugar is at or below 54 mg/dL (3 mmol/L).  You have jerky movements that you cannot control.  You pass out. These symptoms may be an emergency. Do not wait to see if the symptoms will go away. Get medical help right away. Call your local emergency services (911 in the [place] Do not drive yourself to the hospital. This information is not intended to replace advice given to you by your health care provider. Make sure you discuss any questions you have with your health care provider. Document Released: 11/23/2009 Document Revised: 02/04/2016 Document Reviewed: 10/02/2015 Elsevier Interactive Patient Education  [employer].

## 2017-05-30 ENCOUNTER — Encounter: Payer: Self-pay | Admitting: Family Medicine

## 2017-05-30 NOTE — Progress Notes (Signed)
Subjective:  Patient ID: Paul Mclaughlin, male    DOB: [birth year]  Age: 61 y.o. MRN: [id number]  CC: Congestive Heart Failure and Diabetes   HPI Paul Mclaughlin  is a 61 year old male with a history of type 2 diabetes mellitus (A1c 5.8), hypertension, atrial fibrillation (on anticoagulation with Xarelto and rate control with metoprolol), history of ?congenital heart disease status post surgery,Pulmonary hypertension, congestive heart failure ( EF of 55-60% from 2-D echo 11/2016).  He had a LHC/RHC on 03/2017 which revealed left main 25% distal stenosis, 50% proximal LAD stenosis, 75% distal LAD stenosis, 50% LCx posterior stenosis small OM 2, RCA luminal irregularities. Markedly elevated right and left heart filling pressures, severe pulmonary hypertension likely pulmonary versus hypertension with PVR of 3.12 WU. He denies shortness of breath, chest pains, pedal edema. Last visit to cardiology was last week   With regards to his diabetes mellitus he denies hypoglycemia. Continues to see white spots in his visual field which disappear when he doesn't take glipizide. He denies numbness in extremities. Tolerating all of the medications and denies side effects.   Past Medical History:  Diagnosis Date  . Atrial fibrillation (HCC)   . Cellulitis and abscess of left leg 06/2016  . CHF (congestive heart failure) (HCC)   . Diabetes (HCC)   . Dyspnea     Past Surgical History:  Procedure Laterality Date  . AMPUTATION Left 06/24/2016   Procedure: FOOT FIFTH RAY TOE AMPUTATION;  Surgeon: [name], MD;  Location: [facility] OR;  Service: Orthopedics;  Laterality: Left;  . open heart surgery     As a child.  [age] years old.  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   [name] RIGHT/LEFT HEART CATH AND CORONARY ANGIOGRAPHY N/A 03/30/2017   Procedure: Right/Left Heart Cath and Coronary Angiography;  Surgeon: [name], MD;  Location: [facility] INVASIVE CV LAB;  Service: Cardiovascular;  Laterality: N/A;    No Known  Allergies  Outpatient Medications Prior to Visit  Medication Sig Dispense Refill  . albuterol (PROVENTIL HFA;VENTOLIN HFA) 108 (90 Base) MCG/ACT inhaler Inhale 2 puffs into the lungs every 6 (six) hours as needed for wheezing or shortness of breath. 1 Inhaler 0  . hydrALAZINE (APRESOLINE) 50 MG tablet Take 1.5 tablets (75 mg total) by mouth 3 (three) times daily. 90 tablet 11  . metoprolol tartrate (LOPRESSOR) 50 MG tablet Take 0.5 tablets (25 mg total) by mouth 2 (two) times daily. 60 tablet 3  . potassium chloride SA (K-DUR,KLOR-CON) 20 MEQ tablet Take 1 tablet (20 mEq total) by mouth daily. 30 tablet 3  . rivaroxaban (XARELTO) 20 MG TABS tablet Take 1 tablet (20 mg total) by mouth daily with supper. 90 tablet 3  . torsemide (DEMADEX) 20 MG tablet Take 2 tablets (40 mg total) by mouth daily. 60 tablet 3  . atorvastatin (LIPITOR) 20 MG tablet Take 1 tablet (20 mg total) by mouth daily. 30 tablet 3  . gabapentin (NEURONTIN) 300 MG capsule TAKE 1 CAPSULE BY MOUTH 2 TIMES DAILY. 60 capsule 3  . glipiZIDE (GLUCOTROL) 5 MG tablet TAKE 1 2 TABLET BY MOUTH 2 TIMES DAILY BEFORE A MEAL  3  . lisinopril (PRINIVIL,ZESTRIL) 40 MG tablet Take 1 tablet (40 mg total) by mouth daily. 30 tablet 3  . metFORMIN (GLUCOPHAGE) 500 MG tablet TAKE 1 TABLET BY MOUTH 2 TIMES DAILY WITH A MEAL. 60 tablet 3  . traZODone (DESYREL) 100 MG tablet Take 1 tablet (100 mg total) by mouth at bedtime as needed for  sleep. 30 tablet 3   No facility-administered medications prior to visit.     ROS Review of Systems  Constitutional: Negative for activity change and appetite change.  HENT: Negative for sinus pressure and sore throat.   Eyes: Negative for visual disturbance.  Respiratory: Negative for cough, chest tightness and shortness of breath.   Cardiovascular: Negative for chest pain and leg swelling.  Gastrointestinal: Negative for abdominal distention, abdominal pain, constipation and diarrhea.  Endocrine: Negative.    Genitourinary: Negative for dysuria.  Musculoskeletal: Negative for joint swelling and myalgias.  Skin: Negative for rash.  Allergic/Immunologic: Negative.   Neurological: Negative for weakness, light-headedness and numbness.  Psychiatric/Behavioral: Negative for dysphoric mood and suicidal ideas.    Objective:  BP 117/71   Pulse 62   Temp 98.2 F (36.8 C) (Oral)   Ht  (1.854 m)   Wt 273 lb 12.8 oz (124.2 kg)   SpO2 97%   BMI 36.12 kg/m   BP/Weight 05/29/2017 05/23/2017 04/12/2017  Systolic BP 117 141 106  Diastolic BP 71 65 50  Wt. (Lbs) 273.8 272.6 272.5  BMI 36.12 35.97 35.95      Physical Exam  Constitutional: He is oriented to person, place, and time. He appears well-developed and well-nourished.  Cardiovascular: Normal rate and normal heart sounds.   No murmur heard. Unable to palpate dorsalis pedis and posterior tibialis  Pulmonary/Chest: Effort normal and breath sounds normal. He has no wheezes. He has no rales. He exhibits no tenderness.  Abdominal: Soft. Bowel sounds are normal. He exhibits no distension and no mass. There is no tenderness.  Musculoskeletal: Normal range of motion.  Neurological: He is alert and oriented to person, place, and time.  Skin:  Left lateral foot ulcer with minimal drainage Scab on right posterior leg.     Lab Results  Component Value Date   HGBA1C 5.8 05/29/2017    Assessment & Plan:   1. Type 2 diabetes mellitus with complication, without long-term current use of insulin (HCC) Controlled with A1c of 5.8 Diabetic diet - POCT glucose (manual entry) - POCT glycosylated hemoglobin (Hb A1C) - metFORMIN (GLUCOPHAGE) 500 MG tablet; TAKE 2 TABLET BY MOUTH IN THE MORNING AND 1 TABLET IN THE EVENING.  Dispense: 90 tablet; Refill: 3 - atorvastatin (LIPITOR) 20 MG tablet; Take 1 tablet (20 mg total) by mouth daily.  Dispense: 30 tablet; Refill: 3  2. Pulmonary HTN (HCC) Managed by Cardiology  3. Essential  hypertension Controlled Low sodium diet - lisinopril (PRINIVIL,ZESTRIL) 40 MG tablet; Take 1 tablet (40 mg total) by mouth daily.  Dispense: 30 tablet; Refill: 3  4. Persistent atrial fibrillation (HCC) CHADS2VASC score of 5 (CHF, HTN, Afib,DM, PAD) Anticoagulation with Xarelto, rate control with Metoprolol)  5. Type 2 diabetes mellitus with other neurologic complication, without long-term current use of insulin (HCC) Controlled - gabapentin (NEURONTIN) 300 MG capsule; TAKE 1 CAPSULE BY MOUTH 2 TIMES DAILY.  Dispense: 60 capsule; Refill: 3  6. Other insomnia Controlled Continue sleep hygiene - traZODone (DESYREL) 100 MG tablet; Take 1 tablet (100 mg total) by mouth at bedtime as needed for sleep.  Dispense: 30 tablet; Refill: 3  7. Foot amputation status, left (HCC) Dressing change performed in the clinic He is high risk for amputations - Ambulatory referral to Vascular Surgery  8. Chronic diastolic CHF (congestive heart failure) (HCC) EF 55-60% from 11/2016 Euvolemic Low sodium, heart healthy diet, limit fluid intake, daily weights  9. Peripheral arterial disease (HCC) In office ABI revealed  PAD on RLE - Ambulatory referral to Vascular Surgery   Meds ordered this encounter  Medications  . metFORMIN (GLUCOPHAGE) 500 MG tablet    Sig: TAKE 2 TABLET BY MOUTH IN THE MORNING AND 1 TABLET IN THE EVENING.    Dispense:  90 tablet    Refill:  3    Discontinue Glipizide  . gabapentin (NEURONTIN) 300 MG capsule    Sig: TAKE 1 CAPSULE BY MOUTH 2 TIMES DAILY.    Dispense:  60 capsule    Refill:  3  . traZODone (DESYREL) 100 MG tablet    Sig: Take 1 tablet (100 mg total) by mouth at bedtime as needed for sleep.    Dispense:  30 tablet    Refill:  3  . atorvastatin (LIPITOR) 20 MG tablet    Sig: Take 1 tablet (20 mg total) by mouth daily.    Dispense:  30 tablet    Refill:  3  . lisinopril (PRINIVIL,ZESTRIL) 40 MG tablet    Sig: Take 1 tablet (40 mg total) by mouth daily.     Dispense:  30 tablet    Refill:  3    Discontinue previous dose    Follow-up: Return in about 3 months (around 08/28/2017) for Follow-up on chronic medical condition.   Jaclyn Shaggy MD

## 2017-05-31 ENCOUNTER — Encounter: Payer: Self-pay | Admitting: *Deleted

## 2017-06-08 LAB — POCT ABI - SCREENING FOR PILOT NO CHARGE

## 2017-06-08 NOTE — Addendum Note (Signed)
Addended by: Margaretmary Lombard on: 06/08/2017 03:24 PM   Modules accepted: Orders

## 2017-06-12 ENCOUNTER — Ambulatory Visit (HOSPITAL_COMMUNITY)
Admission: RE | Admit: 2017-06-12 | Discharge: 2017-06-12 | Disposition: A | Discharge status: Home / Self Care | Payer: Self-pay | Source: Ambulatory Visit | Attending: Cardiology | Admitting: Cardiology

## 2017-06-12 ENCOUNTER — Encounter (HOSPITAL_COMMUNITY): Payer: Self-pay | Admitting: Cardiology

## 2017-06-12 VITALS — BP 132/75 | HR 64 | Wt 270.2 lb

## 2017-06-12 DIAGNOSIS — I2510 Atherosclerotic heart disease of native coronary artery without angina pectoris: Secondary | ICD-10-CM | POA: Insufficient documentation

## 2017-06-12 DIAGNOSIS — I272 Pulmonary hypertension, unspecified: Secondary | ICD-10-CM | POA: Insufficient documentation

## 2017-06-12 DIAGNOSIS — I481 Persistent atrial fibrillation: Secondary | ICD-10-CM

## 2017-06-12 DIAGNOSIS — I4819 Other persistent atrial fibrillation: Secondary | ICD-10-CM

## 2017-06-12 DIAGNOSIS — I482 Chronic atrial fibrillation: Secondary | ICD-10-CM | POA: Insufficient documentation

## 2017-06-12 DIAGNOSIS — I5032 Chronic diastolic (congestive) heart failure: Secondary | ICD-10-CM | POA: Insufficient documentation

## 2017-06-12 DIAGNOSIS — Z79899 Other long term (current) drug therapy: Secondary | ICD-10-CM | POA: Insufficient documentation

## 2017-06-12 DIAGNOSIS — I110 Hypertensive heart disease with heart failure: Secondary | ICD-10-CM | POA: Insufficient documentation

## 2017-06-12 DIAGNOSIS — E119 Type 2 diabetes mellitus without complications: Secondary | ICD-10-CM | POA: Insufficient documentation

## 2017-06-12 DIAGNOSIS — Z7901 Long term (current) use of anticoagulants: Secondary | ICD-10-CM | POA: Insufficient documentation

## 2017-06-12 DIAGNOSIS — Z7984 Long term (current) use of oral hypoglycemic drugs: Secondary | ICD-10-CM | POA: Insufficient documentation

## 2017-06-12 DIAGNOSIS — Z833 Family history of diabetes mellitus: Secondary | ICD-10-CM | POA: Insufficient documentation

## 2017-06-12 DIAGNOSIS — Q249 Congenital malformation of heart, unspecified: Secondary | ICD-10-CM | POA: Insufficient documentation

## 2017-06-12 NOTE — Patient Instructions (Signed)
Your physician recommends that you schedule a follow-up appointment in: 2 months  

## 2017-06-12 NOTE — Progress Notes (Signed)
Advanced Heart Failure Clinic Note   PCP: Dr. Venetia Night  Primary Cardiologist: Dr. Shirlee Latch   HPI: Paul Mclaughlin is a 61 year old male with a past medical history of chronic diastolic CHF, atrial fibrillation, DM, and congenital heart disease. He was seen in Dr. Jenene Slicker office on 03/16/17 and set up for a right and left heart cath with concerns for type I pulmonary HTN.   He was admitted after right and left heart cath with elevated filling pressures and severe pulmonary HTN. Results below. It was predominantly pulmonary venous HTN with PVR 3.1 WU). Also with 75% stenosis in the distal LAD. He was started on IV diuresis and diuresed 34 pounds total. Transitioned to po torsemide 60 mg daily (had previously been on Lasix) at home. During his hospitalization he was noted to have transient, asymptomatic episodes of bradycardia. His metoprolol was reduced to 25 mg BID. Discharge weight was 276 pounds.   He returns today for regular follow. Last visit torsemide decreased due to orthopnea. Weight down 2 lbs from last  Visit. Still having mild lightheadedness with rapid standing, but is not marked or limiting. Usually worse first thing in the morning, before medicine. Watching fluid and salt. No SOB with daily activities.  May get mild SOB if he gets in a hurry or up an incline. Denies SOB getting around the grocery store. Taking all medication as directed. Taking torsemide 20 mg BID. Denies edema.   Right/Left Heart Cath and Coronary Angiography 03/30/17  1. Markedly elevated right and left heart filling pressures.  2. Severe pulmonary hypertension, but likely predominantly pulmonary venous hypertension with PVR 3.1 WU.  3. Most significant coronary lesion was 75% distal LAD stenosis, but vessel is small caliber at this point.  Doubt this contributes to his symptoms.  Diabetic-appearing vessels with diffuse disease.   RA mean 23 RV 90/23 PA 94/35, mean 56 PCWP mean 31 LV 155/31 AO 151/86  Oxygen  saturations: RA 66% RV 65% PA 61% AO 98%  Cardiac Output (Fick) 8.03  Cardiac Index (Fick) 3.09 PVR 3.1 WU  Labs (8/18): creatinine 1.71 Labs (9/18): K 4.7, creatinine 1.37  Past Medical History:  Diagnosis Date  . Atrial fibrillation (HCC)   . Cellulitis and abscess of left leg 06/2016  . CHF (congestive heart failure) (HCC)   . Diabetes (HCC)   . Dyspnea    Current Outpatient Prescriptions  Medication Sig Dispense Refill  . albuterol (PROVENTIL HFA;VENTOLIN HFA) 108 (90 Base) MCG/ACT inhaler Inhale 2 puffs into the lungs every 6 (six) hours as needed for wheezing or shortness of breath. 1 Inhaler 0  . atorvastatin (LIPITOR) 20 MG tablet Take 1 tablet (20 mg total) by mouth daily. 30 tablet 3  . gabapentin (NEURONTIN) 300 MG capsule TAKE 1 CAPSULE BY MOUTH 2 TIMES DAILY. 60 capsule 3  . hydrALAZINE (APRESOLINE) 50 MG tablet Take 1.5 tablets (75 mg total) by mouth 3 (three) times daily. 90 tablet 11  . lisinopril (PRINIVIL,ZESTRIL) 40 MG tablet Take 1 tablet (40 mg total) by mouth daily. 30 tablet 3  . metFORMIN (GLUCOPHAGE) 500 MG tablet TAKE 2 TABLET BY MOUTH IN THE MORNING AND 1 TABLET IN THE EVENING. 90 tablet 3  . metoprolol tartrate (LOPRESSOR) 50 MG tablet Take 0.5 tablets (25 mg total) by mouth 2 (two) times daily. 60 tablet 3  . potassium chloride SA (K-DUR,KLOR-CON) 20 MEQ tablet Take 1 tablet (20 mEq total) by mouth daily. 30 tablet 3  . rivaroxaban (XARELTO) 20 MG TABS  tablet Take 1 tablet (20 mg total) by mouth daily with supper. 90 tablet 3  . torsemide (DEMADEX) 20 MG tablet Take 2 tablets (40 mg total) by mouth daily. 60 tablet 3  . traZODone (DESYREL) 100 MG tablet Take 1 tablet (100 mg total) by mouth at bedtime as needed for sleep. 30 tablet 3   No current facility-administered medications for this encounter.    No Known Allergies  Social History   Social History  . Marital status: Divorced    Spouse name: N/A  . Number of children: 1  . Years of  education: N/A   Occupational History  . Not on file.   Social History Main Topics  . Smoking status: Never Smoker  . Smokeless tobacco: Never Used  . Alcohol use No     Comment: gave up drinking ~20 years ago  . Drug use: No     Comment: not since the 1st of January  . Sexual activity: Not on file   Other Topics Concern  . Not on file   Social History Narrative   Lives alone     Family History  Problem Relation Age of Onset  . Diabetes Mellitus II Mother        ESRD  . Diabetes Mellitus II Brother   . Emphysema Father    Vitals:   06/12/17 1458  BP: 132/75  Pulse: 64  SpO2: 99%  Weight: 270 lb 4 oz (122.6 kg)   Wt Readings from Last 3 Encounters:  06/12/17 270 lb 4 oz (122.6 kg)  05/29/17 273 lb 12.8 oz (124.2 kg)  05/23/17 272 lb 9.6 oz (123.7 kg)    PHYSICAL EXAM:  General: Well appearing. No resp difficulty. HEENT: Normal Neck: Supple. JVP 5-6. Carotids 2+ bilat; no bruits. No thyromegaly or nodule noted. Cor: PMI nondisplaced. RRR, No M/G/R noted Lungs: CTAB, normal effort. Abdomen: Soft, non-tender, non-distended, no HSM. No bruits or masses. +BS  Extremities: No cyanosis, clubbing, rash, R and LLE no edema.  Neuro: Alert & orientedx3, cranial nerves grossly intact. moves all 4 extremities w/o difficulty. Affect pleasant   ASSESSMENT & PLAN: 1. Pulmonary hypertension: Primarily pulmonary venous hypertension, WHO Group 2.  - RHC results 03/30/17 as above.   - PFT's 04/10/17 - DLCO 59%.  - Sleep study ordered.   2. Chronic diastolic HF: Echo in 3/18 with LV EF 60-65%, RV severely dilated with moderate to severely decreased systolic function. Filling pressures markedly elevated on RHC. - NYHA II symptoms.  - Continue torsemide 40 mg daily - Continue metoprolol 25 mg BID.  - Reinforced fluid restriction to < 2 L daily, sodium restriction to less than 2000 mg daily, and the importance of daily weights.    3. Moderate non-obstructive CAD: 75% distal LAD  lesion  - Medical management with atorvastatin.  - No s/s of ischemia.  - No ASA with need for Xarelto and stable CAD  4. Chronic Afib:  - Rate controlled. No change.  - Continue Xarelto for anticoagulation. Denies bleeding.   5. Congenital Heart Disease:  Underwent surgical repair at age 53. He is not sure what type of congenital issue he had. 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  RHC in 7/18 did not suggest residual defect.   6. DM2:  - Per PCP. No further hypoglycemia with med adjustments.  - Occasionally still taking glipizide as needed. Encouraged to stop and discuss with PCP.   7. HTN - Stable on meds as  above.   Doing well overall. RTC 2 months to make sure fluid status maintained on lower torsemide dose.   Paul Freer, Paul Mclaughlin 06/12/17   Patient seen with PA, agree with the above note.  Weight is down another 2 lbs.  He seems to be doing well overall, NYHA class II. Volume status looks good on current diuretic, will continue without change.  Creatinine improved on last BMET.   If symptoms worsen/volume overload redevelops, he would be a good [facility] candidate.    Followup in 2 months.   Paul Mclaughlin 06/13/2017

## 2017-06-13 MED FILL — TORSEMIDE 20 MG TABLET: 20 | 30 days supply | Qty: 60 | Fill #1

## 2017-06-16 MED FILL — ?ATORVASTATIN 20 MG TABLET: 20 | 30 days supply | Qty: 30 | Fill #3

## 2017-06-16 MED FILL — LISINOPRIL 40 MG TABLET: 40 | 30 days supply | Qty: 30 | Fill #2

## 2017-06-16 MED FILL — POTASSIUM CL ER 20 MEQ TAB: 20 | 30 days supply | Qty: 30 | Fill #2

## 2017-06-16 MED FILL — ?METOPROLOL 50 MG TABLET: 50 | 30 days supply | Qty: 60 | Fill #3

## 2017-06-16 MED FILL — XARELTO 20 MG TABLET: 20 | 30 days supply | Qty: 30 | Fill #11

## 2017-06-16 MED FILL — GABAPENTIN 300 MG CAPSULE: 300 | 30 days supply | Qty: 60 | Fill #2

## 2017-06-21 MED FILL — hydrALAZINE HCL 50 MG TABS: 50 | 20 days supply | Qty: 90 | Fill #1

## 2017-07-03 MED FILL — ?METFORMIN HCL 500MG TABLET: 500 | 30 days supply | Qty: 90 | Fill #1

## 2017-07-12 ENCOUNTER — Ambulatory Visit (INDEPENDENT_AMBULATORY_CARE_PROVIDER_SITE_OTHER): Payer: Self-pay | Admitting: Vascular Surgery

## 2017-07-12 ENCOUNTER — Encounter: Payer: Self-pay | Admitting: Vascular Surgery

## 2017-07-12 VITALS — BP 134/66 | HR 98 | Temp 97.9°F | Resp 20 | Ht 73.0 in | Wt 271.4 lb

## 2017-07-12 DIAGNOSIS — I7025 Atherosclerosis of native arteries of other extremities with ulceration: Secondary | ICD-10-CM

## 2017-07-12 NOTE — Progress Notes (Signed)
Requested by:  Jaclyn ShaggyAmao, Enobong, MD [address] [place], [place] [address]  Reason for consultation: right foot ulcer   History of Present Illness   Paul Mclaughlin is a 61 y.o. (1955-12-15) male who presents with chief complaint: right foot ulcer.  Onset of symptoms occurred "some time over the last year".  Pt is unclear of mechanism of injury. 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   The patient notes the ulcer on his R foot has occasionally drained and has had limited pain.  Patient has attempted to treat this pain with rest.  The patient has no rest pain symptoms.  He denies any fever of chills.  Pt has severe pulmonary HTN and 75% distal LAD stenosis.   Atherosclerotic risk factors include: DM.  Past Medical History:  Diagnosis Date  . Atrial fibrillation (HCC)   . Cellulitis and abscess of left leg 06/2016  . CHF (congestive heart failure) (HCC)   . Diabetes (HCC)   . Dyspnea     Past Surgical History:  Procedure Laterality Date  . AMPUTATION Left 06/24/2016   Procedure: FOOT FIFTH RAY TOE AMPUTATION;  Surgeon: Nadara MustardMarcus V Duda, MD;  Location: [facility] OR;  Service: Orthopedics;  Laterality: Left;  . open heart surgery     As a child.  [age] years old.  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   [name]. RIGHT/LEFT HEART CATH AND CORONARY ANGIOGRAPHY N/A 03/30/2017   Procedure: Right/Left Heart Cath and Coronary Angiography;  Surgeon: Laurey MoraleMcLean, Dalton S, MD;  [facility];  Service: Cardiovascular;  Laterality: N/A;    Social History   Social History  . Marital status: Divorced    Spouse name: N/A  . Number of children: 1  . Years of education: N/A   Occupational History  . Not on file.   Social History Main Topics  . Smoking status: Never Smoker  . Smokeless tobacco: Never Used  . Alcohol use No     Comment: gave up drinking ~20 years ago  . Drug use: No     Comment: not since the 1st of January  . Sexual activity: Not on file   Other Topics Concern  . Not on file    Social History Narrative   Lives alone     Family History  Problem Relation Age of Onset  . Diabetes Mellitus II Mother        ESRD  . Diabetes Mellitus II Brother   . Emphysema Father     Current Outpatient Prescriptions  Medication Sig Dispense Refill  . albuterol (PROVENTIL HFA;VENTOLIN HFA) 108 (90 Base) MCG/ACT inhaler Inhale 2 puffs into the lungs every 6 (six) hours as needed for wheezing or shortness of breath. 1 Inhaler 0  . atorvastatin (LIPITOR) 20 MG tablet Take 1 tablet (20 mg total) by mouth daily. 30 tablet 3  . gabapentin (NEURONTIN) 300 MG capsule TAKE 1 CAPSULE BY MOUTH 2 TIMES DAILY. 60 capsule 3  . hydrALAZINE (APRESOLINE) 50 MG tablet Take 1.5 tablets (75 mg total) by mouth 3 (three) times daily. 90 tablet 11  . lisinopril (PRINIVIL,ZESTRIL) 40 MG tablet Take 1 tablet (40 mg total) by mouth daily. 30 tablet 3  . metFORMIN (GLUCOPHAGE) 500 MG tablet TAKE 2 TABLET BY MOUTH IN THE MORNING AND 1 TABLET IN THE EVENING. 90 tablet 3  . metoprolol tartrate (LOPRESSOR) 50 MG tablet Take 0.5 tablets (25 mg total) by mouth 2 (two) times daily. 60 tablet 3  . potassium chloride  SA (K-DUR,KLOR-CON) 20 MEQ tablet Take 1 tablet (20 mEq total) by mouth daily. 30 tablet 3  . rivaroxaban (XARELTO) 20 MG TABS tablet Take 1 tablet (20 mg total) by mouth daily with supper. 90 tablet 3  . torsemide (DEMADEX) 20 MG tablet Take 2 tablets (40 mg total) by mouth daily. 60 tablet 3  . traZODone (DESYREL) 100 MG tablet Take 1 tablet (100 mg total) by mouth at bedtime as needed for sleep. 30 tablet 3   No current facility-administered medications for this visit.     No Known Allergies  REVIEW OF SYSTEMS (negative unless checked):   Cardiac:  []  Chest pain or chest pressure? []  Shortness of breath upon activity? []  Shortness of breath when lying flat? [x]  Irregular heart rhythm?  Vascular:  [x]  Pain in calf, thigh, or hip brought on by walking? []  Pain in feet at night that wakes  you up from your sleep? []  Blood clot in your veins? []  Leg swelling?  Pulmonary:  []  Oxygen at home? []  Productive cough? []  Wheezing?  Neurologic:  []  Sudden weakness in arms or legs? []  Sudden numbness in arms or legs? []  Sudden onset of difficult speaking or slurred speech? []  Temporary loss of vision in one eye? []  Problems with dizziness?  Gastrointestinal:  []  Blood in stool? []  Vomited blood?  Genitourinary:  []  Burning when urinating? []  Blood in urine?  Psychiatric:  []  Major depression  Hematologic:  []  Bleeding problems? []  Problems with blood clotting?  Dermatologic:  []  Rashes or ulcers?  Constitutional:  []  Fever or chills?  Ear/Nose/Throat:  []  Change in hearing? []  Nose bleeds? []  Sore throat?  Musculoskeletal:  []  Back pain? []  Joint pain? []  Muscle pain?   For VQI Use Only   PRE-ADM LIVING Home  AMB STATUS Ambulatory  CAD Sx None  PRIOR CHF None  STRESS TEST No    Physical Examination     Vitals:   07/12/17 1229  BP: 134/66  Pulse: 98  Resp: 20  Temp: 97.9 F (36.6 C)  TempSrc: Oral  SpO2: 97%  Weight: 271 lb 6.4 oz (123.1 kg)  Height: 6\' 1"  (1.854 m)   Body mass index is 35.81 kg/m.  General Alert, O x 3, WD, NAD  Head [place]/AT,    Ear/Nose/ Throat Hearing grossly intact, nares without erythema or drainage, oropharynx without Erythema or Exudate, Mallampati score: 3,   Eyes PERRLA, EOMI,    Neck Supple, mid-line trachea,    Pulmonary Sym exp, good B air movt, CTA B  Cardiac RRR, Nl S1, S2, no Murmurs, No rubs, No S3,S4  Vascular Vessel Right Left  Radial Palpable Palpable  Brachial Palpable Palpable  Carotid Palpable, No Bruit Palpable, No Bruit  Aorta Not palpable N/A  Femoral Palpable Palpable  Popliteal Not palpable Not palpable  PT Not palpable Not palpable  DP Faintly palpable Not palpable    Gastro- intestinal soft, non-distended, non-tender to palpation, No guarding or rebound, no HSM, no masses, no  CVAT B, No palpable prominent aortic pulse,    Musculo- skeletal M/S 5/5 throughout  , Extremities without ischemic changes except L 5th toe amputation with some vesicular appearance with some serous drainage, skin overlying plantar surface of R 1st MT has an ulcer with black eschar, some serous drainage was expressed, Non-pitting edema present: 1+, Varicosities present: B small, spider veins in feet, Lipodermatosclerosis present: mild B LDS  Neurologic Cranial nerves 2-12 intact , Pain and light touch intact in extremities ,  Motor exam as listed above  Psychiatric Judgement intact, Mood & affect appropriate for pt's clinical situation  Dermatologic See M/S exam for extremity exam, No rashes otherwise noted  Lymphatic  Palpable lymph nodes: None    Outside Studies/Documentation   2 pages of outside documents were reviewed including: outpatient PCP.   Medical Decision Making   CELESTE CANDELAS is a 61 y.o. male who presents with: BLE critical limb ischemia, DM, severe pulmonary HTN   Non-invasive studies are likely to be inaccurate in this patient.  Would proceed with angiographic studies.  I discussed with the patient the natural history of critical limb ischemia: 25% require amputation in one year, 50% are able to maintain their limbs in one year, and 25-30% die in one year due to comorbidities.  Given the limb threatening status of this patient, I recommend an aggressive work up including proceeding with an: Aortogram, Bilateral runoff and possible intervention. I discussed with the patient the nature of angiographic procedures, especially the limited patencies of any endovascular intervention. The patient is aware of that the risks of an angiographic procedure include but are not limited to: bleeding, infection, access site complications, embolization, rupture of treated vessel, dissection, possible need for emergent surgical intervention, and possible need for surgical procedures to treat  the patient's pathology.  Patient wants to try wound management with Dr. [name] first.  I will refer him back to Dr. [name] for evaluation of both feet.  Will have him follow up in 2 months.  I discussed in depth with the patient the nature of atherosclerosis, and emphasized the importance of maximal medical management including strict control of blood pressure, blood glucose, and lipid levels, antiplatelet agents, obtaining regular exercise, and cessation of smoking.  The patient is aware that without maximal medical management the underlying atherosclerotic disease process will progress, limiting the benefit of any interventions. The patient is currently on a statin: Lipitor.  The patient is currently not on an anti-platelet due to bleeding risks related to use of an anticoagulant.  Patient is on also Xarelto.  Thank you for allowing [place] to participate in this patient's care.   Paul Sake, MD, FACS [facility] Office: [phone] Pager: [phone]  07/12/2017, 1:04 PM

## 2017-07-14 MED FILL — TORSEMIDE 20 MG TABLET: 20 | 30 days supply | Qty: 60 | Fill #2

## 2017-07-18 ENCOUNTER — Other Ambulatory Visit: Payer: Self-pay | Admitting: Family Medicine

## 2017-07-18 DIAGNOSIS — S98132A Complete traumatic amputation of one left lesser toe, initial encounter: Secondary | ICD-10-CM

## 2017-07-18 DIAGNOSIS — E118 Type 2 diabetes mellitus with unspecified complications: Secondary | ICD-10-CM

## 2017-07-18 DIAGNOSIS — I10 Essential (primary) hypertension: Secondary | ICD-10-CM

## 2017-07-18 MED FILL — POTASSIUM CL ER 20 MEQ TAB: 20 | 30 days supply | Qty: 30 | Fill #3

## 2017-07-18 MED FILL — LISINOPRIL 40 MG TABS: 40 | 30 days supply | Qty: 30 | Fill #3

## 2017-07-18 MED FILL — ATORVASTATIN 20 MG TABLET: 20 | 30 days supply | Qty: 30 | Fill #0

## 2017-07-18 MED FILL — GABAPENTIN 300 MG CAPSULE: 300 | 30 days supply | Qty: 60 | Fill #3

## 2017-07-18 MED FILL — hydrALAZINE HCL 50 MG TABS: 50 | 20 days supply | Qty: 90 | Fill #2

## 2017-07-18 MED FILL — ?METOPROLOL 50 MG TABLET: 50 | 30 days supply | Qty: 60 | Fill #0

## 2017-07-19 MED FILL — XARELTO 20 MG TABLET: 20 | 7 days supply | Qty: 7 | Fill #0

## 2017-07-21 ENCOUNTER — Telehealth (HOSPITAL_COMMUNITY): Payer: Self-pay | Admitting: Cardiology

## 2017-07-21 NOTE — Telephone Encounter (Signed)
Left message for patient to call back, need to reschedule 08/18/17 appt with Dr. McLean °

## 2017-07-24 ENCOUNTER — Encounter: Payer: Self-pay | Admitting: Podiatry

## 2017-07-24 ENCOUNTER — Telehealth: Payer: Self-pay | Admitting: Family Medicine

## 2017-07-24 DIAGNOSIS — E1165 Type 2 diabetes mellitus with hyperglycemia: Secondary | ICD-10-CM

## 2017-07-24 NOTE — Telephone Encounter (Signed)
Patient came in to request a referral to a podiatrist Please follow up.

## 2017-07-26 NOTE — Addendum Note (Signed)
Addended by: Jaclyn ShaggyAMAO, Terrel Manalo on: 07/26/2017 01:42 PM   Modules accepted: Orders

## 2017-07-26 NOTE — Telephone Encounter (Signed)
Referral has been placed. 

## 2017-07-29 NOTE — Progress Notes (Signed)
This encounter was created in error - please disregard.

## 2017-08-01 MED FILL — ?METFORMIN HCL 500MG TABLET: 500 | 30 days supply | Qty: 90 | Fill #2

## 2017-08-02 ENCOUNTER — Ambulatory Visit: Payer: Self-pay | Attending: Family Medicine

## 2017-08-07 MED FILL — XARELTO 20 MG TABLET: 20 | 30 days supply | Qty: 30 | Fill #1

## 2017-08-09 ENCOUNTER — Other Ambulatory Visit: Payer: Self-pay | Admitting: Podiatry

## 2017-08-09 ENCOUNTER — Encounter: Payer: Self-pay | Admitting: Podiatry

## 2017-08-09 ENCOUNTER — Ambulatory Visit: Payer: No Typology Code available for payment source

## 2017-08-09 ENCOUNTER — Ambulatory Visit: Payer: Self-pay | Admitting: Podiatry

## 2017-08-09 DIAGNOSIS — I70235 Atherosclerosis of native arteries of right leg with ulceration of other part of foot: Secondary | ICD-10-CM

## 2017-08-09 DIAGNOSIS — M79671 Pain in right foot: Secondary | ICD-10-CM

## 2017-08-09 DIAGNOSIS — L97529 Non-pressure chronic ulcer of other part of left foot with unspecified severity: Secondary | ICD-10-CM

## 2017-08-09 DIAGNOSIS — E0843 Diabetes mellitus due to underlying condition with diabetic autonomic (poly)neuropathy: Secondary | ICD-10-CM

## 2017-08-09 DIAGNOSIS — I70245 Atherosclerosis of native arteries of left leg with ulceration of other part of foot: Secondary | ICD-10-CM

## 2017-08-09 DIAGNOSIS — L97522 Non-pressure chronic ulcer of other part of left foot with fat layer exposed: Secondary | ICD-10-CM

## 2017-08-09 DIAGNOSIS — L97512 Non-pressure chronic ulcer of other part of right foot with fat layer exposed: Secondary | ICD-10-CM

## 2017-08-13 NOTE — Progress Notes (Signed)
   Subjective:  61 year old male with PMHx of T2DM presenting today as a new patient with a chief complaint of a lesion to the right forefoot that has been present for approximately one year. He reports associated purulent, yellow drainage. He has been applying Silvadene cream to the area for treatment. There are no modifying factors noted. Patient presents today for further treatment and evaluation.   Past Medical History:  Diagnosis Date  . Atrial fibrillation (HCC)   . Cellulitis and abscess of left leg 06/2016  . CHF (congestive heart failure) (HCC)   . Diabetes (HCC)   . Dyspnea       Objective/Physical Exam General: The patient is alert and oriented x3 in no acute distress.  Dermatology:  Wound #1 noted to the right foot measuring approximately 1.3 x 0.3 x 0.2 cm (LxWxD).   To the noted ulceration(s), there is no eschar. There is a moderate amount of slough, fibrin, and necrotic tissue noted. Granulation tissue and wound base is red. There is a minimal amount of serosanguineous drainage noted. There is no exposed bone muscle-tendon ligament or joint. There is no malodor. Periwound integrity is intact. Skin is warm, dry and supple bilateral lower extremities.  Vascular: Palpable pedal pulses bilaterally. No edema or erythema noted. Capillary refill within normal limits.  Neurological: Epicritic and protective threshold absent bilaterally.   Musculoskeletal Exam: Range of motion within normal limits to all pedal and ankle joints bilateral. Muscle strength 5/5 in all groups bilateral.   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   Assessment: #1 ulceration of the right foot secondary to diabetes mellitus #2 diabetes mellitus w/ peripheral neuropathy   Plan of Care:  #1 Patient was evaluated. X-Rays reviewed.  #2 medically necessary excisional debridement including subcutaneous tissue was performed using a tissue nipper and a chisel blade.  Excisional debridement of all the necrotic nonviable tissue down to healthy bleeding viable tissue was performed with post-debridement measurements same as pre-. #3 the wound was cleansed and dry sterile dressing applied. #4 Continue using Silvadene cream and dry sterile dressing daily. Dressing supplies dispensed.  #5 Return to clinic when necessary.   Felecia ShellingBrent M. Evans, DPM [facility]  Dr. Felecia ShellingBrent M. Evans, DPM    [address]                                        [place], [place] [address]                Office [phone]  Fax [phone]

## 2017-08-15 MED FILL — TORSEMIDE 20 MG TABLET: 20 | 30 days supply | Qty: 60 | Fill #3

## 2017-08-17 ENCOUNTER — Telehealth (HOSPITAL_COMMUNITY): Payer: Self-pay | Admitting: Cardiology

## 2017-08-17 NOTE — Telephone Encounter (Signed)
Called and left VM for patient to call back.  Need to reschedule 08/21/17 appt with Dr. Shirlee LatchMcLean d/t inclement weather.  We can reschedule to 08/23/17 in the afternoon with Dr. Shirlee LatchMcLean.

## 2017-08-18 ENCOUNTER — Encounter (HOSPITAL_COMMUNITY): Payer: Self-pay | Admitting: Cardiology

## 2017-08-21 ENCOUNTER — Encounter (HOSPITAL_COMMUNITY): Payer: Self-pay | Admitting: Cardiology

## 2017-08-21 ENCOUNTER — Other Ambulatory Visit: Payer: Self-pay | Admitting: Family Medicine

## 2017-08-21 DIAGNOSIS — I10 Essential (primary) hypertension: Secondary | ICD-10-CM

## 2017-08-23 MED FILL — GABAPENTIN 300 MG CAPSULE: 300 | 30 days supply | Qty: 60 | Fill #0

## 2017-08-23 MED FILL — POTASSIUM CL ER 20 MEQ TAB: 20 | 30 days supply | Qty: 30 | Fill #0

## 2017-08-23 MED FILL — LISINOPRIL 40 MG TAB: 40 | 30 days supply | Qty: 30 | Fill #0

## 2017-08-25 MED FILL — ?ATORVASTATIN 20 MG TABLET: 20 | 30 days supply | Qty: 30 | Fill #1

## 2017-08-25 MED FILL — ?METOPROLOL 50 MG TABLET: 50 | 30 days supply | Qty: 60 | Fill #1

## 2017-08-29 ENCOUNTER — Encounter: Payer: Self-pay | Admitting: Family Medicine

## 2017-08-29 ENCOUNTER — Ambulatory Visit: Payer: Self-pay | Attending: Family Medicine | Admitting: Family Medicine

## 2017-08-29 VITALS — BP 128/68 | HR 55 | Temp 98.2°F | Ht 73.0 in | Wt 277.8 lb

## 2017-08-29 DIAGNOSIS — I739 Peripheral vascular disease, unspecified: Secondary | ICD-10-CM

## 2017-08-29 DIAGNOSIS — E1151 Type 2 diabetes mellitus with diabetic peripheral angiopathy without gangrene: Secondary | ICD-10-CM | POA: Insufficient documentation

## 2017-08-29 DIAGNOSIS — Z7901 Long term (current) use of anticoagulants: Secondary | ICD-10-CM | POA: Insufficient documentation

## 2017-08-29 DIAGNOSIS — Z794 Long term (current) use of insulin: Secondary | ICD-10-CM

## 2017-08-29 DIAGNOSIS — E118 Type 2 diabetes mellitus with unspecified complications: Secondary | ICD-10-CM

## 2017-08-29 DIAGNOSIS — I110 Hypertensive heart disease with heart failure: Secondary | ICD-10-CM | POA: Insufficient documentation

## 2017-08-29 DIAGNOSIS — L97529 Non-pressure chronic ulcer of other part of left foot with unspecified severity: Secondary | ICD-10-CM | POA: Insufficient documentation

## 2017-08-29 DIAGNOSIS — E11621 Type 2 diabetes mellitus with foot ulcer: Secondary | ICD-10-CM | POA: Insufficient documentation

## 2017-08-29 DIAGNOSIS — I4819 Other persistent atrial fibrillation: Secondary | ICD-10-CM

## 2017-08-29 DIAGNOSIS — I2510 Atherosclerotic heart disease of native coronary artery without angina pectoris: Secondary | ICD-10-CM | POA: Insufficient documentation

## 2017-08-29 DIAGNOSIS — S98132A Complete traumatic amputation of one left lesser toe, initial encounter: Secondary | ICD-10-CM

## 2017-08-29 DIAGNOSIS — Z7984 Long term (current) use of oral hypoglycemic drugs: Secondary | ICD-10-CM | POA: Insufficient documentation

## 2017-08-29 DIAGNOSIS — I5031 Acute diastolic (congestive) heart failure: Secondary | ICD-10-CM | POA: Insufficient documentation

## 2017-08-29 DIAGNOSIS — Z79899 Other long term (current) drug therapy: Secondary | ICD-10-CM | POA: Insufficient documentation

## 2017-08-29 DIAGNOSIS — I272 Pulmonary hypertension, unspecified: Secondary | ICD-10-CM | POA: Insufficient documentation

## 2017-08-29 DIAGNOSIS — E1140 Type 2 diabetes mellitus with diabetic neuropathy, unspecified: Secondary | ICD-10-CM | POA: Insufficient documentation

## 2017-08-29 DIAGNOSIS — E1149 Type 2 diabetes mellitus with other diabetic neurological complication: Secondary | ICD-10-CM

## 2017-08-29 DIAGNOSIS — Z89422 Acquired absence of other left toe(s): Secondary | ICD-10-CM | POA: Insufficient documentation

## 2017-08-29 DIAGNOSIS — I481 Persistent atrial fibrillation: Secondary | ICD-10-CM | POA: Insufficient documentation

## 2017-08-29 DIAGNOSIS — I10 Essential (primary) hypertension: Secondary | ICD-10-CM

## 2017-08-29 LAB — GLUCOSE, POCT (MANUAL RESULT ENTRY): POC Glucose: 242 mg/dl — AB (ref 70–99)

## 2017-08-29 LAB — POCT GLYCOSYLATED HEMOGLOBIN (HGB A1C): HEMOGLOBIN A1C: 7.8

## 2017-08-29 MED ORDER — TORSEMIDE 20 MG PO TABS: 40.0000 mg | ORAL_TABLET | Freq: Every day | ORAL | 3 refills | Status: DC

## 2017-08-29 MED ORDER — METFORMIN HCL 500 MG PO TABS: 1000.0000 mg | ORAL_TABLET | Freq: Two times a day (BID) | ORAL | 3 refills | Status: DC

## 2017-08-29 MED ORDER — GABAPENTIN 300 MG PO CAPS: ORAL_CAPSULE | ORAL | 3 refills | Status: DC

## 2017-08-29 MED ORDER — GLIPIZIDE 5 MG PO TABS: 2.5000 mg | ORAL_TABLET | Freq: Two times a day (BID) | ORAL | 3 refills | Status: DC

## 2017-08-29 MED ORDER — METOPROLOL TARTRATE 50 MG PO TABS: 50.0000 mg | ORAL_TABLET | Freq: Two times a day (BID) | ORAL | 3 refills | Status: DC

## 2017-08-29 MED ORDER — POTASSIUM CHLORIDE CRYS ER 20 MEQ PO TBCR: 20.0000 meq | EXTENDED_RELEASE_TABLET | Freq: Every day | ORAL | 3 refills | Status: DC

## 2017-08-29 MED ORDER — ATORVASTATIN CALCIUM 20 MG PO TABS: 20.0000 mg | ORAL_TABLET | Freq: Every day | ORAL | 3 refills | Status: DC

## 2017-08-29 MED ORDER — RIVAROXABAN 20 MG PO TABS: ORAL_TABLET | ORAL | 3 refills | Status: DC

## 2017-08-29 MED ORDER — LISINOPRIL 40 MG PO TABS: 40.0000 mg | ORAL_TABLET | Freq: Every day | ORAL | 3 refills | Status: DC

## 2017-08-29 MED FILL — ?GLIPIZIDE 5MG TABLET: 5 | 30 days supply | Qty: 30 | Fill #0

## 2017-08-29 MED FILL — XARELTO 20 MG TABLET: 20 | 30 days supply | Qty: 30 | Fill #0

## 2017-08-29 NOTE — Progress Notes (Signed)
Subjective:  Patient ID: Paul Mclaughlin, male    DOB: 19-Jan-1956  Age: 61 y.o. MRN: [id number]  CC: Diabetes   HPI Paul GamesKenneth W Mclaughlin  is a 61 year old male with a history of type 2 diabetes mellitus (A1c 7.8), hypertension, atrial fibrillation (on anticoagulation with Xarelto and rate control with metoprolol), history of ?congenital heart disease status post surgery, Pulmonary hypertension, congestive heart failure ( EF of 55-60% from 2-D echo 11/2016) here for follow-up visit.  Cardiac cath 03/30/17: Conclusion   1. Markedly elevated right and left heart filling pressures.  2. Severe pulmonary hypertension, but likely predominantly pulmonary venous hypertension with PVR 3.1 WU.  3. Most significant coronary lesion was 75% distal LAD stenosis, but vessel is small caliber at this point.  Doubt this contributes to his symptoms.  Diabetic-appearing vessels with diffuse disease.      He informs me his blood sugars have been elevated and this is evident in his A1c which is 7.8 and has increased from 5.8 previously.  He was previously on glipizide which was discontinued due to hypoglycemia as he had complained of blurry vision but now informs me he would like to go back on glipizide as he did have better glycemic control.  He is status post debridement of left foot diabetic ulcer on 08/09/17 by podiatry and reports doing well.  Six weeks ago he was seen by the vascular specialist- Dr Imogene Burnhen due to concern for peripheral arterial disease and the plan was for aortogram and bilateral runoff with possible intervention however the patient informs me he put this on hold as he wanted to have the debridement of his foot also first. He denies any rest pain or intermittent claudication.  With regards to his congestive heart failure his weight has been stable and he denies pedal edema, shortness of breath or chest pains.  Past Medical History:  Diagnosis Date  . Atrial fibrillation (HCC)   . Cellulitis  and abscess of left leg 06/2016  . CHF (congestive heart failure) (HCC)   . Diabetes (HCC)   . Dyspnea     Past Surgical History:  Procedure Laterality Date  . AMPUTATION Left 06/24/2016   Procedure: FOOT FIFTH RAY TOE AMPUTATION;  Surgeon: Nadara MustardMarcus V Duda, MD;  Location: [facility] OR;  Service: Orthopedics;  Laterality: Left;  . open heart surgery     As a child.  [age] years old.  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   [name]. RIGHT/LEFT HEART CATH AND CORONARY ANGIOGRAPHY N/A 03/30/2017   Procedure: Right/Left Heart Cath and Coronary Angiography;  Surgeon: Laurey MoraleMcLean, Dalton S, MD;  [facility];  Service: Cardiovascular;  Laterality: N/A;    No Known Allergies   Outpatient Medications Prior to Visit  Medication Sig Dispense Refill  . albuterol (PROVENTIL HFA;VENTOLIN HFA) 108 (90 Base) MCG/ACT inhaler Inhale 2 puffs into the lungs every 6 (six) hours as needed for wheezing or shortness of breath. 1 Inhaler 0  . hydrALAZINE (APRESOLINE) 50 MG tablet Take 1.5 tablets (75 mg total) by mouth 3 (three) times daily. 90 tablet 11  . atorvastatin (LIPITOR) 20 MG tablet TAKE 1 TABLET BY MOUTH DAILY. 30 tablet 2  . gabapentin (NEURONTIN) 300 MG capsule TAKE 1 CAPSULE BY MOUTH 2 TIMES DAILY. 60 capsule 3  . lisinopril (PRINIVIL,ZESTRIL) 40 MG tablet TAKE 1 TABLET BY MOUTH DAILY. 30 tablet 0  . metFORMIN (GLUCOPHAGE) 500 MG tablet TAKE 2 TABLET BY MOUTH IN THE MORNING AND 1 TABLET IN THE EVENING. 90 tablet 3  .  metoprolol tartrate (LOPRESSOR) 50 MG tablet TAKE 1 TABLET BY MOUTH 2 TIMES DAILY. 60 tablet 2  . potassium chloride SA (K-DUR,KLOR-CON) 20 MEQ tablet Take 1 tablet (20 mEq total) by mouth daily. 30 tablet 3  . torsemide (DEMADEX) 20 MG tablet Take 2 tablets (40 mg total) by mouth daily. 60 tablet 3  . XARELTO 20 MG TABS tablet TAKE 1 TABLET BY MOUTH DAILY WITH SUPPER. 90 tablet 0  . traZODone (DESYREL) 100 MG tablet Take 1 tablet (100 mg total) by mouth at bedtime as needed for sleep. (Patient not taking:  Reported on 08/29/2017) 30 tablet 3   No facility-administered medications prior to visit.     ROS Review of Systems  Constitutional: Negative for activity change and appetite change.  HENT: Negative for sinus pressure and sore throat.   Respiratory: Negative for chest tightness, shortness of breath and wheezing.   Cardiovascular: Negative for chest pain and palpitations.  Gastrointestinal: Negative for abdominal distention, abdominal pain and constipation.  Genitourinary: Negative.   Musculoskeletal: Negative.   Psychiatric/Behavioral: Negative for behavioral problems and dysphoric mood.    Objective:  BP 128/68   Pulse (!) 55   Temp 98.2 F (36.8 C) (Oral)   Ht 6\' 1"  (1.854 m)   Wt 277 lb 12.8 oz (126 kg)   SpO2 97%   BMI 36.65 kg/m   BP/Weight 08/29/2017 07/12/2017 06/12/2017  Systolic BP 128 134 132  Diastolic BP 68 66 75  Wt. (Lbs) 277.8 271.4 270.25  BMI 36.65 35.81 35.66    Wt Readings from Last 3 Encounters:  08/29/17 277 lb 12.8 oz (126 kg)  07/12/17 271 lb 6.4 oz (123.1 kg)  06/12/17 270 lb 4 oz (122.6 kg)     Physical Exam  Constitutional: He is oriented to person, place, and time. He appears well-developed and well-nourished.  Cardiovascular: Normal heart sounds. Bradycardia present.  No murmur heard. Unable to palpate dorsalis pedis bilaterally  Pulmonary/Chest: Effort normal and breath sounds normal. He has no wheezes. He has no rales. He exhibits no tenderness.  Abdominal: Soft. Bowel sounds are normal. He exhibits no distension and no mass. There is no tenderness.  Musculoskeletal: Normal range of motion.  Neurological: He is alert and oriented to person, place, and time.  Skin: Skin is warm and dry.  Psychiatric: He has a normal mood and affect.     Lab Results  Component Value Date   HGBA1C 7.8 08/29/2017     Assessment & Plan:   1. Type 2 diabetes mellitus with complication, without long-term current use of insulin (HCC) Uncontrolled  with A1c of 7.8 Increase dose of metformin Diabetic diet, lifestyle modifications Low-dose glipizide added to regimen (he previously complained glipizide caused blurry vision but would like to try it again) - POCT glucose (manual entry) - POCT glycosylated hemoglobin (Hb A1C) - metFORMIN (GLUCOPHAGE) 500 MG tablet; Take 2 tablets (1,000 mg total) by mouth 2 (two) times daily with a meal. TAKE 2 TABLET BY MOUTH IN THE MORNING AND 1 TABLET IN THE EVENING.  Dispense: 120 tablet; Refill: 3 - glipiZIDE (GLUCOTROL) 5 MG tablet; Take 0.5 tablets (2.5 mg total) by mouth 2 (two) times daily before a meal.  Dispense: 30 tablet; Refill: 3 - atorvastatin (LIPITOR) 20 MG tablet; Take 1 tablet (20 mg total) by mouth daily.  Dispense: 30 tablet; Refill: 3  2. Type 2 diabetes mellitus with other neurologic complication, without long-term current use of insulin (HCC) Controlled - gabapentin (NEURONTIN) 300 MG capsule;  TAKE 1 CAPSULE BY MOUTH 2 TIMES DAILY.  Dispense: 60 capsule; Refill: 3  3. Essential hypertension Controlled Low-sodium, DASH diet - lisinopril (PRINIVIL,ZESTRIL) 40 MG tablet; Take 1 tablet (40 mg total) by mouth daily.  Dispense: 30 tablet; Refill: 3 - metoprolol tartrate (LOPRESSOR) 50 MG tablet; Take 1 tablet (50 mg total) by mouth 2 (two) times daily.  Dispense: 60 tablet; Refill: 3  4. 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 Peripheral arterial disease (HCC) Seen by vascular with plans for angiography Patient to schedule follow-up appointment  6. Acute diastolic congestive heart failure (HCC) EF 55-60% from echo of 11/2016 - potassium chloride SA (K-DUR,KLOR-CON) 20 MEQ tablet; Take 1 tablet (20 mEq total) by mouth daily.  Dispense: 30 tablet; Refill: 3 - torsemide (DEMADEX) 20 MG tablet; Take 2 tablets (40 mg total) by mouth daily.  Dispense: 60 tablet; Refill: 3  7. Persistent  atrial fibrillation (HCC) Stable - rivaroxaban (XARELTO) 20 MG TABS tablet; TAKE 1 TABLET BY MOUTH DAILY WITH SUPPER.  Dispense: 90 tablet; Refill: 3   Meds ordered this encounter  Medications  . metFORMIN (GLUCOPHAGE) 500 MG tablet    Sig: Take 2 tablets (1,000 mg total) by mouth 2 (two) times daily with a meal. TAKE 2 TABLET BY MOUTH IN THE MORNING AND 1 TABLET IN THE EVENING.    Dispense:  120 tablet    Refill:  3  . glipiZIDE (GLUCOTROL) 5 MG tablet    Sig: Take 0.5 tablets (2.5 mg total) by mouth 2 (two) times daily before a meal.    Dispense:  30 tablet    Refill:  3  . atorvastatin (LIPITOR) 20 MG tablet    Sig: Take 1 tablet (20 mg total) by mouth daily.    Dispense:  30 tablet    Refill:  3  . gabapentin (NEURONTIN) 300 MG capsule    Sig: TAKE 1 CAPSULE BY MOUTH 2 TIMES DAILY.    Dispense:  60 capsule    Refill:  3  . lisinopril (PRINIVIL,ZESTRIL) 40 MG tablet    Sig: Take 1 tablet (40 mg total) by mouth daily.    Dispense:  30 tablet    Refill:  3  . metoprolol tartrate (LOPRESSOR) 50 MG tablet    Sig: Take 1 tablet (50 mg total) by mouth 2 (two) times daily.    Dispense:  60 tablet    Refill:  3  . potassium chloride SA (K-DUR,KLOR-CON) 20 MEQ tablet    Sig: Take 1 tablet (20 mEq total) by mouth daily.    Dispense:  30 tablet    Refill:  3  . torsemide (DEMADEX) 20 MG tablet    Sig: Take 2 tablets (40 mg total) by mouth daily.    Dispense:  60 tablet    Refill:  3  . rivaroxaban (XARELTO) 20 MG TABS tablet    Sig: TAKE 1 TABLET BY MOUTH DAILY WITH SUPPER.    Dispense:  90 tablet    Refill:  3    Follow-up: Return in about 3 months (around 11/27/2017) for follow up of chronic medical conditions.   Jaclyn ShaggyEnobong Amao MD

## 2017-08-29 NOTE — Patient Instructions (Signed)
Diabetes Mellitus and Food It is important for you to manage your blood sugar (glucose) level. Your blood glucose level can be greatly affected by what you eat. Eating healthier foods in the appropriate amounts throughout the day at about the same time each day will help you control your blood glucose level. It can also help slow or prevent worsening of your diabetes mellitus. Healthy eating may even help you improve the level of your blood pressure and reach or maintain a healthy weight. General recommendations for healthful eating and cooking habits include:  Eating meals and snacks regularly. Avoid going long periods of time without eating to lose weight.  Eating a diet that consists mainly of plant-based foods, such as fruits, vegetables, nuts, legumes, and whole grains.  Using low-heat cooking methods, such as baking, instead of high-heat cooking methods, such as deep frying.  Work with your dietitian to make sure you understand how to use the Nutrition Facts information on food labels. How can food affect me? Carbohydrates Carbohydrates affect your blood glucose level more than any other type of food. Your dietitian will help you determine how many carbohydrates to eat at each meal and teach you how to count carbohydrates. Counting carbohydrates is important to keep your blood glucose at a healthy level, especially if you are using insulin or taking certain medicines for diabetes mellitus. Alcohol Alcohol can cause sudden decreases in blood glucose (hypoglycemia), especially if you use insulin or take certain medicines for diabetes mellitus. Hypoglycemia can be a life-threatening condition. Symptoms of hypoglycemia (sleepiness, dizziness, and disorientation) are similar to symptoms of having too much alcohol. If your health care provider has given you approval to drink alcohol, do so in moderation and use the following guidelines:  Women should not have more than one drink per day, and men  should not have more than two drinks per day. One drink is equal to: ? 12 oz of beer. ? 5 oz of wine. ? 1 oz of hard liquor.  Do not drink on an empty stomach.  Keep yourself hydrated. Have water, diet soda, or unsweetened iced tea.  Regular soda, juice, and other mixers might contain a lot of carbohydrates and should be counted.  What foods are not recommended? As you make food choices, it is important to remember that all foods are not the same. Some foods have fewer nutrients per serving than other foods, even though they might have the same number of calories or carbohydrates. It is difficult to get your body what it needs when you eat foods with fewer nutrients. Examples of foods that you should avoid that are high in calories and carbohydrates but low in nutrients include:  Trans fats (most processed foods list trans fats on the Nutrition Facts label).  Regular soda.  Juice.  Candy.  Sweets, such as cake, pie, doughnuts, and cookies.  Fried foods.  What foods can I eat? Eat nutrient-rich foods, which will nourish your body and keep you healthy. The food you should eat also will depend on several factors, including:  The calories you need.  The medicines you take.  Your weight.  Your blood glucose level.  Your blood pressure level.  Your cholesterol level.  You should eat a variety of foods, including:  Protein. ? Lean cuts of meat. ? Proteins low in saturated fats, such as fish, egg whites, and beans. Avoid processed meats.  Fruits and vegetables. ? Fruits and vegetables that may help control blood glucose levels, such as apples,   mangoes, and yams.  Dairy products. ? Choose fat-free or low-fat dairy products, such as milk, yogurt, and cheese.  Grains, bread, pasta, and rice. ? Choose whole grain products, such as multigrain bread, whole oats, and brown rice. These foods may help control blood pressure.  Fats. ? Foods containing healthful fats, such as  nuts, avocado, olive oil, canola oil, and fish.  Does everyone with diabetes mellitus have the same meal plan? Because every person with diabetes mellitus is different, there is not one meal plan that works for everyone. It is very important that you meet with a dietitian who will help you create a meal plan that is just right for you. This information is not intended to replace advice given to you by your health care provider. Make sure you discuss any questions you have with your health care provider. Document Released: 05/26/2005 Document Revised: 02/04/2016 Document Reviewed: 07/26/2013 Elsevier Interactive Patient Education  2017 [employer] Inc[employer]  

## 2017-09-06 ENCOUNTER — Other Ambulatory Visit: Payer: Self-pay | Admitting: Family Medicine

## 2017-09-06 DIAGNOSIS — E118 Type 2 diabetes mellitus with unspecified complications: Secondary | ICD-10-CM

## 2017-09-06 MED FILL — ?METFORMIN HCL 500MG TABLET: 500 | 30 days supply | Qty: 90 | Fill #3

## 2017-09-07 ENCOUNTER — Other Ambulatory Visit: Payer: Self-pay | Admitting: *Deleted

## 2017-09-08 ENCOUNTER — Ambulatory Visit: Payer: Self-pay | Admitting: Vascular Surgery

## 2017-09-08 ENCOUNTER — Telehealth: Payer: Self-pay | Admitting: *Deleted

## 2017-09-08 NOTE — Telephone Encounter (Signed)
-----   Message from Laurey Moralealton S McLean, MD sent at 09/07/2017 11:41 PM EST ----- Regarding: RE: Medication Clearance OK to hold Xarelto x 3 days for aortogram.  ----- Message ----- From: Retta Macoberts, Maeola Mchaney J, RN Sent: 09/07/2017   2:53 PM To: Laurey Moralealton S McLean, MD Subject: Medication Clearance                           Patient is scheduled for Aortogram with possible intervention 09/21/17 with Dr. Imogene Burnhen will need to hold Xarelto at least 3 days for this procedure. Request clearance. PLEASE advise.

## 2017-09-11 ENCOUNTER — Encounter: Payer: Self-pay | Admitting: *Deleted

## 2017-09-11 NOTE — Progress Notes (Signed)
Letter with all detailed instructions mailed to patient per his request.

## 2017-09-15 ENCOUNTER — Telehealth: Payer: Self-pay | Admitting: *Deleted

## 2017-09-15 NOTE — Telephone Encounter (Signed)
Patient called to cancel this procedure states he does not want to proceed at this time. [facility] notified.

## 2017-09-18 MED FILL — LISINOPRIL 40 MG TAB: 40 | 30 days supply | Qty: 30 | Fill #0

## 2017-09-18 MED FILL — TORSEMIDE 20 MG TABLET: 20 | 30 days supply | Qty: 60 | Fill #0

## 2017-09-20 MED FILL — ?ATORVASTATIN 20 MG TABLET: 20 | 30 days supply | Qty: 30 | Fill #2

## 2017-09-20 MED FILL — POTASSIUM CL ER 20 MEQ TAB: 20 | 30 days supply | Qty: 30 | Fill #1

## 2017-09-21 ENCOUNTER — Ambulatory Visit (HOSPITAL_COMMUNITY): Admission: RE | Admit: 2017-09-21 | Payer: Self-pay | Source: Ambulatory Visit | Admitting: Vascular Surgery

## 2017-09-21 ENCOUNTER — Encounter (HOSPITAL_COMMUNITY): Admission: RE | Payer: Self-pay | Source: Ambulatory Visit

## 2017-09-21 SURGERY — ABDOMINAL AORTOGRAM W/LOWER EXTREMITY
Anesthesia: LOCAL

## 2017-09-25 MED FILL — GABAPENTIN 300 MG CAPSULE: 300 | 30 days supply | Qty: 60 | Fill #1

## 2017-09-25 MED FILL — ?GLIPIZIDE 5MG TABLET: 5 | 30 days supply | Qty: 30 | Fill #1

## 2017-10-03 ENCOUNTER — Ambulatory Visit (HOSPITAL_COMMUNITY)
Admission: RE | Admit: 2017-10-03 | Discharge: 2017-10-03 | Disposition: A | Discharge status: Home / Self Care | Payer: Self-pay | Source: Ambulatory Visit | Attending: Cardiology | Admitting: Cardiology

## 2017-10-03 ENCOUNTER — Encounter (HOSPITAL_COMMUNITY): Payer: Self-pay | Admitting: Cardiology

## 2017-10-03 ENCOUNTER — Other Ambulatory Visit: Payer: Self-pay

## 2017-10-03 VITALS — BP 152/83 | HR 52 | Wt 290.5 lb

## 2017-10-03 DIAGNOSIS — Z7901 Long term (current) use of anticoagulants: Secondary | ICD-10-CM | POA: Insufficient documentation

## 2017-10-03 DIAGNOSIS — Z79899 Other long term (current) drug therapy: Secondary | ICD-10-CM | POA: Insufficient documentation

## 2017-10-03 DIAGNOSIS — I482 Chronic atrial fibrillation: Secondary | ICD-10-CM | POA: Insufficient documentation

## 2017-10-03 DIAGNOSIS — Z7984 Long term (current) use of oral hypoglycemic drugs: Secondary | ICD-10-CM | POA: Insufficient documentation

## 2017-10-03 DIAGNOSIS — I5032 Chronic diastolic (congestive) heart failure: Secondary | ICD-10-CM | POA: Insufficient documentation

## 2017-10-03 DIAGNOSIS — E11621 Type 2 diabetes mellitus with foot ulcer: Secondary | ICD-10-CM | POA: Insufficient documentation

## 2017-10-03 DIAGNOSIS — I5031 Acute diastolic (congestive) heart failure: Secondary | ICD-10-CM

## 2017-10-03 DIAGNOSIS — I2510 Atherosclerotic heart disease of native coronary artery without angina pectoris: Secondary | ICD-10-CM | POA: Insufficient documentation

## 2017-10-03 DIAGNOSIS — Q249 Congenital malformation of heart, unspecified: Secondary | ICD-10-CM | POA: Insufficient documentation

## 2017-10-03 DIAGNOSIS — I10 Essential (primary) hypertension: Secondary | ICD-10-CM

## 2017-10-03 DIAGNOSIS — E1122 Type 2 diabetes mellitus with diabetic chronic kidney disease: Secondary | ICD-10-CM | POA: Insufficient documentation

## 2017-10-03 DIAGNOSIS — E1151 Type 2 diabetes mellitus with diabetic peripheral angiopathy without gangrene: Secondary | ICD-10-CM | POA: Insufficient documentation

## 2017-10-03 DIAGNOSIS — N183 Chronic kidney disease, stage 3 (moderate): Secondary | ICD-10-CM | POA: Insufficient documentation

## 2017-10-03 DIAGNOSIS — I272 Pulmonary hypertension, unspecified: Secondary | ICD-10-CM | POA: Insufficient documentation

## 2017-10-03 DIAGNOSIS — L97519 Non-pressure chronic ulcer of other part of right foot with unspecified severity: Secondary | ICD-10-CM | POA: Insufficient documentation

## 2017-10-03 DIAGNOSIS — I130 Hypertensive heart and chronic kidney disease with heart failure and stage 1 through stage 4 chronic kidney disease, or unspecified chronic kidney disease: Secondary | ICD-10-CM | POA: Insufficient documentation

## 2017-10-03 LAB — BASIC METABOLIC PANEL
Anion gap: 10 (ref 5–15)
BUN: 35 mg/dL — AB (ref 6–20)
CALCIUM: 9.6 mg/dL (ref 8.9–10.3)
CO2: 25 mmol/L (ref 22–32)
Chloride: 101 mmol/L (ref 101–111)
Creatinine, Ser: 1.29 mg/dL — ABNORMAL HIGH (ref 0.61–1.24)
GFR calc Af Amer: >60 mL/min (ref >60)
GFR, EST NON AFRICAN AMERICAN: 59 mL/min — AB (ref >60)
Glucose, Bld: 232 mg/dL — ABNORMAL HIGH (ref 65–99)
POTASSIUM: 5.4 mmol/L — AB (ref 3.5–5.1)
SODIUM: 136 mmol/L (ref 135–145)

## 2017-10-03 LAB — CBC
HEMATOCRIT: 36.1 % — AB (ref 39.0–52.0)
Hemoglobin: 11.8 g/dL — ABNORMAL LOW (ref 13.0–17.0)
MCH: 32.2 pg (ref 26.0–34.0)
MCHC: 32.7 g/dL (ref 30.0–36.0)
MCV: 98.4 fL (ref 78.0–100.0)
Platelets: 180 10*3/uL (ref 150–400)
RBC: 3.67 MIL/uL — ABNORMAL LOW (ref 4.22–5.81)
RDW: 14.1 % (ref 11.5–15.5)
WBC: 6.1 10*3/uL (ref 4.0–10.5)

## 2017-10-03 LAB — LIPID PANEL
Cholesterol: 105 mg/dL (ref 0–200)
HDL: 45 mg/dL (ref >40)
LDL CALC: 46 mg/dL (ref 0–99)
Total CHOL/HDL Ratio: 2.3 RATIO
Triglycerides: 68 mg/dL (ref <150)
VLDL: 14 mg/dL (ref 0–40)

## 2017-10-03 MED ORDER — TORSEMIDE 20 MG PO TABS: ORAL_TABLET | ORAL | 3 refills | Status: DC

## 2017-10-03 MED ORDER — HYDRALAZINE HCL 100 MG PO TABS: 100.0000 mg | ORAL_TABLET | Freq: Three times a day (TID) | ORAL | 3 refills | Status: DC

## 2017-10-03 MED FILL — ?METFORMIN HCL 500MG TABLET: 500 | 22 days supply | Qty: 90 | Fill #0

## 2017-10-03 MED FILL — TORSEMIDE 20 MG TABLET: 20 | 30 days supply | Qty: 90 | Fill #0

## 2017-10-03 MED FILL — hydrALAZINE HCL 100 MG TABS: 100 | 30 days supply | Qty: 90 | Fill #0

## 2017-10-03 NOTE — Patient Instructions (Addendum)
Increase Torsemide 40 mg (2 tabs), twice a day for 5 days  -Then take 40 mg (2 tabs) in AM, then 20 mg (1 tab) in PM   Increase Hydralazine 100 mg (1 tab), three times a day  You have been referred to Call and get follow up appointment with Dr. Imogene Burnhen  Labs drawn today (if we do not call you, then your lab work was stable)   Your physician recommends that you return for lab work in: 10 days  Your physician recommends that you schedule a follow-up appointment in: 3 weeks with APP

## 2017-10-04 NOTE — Progress Notes (Signed)
Advanced Heart Failure Clinic Note   PCP: Dr. Venetia Mclaughlin  HF Cardiology: Dr. Shirlee Mclaughlin   HPI: [name] is a 62 year old male with a past medical history of chronic diastolic CHF, atrial fibrillation, DM, and congenital heart disease. He was seen in Dr. Jenene Mclaughlin office on 03/16/17 and set up for a right and left heart cath with concerns for type I pulmonary HTN.   He was admitted in 7/18 after right and left heart cath with elevated filling pressures and severe pulmonary HTN. Results below, predominantly pulmonary venous HTN with PVR 3.1 WU. Also with 75% stenosis in the distal LAD. He was started on IV diuresis and diuresed 34 pounds total. Transitioned to po torsemide 60 mg daily (had previously been on Lasix) at home. During his hospitalization he was noted to have transient, asymptomatic episodes of bradycardia. His metoprolol was reduced to 25 mg BID. Discharge weight was 276 pounds.   He returns today for followup of CHF.  His weight has gone back up about 20 lbs.  BP is elevated.  He says that he feels "good."  No exertional dyspnea but not very active.  No orthopnea/PND.  He is taking torsemide 40 mg daily currently.  He has a right foot ulcer which is starting to heal.  He went to [facility] and was supposed to have LE angiogram with Dr. Imogene Mclaughlin but cancelled the procedure.  He is not smoking.   Right/Left Heart Cath and Coronary Angiography 03/30/17 1. Markedly elevated right and left heart filling pressures.  2. Severe pulmonary hypertension, but likely predominantly pulmonary venous hypertension with PVR 3.1 WU.  3. Most significant coronary lesion was 75% distal LAD stenosis, but vessel is small caliber at this point.  Doubt this contributes to his symptoms.  Diabetic-appearing vessels with diffuse disease.  RA mean 23 RV 90/23 PA 94/35, mean 56 PCWP mean 31 LV 155/31 AO 151/86 Oxygen saturations: RA 66% RV 65% PA 61% AO 98% Cardiac Output (Fick) 8.03  Cardiac Index (Fick) 3.09 PVR 3.1  WU  Labs (8/18): creatinine 1.71 Labs (9/18): K 4.7, creatinine 1.37  Past Medical History:  Diagnosis Date  . Atrial fibrillation (HCC)   . Cellulitis and abscess of left leg 06/2016  . CHF (congestive heart failure) (HCC)   . Diabetes (HCC)   . Dyspnea    Current Outpatient Medications  Medication Sig Dispense Refill  . albuterol (PROVENTIL HFA;VENTOLIN HFA) 108 (90 Base) MCG/ACT inhaler Inhale 2 puffs into the lungs every 6 (six) hours as needed for wheezing or shortness of breath. 1 Inhaler 0  . atorvastatin (LIPITOR) 20 MG tablet Take 1 tablet (20 mg total) by mouth daily. 30 tablet 3  . gabapentin (NEURONTIN) 300 MG capsule TAKE 1 CAPSULE BY MOUTH 2 TIMES DAILY. 60 capsule 3  . glipiZIDE (GLUCOTROL) 5 MG tablet Take 0.5 tablets (2.5 mg total) by mouth 2 (two) times daily before a meal. 30 tablet 3  . hydrALAZINE (APRESOLINE) 100 MG tablet Take 1 tablet (100 mg total) by mouth 3 (three) times daily. 90 tablet 3  . lisinopril (PRINIVIL,ZESTRIL) 40 MG tablet Take 1 tablet (40 mg total) by mouth daily. 30 tablet 3  . metFORMIN (GLUCOPHAGE) 500 MG tablet Take 2 tablets (1,000 mg total) by mouth 2 (two) times daily with a meal. TAKE 2 TABLET BY MOUTH IN THE MORNING AND 1 TABLET IN THE EVENING. 120 tablet 3  . metoprolol tartrate (LOPRESSOR) 50 MG tablet Take 1 tablet (50 mg total) by mouth 2 (  two) times daily. 60 tablet 3  . potassium chloride SA (K-DUR,KLOR-CON) 20 MEQ tablet Take 1 tablet (20 mEq total) by mouth daily. 30 tablet 3  . rivaroxaban (XARELTO) 20 MG TABS tablet TAKE 1 TABLET BY MOUTH DAILY WITH SUPPER. 90 tablet 3  . torsemide (DEMADEX) 20 MG tablet Take 40 mg (2 tabs) in AM, then 20 mg (1 tab) in PM 90 tablet 3   No current facility-administered medications for this encounter.    No Known Allergies  Social History   Socioeconomic History  . Marital status: Divorced    Spouse name: Not on file  . Number of children: 1  . Years of education: Not on file  . Highest  education level: Not on file  Social Needs  . Financial resource strain: Not on file  . Food insecurity - worry: Not on file  . Food insecurity - inability: Not on file  . Transportation needs - medical: Not on file  . Transportation needs - non-medical: Not on file  Occupational History  . Not on file  Tobacco Use  . Smoking status: Never Smoker  . Smokeless tobacco: Never Used  Substance and Sexual Activity  . Alcohol use: No    Comment: gave up drinking ~20 years ago  . Drug use: No    Comment: not since the 1st of January  . Sexual activity: Not on file  Other Topics Concern  . Not on file  Social History Narrative   Lives alone     Family History  Problem Relation Age of Onset  . Diabetes Mellitus II Mother        ESRD  . Diabetes Mellitus II Brother   . Emphysema Father    Vitals:   10/03/17 1346  BP: (!) 152/83  Pulse: (!) 52  SpO2: 97%  Weight: 290 lb 8 oz (131.8 kg)   Wt Readings from Last 3 Encounters:  10/03/17 290 lb 8 oz (131.8 kg)  08/29/17 277 lb 12.8 oz (126 kg)  07/12/17 271 lb 6.4 oz (123.1 kg)    PHYSICAL EXAM: General: NAD Neck: JVP 10-11 cm, no thyromegaly or thyroid nodule.  Lungs: Clear to auscultation bilaterally with normal respiratory effort. CV: Nondisplaced PMI.  Heart regular S1/S2, no S3/S4, no murmur.  1+ edema 1/2 up lower legs bilaterally.  No carotid bruit.  Unable to palpate pedal pulses.  Abdomen: Soft, nontender, no hepatosplenomegaly, mild distention.  Skin: Intact without lesions or rashes.  Neurologic: Alert and oriented x 3.  Psych: Normal affect. Extremities: No clubbing or cyanosis.  HEENT: Normal.    ASSESSMENT & PLAN: 1. Pulmonary hypertension: Primarily pulmonary venous hypertension, WHO Group 2, on RHC in 7/18.   2. Chronic diastolic HF: Echo in 3/18 with LV EF 60-65%, RV severely dilated with moderate to severely decreased systolic function. Filling pressures markedly elevated on RHC. He diuresed well during  7/18 admission but weight is trending up again and he is volume overloaded. NYHA class II but not very active.  - Increase torsemide to 40 mg bid x 5 days then 40 qam/20 qpm after that.  BMET today and again in 10 days.  - Continue metoprolol 50 mg BID.  3. Moderate non-obstructive CAD: 75% distal LAD lesion on 7/18 cath.  - Continue atorvastatin, check lipids today.  - No ASA with need for Xarelto and stable CAD 4. Afib: Permanent. Good rate control.  - Continue Xarelto for anticoagulation. Denies bleeding. CBC today.  5. Congenital Heart Disease:  Underwent surgical repair at age 42. He is not sure what type of congenital issue he had. 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  RHC in 7/18 did not suggest residual defect (no evidence for significant shunt).  6. HTN: BP running high, increase hydralazine to 100 mg tid.   7. CKD: Stage 3.  Watch carefully with diuresis.  8. PAD: Healing right foot ulcer.  Dr. Imogene Mclaughlin had planned LE angiogram but he cancelled.   - I would like him to reschedule followup with [facility], we discussed this.   Followup in 3 wks with NP/PA.   Marca Ancona 10/04/2017

## 2017-10-05 ENCOUNTER — Encounter (HOSPITAL_COMMUNITY): Payer: Self-pay

## 2017-10-10 MED FILL — XARELTO 20 MG TABLET: 20 | 30 days supply | Qty: 30 | Fill #1

## 2017-10-13 ENCOUNTER — Ambulatory Visit (HOSPITAL_COMMUNITY)
Admission: RE | Admit: 2017-10-13 | Discharge: 2017-10-13 | Disposition: A | Discharge status: Home / Self Care | Payer: Self-pay | Source: Ambulatory Visit | Attending: Cardiology | Admitting: Cardiology

## 2017-10-13 DIAGNOSIS — I5032 Chronic diastolic (congestive) heart failure: Secondary | ICD-10-CM | POA: Insufficient documentation

## 2017-10-13 LAB — BASIC METABOLIC PANEL
Anion gap: 12 (ref 5–15)
BUN: 59 mg/dL — ABNORMAL HIGH (ref 6–20)
CALCIUM: 9.4 mg/dL (ref 8.9–10.3)
CO2: 24 mmol/L (ref 22–32)
CREATININE: 1.88 mg/dL — AB (ref 0.61–1.24)
Chloride: 101 mmol/L (ref 101–111)
GFR calc non Af Amer: 37 mL/min — ABNORMAL LOW (ref >60)
GFR, EST AFRICAN AMERICAN: 43 mL/min — AB (ref >60)
Glucose, Bld: 182 mg/dL — ABNORMAL HIGH (ref 65–99)
Potassium: 4.9 mmol/L (ref 3.5–5.1)
SODIUM: 137 mmol/L (ref 135–145)

## 2017-10-17 MED FILL — LISINOPRIL 40 MG TAB: 40 | 30 days supply | Qty: 30 | Fill #1

## 2017-10-24 MED FILL — GABAPENTIN 300 MG CAPSULE: 300 | 30 days supply | Qty: 60 | Fill #2

## 2017-10-25 ENCOUNTER — Encounter (HOSPITAL_COMMUNITY): Payer: Self-pay

## 2017-10-25 ENCOUNTER — Telehealth (HOSPITAL_COMMUNITY): Payer: Self-pay

## 2017-10-25 ENCOUNTER — Ambulatory Visit (HOSPITAL_COMMUNITY)
Admission: RE | Admit: 2017-10-25 | Discharge: 2017-10-25 | Disposition: A | Discharge status: Home / Self Care | Payer: Self-pay | Source: Ambulatory Visit | Attending: Cardiology | Admitting: Cardiology

## 2017-10-25 VITALS — BP 132/82 | HR 79 | Wt 279.0 lb

## 2017-10-25 DIAGNOSIS — I2510 Atherosclerotic heart disease of native coronary artery without angina pectoris: Secondary | ICD-10-CM | POA: Insufficient documentation

## 2017-10-25 DIAGNOSIS — N183 Chronic kidney disease, stage 3 unspecified: Secondary | ICD-10-CM

## 2017-10-25 DIAGNOSIS — Z7901 Long term (current) use of anticoagulants: Secondary | ICD-10-CM | POA: Insufficient documentation

## 2017-10-25 DIAGNOSIS — E1122 Type 2 diabetes mellitus with diabetic chronic kidney disease: Secondary | ICD-10-CM | POA: Insufficient documentation

## 2017-10-25 DIAGNOSIS — I482 Chronic atrial fibrillation, unspecified: Secondary | ICD-10-CM

## 2017-10-25 DIAGNOSIS — I272 Pulmonary hypertension, unspecified: Secondary | ICD-10-CM | POA: Insufficient documentation

## 2017-10-25 DIAGNOSIS — Z79899 Other long term (current) drug therapy: Secondary | ICD-10-CM | POA: Insufficient documentation

## 2017-10-25 DIAGNOSIS — I5032 Chronic diastolic (congestive) heart failure: Secondary | ICD-10-CM | POA: Insufficient documentation

## 2017-10-25 DIAGNOSIS — E11621 Type 2 diabetes mellitus with foot ulcer: Secondary | ICD-10-CM | POA: Insufficient documentation

## 2017-10-25 DIAGNOSIS — Z7984 Long term (current) use of oral hypoglycemic drugs: Secondary | ICD-10-CM | POA: Insufficient documentation

## 2017-10-25 DIAGNOSIS — I5031 Acute diastolic (congestive) heart failure: Secondary | ICD-10-CM

## 2017-10-25 DIAGNOSIS — I130 Hypertensive heart and chronic kidney disease with heart failure and stage 1 through stage 4 chronic kidney disease, or unspecified chronic kidney disease: Secondary | ICD-10-CM | POA: Insufficient documentation

## 2017-10-25 LAB — BASIC METABOLIC PANEL
ANION GAP: 13 (ref 5–15)
BUN: 46 mg/dL — AB (ref 6–20)
CALCIUM: 9.5 mg/dL (ref 8.9–10.3)
CO2: 22 mmol/L (ref 22–32)
CREATININE: 1.42 mg/dL — AB (ref 0.61–1.24)
Chloride: 104 mmol/L (ref 101–111)
GFR calc Af Amer: >60 mL/min (ref >60)
GFR, EST NON AFRICAN AMERICAN: 52 mL/min — AB (ref >60)
GLUCOSE: 141 mg/dL — AB (ref 65–99)
Potassium: 4.2 mmol/L (ref 3.5–5.1)
Sodium: 139 mmol/L (ref 135–145)

## 2017-10-25 MED ORDER — AMLODIPINE BESYLATE 5 MG PO TABS: 5.0000 mg | ORAL_TABLET | Freq: Every day | ORAL | 11 refills | Status: DC

## 2017-10-25 MED ORDER — TORSEMIDE 20 MG PO TABS: 40.0000 mg | ORAL_TABLET | Freq: Every day | ORAL | 11 refills | Status: DC

## 2017-10-25 MED FILL — ?AMLODIPINE BESYLATE 5 MG T: 5 MG | 30 days supply | Qty: 30 | Fill #0

## 2017-10-25 MED FILL — TORSEMIDE 20 MG TABLET: 20 | 30 days supply | Qty: 60 | Fill #0

## 2017-10-25 NOTE — Patient Instructions (Addendum)
STOP Lisinopril.  STOP Potassium.  START Amlodipine (Norvasc) 5 mg tablet once daily.  DECREASE Torsemide to 40 mg (2 tabs) once daily.  Routine lab work today. Will notify you of abnormal results, otherwise no news is good news!  Return in 1 week for repeat labs.  Follow up 6 weeks with Dr. Shirlee LatchMcLean.  Take all medication as prescribed the day of your appointment. Bring all medications with you to your appointment.  Do the following things EVERYDAY: 1) Weigh yourself in the morning before breakfast. Write it down and keep it in a log. 2) Take your medicines as prescribed 3) Eat low salt foods-Limit salt (sodium) to 2000 mg per day.  4) Stay as active as you can everyday 5) Limit all fluids for the day to less than 2 liters

## 2017-10-25 NOTE — Telephone Encounter (Signed)
Notes recorded by Teresa CoombsGunter, Nicole Hafley, RN on 10/25/2017 at 8:49 AM EST I have been unable to reach this patient by phone. A letter is being sent to the last known home address.  ------  Notes recorded by Teresa CoombsGunter, Kamyia Thomason, RN on 10/24/2017 at 4:41 PM EST Left VM  ------  Notes recorded by Modesta MessingBrown, Jasmine Q, CMA on 10/18/2017 at 9:35 AM EST Left VM on [phone] (M) for pt to call back for lab results. ------  Notes recorded by Teresa CoombsGunter, Carlissa Pesola, RN on 10/13/2017 at 4:16 PM EST Left VM ------  Notes recorded by Laurey MoraleMcLean, Dalton S, MD on 10/13/2017 at 3:37 PM EST Stop KCl. Stop lisinopril. Start amlodipine 5 mg daily for BP control rather than lisinopril. Decrease torsemide back to 40 mg daily. He will need office visit. BMET 1 week.

## 2017-10-25 NOTE — Progress Notes (Signed)
Advanced Heart Failure Clinic Note   PCP: Dr. Venetia Mclaughlin  HF Cardiology: Dr. Shirlee Mclaughlin   HPI: [name] is a 62 year old male with a past medical history of chronic diastolic CHF, atrial fibrillation, DM, and congenital heart disease. He was seen in Dr. Jenene Mclaughlin office on 03/16/17 and set up for a right and left heart cath with concerns for type I pulmonary HTN.   He was admitted in 7/18 after right and left heart cath with elevated filling pressures and severe pulmonary HTN. Results below, predominantly pulmonary venous HTN with PVR 3.1 WU. Also with 75% stenosis in the distal LAD. He was started on IV diuresis and diuresed 34 pounds total. Transitioned to po torsemide 60 mg daily (had previously been on Lasix) at home. During his hospitalization he was noted to have transient, asymptomatic episodes of bradycardia. His metoprolol was reduced to 25 mg BID. Discharge weight was 276 pounds.   Today he returns for HF follow. Last visit he was volume overloaded and instructed to increase torsemide for a few days. Creatinine went up and we attempted to call with new instructions. Overall feeling fine. Denies SOB/PND/Orthopnea. Appetite ok. No fever or chills. Weight at home 270-280 pounds. Drinks lots of water. Taking all medications. Says he has not followed up with Dr Paul Mclaughlin. He has difficulty paying for medications.   Right/Left Heart Cath and Coronary Angiography 03/30/17 1. Markedly elevated right and left heart filling pressures.  2. Severe pulmonary hypertension, but likely predominantly pulmonary venous hypertension with PVR 3.1 WU.  3. Most significant coronary lesion was 75% distal LAD stenosis, but vessel is small caliber at this point.  Doubt this contributes to his symptoms.  Diabetic-appearing vessels with diffuse disease.  RA mean 23 RV 90/23 PA 94/35, mean 56 PCWP mean 31 LV 155/31 AO 151/86 Oxygen saturations: RA 66% RV 65% PA 61% AO 98% Cardiac Output (Fick) 8.03  Cardiac Index (Fick)  3.09 PVR 3.1 WU  Labs (8/18): creatinine 1.71 Labs (9/18): K 4.7, creatinine 1.37 Labs (10/13/2017: K 4.9 Creatinine 1.88   Past Medical History:  Diagnosis Date  . Atrial fibrillation (HCC)   . Cellulitis and abscess of left leg 06/2016  . CHF (congestive heart failure) (HCC)   . Diabetes (HCC)   . Dyspnea    Current Outpatient Medications  Medication Sig Dispense Refill  . albuterol (PROVENTIL HFA;VENTOLIN HFA) 108 (90 Base) MCG/ACT inhaler Inhale 2 puffs into the lungs every 6 (six) hours as needed for wheezing or shortness of breath. 1 Inhaler 0  . atorvastatin (LIPITOR) 20 MG tablet Take 1 tablet (20 mg total) by mouth daily. 30 tablet 3  . gabapentin (NEURONTIN) 300 MG capsule TAKE 1 CAPSULE BY MOUTH 2 TIMES DAILY. 60 capsule 3  . glipiZIDE (GLUCOTROL) 5 MG tablet Take 0.5 tablets (2.5 mg total) by mouth 2 (two) times daily before a meal. 30 tablet 3  . hydrALAZINE (APRESOLINE) 100 MG tablet Take 1 tablet (100 mg total) by mouth 3 (three) times daily. 90 tablet 3  . lisinopril (PRINIVIL,ZESTRIL) 40 MG tablet Take 1 tablet (40 mg total) by mouth daily. 30 tablet 3  . metFORMIN (GLUCOPHAGE) 500 MG tablet Take 2 tablets (1,000 mg total) by mouth 2 (two) times daily with a meal. TAKE 2 TABLET BY MOUTH IN THE MORNING AND 1 TABLET IN THE EVENING. 120 tablet 3  . metoprolol tartrate (LOPRESSOR) 50 MG tablet Take 1 tablet (50 mg total) by mouth 2 (two) times daily. 60 tablet 3  .  potassium chloride SA (K-DUR,KLOR-CON) 20 MEQ tablet Take 1 tablet (20 mEq total) by mouth daily. 30 tablet 3  . rivaroxaban (XARELTO) 20 MG TABS tablet TAKE 1 TABLET BY MOUTH DAILY WITH SUPPER. 90 tablet 3  . torsemide (DEMADEX) 20 MG tablet Take 40 mg (2 tabs) in AM, then 20 mg (1 tab) in PM 90 tablet 3   No current facility-administered medications for this visit.    No Known Allergies  Social History   Socioeconomic History  . Marital status: Divorced    Spouse name: Not on file  . Number of children: 1   . Years of education: Not on file  . Highest education level: Not on file  Social Needs  . Financial resource strain: Not on file  . Food insecurity - worry: Not on file  . Food insecurity - inability: Not on file  . Transportation needs - medical: Not on file  . Transportation needs - non-medical: Not on file  Occupational History  . Not on file  Tobacco Use  . Smoking status: Never Smoker  . Smokeless tobacco: Never Used  Substance and Sexual Activity  . Alcohol use: No    Comment: gave up drinking ~20 years ago  . Drug use: No    Comment: not since the 1st of January  . Sexual activity: Not on file  Other Topics Concern  . Not on file  Social History Narrative   Lives alone     Family History  Problem Relation Age of Onset  . Diabetes Mellitus II Mother        ESRD  . Diabetes Mellitus II Brother   . Emphysema Father    Vitals:   10/25/17 1408  BP: 132/82  Pulse: 79  SpO2: 97%  Weight: 279 lb (126.6 kg)   Wt Readings from Last 3 Encounters:  10/25/17 279 lb (126.6 kg)  10/03/17 290 lb 8 oz (131.8 kg)  08/29/17 277 lb 12.8 oz (126 kg)    PHYSICAL EXAM: General:  Well appearing. No resp difficulty. Walked in the clinic without difficulty.  HEENT: normal Neck: supple. JVP 6-7. Carotids 2+ bilat; no bruits. No lymphadenopathy or thryomegaly appreciated. Cor: PMI nondisplaced. Regular rate & rhythm. No rubs, gallops or murmurs. Lungs: clear Abdomen: soft, nontender, nondistended. No hepatosplenomegaly. No bruits or masses. Good bowel sounds. Extremities: no cyanosis, clubbing, rash, edema Neuro: alert & orientedx3, cranial nerves grossly intact. moves all 4 extremities w/o difficulty. Affect pleasant   ASSESSMENT & PLAN: 1. Pulmonary hypertension: Primarily pulmonary venous hypertension, WHO Group 2, on RHC in 7/18.   2. Chronic diastolic HF: Echo in 3/18 with LV EF 60-65%, RV severely dilated with moderate to severely decreased systolic function. Filling  pressures markedly elevated on RHC. He diuresed well during 7/18 admission.  NYHA III  Volume status stable.  Cut back torsemide to 40 mg daily.  - Stop potassium  - Check BMET today and in 1 week.  - Continue metoprolol 50 mg BID.  3. Moderate non-obstructive ,CAD: 75% distal LAD lesion on 7/18 cath.  - No s/s ischemia.  - Continue atorvastatin - No ASA with need for Xarelto and stable CAD 4. Afib: Permanent.  - Rate controlled. No bleeding problems.   - Continue Xarelto for anticoagulation.  5. Congenital Heart Disease:  Underwent surgical repair at age 69. He is not sure what type of congenital issue he had. 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  RHC in 7/18 did not  suggest residual defect (no evidence for significant shunt).  6. HTN: Improved.  7. CKD: Stage 3.  Check BMET now and in 1 week.  8. PAD: Healing right foot ulcer.  Dr. Imogene Burnhen had planned LE angiogram but he cancelled.  I encouraged him to reschedule.    Follow up in 2-3 months. Referred to HFSW to assist with medications.   Greater than 50% of the (total minutes 25) visit spent in counseling/coordination of care regarding the above.     Tatum Massman NP-C 10/25/2017

## 2017-10-26 MED FILL — ?ATORVASTATIN 20 MG TABLET: 20 | 30 days supply | Qty: 30 | Fill #0

## 2017-10-30 MED FILL — ?GLIPIZIDE 5MG TABLET: 5 | 30 days supply | Qty: 30 | Fill #2

## 2017-10-30 MED FILL — ?METFORMIN HCL 500MG TABLET: 500 | 30 days supply | Qty: 90 | Fill #1

## 2017-11-01 ENCOUNTER — Ambulatory Visit (HOSPITAL_COMMUNITY)
Admission: RE | Admit: 2017-11-01 | Discharge: 2017-11-01 | Disposition: A | Discharge status: Home / Self Care | Payer: Self-pay | Source: Ambulatory Visit | Attending: Internal Medicine | Admitting: Internal Medicine

## 2017-11-01 DIAGNOSIS — I5032 Chronic diastolic (congestive) heart failure: Secondary | ICD-10-CM | POA: Insufficient documentation

## 2017-11-01 LAB — BASIC METABOLIC PANEL
ANION GAP: 12 (ref 5–15)
BUN: 20 mg/dL (ref 6–20)
CALCIUM: 9.3 mg/dL (ref 8.9–10.3)
CO2: 25 mmol/L (ref 22–32)
Chloride: 98 mmol/L — ABNORMAL LOW (ref 101–111)
Creatinine, Ser: 1.21 mg/dL (ref 0.61–1.24)
GFR calc Af Amer: >60 mL/min (ref >60)
GLUCOSE: 130 mg/dL — AB (ref 65–99)
POTASSIUM: 4.2 mmol/L (ref 3.5–5.1)
Sodium: 135 mmol/L (ref 135–145)

## 2017-11-08 ENCOUNTER — Ambulatory Visit: Payer: Self-pay | Attending: Family Medicine | Admitting: Family Medicine

## 2017-11-08 ENCOUNTER — Encounter: Payer: Self-pay | Admitting: Family Medicine

## 2017-11-08 VITALS — BP 136/75 | HR 55 | Temp 97.5°F | Ht 73.0 in | Wt 276.6 lb

## 2017-11-08 DIAGNOSIS — I110 Hypertensive heart disease with heart failure: Secondary | ICD-10-CM | POA: Insufficient documentation

## 2017-11-08 DIAGNOSIS — Z9889 Other specified postprocedural states: Secondary | ICD-10-CM | POA: Insufficient documentation

## 2017-11-08 DIAGNOSIS — I5032 Chronic diastolic (congestive) heart failure: Secondary | ICD-10-CM

## 2017-11-08 DIAGNOSIS — H918X2 Other specified hearing loss, left ear: Secondary | ICD-10-CM

## 2017-11-08 DIAGNOSIS — Z7901 Long term (current) use of anticoagulants: Secondary | ICD-10-CM | POA: Insufficient documentation

## 2017-11-08 DIAGNOSIS — I5030 Unspecified diastolic (congestive) heart failure: Secondary | ICD-10-CM | POA: Insufficient documentation

## 2017-11-08 DIAGNOSIS — H9202 Otalgia, left ear: Secondary | ICD-10-CM | POA: Insufficient documentation

## 2017-11-08 DIAGNOSIS — Z79899 Other long term (current) drug therapy: Secondary | ICD-10-CM | POA: Insufficient documentation

## 2017-11-08 DIAGNOSIS — I272 Pulmonary hypertension, unspecified: Secondary | ICD-10-CM | POA: Insufficient documentation

## 2017-11-08 DIAGNOSIS — E118 Type 2 diabetes mellitus with unspecified complications: Secondary | ICD-10-CM | POA: Insufficient documentation

## 2017-11-08 DIAGNOSIS — Z794 Long term (current) use of insulin: Secondary | ICD-10-CM | POA: Insufficient documentation

## 2017-11-08 DIAGNOSIS — H9192 Unspecified hearing loss, left ear: Secondary | ICD-10-CM | POA: Insufficient documentation

## 2017-11-08 DIAGNOSIS — Z89422 Acquired absence of other left toe(s): Secondary | ICD-10-CM | POA: Insufficient documentation

## 2017-11-08 LAB — GLUCOSE, POCT (MANUAL RESULT ENTRY): POC Glucose: 161 mg/dl — AB (ref 70–99)

## 2017-11-08 MED ORDER — CETIRIZINE HCL 10 MG PO TABS: 10.0000 mg | ORAL_TABLET | Freq: Every day | ORAL | 1 refills | Status: DC

## 2017-11-08 MED FILL — ?CETIRIZINE HCL 10 MG TABLE: 10 | 30 days supply | Qty: 30 | Fill #0

## 2017-11-08 NOTE — Patient Instructions (Signed)

## 2017-11-08 NOTE — Progress Notes (Signed)
Subjective:  Patient ID: Paul Mclaughlin, male    DOB: 07-07-1956  Age: 62 y.o. MRN: [id number]  CC: Hearing Loss   HPI Paul Mclaughlin is a 62 year old male with a history of type 2 diabetes mellitus (A1c 7.8), hypertension, atrial fibrillation (on anticoagulation with Xarelto and rate control with metoprolol), history of Congenital heart disease status post surgery, Pulmonary hypertension, congestive heart failure ( EF of 55-60% from 2-D echo 11/2016) here for an acute visit complaining of "left ear buzzing", pain and loss of hearing for the last 1 week.  He denies nasal congestion, postnasal drip, fever or headaches and has not used any OTC medications for his symptoms.  He was seen by cardiology last month and had temporary increase in dose of torsemide due to his being fluid overloaded but as this time has returned back to his usual dose of 40 mg daily and he denies shortness of breath, chest pains or pedal edema. His A1c is 7.8 and glipizide had been added at his last office visit and he denies hypoglycemia, visual concerns, numbness in extremities. He has an upcoming appointment for management of his chronic medical conditions next month.  Past Medical History:  Diagnosis Date  . Atrial fibrillation (HCC)   . Cellulitis and abscess of left leg 06/2016  . CHF (congestive heart failure) (HCC)   . Diabetes (HCC)   . Dyspnea     Past Surgical History:  Procedure Laterality Date  . AMPUTATION Left 06/24/2016   Procedure: FOOT FIFTH RAY TOE AMPUTATION;  Surgeon: Nadara MustardMarcus V Duda, MD;  Location: [facility] OR;  Service: Orthopedics;  Laterality: Left;  . open heart surgery     As a child.  [age] years old.  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   [name]. RIGHT/LEFT HEART CATH AND CORONARY ANGIOGRAPHY N/A 03/30/2017   Procedure: Right/Left Heart Cath and Coronary Angiography;  Surgeon: Laurey MoraleMcLean, Dalton S, MD;  [facility];  Service: Cardiovascular;  Laterality: N/A;    No Known Allergies   Outpatient  Medications Prior to Visit  Medication Sig Dispense Refill  . albuterol (PROVENTIL HFA;VENTOLIN HFA) 108 (90 Base) MCG/ACT inhaler Inhale 2 puffs into the lungs every 6 (six) hours as needed for wheezing or shortness of breath. 1 Inhaler 0  . amLODipine (NORVASC) 5 MG tablet Take 1 tablet (5 mg total) by mouth daily. 30 tablet 11  . atorvastatin (LIPITOR) 20 MG tablet Take 1 tablet (20 mg total) by mouth daily. 30 tablet 3  . gabapentin (NEURONTIN) 300 MG capsule TAKE 1 CAPSULE BY MOUTH 2 TIMES DAILY. 60 capsule 3  . glipiZIDE (GLUCOTROL) 5 MG tablet Take 0.5 tablets (2.5 mg total) by mouth 2 (two) times daily before a meal. 30 tablet 3  . hydrALAZINE (APRESOLINE) 100 MG tablet Take 1 tablet (100 mg total) by mouth 3 (three) times daily. 90 tablet 3  . metFORMIN (GLUCOPHAGE) 500 MG tablet Take 2 tablets (1,000 mg total) by mouth 2 (two) times daily with a meal. TAKE 2 TABLET BY MOUTH IN THE MORNING AND 1 TABLET IN THE EVENING. 120 tablet 3  . metoprolol tartrate (LOPRESSOR) 50 MG tablet Take 1 tablet (50 mg total) by mouth 2 (two) times daily. 60 tablet 3  . rivaroxaban (XARELTO) 20 MG TABS tablet TAKE 1 TABLET BY MOUTH DAILY WITH SUPPER. 90 tablet 3  . torsemide (DEMADEX) 20 MG tablet Take 2 tablets (40 mg total) by mouth daily. 60 tablet 11   No facility-administered medications prior to visit.  ROS Review of Systems  Constitutional: Negative for activity change and appetite change.  HENT: Positive for ear pain, hearing loss and tinnitus. Negative for sinus pressure and sore throat.   Eyes: Negative for visual disturbance.  Respiratory: Negative for cough, chest tightness and shortness of breath.   Cardiovascular: Negative for chest pain and leg swelling.  Gastrointestinal: Negative for abdominal distention, abdominal pain, constipation and diarrhea.  Endocrine: Negative.   Genitourinary: Negative for dysuria.  Musculoskeletal: Negative for joint swelling and myalgias.  Skin: Negative  for rash.  Allergic/Immunologic: Negative.   Neurological: Negative for weakness, light-headedness and numbness.  Psychiatric/Behavioral: Negative for dysphoric mood and suicidal ideas.    Objective:  BP 136/75   Pulse (!) 55   Temp (!) 97.5 F (36.4 C) (Oral)   Ht 6\' 1"  (1.854 m)   Wt 276 lb 9.6 oz (125.5 kg)   SpO2 97%   BMI 36.49 kg/m   BP/Weight 11/08/2017 10/25/2017 10/03/2017  Systolic BP 136 132 152  Diastolic BP 75 82 83  Wt. (Lbs) 276.6 279 290.5  BMI 36.49 36.81 38.33      Physical Exam  Constitutional: He is oriented to person, place, and time. He appears well-developed and well-nourished.  HENT:  Lightly erythematous oropharynx Minimal fluid behind L TM; R TM is normal.  Cardiovascular: Normal rate, normal heart sounds and intact distal pulses.  No murmur heard. Pulmonary/Chest: Effort normal and breath sounds normal. He has no wheezes. He has no rales. He exhibits no tenderness.  Abdominal: Soft. Bowel sounds are normal. He exhibits no distension and no mass. There is no tenderness.  Musculoskeletal: Normal range of motion.  Neurological: He is alert and oriented to person, place, and time.  Skin: Skin is warm and dry.  Psychiatric: He has a normal mood and affect.    Lab Results  Component Value Date   HGBA1C 7.8 08/29/2017     Assessment & Plan:   1. Type 2 diabetes mellitus with complication, with long-term current use of insulin (HCC) Not fully optimized with A1c of 7.8 Glipizide was added at his last visit Due for an A1c next month. Continue diabetic diet - POCT glucose (manual entry)  2. Left ear pain Likely sinus related Placed on Zyrtec likely sinus related  3. 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  Diastolic CHF EF 55-60% Euvolemic Continue torsemide, beta-blocker  Meds ordered this encounter  Medications  .  cetirizine (ZYRTEC) 10 MG tablet    Sig: Take 1 tablet (10 mg total) by mouth daily.    Dispense:  30 tablet    Refill:  1    Follow-up: Return in about 1 month (around 12/06/2017) for Follow-up of chronic medical conditions, keep previously scheduled appointment.   [name] MD

## 2017-11-10 MED FILL — ?METOPROLOL 50 MG TABLET: 50 | 30 days supply | Qty: 60 | Fill #2

## 2017-11-13 MED FILL — XARELTO 20 MG TABLET: 20 | 30 days supply | Qty: 30 | Fill #2

## 2017-11-13 MED FILL — hydrALAZINE HCL 100 MG TABS: 100 | 30 days supply | Qty: 90 | Fill #1

## 2017-11-21 MED FILL — ?ATORVASTATIN 20 MG TABLET: 20 | 30 days supply | Qty: 30 | Fill #1

## 2017-11-21 MED FILL — ?AMLODIPINE BESYLATE 5 MG T: 5 MG | 30 days supply | Qty: 30 | Fill #1

## 2017-11-24 ENCOUNTER — Ambulatory Visit: Payer: Self-pay | Admitting: Family Medicine

## 2017-11-27 MED FILL — GABAPENTIN 300 MG CAPSULE: 300 | 30 days supply | Qty: 60 | Fill #3

## 2017-11-27 MED FILL — glipiZIDE 5 MG TABS: 5 | 30 days supply | Qty: 30 | Fill #3

## 2017-11-29 ENCOUNTER — Encounter: Payer: Self-pay | Admitting: Family Medicine

## 2017-11-29 ENCOUNTER — Ambulatory Visit: Payer: Self-pay | Attending: Family Medicine | Admitting: Family Medicine

## 2017-11-29 VITALS — BP 123/65 | HR 49 | Temp 97.6°F | Ht 73.0 in | Wt 282.0 lb

## 2017-11-29 DIAGNOSIS — Z7901 Long term (current) use of anticoagulants: Secondary | ICD-10-CM | POA: Insufficient documentation

## 2017-11-29 DIAGNOSIS — R001 Bradycardia, unspecified: Secondary | ICD-10-CM | POA: Insufficient documentation

## 2017-11-29 DIAGNOSIS — Z79899 Other long term (current) drug therapy: Secondary | ICD-10-CM | POA: Insufficient documentation

## 2017-11-29 DIAGNOSIS — Z7984 Long term (current) use of oral hypoglycemic drugs: Secondary | ICD-10-CM | POA: Insufficient documentation

## 2017-11-29 DIAGNOSIS — I110 Hypertensive heart disease with heart failure: Secondary | ICD-10-CM | POA: Insufficient documentation

## 2017-11-29 DIAGNOSIS — I4819 Other persistent atrial fibrillation: Secondary | ICD-10-CM

## 2017-11-29 DIAGNOSIS — Z89429 Acquired absence of other toe(s), unspecified side: Secondary | ICD-10-CM | POA: Insufficient documentation

## 2017-11-29 DIAGNOSIS — E1140 Type 2 diabetes mellitus with diabetic neuropathy, unspecified: Secondary | ICD-10-CM | POA: Insufficient documentation

## 2017-11-29 DIAGNOSIS — R0602 Shortness of breath: Secondary | ICD-10-CM | POA: Insufficient documentation

## 2017-11-29 DIAGNOSIS — I5032 Chronic diastolic (congestive) heart failure: Secondary | ICD-10-CM

## 2017-11-29 DIAGNOSIS — I10 Essential (primary) hypertension: Secondary | ICD-10-CM

## 2017-11-29 DIAGNOSIS — Z9119 Patient's noncompliance with other medical treatment and regimen: Secondary | ICD-10-CM | POA: Insufficient documentation

## 2017-11-29 DIAGNOSIS — E118 Type 2 diabetes mellitus with unspecified complications: Secondary | ICD-10-CM

## 2017-11-29 DIAGNOSIS — E1149 Type 2 diabetes mellitus with other diabetic neurological complication: Secondary | ICD-10-CM

## 2017-11-29 DIAGNOSIS — I481 Persistent atrial fibrillation: Secondary | ICD-10-CM | POA: Insufficient documentation

## 2017-11-29 DIAGNOSIS — Z794 Long term (current) use of insulin: Secondary | ICD-10-CM

## 2017-11-29 DIAGNOSIS — I272 Pulmonary hypertension, unspecified: Secondary | ICD-10-CM

## 2017-11-29 LAB — GLUCOSE, POCT (MANUAL RESULT ENTRY): POC GLUCOSE: 136 mg/dL — AB (ref 70–99)

## 2017-11-29 LAB — POCT GLYCOSYLATED HEMOGLOBIN (HGB A1C): HEMOGLOBIN A1C: 6

## 2017-11-29 MED ORDER — GABAPENTIN 300 MG PO CAPS: ORAL_CAPSULE | ORAL | 3 refills | Status: DC

## 2017-11-29 MED ORDER — GLIPIZIDE 5 MG PO TABS: 2.5000 mg | ORAL_TABLET | Freq: Two times a day (BID) | ORAL | 3 refills | Status: DC

## 2017-11-29 MED ORDER — METFORMIN HCL 500 MG PO TABS: 1000.0000 mg | ORAL_TABLET | Freq: Two times a day (BID) | ORAL | 3 refills | Status: DC

## 2017-11-29 MED ORDER — ATORVASTATIN CALCIUM 20 MG PO TABS: 20.0000 mg | ORAL_TABLET | Freq: Every day | ORAL | 3 refills | Status: DC

## 2017-11-29 NOTE — Progress Notes (Signed)
Subjective:  Patient ID: Paul Mclaughlin, male    DOB: [birth year]  Age: 62 y.o. MRN: [id number]  CC: Diabetes   HPI Paul Mclaughlin is a 62 year old male with a history of type 2 diabetes mellitus (A1c 6.0), hypertension, atrial fibrillation (on anticoagulation with Xarelto and rate control with metoprolol), history of Congenital heart disease status post surgery, Pulmonary hypertension, congestive heart failure ( EF of 55-60% from 2-D echo 11/2016). He was placed on Zyrtec at his last office visit for hearing loss, ear fullness and tinnitus and reports resolution of symptoms at this time.  His A1c is 6.0 and has improved from 7.8 previously; he has been compliant with his medications and denies hypoglycemia, visual concerns.  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 Over the weekend he developed shortness of breath while attempting to pull the car jack to help unable to change a tire and this is unusual for him.  Review of his weight indicates a 6 pound weight gain over the last 1 month and he endorses noncompliance with his daily fluid restrictions.  Denies chest pain, pedal edema, orthopnea and denies shortness of breath at rest or mild exertion. Has an upcoming appointment with cardiology next weeks.  Doing well on Xarelto and denies GI bleed, hematuria or bleeding from his gums.  He tolerates all his other medications and denies adverse effects.  Past Medical History:  Diagnosis Date  . Atrial fibrillation (HCC)   . Cellulitis and abscess of left leg 06/2016  . CHF (congestive heart failure) (HCC)   . Diabetes (HCC)   . Dyspnea     Past Surgical History:  Procedure Laterality Date  . AMPUTATION Left 06/24/2016   Procedure: FOOT FIFTH RAY TOE AMPUTATION;  Surgeon: [name], MD;  Location: [facility] OR;  Service: Orthopedics;  Laterality: Left;  . open heart surgery     As a child.  [age] years old.  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   [name] RIGHT/LEFT HEART  CATH AND CORONARY ANGIOGRAPHY N/A 03/30/2017   Procedure: Right/Left Heart Cath and Coronary Angiography;  Surgeon: [name], MD;  Location: [facility] INVASIVE CV LAB;  Service: Cardiovascular;  Laterality: N/A;    No Known Allergies   Outpatient Medications Prior to Visit  Medication Sig Dispense Refill  . albuterol (PROVENTIL HFA;VENTOLIN HFA) 108 (90 Base) MCG/ACT inhaler Inhale 2 puffs into the lungs every 6 (six) hours as needed for wheezing or shortness of breath. 1 Inhaler 0  . amLODipine (NORVASC) 5 MG tablet Take 1 tablet (5 mg total) by mouth daily. 30 tablet 11  . cetirizine (ZYRTEC) 10 MG tablet Take 1 tablet (10 mg total) by mouth daily. 30 tablet 1  . hydrALAZINE (APRESOLINE) 100 MG tablet Take 1 tablet (100 mg total) by mouth 3 (three) times daily. 90 tablet 3  . metoprolol tartrate (LOPRESSOR) 50 MG tablet Take 1 tablet (50 mg total) by mouth 2 (two) times daily. 60 tablet 3  . rivaroxaban (XARELTO) 20 MG TABS tablet TAKE 1 TABLET BY MOUTH DAILY WITH SUPPER. 90 tablet 3  . torsemide (DEMADEX) 20 MG tablet Take 2 tablets (40 mg total) by mouth daily. 60 tablet 11  . atorvastatin (LIPITOR) 20 MG tablet Take 1 tablet (20 mg total) by mouth daily. 30 tablet 3  . gabapentin (NEURONTIN) 300 MG capsule TAKE 1 CAPSULE BY MOUTH 2 TIMES DAILY. 60 capsule 3  . glipiZIDE (GLUCOTROL) 5 MG tablet Take 0.5 tablets (  2.5 mg total) by mouth 2 (two) times daily before a meal. 30 tablet 3  . metFORMIN (GLUCOPHAGE) 500 MG tablet Take 2 tablets (1,000 mg total) by mouth 2 (two) times daily with a meal. TAKE 2 TABLET BY MOUTH IN THE MORNING AND 1 TABLET IN THE EVENING. 120 tablet 3   No facility-administered medications prior to visit.     ROS Review of Systems  Constitutional: Negative for activity change and appetite change.  HENT: Negative for sinus pressure and sore throat.   Eyes: Negative for visual disturbance.  Respiratory: Positive for shortness of breath. Negative for cough and chest  tightness.   Cardiovascular: Negative for chest pain and leg swelling.  Gastrointestinal: Negative for abdominal distention, abdominal pain, constipation and diarrhea.  Endocrine: Negative.   Genitourinary: Negative for dysuria.  Musculoskeletal: Negative for joint swelling and myalgias.  Skin: Negative for rash.  Allergic/Immunologic: Negative.   Neurological: Negative for weakness, light-headedness and numbness.  Psychiatric/Behavioral: Negative for dysphoric mood and suicidal ideas.    Objective:  BP 123/65   Pulse (!) 49   Temp 97.6 F (36.4 C) (Oral)   Ht 6\' 1"  (1.854 m)   Wt 282 lb (127.9 kg)   SpO2 98%   BMI 37.21 kg/m   BP/Weight 11/29/2017 11/08/2017 10/25/2017  Systolic BP 123 136 132  Diastolic BP 65 75 82  Wt. (Lbs) 282 276.6 279  BMI 37.21 36.49 36.81    Wt Readings from Last 3 Encounters:  11/29/17 282 lb (127.9 kg)  11/08/17 276 lb 9.6 oz (125.5 kg)  10/25/17 279 lb (126.6 kg)     Physical Exam  Constitutional: He is oriented to person, place, and time. He appears well-developed and well-nourished.  Cardiovascular: Normal heart sounds and intact distal pulses. Bradycardia present.  No murmur heard. Pulmonary/Chest: Effort normal and breath sounds normal. He has no wheezes. He has no rales. He exhibits no tenderness.  Abdominal: Soft. Bowel sounds are normal. He exhibits no distension and no mass. There is no tenderness.  Musculoskeletal: Normal range of motion.  Neurological: He is alert and oriented to person, place, and time.  Skin: Skin is warm and dry.  Psychiatric: He has a normal mood and affect.    CMP Latest Ref Rng & Units 11/01/2017 10/25/2017 10/13/2017  Glucose 65 - 99 mg/dL 161(W130(H) 960(A141(H) 540(J182(H)  BUN 6 - 20 mg/dL 20 81(X46(H) 91(Y59(H)  Creatinine 0.61 - 1.24 mg/dL 7.821.21 9.56(O1.42(H) 1.30(Q1.88(H)  Sodium 135 - 145 mmol/L 135 139 137  Potassium 3.5 - 5.1 mmol/L 4.2 4.2 4.9  Chloride 101 - 111 mmol/L 98(L) 104 101  CO2 22 - 32 mmol/L 25 22 24   Calcium 8.9 -  10.3 mg/dL 9.3 9.5 9.4  Total Protein 6.0 - 8.5 g/dL - - -  Total Bilirubin 0.0 - 1.2 mg/dL - - -  Alkaline Phos 39 - 117 IU/L - - -  AST 0 - 40 IU/L - - -  ALT 0 - 44 IU/L - - -    Lipid Panel     Component Value Date/Time   CHOL 105 10/03/2017 1457   TRIG 68 10/03/2017 1457   HDL 45 10/03/2017 1457   CHOLHDL 2.3 10/03/2017 1457   VLDL 14 10/03/2017 1457   LDLCALC 46 10/03/2017 1457    Lab Results  Component Value Date   HGBA1C 6.0 11/29/2017     Assessment & Plan:   1. Type 2 diabetes mellitus with other neurologic complication, without long-term current use of insulin (  HCC) Stable on gabapentin - gabapentin (NEURONTIN) 300 MG capsule; TAKE 1 CAPSULE BY MOUTH 2 TIMES DAILY.  Dispense: 60 capsule; Refill: 3  2. Type 2 diabetes mellitus with complication, without long-term current use of insulin (HCC) Improved with A1c of 6.0 Continue current management Diabetic diet, lifestyle modifications - POCT glucose (manual entry) - POCT glycosylated hemoglobin (Hb A1C) - metFORMIN (GLUCOPHAGE) 500 MG tablet; Take 2 tablets (1,000 mg total) by mouth 2 (two) times daily with a meal. TAKE 2 TABLET BY MOUTH IN THE MORNING AND 1 TABLET IN THE EVENING.  Dispense: 120 tablet; Refill: 3 - glipiZIDE (GLUCOTROL) 5 MG tablet; Take 0.5 tablets (2.5 mg total) by mouth 2 (two) times daily before a meal.  Dispense: 30 tablet; Refill: 3 - atorvastatin (LIPITOR) 20 MG tablet; Take 1 tablet (20 mg total) by mouth daily.  Dispense: 30 tablet; Refill: 3  3. Essential hypertension Controlled Continue medications, low-sodium diet  4. Persistent atrial fibrillation (HCC) On rate control with metoprolol however he is bradycardic; may consider decreasing dose of metoprolol if he remains bradycardic Continue Xarelto for anticoagulation   5. Pulmonary HTN (HCC) Stable  6. Chronic diastolic congestive heart failure (HCC) EF of 55-60% from 11/2016 He does not appear to be volume overloaded however in  the light of his symptoms of dyspnea and slight weight gain, I have advised him to take an extra 10 mg of torsemide for the next 5 days and then revert back to his regular dose. Emphasized the need to be compliant with fluid restriction of less than 2 L/day, heart healthy diet, daily weight checks   Meds ordered this encounter  Medications  . gabapentin (NEURONTIN) 300 MG capsule    Sig: TAKE 1 CAPSULE BY MOUTH 2 TIMES DAILY.    Dispense:  60 capsule    Refill:  3  . metFORMIN (GLUCOPHAGE) 500 MG tablet    Sig: Take 2 tablets (1,000 mg total) by mouth 2 (two) times daily with a meal. TAKE 2 TABLET BY MOUTH IN THE MORNING AND 1 TABLET IN THE EVENING.    Dispense:  120 tablet    Refill:  3  . glipiZIDE (GLUCOTROL) 5 MG tablet    Sig: Take 0.5 tablets (2.5 mg total) by mouth 2 (two) times daily before a meal.    Dispense:  30 tablet    Refill:  3  . atorvastatin (LIPITOR) 20 MG tablet    Sig: Take 1 tablet (20 mg total) by mouth daily.    Dispense:  30 tablet    Refill:  3    Follow-up: Return in about 3 months (around 03/01/2018) for follow up of chronic medical conditions.   [name] MD

## 2017-11-30 ENCOUNTER — Other Ambulatory Visit: Payer: Self-pay

## 2017-11-30 MED ORDER — TETANUS-DIPHTH-ACELL PERTUSSIS 5-2.5-18.5 LF-MCG/0.5 IM SUSP: 0.5000 mL | Freq: Once | INTRAMUSCULAR | 0 refills | Status: AC

## 2017-12-01 MED FILL — $BOOSTRIX VACCINE SYRINGE: 5-2.5-18.5 | 1 days supply | Qty: 1 | Fill #0

## 2017-12-08 ENCOUNTER — Other Ambulatory Visit: Payer: Self-pay

## 2017-12-08 ENCOUNTER — Encounter (HOSPITAL_COMMUNITY): Payer: Self-pay | Admitting: Cardiology

## 2017-12-08 ENCOUNTER — Ambulatory Visit (HOSPITAL_COMMUNITY)
Admission: RE | Admit: 2017-12-08 | Discharge: 2017-12-08 | Disposition: A | Discharge status: Home / Self Care | Payer: Self-pay | Source: Ambulatory Visit | Attending: Cardiology | Admitting: Cardiology

## 2017-12-08 ENCOUNTER — Telehealth (HOSPITAL_COMMUNITY): Payer: Self-pay

## 2017-12-08 VITALS — BP 140/62 | HR 68 | Wt 279.5 lb

## 2017-12-08 DIAGNOSIS — I739 Peripheral vascular disease, unspecified: Secondary | ICD-10-CM

## 2017-12-08 DIAGNOSIS — N183 Chronic kidney disease, stage 3 unspecified: Secondary | ICD-10-CM

## 2017-12-08 DIAGNOSIS — Z833 Family history of diabetes mellitus: Secondary | ICD-10-CM | POA: Insufficient documentation

## 2017-12-08 DIAGNOSIS — I272 Pulmonary hypertension, unspecified: Secondary | ICD-10-CM | POA: Insufficient documentation

## 2017-12-08 DIAGNOSIS — Z79899 Other long term (current) drug therapy: Secondary | ICD-10-CM | POA: Insufficient documentation

## 2017-12-08 DIAGNOSIS — I5031 Acute diastolic (congestive) heart failure: Secondary | ICD-10-CM

## 2017-12-08 DIAGNOSIS — Z825 Family history of asthma and other chronic lower respiratory diseases: Secondary | ICD-10-CM | POA: Insufficient documentation

## 2017-12-08 DIAGNOSIS — Z7984 Long term (current) use of oral hypoglycemic drugs: Secondary | ICD-10-CM | POA: Insufficient documentation

## 2017-12-08 DIAGNOSIS — Z7901 Long term (current) use of anticoagulants: Secondary | ICD-10-CM | POA: Insufficient documentation

## 2017-12-08 DIAGNOSIS — I130 Hypertensive heart and chronic kidney disease with heart failure and stage 1 through stage 4 chronic kidney disease, or unspecified chronic kidney disease: Secondary | ICD-10-CM | POA: Insufficient documentation

## 2017-12-08 DIAGNOSIS — E11621 Type 2 diabetes mellitus with foot ulcer: Secondary | ICD-10-CM | POA: Insufficient documentation

## 2017-12-08 DIAGNOSIS — I482 Chronic atrial fibrillation: Secondary | ICD-10-CM | POA: Insufficient documentation

## 2017-12-08 DIAGNOSIS — I5032 Chronic diastolic (congestive) heart failure: Secondary | ICD-10-CM | POA: Insufficient documentation

## 2017-12-08 DIAGNOSIS — Z8774 Personal history of (corrected) congenital malformations of heart and circulatory system: Secondary | ICD-10-CM | POA: Insufficient documentation

## 2017-12-08 DIAGNOSIS — L97519 Non-pressure chronic ulcer of other part of right foot with unspecified severity: Secondary | ICD-10-CM | POA: Insufficient documentation

## 2017-12-08 DIAGNOSIS — E1122 Type 2 diabetes mellitus with diabetic chronic kidney disease: Secondary | ICD-10-CM | POA: Insufficient documentation

## 2017-12-08 DIAGNOSIS — I2510 Atherosclerotic heart disease of native coronary artery without angina pectoris: Secondary | ICD-10-CM | POA: Insufficient documentation

## 2017-12-08 LAB — CBC
HCT: 34.6 % — ABNORMAL LOW (ref 39.0–52.0)
Hemoglobin: 11.1 g/dL — ABNORMAL LOW (ref 13.0–17.0)
MCH: 31.5 pg (ref 26.0–34.0)
MCHC: 32.1 g/dL (ref 30.0–36.0)
MCV: 98.3 fL (ref 78.0–100.0)
PLATELETS: 211 10*3/uL (ref 150–400)
RBC: 3.52 MIL/uL — AB (ref 4.22–5.81)
RDW: 14.3 % (ref 11.5–15.5)
WBC: 9 10*3/uL (ref 4.0–10.5)

## 2017-12-08 LAB — BASIC METABOLIC PANEL
ANION GAP: 10 (ref 5–15)
BUN: 21 mg/dL — ABNORMAL HIGH (ref 6–20)
CO2: 25 mmol/L (ref 22–32)
Calcium: 9.4 mg/dL (ref 8.9–10.3)
Chloride: 103 mmol/L (ref 101–111)
Creatinine, Ser: 1.08 mg/dL (ref 0.61–1.24)
GFR calc Af Amer: >60 mL/min (ref >60)
GLUCOSE: 124 mg/dL — AB (ref 65–99)
POTASSIUM: 4 mmol/L (ref 3.5–5.1)
Sodium: 138 mmol/L (ref 135–145)

## 2017-12-08 MED ORDER — TORSEMIDE 20 MG PO TABS: ORAL_TABLET | ORAL | 11 refills | Status: DC

## 2017-12-08 NOTE — Telephone Encounter (Signed)
Pt to say he had an 11 am appt with Dr. Imogene Burnhen on 01/24/18. Pt aware and mailed out appoint information.

## 2017-12-08 NOTE — Patient Instructions (Addendum)
Take Torsemide 60 mg (3 tabs) daily, alternating 40 mg (2 tabs) every other day  Labs drawn today (if we do not call you, then your lab work was stable)   S[employer] broker[employer]ie Social worker will call you   Your physician recommends that you return for lab work in: 2 weeks   Your physician has requested that you have an echocardiogram. Echocardiography is a painless test that uses sound waves to create images of your heart. It provides your doctor with information about the size and shape of your heart and how well your heart's chambers and valves are working. This procedure takes approximately one hour. There are no restrictions for this procedure.  Your physician recommends that you schedule a follow-up appointment in: 3 months with Dr. Shirlee LatchMcLean  an a echocardiogram (July, 2019) Please Call an Schedule Appointment

## 2017-12-10 NOTE — Progress Notes (Signed)
Advanced Heart Failure Clinic Note   PCP: Dr. Venetia Night  HF Cardiology: Dr. Shirlee Latch   HPI: Paul Mclaughlin is a 62 year old male with a past medical history of chronic diastolic CHF, atrial fibrillation, DM, and congenital heart disease. He was seen in Dr. Jenene Slicker office on 03/16/17 and set up for a right and left heart cath with concerns for type I pulmonary HTN.   He was admitted in 7/18 after right and left heart cath with elevated filling pressures and severe pulmonary HTN. Results below, predominantly pulmonary venous HTN with PVR 3.1 WU. Also with 75% stenosis in the distal LAD. He was started on IV diuresis and diuresed 34 pounds total. Transitioned to po torsemide 60 mg daily (had previously been on Lasix) at home. During his hospitalization he was noted to have transient, asymptomatic episodes of bradycardia. His metoprolol was reduced to 25 mg BID. Discharge weight was 276 pounds.   He has a right foot ulcer which is starting to heal.  He went to [facility] and was supposed to have LE angiogram with Dr. Imogene Burn but cancelled the procedure.  He is not smoking.   He returns for followup of diastolic CHF.  Weight is stable.  He is short of breath carrying a heavy load.  He is not short of breath walking on flat ground.  No orthopnea/PND.  No chest pain.    Right/Left Heart Cath and Coronary Angiography 03/30/17 1. Markedly elevated right and left heart filling pressures.  2. Severe pulmonary hypertension, but likely predominantly pulmonary venous hypertension with PVR 3.1 WU.  3. Most significant coronary lesion was 75% distal LAD stenosis, but vessel is small caliber at this point.  Doubt this contributes to his symptoms.  Diabetic-appearing vessels with diffuse disease.  RA mean 23 RV 90/23 PA 94/35, mean 56 PCWP mean 31 LV 155/31 AO 151/86 Oxygen saturations: RA 66% RV 65% PA 61% AO 98% Cardiac Output (Fick) 8.03  Cardiac Index (Fick) 3.09 PVR 3.1 WU  Labs (8/18): creatinine 1.71 Labs (9/18):  K 4.7, creatinine 1.37 Labs (1/19): LDL 46, HDL 45 Labs (2/19): K 4.2, creatinine 1.21  Past Medical History:  Diagnosis Date  . Atrial fibrillation (HCC)   . Cellulitis and abscess of left leg 06/2016  . CHF (congestive heart failure) (HCC)   . Diabetes (HCC)   . Dyspnea    Current Outpatient Medications  Medication Sig Dispense Refill  . albuterol (PROVENTIL HFA;VENTOLIN HFA) 108 (90 Base) MCG/ACT inhaler Inhale 2 puffs into the lungs every 6 (six) hours as needed for wheezing or shortness of breath. 1 Inhaler 0  . amLODipine (NORVASC) 5 MG tablet Take 1 tablet (5 mg total) by mouth daily. 30 tablet 11  . atorvastatin (LIPITOR) 20 MG tablet Take 1 tablet (20 mg total) by mouth daily. 30 tablet 3  . cetirizine (ZYRTEC) 10 MG tablet Take 1 tablet (10 mg total) by mouth daily. 30 tablet 1  . gabapentin (NEURONTIN) 300 MG capsule TAKE 1 CAPSULE BY MOUTH 2 TIMES DAILY. 60 capsule 3  . glipiZIDE (GLUCOTROL) 5 MG tablet Take 0.5 tablets (2.5 mg total) by mouth 2 (two) times daily before a meal. 30 tablet 3  . hydrALAZINE (APRESOLINE) 100 MG tablet Take 1 tablet (100 mg total) by mouth 3 (three) times daily. 90 tablet 3  . metFORMIN (GLUCOPHAGE) 500 MG tablet Take 2 tablets (1,000 mg total) by mouth 2 (two) times daily with a meal. 120 tablet 3  . metoprolol tartrate (LOPRESSOR) 50 MG  tablet Take 1 tablet (50 mg total) by mouth 2 (two) times daily. 60 tablet 3  . rivaroxaban (XARELTO) 20 MG TABS tablet TAKE 1 TABLET BY MOUTH DAILY WITH SUPPER. 90 tablet 3  . torsemide (DEMADEX) 20 MG tablet Alternate 60 mg (3 tabs), and 40 mg (2 tabs) daily 75 tablet 11   No current facility-administered medications for this encounter.    No Known Allergies  Social History   Socioeconomic History  . Marital status: Divorced    Spouse name: Not on file  . Number of children: 1  . Years of education: Not on file  . Highest education level: Not on file  Occupational History  . Not on file  Social Needs   . Financial resource strain: Not on file  . Food insecurity:    Worry: Not on file    Inability: Not on file  . Transportation needs:    Medical: Not on file    Non-medical: Not on file  Tobacco Use  . Smoking status: Never Smoker  . Smokeless tobacco: Never Used  Substance and Sexual Activity  . Alcohol use: No    Comment: gave up drinking ~20 years ago  . Drug use: No    Types: Marijuana    Comment: not since the 1st of January  . Sexual activity: Not on file  Lifestyle  . Physical activity:    Days per week: Not on file    Minutes per session: Not on file  . Stress: Not on file  Relationships  . Social connections:    Talks on phone: Not on file    Gets together: Not on file    Attends religious service: Not on file    Active member of club or organization: Not on file    Attends meetings of clubs or organizations: Not on file    Relationship status: Not on file  . Intimate partner violence:    Fear of current or ex partner: Not on file    Emotionally abused: Not on file    Physically abused: Not on file    Forced sexual activity: Not on file  Other Topics Concern  . Not on file  Social History Narrative   Lives alone     Family History  Problem Relation Age of Onset  . Diabetes Mellitus II Mother        ESRD  . Diabetes Mellitus II Brother   . Emphysema Father    Vitals:   12/08/17 1402  BP: 140/62  Pulse: 68  SpO2: 97%  Weight: 279 lb 8 oz (126.8 kg)   Wt Readings from Last 3 Encounters:  12/08/17 279 lb 8 oz (126.8 kg)  11/29/17 282 lb (127.9 kg)  11/08/17 276 lb 9.6 oz (125.5 kg)    PHYSICAL EXAM: General: NAD Neck: JVP 8-9 cm, no thyromegaly or thyroid nodule.  Lungs: Clear to auscultation bilaterally with normal respiratory effort. CV: Nondisplaced PMI.  Heart regular S1/S2, no S3/S4, no murmur.  Trace ankle edema.  No carotid bruit.  Normal pedal pulses.  Abdomen: Soft, nontender, no hepatosplenomegaly, no distention.  Skin: Intact without  lesions or rashes.  Neurologic: Alert and oriented x 3.  Psych: Normal affect. Extremities: No clubbing or cyanosis.  HEENT: Normal.   ASSESSMENT & PLAN: 1. Pulmonary hypertension: Primarily pulmonary venous hypertension, WHO Group 2, on RHC in 7/18.  No role for selective pulmonary vasodilators.  2. Chronic diastolic HF: Echo in 3/18 with LV EF 60-65%, RV severely  dilated with moderate to severely decreased systolic function. Filling pressures markedly elevated on RHC. NYHA class II symptoms.  He is volume overloaded on exam today.   - Increase torsemide to 60 mg daily alternating with 40 mg daily.  BMET in 2 wks.  - Continue metoprolol 50 mg BID.  - I will repeat an echo in 3 months when he follows up.  3. Moderate non-obstructive CAD: 75% distal LAD lesion on 7/18 cath.  - Continue atorvastatin, Good lipids in 1/19.   - No ASA with need for Xarelto and stable CAD 4. Afib: Permanent. Good rate control.  - Continue Xarelto for anticoagulation. Denies bleeding. CBC today.  5. Congenital Heart Disease:  Underwent surgical repair at age 34. He is not sure what type of congenital issue he had. 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  RHC in 7/18 did not suggest residual defect (no evidence for significant shunt).  6. HTN: BP borderline, continue current regimen.   7. CKD: Stage 3.  Watch carefully with diuresis.  8. PAD: Healing right foot ulcer.  Dr. Imogene Burn had planned LE angiogram but he cancelled.   - I would like him to reschedule followup with [facility], we discussed this and I will arrange an appointment for him.   Followup in 3 months.   Paul Mclaughlin 12/10/2017

## 2017-12-11 ENCOUNTER — Telehealth: Payer: Self-pay | Admitting: Licensed Clinical Social Worker

## 2017-12-11 NOTE — Telephone Encounter (Signed)
CSW referred to assist with insurance concerns. CSW contacted patient to discuss options and message left. Lasandra BeechJackie Ticara Waner, LCSW, CCSW-MCS [phone]

## 2017-12-12 MED FILL — XARELTO 20 MG TABLET: 20 | 30 days supply | Qty: 30 | Fill #2

## 2017-12-12 MED FILL — hydrALAZINE HCL 100 MG TABS: 100 | 30 days supply | Qty: 90 | Fill #2

## 2017-12-13 MED FILL — ?METFORMIN HCL 500MG TABLET: 500 | 30 days supply | Qty: 90 | Fill #2

## 2017-12-14 MED FILL — ?METOPROLOL 50 MG TABLET: 50 | 30 days supply | Qty: 60 | Fill #0

## 2017-12-19 MED FILL — TORSEMIDE 20 MG TABS: 20 | 30 days supply | Qty: 60 | Fill #1

## 2017-12-20 MED FILL — ?AMLODIPINE BESYLATE 5 MG T: 5 MG | 30 days supply | Qty: 30 | Fill #2

## 2017-12-22 ENCOUNTER — Ambulatory Visit (HOSPITAL_COMMUNITY)
Admission: RE | Admit: 2017-12-22 | Discharge: 2017-12-22 | Disposition: A | Discharge status: Home / Self Care | Payer: Self-pay | Source: Ambulatory Visit | Attending: Cardiology | Admitting: Cardiology

## 2017-12-22 DIAGNOSIS — I5032 Chronic diastolic (congestive) heart failure: Secondary | ICD-10-CM | POA: Insufficient documentation

## 2017-12-22 LAB — BASIC METABOLIC PANEL
Anion gap: 9 (ref 5–15)
BUN: 31 mg/dL — AB (ref 6–20)
CALCIUM: 9.3 mg/dL (ref 8.9–10.3)
CHLORIDE: 104 mmol/L (ref 101–111)
CO2: 23 mmol/L (ref 22–32)
CREATININE: 1.23 mg/dL (ref 0.61–1.24)
Glucose, Bld: 120 mg/dL — ABNORMAL HIGH (ref 65–99)
Potassium: 4.5 mmol/L (ref 3.5–5.1)
Sodium: 136 mmol/L (ref 135–145)

## 2017-12-26 ENCOUNTER — Other Ambulatory Visit: Payer: Self-pay

## 2017-12-26 DIAGNOSIS — I7025 Atherosclerosis of native arteries of other extremities with ulceration: Secondary | ICD-10-CM

## 2017-12-26 DIAGNOSIS — I739 Peripheral vascular disease, unspecified: Secondary | ICD-10-CM

## 2017-12-27 MED FILL — ?ATORVASTATIN 20 MG TABLET: 20 | 30 days supply | Qty: 30 | Fill #2

## 2018-01-01 MED FILL — glipiZIDE 5 MG TABS: 5 | 30 days supply | Qty: 30 | Fill #0

## 2018-01-01 MED FILL — GABAPENTIN 300 MG CAPSULE: 300 | 30 days supply | Qty: 60 | Fill #0

## 2018-01-11 MED FILL — XARELTO 20 MG TABLET: 20 | 23 days supply | Qty: 23 | Fill #3

## 2018-01-11 MED FILL — hydrALAZINE HCL 100 MG TABS: 100 | 30 days supply | Qty: 90 | Fill #3

## 2018-01-11 MED FILL — ?METFORMIN HCL 500MG TABLET: 500 | 30 days supply | Qty: 90 | Fill #3

## 2018-01-16 MED FILL — TORSEMIDE 20 MG TABLET: 20 | 30 days supply | Qty: 60 | Fill #2

## 2018-01-16 MED FILL — ?METOPROLOL 50 MG TABLET: 50 | 30 days supply | Qty: 60 | Fill #1

## 2018-01-16 MED FILL — ?AMLODIPINE BESYLATE 5 MG T: 5 MG | 30 days supply | Qty: 30 | Fill #3

## 2018-01-17 NOTE — Progress Notes (Signed)
Established Critical Limb Ischemia Patient   History of Present Illness   Paul Mclaughlin is a 62 y.o. (05/04/1956) male who presents with chief complaint: burnt R great toe.  This patient healed his prior R foot ulcer but a few weeks ago accidentally burnt his R great toe on a heater.  This toe blistered leading to an ulcer on his great toe.  He has been using silvadene on the toe.  Reportedly the wound is healing.  Patient denies any fever or chills.  This patient was seen on 07/12/17 with a chronic R foot ulcer for over one year.  I offered him angiography but he refuse, wanting to try wound care first.  I had offered referral to Dr. [name].  It appears he followed up a Podiatry in Nov 2018 with some debridement of his ulcer.  The patient is not seeing either Dr. [name] or Podiatry.  The patient has no rest pain.  The patient's treatment regimen currently included: maximal medical management and self directed wound care.  The patient's PMH, PSH, SH, and FamHx were reviewed on 01/24/18 are unchanged from 07/12/17.  Current Outpatient Medications  Medication Sig Dispense Refill  . albuterol (PROVENTIL HFA;VENTOLIN HFA) 108 (90 Base) MCG/ACT inhaler Inhale 2 puffs into the lungs every 6 (six) hours as needed for wheezing or shortness of breath. 1 Inhaler 0  . amLODipine (NORVASC) 5 MG tablet Take 1 tablet (5 mg total) by mouth daily. 30 tablet 11  . atorvastatin (LIPITOR) 20 MG tablet Take 1 tablet (20 mg total) by mouth daily. 30 tablet 3  . cetirizine (ZYRTEC) 10 MG tablet Take 1 tablet (10 mg total) by mouth daily. 30 tablet 1  . gabapentin (NEURONTIN) 300 MG capsule TAKE 1 CAPSULE BY MOUTH 2 TIMES DAILY. 60 capsule 3  . glipiZIDE (GLUCOTROL) 5 MG tablet Take 0.5 tablets (2.5 mg total) by mouth 2 (two) times daily before a meal. 30 tablet 3  . hydrALAZINE (APRESOLINE) 100 MG tablet Take 1 tablet (100 mg total) by mouth 3 (three) times daily. 90 tablet 3  . metFORMIN (GLUCOPHAGE) 500 MG tablet  Take 2 tablets (1,000 mg total) by mouth 2 (two) times daily with a meal. 120 tablet 3  . metoprolol tartrate (LOPRESSOR) 50 MG tablet Take 1 tablet (50 mg total) by mouth 2 (two) times daily. 60 tablet 3  . rivaroxaban (XARELTO) 20 MG TABS tablet TAKE 1 TABLET BY MOUTH DAILY WITH SUPPER. 90 tablet 3  . torsemide (DEMADEX) 20 MG tablet Alternate 60 mg (3 tabs), and 40 mg (2 tabs) daily 75 tablet 11   No current facility-administered medications for this visit.     On ROS today: no fever or chills, no drainage   Physical Examination   Vitals:   01/24/18 1138  BP: 140/65  Pulse: 71  Resp: 18  Temp: (!) 97 F (36.1 C)  TempSrc: Oral  SpO2: 97%  Weight: 277 lb 4.8 oz (125.8 kg)  Height:  (1.854 m)   Body mass index is 36.59 kg/m.  General Alert, O x 3, WD, NAD  Pulmonary Sym exp, good B air movt, CTA B  Cardiac RRR, Nl S1, S2, no Murmurs, No rubs, No S3,S4  Vascular Vessel Right Left  Radial Palpable Palpable  Brachial Palpable Palpable  Carotid Palpable, No Bruit Palpable, No Bruit  Aorta Not palpable N/A  Femoral Palpable Palpable  Popliteal Not palpable Not palpable  PT Not palpable Palpable  DP Not palpable Palpable  Gastro- intestinal soft, non-distended, non-tender to palpation, No guarding or rebound, no HSM, no masses, no CVAT B, No palpable prominent aortic pulse,    Musculo- skeletal M/S 5/5 throughout  , Extremities without ischemic changes except shallow ulcer along tip of R great toe, some ischemic tissue in ulcer bed, R great toe appears edematous, +TTP, No edema present, No visible varicosities , No Lipodermatosclerosis present  Neurologic Pain and light touch intact in extremities , Motor exam as listed above      Non-Invasive Vascular Imaging   ABI (01/24/2018)  R:   ABI: 0.68,   PT: mono  DP: mono  TBI:  ND due to ulcer  L:   ABI: 1.19,   PT: tri  DP: tri  TBI: 1.19   Medical Decision Making   Paul Mclaughlin is a 62  y.o. male who presents with: RLE great toe ulcer, known RLE mod PAD, cellulitis vs osteomyelitis R great toe   Based on the patient's vascular studies and examination, I have offered the patient: referral back to Dr. [name] for evaluation for osteomyelitis.  Keflex 500 mg 1 PO tid x 7 days to start treatment of R great toe.  I discussed in depth with the patient the nature of atherosclerosis, and emphasized the importance of maximal medical management including strict control of blood pressure, blood glucose, and lipid levels, antiplatelet agents, obtaining regular exercise, and cessation of smoking.    The patient is aware that without maximal medical management the underlying atherosclerotic disease process will progress, limiting the benefit of any interventions.  The patient is currently on a statin: Lipitor.   The patient is currently not on an anti-platelet due to bleeding risks related to use of an anticoagulant.   Patient is on Xarelto.  I offered again Aortogram, right leg runoff and possible intervention to the patient.  He again refused.  Follow up in 6 month for BLE ABI  Thank you for allowing [place] to participate in this patient's care.   Leonides Sake, MD, FACS [facility] Office: [phone] Pager: [phone]

## 2018-01-24 ENCOUNTER — Ambulatory Visit (INDEPENDENT_AMBULATORY_CARE_PROVIDER_SITE_OTHER): Payer: Self-pay | Admitting: Vascular Surgery

## 2018-01-24 ENCOUNTER — Ambulatory Visit (HOSPITAL_COMMUNITY)
Admission: RE | Admit: 2018-01-24 | Discharge: 2018-01-24 | Disposition: A | Discharge status: Home / Self Care | Payer: Self-pay | Source: Ambulatory Visit | Attending: Vascular Surgery | Admitting: Vascular Surgery

## 2018-01-24 ENCOUNTER — Other Ambulatory Visit: Payer: Self-pay

## 2018-01-24 ENCOUNTER — Encounter: Payer: Self-pay | Admitting: Vascular Surgery

## 2018-01-24 VITALS — BP 140/65 | HR 71 | Temp 97.0°F | Resp 18 | Ht 73.0 in | Wt 277.3 lb

## 2018-01-24 DIAGNOSIS — I739 Peripheral vascular disease, unspecified: Secondary | ICD-10-CM

## 2018-01-24 DIAGNOSIS — I7025 Atherosclerosis of native arteries of other extremities with ulceration: Secondary | ICD-10-CM

## 2018-01-24 DIAGNOSIS — L97519 Non-pressure chronic ulcer of other part of right foot with unspecified severity: Secondary | ICD-10-CM | POA: Insufficient documentation

## 2018-01-24 MED ORDER — CEPHALEXIN 500 MG PO CAPS: 500.0000 mg | ORAL_CAPSULE | Freq: Three times a day (TID) | ORAL | 0 refills | Status: DC

## 2018-01-24 MED FILL — CEPHALEXIN 500 MG CAPSULE: 500 | 7 days supply | Qty: 21 | Fill #0

## 2018-01-26 MED FILL — ?ATORVASTATIN 20 MG TABLET: 20 | 30 days supply | Qty: 30 | Fill #3

## 2018-01-29 MED FILL — glipiZIDE 5 MG TABS: 5 | 30 days supply | Qty: 30 | Fill #1

## 2018-01-30 ENCOUNTER — Ambulatory Visit (INDEPENDENT_AMBULATORY_CARE_PROVIDER_SITE_OTHER): Payer: No Typology Code available for payment source | Admitting: Orthopedic Surgery

## 2018-01-30 ENCOUNTER — Encounter (INDEPENDENT_AMBULATORY_CARE_PROVIDER_SITE_OTHER): Payer: Self-pay | Admitting: Orthopedic Surgery

## 2018-01-30 DIAGNOSIS — IMO0002 Reserved for concepts with insufficient information to code with codable children: Secondary | ICD-10-CM

## 2018-01-30 DIAGNOSIS — I87323 Chronic venous hypertension (idiopathic) with inflammation of bilateral lower extremity: Secondary | ICD-10-CM

## 2018-01-30 DIAGNOSIS — L97511 Non-pressure chronic ulcer of other part of right foot limited to breakdown of skin: Secondary | ICD-10-CM | POA: Insufficient documentation

## 2018-01-30 DIAGNOSIS — Z89432 Acquired absence of left foot: Secondary | ICD-10-CM

## 2018-01-30 DIAGNOSIS — M6701 Short Achilles tendon (acquired), right ankle: Secondary | ICD-10-CM | POA: Insufficient documentation

## 2018-01-30 NOTE — Progress Notes (Signed)
Office Visit Note   Patient: Paul Mclaughlin           Date of Birth: [birth year]           MRN: [id number] Visit Date: 01/30/2018              Requested by: [name], MD [address] [place], [place] [address] PCP: [name], MD  Chief Complaint  Patient presents with  . Right Foot - Wound Check      HPI: Patient is a 62 year old gentleman who presents with a burn ulcer right great toe.  Patient has diabetic insensate neuropathy venous insufficiency peripheral vascular disease with venous and arterial insufficiency.  Patient has been seen by Dr. Imogene Burn and has recommended arteriogram studies of his leg.  Patient is currently on Xarelto for his heart.  Assessment & Plan: Visit Diagnoses:  1. Foot amputation status, left (HCC)   2. Ulcer of toe of right foot, limited to breakdown of skin (HCC)   3. Achilles tendon contracture, right   4. Idiopathic chronic venous hypertension of both lower extremities with inflammation     Plan: Recommended Achilles stretching this was demonstrated to him recommended a stiff soled sneaker to unload pressure from the great toe continue with the Silvadene and dressing changes daily minimize weightbearing.  Recommended knee-high compression stockings to help with the venous insufficiency.  Follow-Up Instructions: Return in about 3 weeks (around 02/20/2018).   Ortho Exam  Patient is alert, oriented, no adenopathy, well-dressed, normal affect, normal respiratory effort. Examination patient has significant heel cord contracture with dorsiflexion short of neutral with his knee extended this is also overloading the great toe.  Patient has dark brawny skin color changes of the entire leg with venous insufficiency and pitting edema there are no open ulcers.  He has a Waggoner grade 1 ulcer of the tip of the right great toe he does not have good palpable pulses.  After informed consent a 10 blade knife was used to debride the skin and soft  tissue back to bleeding viable granulation tissue there was good petechial bleeding the ulcer is 10 x 20 mm and 1 mm deep this was touched with silver nitrate Iodosorb and a Band-Aid were applied.  Imaging: No results found. No images are attached to the encounter.  Labs: Lab Results  Component Value Date   HGBA1C 6.0 11/29/2017   HGBA1C 7.8 08/29/2017   HGBA1C 5.8 05/29/2017   REPTSTATUS 06/27/2016 FINAL 06/22/2016   CULT NO GROWTH 5 DAYS 06/22/2016     Lab Results  Component Value Date   ALBUMIN 3.9 12/26/2016   ALBUMIN 3.5 (L) 11/29/2016   ALBUMIN 3.6 11/24/2016    There is no height or weight on file to calculate BMI.  Orders:  No orders of the defined types were placed in this encounter.  No orders of the defined types were placed in this encounter.    Procedures: No procedures performed  Clinical Data: No additional findings.  ROS:  All other systems negative, except as noted in the HPI. Review of Systems  Objective: Vital Signs: There were no vitals taken for this visit.  Specialty Comments:  No specialty comments available.  PMFS History: Patient Active Problem List   Diagnosis Date Noted  . Ulcer of toe of right foot, limited to breakdown of skin (HCC) 01/30/2018  . Achilles tendon contracture, right 01/30/2018  . Idiopathic chronic venous hypertension of both lower extremities with inflammation 01/30/2018  . Atherosclerosis of  native arteries of the extremities with ulceration (HCC) 07/12/2017  . Peripheral arterial disease (HCC) 05/29/2017  . CKD (chronic kidney disease) stage 3, GFR 30-59 ml/min (HCC) 05/24/2017  . Acute diastolic CHF (congestive heart failure) (HCC) 03/30/2017  . Morbid obesity (HCC) 01/09/2017  . Pulmonary HTN (HCC) 12/11/2016  . Type 2 diabetes mellitus with complication, with long-term current use of insulin (HCC) 12/11/2016  . Diastolic CHF (HCC) 11/24/2016  . Insomnia 11/24/2016  . Foot amputation status, left (HCC)  08/22/2016  . Atrial fibrillation (HCC) 06/30/2016  . Essential hypertension 06/30/2016  . Cellulitis of left foot 06/22/2016  . Controlled type 2 diabetes mellitus with hyperglycemia (HCC) 06/22/2016   Past Medical History:  Diagnosis Date  . Atrial fibrillation (HCC)   . Cellulitis and abscess of left leg 06/2016  . CHF (congestive heart failure) (HCC)   . Diabetes (HCC)   . Dyspnea     Family History  Problem Relation Age of Onset  . Diabetes Mellitus II Mother        ESRD  . Diabetes Mellitus II Brother   . Emphysema Father     Past Surgical History:  Procedure Laterality Date  . AMPUTATION Left 06/24/2016   Procedure: FOOT FIFTH RAY TOE AMPUTATION;  Surgeon: [name], MD;  Location: [facility] OR;  Service: Orthopedics;  Laterality: Left;  . open heart surgery     As a child.  [age] years old.  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   [name] RIGHT/LEFT HEART CATH AND CORONARY ANGIOGRAPHY N/A 03/30/2017   Procedure: Right/Left Heart Cath and Coronary Angiography;  Surgeon: [name], MD;  [facility] CV LAB;  Service: Cardiovascular;  Laterality: N/A;   Social History   Occupational History  . Not on file  Tobacco Use  . Smoking status: Never Smoker  . Smokeless tobacco: Never Used  Substance and Sexual Activity  . Alcohol use: No    Comment: gave up drinking ~20 years ago  . Drug use: No    Types: Marijuana    Comment: not since the 1st of January  . Sexual activity: Not on file

## 2018-02-12 ENCOUNTER — Other Ambulatory Visit (HOSPITAL_COMMUNITY): Payer: Self-pay | Admitting: Cardiology

## 2018-02-12 DIAGNOSIS — I10 Essential (primary) hypertension: Secondary | ICD-10-CM

## 2018-02-12 MED FILL — ?METFORMIN HCL 500MG TABLET: 500 | 30 days supply | Qty: 90 | Fill #4

## 2018-02-12 MED FILL — hydrALAZINE HCL 100 MG TABS: 100 | 30 days supply | Qty: 90 | Fill #0

## 2018-02-12 MED FILL — XARELTO 20 MG TABLET: 20 | 30 days supply | Qty: 30 | Fill #3

## 2018-02-14 ENCOUNTER — Ambulatory Visit: Payer: Self-pay

## 2018-02-19 MED FILL — TORSEMIDE 20 MG TABLET: 20 | 30 days supply | Qty: 60 | Fill #3

## 2018-02-19 MED FILL — ?AMLODIPINE BESYLATE 5 MG T: 5 MG | 30 days supply | Qty: 30 | Fill #4

## 2018-02-20 ENCOUNTER — Encounter (INDEPENDENT_AMBULATORY_CARE_PROVIDER_SITE_OTHER): Payer: Self-pay | Admitting: Orthopedic Surgery

## 2018-02-20 ENCOUNTER — Ambulatory Visit (INDEPENDENT_AMBULATORY_CARE_PROVIDER_SITE_OTHER): Payer: Self-pay | Admitting: Orthopedic Surgery

## 2018-02-20 VITALS — Ht 73.0 in | Wt 277.0 lb

## 2018-02-20 DIAGNOSIS — L97511 Non-pressure chronic ulcer of other part of right foot limited to breakdown of skin: Secondary | ICD-10-CM

## 2018-02-20 NOTE — Progress Notes (Signed)
Office Visit Note   Patient: Paul Mclaughlin           Date of Birth: 1956-05-08           MRN: [id number] Visit Date: 02/20/2018              Requested by: [name], [place], MD [address] , [place] [id number] PCP: [name], [place], MD  Chief Complaint  Patient presents with  . Right Foot - Wound Check      HPI: Patient is a 62 year old gentleman with peripheral vascular disease diabetic insensate neuropathy with a Wagener grade 1 ulcer of right great toe.  Patient states he has not been doing his Achilles stretching and has soft flexible sneakers.  Assessment & Plan: Visit Diagnoses:  1. Ulcer of toe of right foot, limited to breakdown of skin (HCC)     Plan: Recommended a stiffer soled sneaker to unload the forefoot recommended over-the-counter arch supports to unload the forefoot.  He is a Band-Aid over the ulcer plus antibiotic ointment.  Follow-Up Instructions: Return in about 1 month (around 03/20/2018).   Ortho Exam  Patient is alert, oriented, no adenopathy, well-dressed, normal affect, normal respiratory effort. Examination patient has an antalgic gait he has Achilles contracture with dorsiflexion just short of neutral with his knee extended.  He has palpable pulses.  He has decreased range of motion of the MTP joint of the great toe he has a Waggoner grade 1 ulcer beneath the right great toe.  After informed consent a 10 blade knife was used to debride the skin and soft tissue back to healthy viable granulation tissue this was touched with silver nitrate the ulcer is 10 mm in diameter and 1 mm deep there is no exposed bone tendon no cellulitis.  Imaging: No results found. No images are attached to the encounter.  Labs: Lab Results  Component Value Date   HGBA1C 6.0 11/29/2017   HGBA1C 7.8 08/29/2017   HGBA1C 5.8 05/29/2017   REPTSTATUS 06/27/2016 FINAL 06/22/2016   CULT NO GROWTH 5 DAYS 06/22/2016     Lab Results  Component Value Date   ALBUMIN  3.9 12/26/2016   ALBUMIN 3.5 (L) 11/29/2016   ALBUMIN 3.6 11/24/2016    Body mass index is 36.55 kg/m.  Orders:  No orders of the defined types were placed in this encounter.  No orders of the defined types were placed in this encounter.    Procedures: No procedures performed  Clinical Data: No additional findings.  ROS:  All other systems negative, except as noted in the HPI. Review of Systems  Objective: Vital Signs: Ht 6\' 1"  (1.854 m)   Wt 277 lb (125.6 kg)   BMI 36.55 kg/m   Specialty Comments:  No specialty comments available.  PMFS History: Patient Active Problem List   Diagnosis Date Noted  . Ulcer of toe of right foot, limited to breakdown of skin (HCC) 01/30/2018  . Achilles tendon contracture, right 01/30/2018  . Idiopathic chronic venous hypertension of both lower extremities with inflammation 01/30/2018  . Atherosclerosis of native arteries of the extremities with ulceration (HCC) 07/12/2017  . Peripheral arterial disease (HCC) 05/29/2017  . CKD (chronic kidney disease) stage 3, GFR 30-59 ml/min (HCC) 05/24/2017  . Acute diastolic CHF (congestive heart failure) (HCC) 03/30/2017  . Morbid obesity (HCC) 01/09/2017  . Pulmonary HTN (HCC) 12/11/2016  . Type 2 diabetes mellitus with complication, with long-term current use of insulin (HCC) 12/11/2016  . Diastolic CHF (HCC) 11/24/2016  .  Insomnia 11/24/2016  . Foot amputation status, left (HCC) 08/22/2016  . Atrial fibrillation (HCC) 06/30/2016  . Essential hypertension 06/30/2016  . Cellulitis of left foot 06/22/2016  . Controlled type 2 diabetes mellitus with hyperglycemia (HCC) 06/22/2016   Past Medical History:  Diagnosis Date  . Atrial fibrillation (HCC)   . Cellulitis and abscess of left leg 06/2016  . CHF (congestive heart failure) (HCC)   . Diabetes (HCC)   . Dyspnea     Family History  Problem Relation Age of Onset  . Diabetes Mellitus II Mother        ESRD  . Diabetes Mellitus II  Brother   . Emphysema Father     Past Surgical History:  Procedure Laterality Date  . AMPUTATION Left 06/24/2016   Procedure: FOOT FIFTH RAY TOE AMPUTATION;  Surgeon: [name], MD;  Location: [facility] OR;  Service: Orthopedics;  Laterality: Left;  . open heart surgery     As a child.  [age] years old.  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   [name] RIGHT/LEFT HEART CATH AND CORONARY ANGIOGRAPHY N/A 03/30/2017   Procedure: Right/Left Heart Cath and Coronary Angiography;  Surgeon: [name], MD;  Location: [facility] INVASIVE CV LAB;  Service: Cardiovascular;  Laterality: N/A;   Social History   Occupational History  . Not on file  Tobacco Use  . Smoking status: Never Smoker  . Smokeless tobacco: Never Used  Substance and Sexual Activity  . Alcohol use: No    Comment: gave up drinking ~20 years ago  . Drug use: No    Types: Marijuana    Comment: not since the 1st of January  . Sexual activity: Not on file

## 2018-02-26 MED FILL — glipiZIDE 5 MG TABS: 5 | 30 days supply | Qty: 30 | Fill #2

## 2018-02-26 MED FILL — ?ATORVASTATIN 20 MG TABLET: 20 | 30 days supply | Qty: 30 | Fill #0

## 2018-02-28 ENCOUNTER — Encounter: Payer: Self-pay | Admitting: Family Medicine

## 2018-02-28 ENCOUNTER — Ambulatory Visit: Payer: Self-pay | Attending: Family Medicine | Admitting: Family Medicine

## 2018-02-28 VITALS — BP 148/68 | HR 59 | Temp 97.9°F | Ht 73.0 in | Wt 268.0 lb

## 2018-02-28 DIAGNOSIS — Z7984 Long term (current) use of oral hypoglycemic drugs: Secondary | ICD-10-CM | POA: Insufficient documentation

## 2018-02-28 DIAGNOSIS — I481 Persistent atrial fibrillation: Secondary | ICD-10-CM | POA: Insufficient documentation

## 2018-02-28 DIAGNOSIS — E1142 Type 2 diabetes mellitus with diabetic polyneuropathy: Secondary | ICD-10-CM | POA: Insufficient documentation

## 2018-02-28 DIAGNOSIS — I110 Hypertensive heart disease with heart failure: Secondary | ICD-10-CM | POA: Insufficient documentation

## 2018-02-28 DIAGNOSIS — Z1211 Encounter for screening for malignant neoplasm of colon: Secondary | ICD-10-CM

## 2018-02-28 DIAGNOSIS — E118 Type 2 diabetes mellitus with unspecified complications: Secondary | ICD-10-CM | POA: Insufficient documentation

## 2018-02-28 DIAGNOSIS — I5032 Chronic diastolic (congestive) heart failure: Secondary | ICD-10-CM | POA: Insufficient documentation

## 2018-02-28 DIAGNOSIS — Z7901 Long term (current) use of anticoagulants: Secondary | ICD-10-CM | POA: Insufficient documentation

## 2018-02-28 DIAGNOSIS — E1149 Type 2 diabetes mellitus with other diabetic neurological complication: Secondary | ICD-10-CM

## 2018-02-28 DIAGNOSIS — Z794 Long term (current) use of insulin: Secondary | ICD-10-CM

## 2018-02-28 DIAGNOSIS — E11621 Type 2 diabetes mellitus with foot ulcer: Secondary | ICD-10-CM | POA: Insufficient documentation

## 2018-02-28 DIAGNOSIS — Z79899 Other long term (current) drug therapy: Secondary | ICD-10-CM | POA: Insufficient documentation

## 2018-02-28 DIAGNOSIS — I272 Pulmonary hypertension, unspecified: Secondary | ICD-10-CM | POA: Insufficient documentation

## 2018-02-28 DIAGNOSIS — I10 Essential (primary) hypertension: Secondary | ICD-10-CM

## 2018-02-28 DIAGNOSIS — I4819 Other persistent atrial fibrillation: Secondary | ICD-10-CM

## 2018-02-28 LAB — GLUCOSE, POCT (MANUAL RESULT ENTRY): POC GLUCOSE: 128 mg/dL — AB (ref 70–99)

## 2018-02-28 LAB — POCT GLYCOSYLATED HEMOGLOBIN (HGB A1C): HbA1c, POC (controlled diabetic range): 6 % (ref 0.0–7.0)

## 2018-02-28 MED ORDER — ATORVASTATIN CALCIUM 20 MG PO TABS: 20.0000 mg | ORAL_TABLET | Freq: Every day | ORAL | 3 refills | Status: DC

## 2018-02-28 MED ORDER — GLIPIZIDE 5 MG PO TABS: 2.5000 mg | ORAL_TABLET | Freq: Two times a day (BID) | ORAL | 3 refills | Status: DC

## 2018-02-28 MED ORDER — METOPROLOL TARTRATE 50 MG PO TABS: 50.0000 mg | ORAL_TABLET | Freq: Two times a day (BID) | ORAL | 3 refills | Status: DC

## 2018-02-28 MED ORDER — GABAPENTIN 300 MG PO CAPS: ORAL_CAPSULE | ORAL | 3 refills | Status: DC

## 2018-02-28 MED ORDER — METFORMIN HCL 500 MG PO TABS: 1000.0000 mg | ORAL_TABLET | Freq: Two times a day (BID) | ORAL | 3 refills | Status: DC
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
## 2018-03-01 LAB — MICROALBUMIN / CREATININE URINE RATIO
CREATININE, UR: 41.3 mg/dL
Microalb/Creat Ratio: 245.3 mg/g creat — ABNORMAL HIGH (ref 0.0–30.0)
Microalbumin, Urine: 101.3 ug/mL

## 2018-03-07 ENCOUNTER — Other Ambulatory Visit: Payer: Self-pay | Admitting: Internal Medicine

## 2018-03-07 DIAGNOSIS — R195 Other fecal abnormalities: Secondary | ICD-10-CM

## 2018-03-07 LAB — FECAL OCCULT BLOOD, IMMUNOCHEMICAL: FECAL OCCULT BLD: POSITIVE — AB

## 2018-03-12 MED FILL — XARELTO 20 MG TABLET: 20 | 30 days supply | Qty: 30 | Fill #4

## 2018-03-14 MED FILL — hydrALAZINE HCL 100 MG TABS: 100 | 30 days supply | Qty: 90 | Fill #1

## 2018-03-16 ENCOUNTER — Telehealth: Payer: Self-pay

## 2018-03-16 NOTE — Telephone Encounter (Signed)
Patient was called and informed of lab results and referral being sent to GI. Patient was also informed to contact office for financial assistance if he has no insurance.

## 2018-03-19 MED FILL — ?METFORMIN HCL 500MG TABS: 500 | 10 days supply | Qty: 30 | Fill #5

## 2018-03-20 ENCOUNTER — Ambulatory Visit (INDEPENDENT_AMBULATORY_CARE_PROVIDER_SITE_OTHER): Payer: Self-pay | Admitting: Orthopedic Surgery

## 2018-03-23 MED FILL — ?AMLODIPINE BESYLATE 5 MG T: 5 MG | 30 days supply | Qty: 30 | Fill #5

## 2018-03-26 MED FILL — ?METFORMIN HCL 500MG TABS: 500 | 30 days supply | Qty: 120 | Fill #0

## 2018-03-26 MED FILL — TORSEMIDE 20 MG TABLET: 20 | 30 days supply | Qty: 60 | Fill #4

## 2018-03-26 MED FILL — ?ATORVASTATIN 20 MG TABLET: 20 | 30 days supply | Qty: 30 | Fill #1

## 2018-04-02 MED FILL — glipiZIDE 5 MG TABS: 5 | 30 days supply | Qty: 30 | Fill #3

## 2018-04-12 MED FILL — XARELTO 20 MG TABLET: 20 | 30 days supply | Qty: 30 | Fill #5

## 2018-04-19 MED FILL — ?METFORMIN HCL 500MG TABS: 500 | 30 days supply | Qty: 120 | Fill #1

## 2018-04-19 MED FILL — hydrALAZINE HCL 100 MG TABS: 100 | 30 days supply | Qty: 90 | Fill #2

## 2018-04-19 MED FILL — TORSEMIDE 20 MG TABLET: 20 | 30 days supply | Qty: 60 | Fill #5

## 2018-04-19 MED FILL — ?AMLODIPINE BESYLATE 5 MG T: 5 MG | 30 days supply | Qty: 30 | Fill #6

## 2018-04-19 MED FILL — ?ATORVASTATIN 20 MG TABLET: 20 | 30 days supply | Qty: 30 | Fill #2

## 2018-04-25 ENCOUNTER — Ambulatory Visit: Payer: Self-pay | Admitting: Vascular Surgery

## 2018-04-25 ENCOUNTER — Encounter (HOSPITAL_COMMUNITY): Payer: Self-pay

## 2018-05-01 ENCOUNTER — Other Ambulatory Visit: Payer: Self-pay | Admitting: Family Medicine

## 2018-05-01 DIAGNOSIS — E118 Type 2 diabetes mellitus with unspecified complications: Secondary | ICD-10-CM

## 2018-05-01 MED FILL — glipiZIDE 5 MG TABS: 5 | 30 days supply | Qty: 30 | Fill #0

## 2018-05-11 MED FILL — XARELTO 20 MG TABLET: 20 | 30 days supply | Qty: 30 | Fill #6

## 2018-05-15 ENCOUNTER — Ambulatory Visit (HOSPITAL_COMMUNITY)
Admission: RE | Admit: 2018-05-15 | Discharge: 2018-05-15 | Disposition: A | Discharge status: Home / Self Care | Payer: Self-pay | Source: Ambulatory Visit | Attending: Vascular Surgery | Admitting: Vascular Surgery

## 2018-05-15 ENCOUNTER — Ambulatory Visit (INDEPENDENT_AMBULATORY_CARE_PROVIDER_SITE_OTHER): Payer: Self-pay | Admitting: Vascular Surgery

## 2018-05-15 ENCOUNTER — Encounter: Payer: Self-pay | Admitting: Vascular Surgery

## 2018-05-15 ENCOUNTER — Other Ambulatory Visit: Payer: Self-pay

## 2018-05-15 VITALS — BP 136/63 | HR 63 | Temp 99.3°F | Resp 18 | Ht 73.0 in | Wt 262.0 lb

## 2018-05-15 DIAGNOSIS — I7025 Atherosclerosis of native arteries of other extremities with ulceration: Secondary | ICD-10-CM | POA: Insufficient documentation

## 2018-05-15 DIAGNOSIS — I739 Peripheral vascular disease, unspecified: Secondary | ICD-10-CM

## 2018-05-15 DIAGNOSIS — I10 Essential (primary) hypertension: Secondary | ICD-10-CM | POA: Insufficient documentation

## 2018-05-15 DIAGNOSIS — E1151 Type 2 diabetes mellitus with diabetic peripheral angiopathy without gangrene: Secondary | ICD-10-CM | POA: Insufficient documentation
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
## 2018-05-21 MED FILL — hydrALAZINE HCL 100 MG TABS: 100 | 30 days supply | Qty: 90 | Fill #3

## 2018-05-22 MED FILL — ?AMLODIPINE BESYLATE 5 MG T: 5 MG | 30 days supply | Qty: 30 | Fill #7

## 2018-05-25 MED FILL — ?ATORVASTATIN 20 MG TABLET: 20 | 30 days supply | Qty: 30 | Fill #3

## 2018-06-04 ENCOUNTER — Ambulatory Visit: Payer: Self-pay | Admitting: Family Medicine

## 2018-06-04 MED FILL — ?METFORMIN HCL 500MG TABS: 500 | 30 days supply | Qty: 120 | Fill #2

## 2018-06-04 MED FILL — ?GLIPIZIDE 5MG TABLET: 5 | 30 days supply | Qty: 30 | Fill #1

## 2018-06-04 MED FILL — TORSEMIDE 20 MG TABLET: 20 | 30 days supply | Qty: 60 | Fill #6

## 2018-06-12 MED FILL — XARELTO 20 MG TABLET: 20 | 30 days supply | Qty: 30 | Fill #7

## 2018-06-20 ENCOUNTER — Other Ambulatory Visit (HOSPITAL_COMMUNITY): Payer: Self-pay | Admitting: Cardiology

## 2018-06-20 DIAGNOSIS — I10 Essential (primary) hypertension: Secondary | ICD-10-CM

## 2018-06-20 DIAGNOSIS — Z0279 Encounter for issue of other medical certificate: Secondary | ICD-10-CM

## 2018-06-20 MED FILL — hydrALAZINE HCL 100 MG TABS: 100 | 30 days supply | Qty: 90 | Fill #0

## 2018-06-20 MED FILL — ?AMLODIPINE BESYLATE 5 MG T: 5 MG | 30 days supply | Qty: 30 | Fill #8

## 2018-06-25 MED FILL — ?ATORVASTATIN 20 MG TABLET: 20 | 30 days supply | Qty: 30 | Fill #0

## 2018-07-03 MED FILL — ?METFORMIN HCL 500MG TABS: 500 | 30 days supply | Qty: 120 | Fill #3

## 2018-07-03 MED FILL — TORSEMIDE 20 MG TABLET: 20 | 30 days supply | Qty: 60 | Fill #7

## 2018-07-03 MED FILL — ?GLIPIZIDE 5MG TABLET: 5 | 30 days supply | Qty: 30 | Fill #2

## 2018-07-06 ENCOUNTER — Telehealth (HOSPITAL_COMMUNITY): Payer: Self-pay | Admitting: *Deleted

## 2018-07-06 NOTE — Telephone Encounter (Signed)
Med records faxed to disability per pt signed medical records release form.  

## 2018-07-13 MED FILL — XARELTO 20 MG TABLET: 20 | 30 days supply | Qty: 30 | Fill #8

## 2018-07-23 MED FILL — ?AMLODIPINE BESYLATE 5 MG T: 5 MG | 30 days supply | Qty: 30 | Fill #9

## 2018-07-23 MED FILL — ?ATORVASTATIN 20 MG TABLET: 20 | 30 days supply | Qty: 30 | Fill #1

## 2018-07-23 MED FILL — ?METOPROLOL 50 MG TABLET: 50 | 30 days supply | Qty: 60 | Fill #2

## 2018-07-23 MED FILL — hydrALAZINE HCL 100 MG TABS: 100 | 30 days supply | Qty: 90 | Fill #1

## 2018-08-02 MED FILL — ?GLIPIZIDE 5MG TABLET: 5 | 30 days supply | Qty: 30 | Fill #3

## 2018-08-02 MED FILL — ?METFORMIN HCL 500MG TABS: 500 | 30 days supply | Qty: 120 | Fill #0

## 2018-08-06 ENCOUNTER — Ambulatory Visit: Payer: PRIVATE HEALTH INSURANCE | Admitting: Family Medicine

## 2018-08-06 ENCOUNTER — Other Ambulatory Visit: Payer: Self-pay

## 2018-08-06 DIAGNOSIS — I7025 Atherosclerosis of native arteries of other extremities with ulceration: Secondary | ICD-10-CM

## 2018-08-06 DIAGNOSIS — I739 Peripheral vascular disease, unspecified: Secondary | ICD-10-CM

## 2018-08-13 ENCOUNTER — Telehealth (HOSPITAL_COMMUNITY): Payer: Self-pay | Admitting: Surgery

## 2018-08-13 MED FILL — XARELTO 20 MG TABLET: 20 | 30 days supply | Qty: 30 | Fill #9

## 2018-08-13 MED FILL — TORSEMIDE 20 MG TABLET: 20 | 30 days supply | Qty: 60 | Fill #8

## 2018-08-13 NOTE — Telephone Encounter (Signed)
Attempted to contact patient to confirm appointments for 08/14/2018 at 11:45 am 08/13/2018

## 2018-08-14 ENCOUNTER — Ambulatory Visit (INDEPENDENT_AMBULATORY_CARE_PROVIDER_SITE_OTHER): Payer: PRIVATE HEALTH INSURANCE | Admitting: Vascular Surgery

## 2018-08-14 ENCOUNTER — Ambulatory Visit (HOSPITAL_COMMUNITY)
Admission: RE | Admit: 2018-08-14 | Discharge: 2018-08-14 | Disposition: A | Discharge status: Home / Self Care | Payer: Medicaid Other | Source: Ambulatory Visit | Attending: Vascular Surgery | Admitting: Vascular Surgery

## 2018-08-14 ENCOUNTER — Encounter: Payer: Self-pay | Admitting: Vascular Surgery

## 2018-08-14 ENCOUNTER — Other Ambulatory Visit: Payer: Self-pay

## 2018-08-14 VITALS — BP 134/77 | HR 54 | Resp 188 | Ht 73.0 in | Wt 271.0 lb

## 2018-08-14 DIAGNOSIS — I739 Peripheral vascular disease, unspecified: Secondary | ICD-10-CM

## 2018-08-14 DIAGNOSIS — I7025 Atherosclerosis of native arteries of other extremities with ulceration: Secondary | ICD-10-CM | POA: Insufficient documentation
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
## 2018-08-17 ENCOUNTER — Ambulatory Visit: Payer: Self-pay | Attending: Family Medicine

## 2018-08-21 MED FILL — METOPROLOL TARTRATE 50 MG T: 50 | 30 days supply | Qty: 60 | Fill #3

## 2018-08-21 MED FILL — AMLODIPINE BESYLATE 5 MG TA: 5 | 30 days supply | Qty: 30 | Fill #10

## 2018-08-23 ENCOUNTER — Telehealth: Payer: Self-pay | Admitting: Family Medicine

## 2018-08-23 NOTE — Telephone Encounter (Signed)
I try to contact Pt but did not answer, I have no idea why he said I cal, since I did not contact him

## 2018-08-24 MED FILL — ?ATORVASTATIN 20 MG TABLET: 20 | 30 days supply | Qty: 30 | Fill #2

## 2018-08-31 ENCOUNTER — Other Ambulatory Visit: Payer: Self-pay | Admitting: Family Medicine

## 2018-08-31 DIAGNOSIS — E118 Type 2 diabetes mellitus with unspecified complications: Secondary | ICD-10-CM

## 2018-08-31 MED FILL — hydrALAZINE HCL 100 MG TABS: 100 | 30 days supply | Qty: 90 | Fill #2

## 2018-08-31 MED FILL — ?METFORMIN HCL 500MG TABLET: 500 | 30 days supply | Qty: 120 | Fill #1

## 2018-09-03 MED FILL — ?GLIPIZIDE 5MG TABLET: 5 | 30 days supply | Qty: 30 | Fill #0

## 2018-09-10 ENCOUNTER — Ambulatory Visit: Payer: Medicaid Other | Attending: Family Medicine | Admitting: Family Medicine

## 2018-09-10 ENCOUNTER — Encounter: Payer: Self-pay | Admitting: Family Medicine

## 2018-09-10 VITALS — BP 135/68 | HR 60 | Temp 98.2°F | Ht 73.0 in | Wt 270.6 lb

## 2018-09-10 DIAGNOSIS — I739 Peripheral vascular disease, unspecified: Secondary | ICD-10-CM

## 2018-09-10 DIAGNOSIS — I110 Hypertensive heart disease with heart failure: Secondary | ICD-10-CM | POA: Diagnosis not present

## 2018-09-10 DIAGNOSIS — Z79899 Other long term (current) drug therapy: Secondary | ICD-10-CM | POA: Insufficient documentation

## 2018-09-10 DIAGNOSIS — E118 Type 2 diabetes mellitus with unspecified complications: Secondary | ICD-10-CM

## 2018-09-10 DIAGNOSIS — I272 Pulmonary hypertension, unspecified: Secondary | ICD-10-CM | POA: Diagnosis not present

## 2018-09-10 DIAGNOSIS — E1151 Type 2 diabetes mellitus with diabetic peripheral angiopathy without gangrene: Secondary | ICD-10-CM | POA: Insufficient documentation

## 2018-09-10 DIAGNOSIS — I480 Paroxysmal atrial fibrillation: Secondary | ICD-10-CM

## 2018-09-10 DIAGNOSIS — L97511 Non-pressure chronic ulcer of other part of right foot limited to breakdown of skin: Secondary | ICD-10-CM | POA: Insufficient documentation

## 2018-09-10 DIAGNOSIS — Z7984 Long term (current) use of oral hypoglycemic drugs: Secondary | ICD-10-CM | POA: Diagnosis not present

## 2018-09-10 DIAGNOSIS — I5042 Chronic combined systolic (congestive) and diastolic (congestive) heart failure: Secondary | ICD-10-CM

## 2018-09-10 DIAGNOSIS — Z7901 Long term (current) use of anticoagulants: Secondary | ICD-10-CM | POA: Insufficient documentation

## 2018-09-10 DIAGNOSIS — I10 Essential (primary) hypertension: Secondary | ICD-10-CM

## 2018-09-10 DIAGNOSIS — E11621 Type 2 diabetes mellitus with foot ulcer: Secondary | ICD-10-CM | POA: Insufficient documentation

## 2018-09-10 DIAGNOSIS — E1165 Type 2 diabetes mellitus with hyperglycemia: Secondary | ICD-10-CM

## 2018-09-10 LAB — POCT GLYCOSYLATED HEMOGLOBIN (HGB A1C): HBA1C, POC (CONTROLLED DIABETIC RANGE): 5.5 % (ref 0.0–7.0)

## 2018-09-10 LAB — GLUCOSE, POCT (MANUAL RESULT ENTRY): POC Glucose: 101 mg/dl — AB (ref 70–99)

## 2018-09-10 MED ORDER — METFORMIN HCL 500 MG PO TABS: 1000.0000 mg | ORAL_TABLET | Freq: Two times a day (BID) | ORAL | 6 refills | Status: DC

## 2018-09-10 MED ORDER — GLIPIZIDE 5 MG PO TABS: 5.0000 mg | ORAL_TABLET | Freq: Every day | ORAL | 6 refills | Status: DC

## 2018-09-10 MED ORDER — METOPROLOL TARTRATE 50 MG PO TABS: 50.0000 mg | ORAL_TABLET | Freq: Two times a day (BID) | ORAL | 6 refills | Status: DC

## 2018-09-10 MED ORDER — ATORVASTATIN CALCIUM 20 MG PO TABS: 20.0000 mg | ORAL_TABLET | Freq: Every day | ORAL | 6 refills | Status: DC

## 2018-09-10 MED ORDER — RIVAROXABAN 20 MG PO TABS: ORAL_TABLET | ORAL | 3 refills | Status: DC

## 2018-09-10 NOTE — Progress Notes (Signed)
Subjective:  Patient ID: Paul Mclaughlin, male    DOB: December 26, 1955  Age: 62 y.o. MRN: [id number]  CC: Diabetes and Leg Pain   HPI Paul Mclaughlin is a 62 year old male with a history of type 2 diabetes mellitus (A1c 5.5), hypertension, atrial fibrillation (on anticoagulation with Xarelto and rate control with metoprolol), history of Congenital heart disease status post surgery, Pulmonary hypertension, congestive heart failure ( EF of 55-60% from 2-D echo 11/2016), peripheral arterial disease here for a follow-up visit. His A1c is 5.5 and he denies hypoglycemia, blurry vision or numbness in extremities.  Endorses compliance with his medications.  He does have a chronic right great toe ulcer after a burn injury which has been present for the last 9 months for which he was followed by Dr. Lajoyce Mclaughlin. Seen by vascular surgery 3 weeks ago for his peripheral vascular disease and ABI revealed right lower extremity moderate disease but no disease on the left.  He again declined arteriogram due to cost. With regards to his CHF he has not had recent dyspnea and his exercise tolerance is good.  Denies orthopnea, pedal edema, paroxysmal nocturnal dyspnea.  Past Medical History:  Diagnosis Date  . Atrial fibrillation (HCC)   . Cellulitis and abscess of left leg 06/2016  . CHF (congestive heart failure) (HCC)   . Diabetes (HCC)   . Dyspnea     Past Surgical History:  Procedure Laterality Date  . AMPUTATION Left 06/24/2016   Procedure: FOOT FIFTH RAY TOE AMPUTATION;  Surgeon: [name], MD;  Location: [facility] OR;  Service: Orthopedics;  Laterality: Left;  . open heart surgery     As a child.  [age] years old.  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   Paul Mclaughlin RIGHT/LEFT HEART CATH AND CORONARY ANGIOGRAPHY N/A 03/30/2017   Procedure: Right/Left Heart Cath and Coronary Angiography;  Surgeon: [name], MD;  Location: [facility] INVASIVE CV LAB;  Service: Cardiovascular;  Laterality: N/A;     Outpatient Medications Prior to Visit  Medication  Sig Dispense Refill  . albuterol (PROVENTIL HFA;VENTOLIN HFA) 108 (90 Base) MCG/ACT inhaler Inhale 2 puffs into the lungs every 6 (six) hours as needed for wheezing or shortness of breath. 1 Inhaler 0  . cetirizine (ZYRTEC) 10 MG tablet Take 1 tablet (10 mg total) by mouth daily. 30 tablet 1  . gabapentin (NEURONTIN) 300 MG capsule TAKE 1 CAPSULE BY MOUTH 2 TIMES DAILY. 60 capsule 3  . hydrALAZINE (APRESOLINE) 100 MG tablet TAKE 1 TABLET BY MOUTH 3 TIMES DAILY. 90 tablet 3  . torsemide (DEMADEX) 20 MG tablet Alternate 60 mg (3 tabs), and 40 mg (2 tabs) daily 75 tablet 11  . atorvastatin (LIPITOR) 20 MG tablet Take 1 tablet (20 mg total) by mouth daily. 30 tablet 3  . glipiZIDE (GLUCOTROL) 5 MG tablet TAKE 1/2 TABLET BY MOUTH 2 (TWO) TIMES DAILY BEFORE A MEAL. 30 tablet 0  . metFORMIN (GLUCOPHAGE) 500 MG tablet Take 2 tablets (1,000 mg total) by mouth 2 (two) times daily with a meal. 120 tablet 3  . metoprolol tartrate (LOPRESSOR) 50 MG tablet Take 1 tablet (50 mg total) by mouth 2 (two) times daily. 60 tablet 3  . rivaroxaban (XARELTO) 20 MG TABS tablet TAKE 1 TABLET BY MOUTH DAILY WITH SUPPER. 90 tablet 3  . amLODipine (NORVASC) 5 MG tablet Take 1 tablet (5 mg total) by mouth daily. 30 tablet 11  . cephALEXin (KEFLEX) 500 MG capsule Take 1 capsule (500 mg total) by mouth 3 (three) times  daily. (Patient not taking: Reported on 02/28/2018) 21 capsule 0   No facility-administered medications prior to visit.     ROS Review of Systems  Constitutional: Negative for activity change and appetite change.  HENT: Negative for sinus pressure and sore throat.   Eyes: Negative for visual disturbance.  Respiratory: Negative for cough, chest tightness and shortness of breath.   Cardiovascular: Negative for chest pain and leg swelling.  Gastrointestinal: Negative for abdominal distention, abdominal pain, constipation and diarrhea.  Endocrine: Negative.   Genitourinary: Negative for dysuria.    Musculoskeletal: Negative for joint swelling and myalgias.  Skin: Positive for wound. Negative for rash.  Allergic/Immunologic: Negative.   Neurological: Negative for weakness, light-headedness and numbness.  Psychiatric/Behavioral: Negative for dysphoric mood and suicidal ideas.    Objective:  BP 135/68   Pulse 60   Temp 98.2 F (36.8 C) (Oral)   Ht 6\' 1"  (1.854 m)   Wt 270 lb 9.6 oz (122.7 kg)   SpO2 94%   BMI 35.70 kg/m   BP/Weight 09/10/2018 08/14/2018 05/15/2018  Systolic BP 135 134 136  Diastolic BP 68 77 63  Wt. (Lbs) 270.6 271 262  BMI 35.7 35.75 34.57      Physical Exam Constitutional:      Appearance: He is well-developed.  Cardiovascular:     Rate and Rhythm: Normal rate.     Heart sounds: Normal heart sounds. No murmur.     Comments: Absent right dorsalis pedis and posterior tibialis Present left dorsalis pedis and posterior tibialis Pulmonary:     Effort: Pulmonary effort is normal.     Breath sounds: Normal breath sounds. No wheezing or rales.  Chest:     Chest wall: No tenderness.  Abdominal:     General: Bowel sounds are normal. There is no distension.     Palpations: Abdomen is soft. There is no mass.     Tenderness: There is no abdominal tenderness.  Musculoskeletal: Normal range of motion.  Skin:    Comments: Base of right great toe with ulcer, dry skin surrounding, no discharge  Neurological:     Mental Status: He is alert and oriented to person, place, and time.  Psychiatric:        Mood and Affect: Mood normal.        Behavior: Behavior normal.     CMP Latest Ref Rng & Units 12/22/2017 12/08/2017 11/01/2017  Glucose 65 - 99 mg/dL 161(W120(H) 960(A124(H) 540(J130(H)  BUN 6 - 20 mg/dL 81(X31(H) 91(Y21(H) 20  Creatinine 0.61 - 1.24 mg/dL 7.821.23 9.561.08 2.131.21  Sodium 135 - 145 mmol/L 136 138 135  Potassium 3.5 - 5.1 mmol/L 4.5 4.0 4.2  Chloride 101 - 111 mmol/L 104 103 98(L)  CO2 22 - 32 mmol/L 23 25 25   Calcium 8.9 - 10.3 mg/dL 9.3 9.4 9.3  Total Protein 6.0 - 8.5  g/dL - - -  Total Bilirubin 0.0 - 1.2 mg/dL - - -  Alkaline Phos 39 - 117 IU/L - - -  AST 0 - 40 IU/L - - -  ALT 0 - 44 IU/L - - -    Lipid Panel     Component Value Date/Time   CHOL 105 10/03/2017 1457   TRIG 68 10/03/2017 1457   HDL 45 10/03/2017 1457   CHOLHDL 2.3 10/03/2017 1457   VLDL 14 10/03/2017 1457   LDLCALC 46 10/03/2017 1457    Lab Results  Component Value Date   HGBA1C 5.5 09/10/2018     Assessment &  Plan:   1. Type 2 diabetes mellitus with complication, without long-term current use of insulin (HCC) Controlled with A1c of 5.5 No hypoglycemic episodes Continue current regimen, lifestyle modifications and diabetic diet - POCT glucose (manual entry) - POCT glycosylated hemoglobin (Hb A1C) - atorvastatin (LIPITOR) 20 MG tablet; Take 1 tablet (20 mg total) by mouth daily.  Dispense: 30 tablet; Refill: 6 - glipiZIDE (GLUCOTROL) 5 MG tablet; Take 1 tablet (5 mg total) by mouth daily before breakfast.  Dispense: 30 tablet; Refill: 6 - metFORMIN (GLUCOPHAGE) 500 MG tablet; Take 2 tablets (1,000 mg total) by mouth 2 (two) times daily with a meal.  Dispense: 120 tablet; Refill: 6  2. Essential hypertension Controlled Counseled on blood pressure goal of less than 130/80, low-sodium, DASH diet, medication compliance, 150 minutes of moderate intensity exercise per week. Discussed medication compliance, adverse effects. - metoprolol tartrate (LOPRESSOR) 50 MG tablet; Take 1 tablet (50 mg total) by mouth 2 (two) times daily.  Dispense: 60 tablet; Refill: 6  3. Peripheral arterial disease (HCC) With right great toe ulcer ABI from 3 weeks ago reveal moderate right lower extremity arterial disease Declines arteriogram due to cost; discussed the implications of his declining and intervention as this places him at risk for amputations Currently followed by vascular  4. Hypertensive heart disease with chronic combined systolic and diastolic congestive heart failure (HCC) EF  55 to 60% Euvolemic Continue beta-blocker, ACE inhibitor Followed by cardiology  5. Ulcer of toe of right foot, limited to breakdown of skin (HCC) Ulcer has been present for the last 9 months Seen by orthopedics and also vascular PAD likely contributing to slow healing  6. Paroxysmal atrial fibrillation (HCC) Stable - rivaroxaban (XARELTO) 20 MG TABS tablet; TAKE 1 TABLET BY MOUTH DAILY WITH SUPPER.  Dispense: 90 tablet; Refill: 3   Meds ordered this encounter  Medications  . atorvastatin (LIPITOR) 20 MG tablet    Sig: Take 1 tablet (20 mg total) by mouth daily.    Dispense:  30 tablet    Refill:  6  . glipiZIDE (GLUCOTROL) 5 MG tablet    Sig: Take 1 tablet (5 mg total) by mouth daily before breakfast.    Dispense:  30 tablet    Refill:  6  . rivaroxaban (XARELTO) 20 MG TABS tablet    Sig: TAKE 1 TABLET BY MOUTH DAILY WITH SUPPER.    Dispense:  90 tablet    Refill:  3  . metoprolol tartrate (LOPRESSOR) 50 MG tablet    Sig: Take 1 tablet (50 mg total) by mouth 2 (two) times daily.    Dispense:  60 tablet    Refill:  6  . metFORMIN (GLUCOPHAGE) 500 MG tablet    Sig: Take 2 tablets (1,000 mg total) by mouth 2 (two) times daily with a meal.    Dispense:  120 tablet    Refill:  6    Follow-up: Return in about 3 months (around 12/10/2018) for follow up of chronic medical conditions.   Hoy RegisterEnobong Dosia Yodice MD

## 2018-09-13 MED FILL — XARELTO 20 MG TABLET: 20 | 30 days supply | Qty: 30 | Fill #0

## 2018-09-18 ENCOUNTER — Ambulatory Visit: Payer: Self-pay | Attending: Family Medicine

## 2018-09-24 MED FILL — ?AMLODIPINE BESYLATE 5 MG T: 5 MG | 30 days supply | Qty: 30 | Fill #11

## 2018-09-24 MED FILL — ?ATORVASTATIN 20 MG TABLET: 20 | 30 days supply | Qty: 30 | Fill #3

## 2018-09-24 MED FILL — METOPROLOL TARTRATE 50 MG T: 50 | 30 days supply | Qty: 60 | Fill #0

## 2018-10-03 MED FILL — ?METFORMIN HCL 500MG TABLET: 500 | 30 days supply | Qty: 120 | Fill #2

## 2018-10-16 MED FILL — XARELTO 20 MG TABLET: 20 | 30 days supply | Qty: 30 | Fill #1

## 2018-10-16 MED FILL — ?GLIPIZIDE 5MG TABLET: 5 | 30 days supply | Qty: 30 | Fill #0

## 2018-10-16 MED FILL — TORSEMIDE 20 MG TABLET: 20 | 30 days supply | Qty: 60 | Fill #9

## 2018-10-16 MED FILL — hydrALAZINE HCL 100 MG TABS: 100 | 30 days supply | Qty: 90 | Fill #3

## 2018-10-22 MED FILL — ATORVASTATIN 20 MG TABLET: 20 | 30 days supply | Qty: 30 | Fill #0

## 2018-10-26 MED FILL — ?METOPROLOL 50 MG TABLET: 50 | 30 days supply | Qty: 60 | Fill #1

## 2018-11-05 MED FILL — ?METFORMIN HCL 500MG TABL: 500 | 30 days supply | Qty: 120 | Fill #3

## 2018-11-09 ENCOUNTER — Other Ambulatory Visit: Payer: Self-pay | Admitting: Pharmacist

## 2018-11-09 NOTE — Telephone Encounter (Signed)
Received request from [facility] pharmacy to refill amlodipine. Reviewed PCP note from 09/10/18 - amlodipine listed in medication regimen, however, it was previously filled by a hospital provider outside of our clinic protocol. Will send to PCP for approval.

## 2018-11-13 ENCOUNTER — Other Ambulatory Visit: Payer: Self-pay

## 2018-11-13 DIAGNOSIS — I7025 Atherosclerosis of native arteries of other extremities with ulceration: Secondary | ICD-10-CM

## 2018-11-13 DIAGNOSIS — I739 Peripheral vascular disease, unspecified: Secondary | ICD-10-CM

## 2018-11-13 MED ORDER — AMLODIPINE BESYLATE 5 MG PO TABS: 5.0000 mg | ORAL_TABLET | Freq: Every day | ORAL | 2 refills | Status: DC

## 2018-11-14 MED FILL — XARELTO 20 MG TABLET: 20 | 30 days supply | Qty: 30 | Fill #2

## 2018-11-20 ENCOUNTER — Ambulatory Visit (HOSPITAL_COMMUNITY): Payer: Self-pay | Attending: Vascular Surgery

## 2018-11-20 ENCOUNTER — Encounter: Payer: Self-pay | Admitting: Family

## 2018-11-20 ENCOUNTER — Ambulatory Visit: Payer: Self-pay | Admitting: Vascular Surgery

## 2018-11-22 MED FILL — ?ATORVASTATIN 20 MG TABLET: 20 | 30 days supply | Qty: 30 | Fill #1

## 2018-11-22 MED FILL — ?GLIPIZIDE 5MG TABLET: 5 | 30 days supply | Qty: 30 | Fill #1

## 2018-12-03 MED FILL — ?METOPROLOL 50 MG TABLET: 50 | 30 days supply | Qty: 60 | Fill #2

## 2018-12-04 ENCOUNTER — Other Ambulatory Visit (HOSPITAL_COMMUNITY): Payer: Self-pay | Admitting: Cardiology

## 2018-12-04 DIAGNOSIS — I10 Essential (primary) hypertension: Secondary | ICD-10-CM

## 2018-12-04 MED FILL — metFORMIN HCL 500 MG TABS: 500 | 30 days supply | Qty: 120 | Fill #0

## 2018-12-12 ENCOUNTER — Other Ambulatory Visit: Payer: Self-pay

## 2018-12-12 ENCOUNTER — Ambulatory Visit: Payer: Self-pay | Attending: Family Medicine | Admitting: Family Medicine

## 2018-12-12 ENCOUNTER — Encounter: Payer: Self-pay | Admitting: Family Medicine

## 2018-12-12 DIAGNOSIS — I10 Essential (primary) hypertension: Secondary | ICD-10-CM

## 2018-12-12 DIAGNOSIS — I5031 Acute diastolic (congestive) heart failure: Secondary | ICD-10-CM

## 2018-12-12 DIAGNOSIS — I480 Paroxysmal atrial fibrillation: Secondary | ICD-10-CM

## 2018-12-12 DIAGNOSIS — E118 Type 2 diabetes mellitus with unspecified complications: Secondary | ICD-10-CM

## 2018-12-12 MED ORDER — TORSEMIDE 20 MG PO TABS: ORAL_TABLET | ORAL | 6 refills | Status: DC

## 2018-12-12 MED ORDER — ATORVASTATIN CALCIUM 20 MG PO TABS: 20.0000 mg | ORAL_TABLET | Freq: Every day | ORAL | 6 refills | Status: DC

## 2018-12-12 MED ORDER — AMLODIPINE BESYLATE 5 MG PO TABS: 5.0000 mg | ORAL_TABLET | Freq: Every day | ORAL | 6 refills | Status: DC

## 2018-12-12 MED ORDER — GLIPIZIDE 5 MG PO TABS: 5.0000 mg | ORAL_TABLET | Freq: Every day | ORAL | 6 refills | Status: DC

## 2018-12-12 MED ORDER — HYDRALAZINE HCL 100 MG PO TABS: 100.0000 mg | ORAL_TABLET | Freq: Three times a day (TID) | ORAL | 6 refills | Status: DC

## 2018-12-12 MED ORDER — METOPROLOL TARTRATE 50 MG PO TABS: 50.0000 mg | ORAL_TABLET | Freq: Two times a day (BID) | ORAL | 6 refills | Status: DC

## 2018-12-12 MED ORDER — METFORMIN HCL 500 MG PO TABS: 1000.0000 mg | ORAL_TABLET | Freq: Two times a day (BID) | ORAL | 6 refills | Status: DC

## 2018-12-12 MED ORDER — RIVAROXABAN 20 MG PO TABS: ORAL_TABLET | ORAL | 1 refills | Status: DC

## 2018-12-12 MED FILL — hydrALAZINE HCL 100 MG TABS: 100 | 30 days supply | Qty: 90 | Fill #0

## 2018-12-12 MED FILL — ?AMLODIPINE BESYLATE 5MG TA: 5 | 30 days supply | Qty: 30 | Fill #0

## 2018-12-12 MED FILL — XARELTO 20 MG TABLET: 20 | 30 days supply | Qty: 30 | Fill #3

## 2018-12-12 NOTE — Progress Notes (Signed)
Patient has been called and DOB has been verified. Patient has been screened and transferred to PCP to start phone visit.  C/C: Diabetes  Refills: torsemide

## 2018-12-12 NOTE — Progress Notes (Signed)
Virtual Visit via Telephone Note  I connected with Paul Mclaughlin on 12/12/18 at  2:10 PM EDT by telephone and verified that I am speaking with the correct person using two identifiers.   I discussed the limitations, risks, security and privacy concerns of performing an evaluation and management service by telephone and the availability of in person appointments. I also discussed with the patient that there may be a patient responsible charge related to this service. The patient expressed understanding and agreed to proceed.   History of Present Illness: Paul Mclaughlin is a 63 year old male with a history of type 2 diabetes mellitus (A1c 5.5), hypertension, atrial fibrillation (on anticoagulation with Xarelto and rate control with metoprolol), history of Congenital heart disease status post surgery, Pulmonary hypertension, congestive heart failure ( EF of 55-60% from 2-D echo 11/2016), peripheral arterial disease here for a follow-up visit.  He has had lower extremity edema which has been intermittent and resolves when he elevates his feet.  He endorses presence of a knot in his left calf but no significant cough pain.  He has not been compliant with prescribed dose of torsemide due to financial constraints and has been stretching his torsemide. He remains dyspneic on mild exertion which is his baseline.  Of note his weight this morning was 273 pounds and was 270 pounds 3 months ago. With regards to his diabetes mellitus he denies hypoglycemia and for his neuropathy he is on gabapentin however he has not been taking it due to cost.  He has no additional concerns today.   Observations/Objective: AAOX3 NAD  CMP Latest Ref Rng & Units 12/22/2017 12/08/2017 11/01/2017  Glucose 65 - 99 mg/dL 120(H) 124(H) 130(H)  BUN 6 - 20 mg/dL 31(H) 21(H) 20  Creatinine 0.61 - 1.24 mg/dL 1.23 1.08 1.21  Sodium 135 - 145 mmol/L 136 138 135  Potassium 3.5 - 5.1 mmol/L 4.5 4.0 4.2  Chloride 101 - 111 mmol/L 104  103 98(L)  CO2 22 - 32 mmol/L 23 25 25   Calcium 8.9 - 10.3 mg/dL 9.3 9.4 9.3  Total Protein 6.0 - 8.5 g/dL - - -  Total Bilirubin 0.0 - 1.2 mg/dL - - -  Alkaline Phos 39 - 117 IU/L - - -  AST 0 - 40 IU/L - - -  ALT 0 - 44 IU/L - - -    Lipid Panel     Component Value Date/Time   CHOL 105 10/03/2017 1457   TRIG 68 10/03/2017 1457   HDL 45 10/03/2017 1457   CHOLHDL 2.3 10/03/2017 1457   VLDL 14 10/03/2017 1457   LDLCALC 46 10/03/2017 1457    Lab Results  Component Value Date   HGBA1C 5.5 09/10/2018     Assessment and Plan: 1. Type 2 diabetes mellitus with complication, without long-term current use of insulin (HCC) Controlled with A1c of 5.5 He will come in for labs tomorrow morning Continue current management - atorvastatin (LIPITOR) 20 MG tablet; Take 1 tablet (20 mg total) by mouth daily.  Dispense: 30 tablet; Refill: 6 - glipiZIDE (GLUCOTROL) 5 MG tablet; Take 1 tablet (5 mg total) by mouth daily before breakfast.  Dispense: 30 tablet; Refill: 6 - metFORMIN (GLUCOPHAGE) 500 MG tablet; Take 2 tablets (1,000 mg total) by mouth 2 (two) times daily with a meal.  Dispense: 120 tablet; Refill: 6 - CMP14+EGFR; Future - Lipid panel; Future - Microalbumin / creatinine urine ratio; Future  2. Essential hypertension Unable to assess blood control due to virtual visit  Counseled on blood pressure goal of less than 130/80, low-sodium, DASH diet, medication compliance, 150 minutes of moderate intensity exercise per week. Discussed medication compliance, adverse effects. - amLODipine (NORVASC) 5 MG tablet; Take 1 tablet (5 mg total) by mouth daily.  Dispense: 30 tablet; Refill: 6 - hydrALAZINE (APRESOLINE) 100 MG tablet; Take 1 tablet (100 mg total) by mouth 3 (three) times daily.  Dispense: 90 tablet; Refill: 6 - metoprolol tartrate (LOPRESSOR) 50 MG tablet; Take 1 tablet (50 mg total) by mouth 2 (two) times daily.  Dispense: 60 tablet; Refill: 6  3. Paroxysmal atrial fibrillation  (HCC) Continue anticoagulation with Xarelto and rate control with metoprolol - rivaroxaban (XARELTO) 20 MG TABS tablet; TAKE 1 TABLET BY MOUTH DAILY WITH SUPPER.  Dispense: 90 tablet; Refill: 1  4. Acute diastolic congestive heart failure (HCC) EF 55 to 60% from echo of 11/2016 Dyspnea is at baseline but edema is suspicious for fluid overload especially since he has been stretching his torsemide pills however weight has not changed much Financial constraints is a major limiting factor Advised to be more compliant with his medications and check his weight daily He is to notify the clinic if symptoms worsen - torsemide (DEMADEX) 20 MG tablet; Alternate 60 mg (3 tabs), and 40 mg (2 tabs) daily  Dispense: 75 tablet; Refill: 6 - Brain natriuretic peptide; Future   Follow Up Instructions:    I discussed the assessment and treatment plan with the patient. The patient was provided an opportunity to ask questions and all were answered. The patient agreed with the plan and demonstrated an understanding of the instructions.   The patient was advised to call back or seek an in-person evaluation if the symptoms worsen or if the condition fails to improve as anticipated.  I provided 13 minutes of non-face-to-face time during this encounter.   Charlott Rakes, MD

## 2018-12-13 ENCOUNTER — Ambulatory Visit: Payer: Self-pay | Attending: Family Medicine

## 2018-12-13 ENCOUNTER — Other Ambulatory Visit: Payer: Self-pay

## 2018-12-13 DIAGNOSIS — I5031 Acute diastolic (congestive) heart failure: Secondary | ICD-10-CM

## 2018-12-13 DIAGNOSIS — E118 Type 2 diabetes mellitus with unspecified complications: Secondary | ICD-10-CM

## 2018-12-14 LAB — BRAIN NATRIURETIC PEPTIDE: BNP: 208 pg/mL — ABNORMAL HIGH (ref 0.0–100.0)

## 2018-12-14 LAB — CMP14+EGFR
ALT: 31 IU/L (ref 0–44)
AST: 26 IU/L (ref 0–40)
Albumin/Globulin Ratio: 1.4 (ref 1.2–2.2)
Albumin: 4.3 g/dL (ref 3.8–4.8)
Alkaline Phosphatase: 201 IU/L — ABNORMAL HIGH (ref 39–117)
BUN/Creatinine Ratio: 21 (ref 10–24)
BUN: 21 mg/dL (ref 8–27)
Bilirubin Total: 1.1 mg/dL (ref 0.0–1.2)
CO2: 23 mmol/L (ref 20–29)
Calcium: 9.5 mg/dL (ref 8.6–10.2)
Chloride: 100 mmol/L (ref 96–106)
Creatinine, Ser: 0.99 mg/dL (ref 0.76–1.27)
GFR calc Af Amer: 95 mL/min/{1.73_m2} (ref >59)
GFR calc non Af Amer: 82 mL/min/{1.73_m2} (ref >59)
Globulin, Total: 3.1 g/dL (ref 1.5–4.5)
Glucose: 105 mg/dL — ABNORMAL HIGH (ref 65–99)
Potassium: 4.5 mmol/L (ref 3.5–5.2)
Sodium: 139 mmol/L (ref 134–144)
Total Protein: 7.4 g/dL (ref 6.0–8.5)

## 2018-12-14 LAB — LIPID PANEL
Chol/HDL Ratio: 1.8 ratio (ref 0.0–5.0)
Cholesterol, Total: 101 mg/dL (ref 100–199)
HDL: 55 mg/dL (ref >39)
LDL Calculated: 36 mg/dL (ref 0–99)
Triglycerides: 48 mg/dL (ref 0–149)
VLDL Cholesterol Cal: 10 mg/dL (ref 5–40)

## 2018-12-17 ENCOUNTER — Telehealth: Payer: Self-pay

## 2018-12-17 NOTE — Telephone Encounter (Signed)
Patient name and DOB has been verified Patient was informed of lab results. Patient had no questions.  

## 2018-12-17 NOTE — Telephone Encounter (Signed)
-----   Message from [name], MD sent at 12/17/2018 12:31 PM EDT ----- Labs are stable

## 2018-12-19 MED FILL — TORSEMIDE 20 MG TABLET: 20 | 30 days supply | Qty: 75 | Fill #0

## 2018-12-21 MED FILL — ?ATORVASTATIN 20 MG TABLET: 20 | 30 days supply | Qty: 30 | Fill #2

## 2018-12-24 ENCOUNTER — Other Ambulatory Visit: Payer: Self-pay | Admitting: Pharmacist

## 2018-12-24 MED ORDER — GLUCOSE BLOOD VI STRP: ORAL_STRIP | 11 refills | Status: DC

## 2018-12-24 MED FILL — ?ATORVASTATIN 20 MG TABLET: 20 | 30 days supply | Qty: 30 | Fill #3

## 2018-12-24 MED FILL — TRUE METRIX TEST STRIP: 25 days supply | Qty: 100 | Fill #0

## 2019-01-02 MED FILL — ?GLIPIZIDE 5MG TABLET: 5 | 30 days supply | Qty: 30 | Fill #2

## 2019-01-07 MED FILL — ?METFORMIN HCL 500MG TABLET: 500 | 30 days supply | Qty: 120 | Fill #1

## 2019-01-08 MED FILL — XARELTO 20 MG TABLET: 20 | 30 days supply | Qty: 30 | Fill #4

## 2019-01-08 MED FILL — hydrALAZINE HCL 100 MG TABS: 100 | 30 days supply | Qty: 90 | Fill #0

## 2019-01-09 MED FILL — ?METOPROLOL 50 MG TABLET: 50 | 30 days supply | Qty: 60 | Fill #3

## 2019-01-14 ENCOUNTER — Ambulatory Visit: Payer: Self-pay | Admitting: Family Medicine

## 2019-01-14 MED FILL — ?AMLODIPINE BESYLATE 5MG TA: 5 | 30 days supply | Qty: 30 | Fill #1

## 2019-01-21 ENCOUNTER — Telehealth: Payer: Self-pay | Admitting: Family Medicine

## 2019-01-21 NOTE — Telephone Encounter (Signed)
Patient called requesting to speak with Paul Mclaughlin regarding his CAFA and OC renewal. Patient was informed of the protocol that the clinic is having to do at this time but still requested to speak with Paul Mclaughlin. Please f/u.

## 2019-01-24 NOTE — Telephone Encounter (Signed)
Pt was informed that he has Medicaid since 12/12/2018 still active today.

## 2019-02-05 MED FILL — ?METOPROLOL 50 MG TABLET: 50 | 30 days supply | Qty: 60 | Fill #4

## 2019-02-05 MED FILL — ?METFORMIN HCL 500MG TABLET: 500 | 30 days supply | Qty: 120 | Fill #2

## 2019-02-12 MED FILL — TORSEMIDE 20 MG TABLET: 20 | 30 days supply | Qty: 75 | Fill #1

## 2019-02-12 MED FILL — ?AMLODIPINE BESYLATE 5MG TA: 5 | 30 days supply | Qty: 30 | Fill #2

## 2019-02-18 MED FILL — XARELTO 20 MG TABLET: 20 | 30 days supply | Qty: 30 | Fill #5

## 2019-02-19 ENCOUNTER — Other Ambulatory Visit: Payer: Self-pay | Admitting: Family Medicine

## 2019-02-19 DIAGNOSIS — E118 Type 2 diabetes mellitus with unspecified complications: Secondary | ICD-10-CM

## 2019-02-19 MED FILL — TRUEplus LANCETS 28G MISC: 30 days supply | Qty: 100 | Fill #0

## 2019-02-21 MED FILL — ?ATORVASTATIN 20 MG TABLET: 20 | 30 days supply | Qty: 30 | Fill #4

## 2019-02-22 MED FILL — ?GLIPIZIDE 5MG TABLET: 5 | 30 days supply | Qty: 30 | Fill #3

## 2019-03-08 MED FILL — ?METOPROLOL 50 MG TABLET: 50 | 30 days supply | Qty: 60 | Fill #5

## 2019-03-08 MED FILL — hydrALAZINE HCL 100 MG TABS: 100 | 30 days supply | Qty: 90 | Fill #1

## 2019-03-13 MED FILL — ?METFORMIN HCL 500MG TABLET: 500 | 30 days supply | Qty: 120 | Fill #3

## 2019-03-18 MED FILL — ?AMLODIPINE BESYLATE 5MG TA: 5 | 30 days supply | Qty: 30 | Fill #0

## 2019-03-21 MED FILL — XARELTO 20 MG TABLET: 20 | 30 days supply | Qty: 30 | Fill #6

## 2019-03-27 MED FILL — ?ATORVASTATIN 20 MG TABLET: 20 | 30 days supply | Qty: 30 | Fill #5

## 2019-04-08 MED FILL — ?METOPROLOL 50 MG TABLET: 50 | 30 days supply | Qty: 60 | Fill #6

## 2019-04-12 MED FILL — hydrALAZINE HCL 100 MG TABS: 100 | 30 days supply | Qty: 90 | Fill #2

## 2019-04-12 MED FILL — ?METFORMIN HCL 500MG TABLET: 500 | 30 days supply | Qty: 120 | Fill #4

## 2019-04-16 MED FILL — ?AMLODIPINE BESYLATE 5MG TA: 5 | 30 days supply | Qty: 30 | Fill #1

## 2019-04-19 MED FILL — XARELTO 20 MG TABLET: 20 | 30 days supply | Qty: 30 | Fill #7

## 2019-04-23 MED FILL — ?ATORVASTATIN 20 MG TABLET: 20 | 30 days supply | Qty: 30 | Fill #6

## 2019-05-08 MED FILL — ?GLIPIZIDE 5MG TABLET: 5 | 30 days supply | Qty: 30 | Fill #4

## 2019-05-08 MED FILL — ?METOPROLOL 50 MG TABLET: 50 | 30 days supply | Qty: 60 | Fill #0

## 2019-05-13 MED FILL — ?METFORMIN HCL 500MG TABLET: 500 | 30 days supply | Qty: 120 | Fill #5

## 2019-05-16 MED FILL — ?AMLODIPINE BESYLATE 5MG TA: 5 | 30 days supply | Qty: 30 | Fill #2

## 2019-05-24 ENCOUNTER — Other Ambulatory Visit: Payer: Self-pay | Admitting: Family Medicine

## 2019-05-24 DIAGNOSIS — E118 Type 2 diabetes mellitus with unspecified complications: Secondary | ICD-10-CM

## 2019-05-24 MED FILL — ATORVASTATIN 20 MG TABLET: 20 | 30 days supply | Qty: 30 | Fill #0

## 2019-05-29 MED FILL — TRUEplus LANCETS 28G MISC: 30 days supply | Qty: 100 | Fill #1

## 2019-05-29 MED FILL — hydrALAZINE HCL 100 MG TABS: 100 | 30 days supply | Qty: 90 | Fill #3

## 2019-05-29 MED FILL — TRUE METRIX TEST STRIP: 25 days supply | Qty: 100 | Fill #1

## 2019-06-03 MED FILL — TORSEMIDE 20 MG TABLET: 20 | 30 days supply | Qty: 75 | Fill #2

## 2019-06-05 MED FILL — XARELTO 20 MG TABLET: 20 | 30 days supply | Qty: 30 | Fill #8

## 2019-06-11 MED FILL — ?METOPROLOL 50 MG TABLET: 50 | 30 days supply | Qty: 60 | Fill #1

## 2019-06-17 MED FILL — ?AMLODIPINE BESYLATE 5MG TA: 5 | 30 days supply | Qty: 30 | Fill #3

## 2019-06-17 MED FILL — ?METFORMIN HCL 500MG TABLET: 500 | 30 days supply | Qty: 120 | Fill #6

## 2019-07-09 MED FILL — ?METOPROLOL 50 MG TABLET: 50 | 30 days supply | Qty: 60 | Fill #2

## 2019-07-15 MED FILL — ?METFORMIN HCL 500MG TABLET: 500 | 30 days supply | Qty: 120 | Fill #0

## 2019-07-15 MED FILL — ?AMLODIPINE BESYLATE 5 MG T: 5 MG | 30 days supply | Qty: 30 | Fill #4

## 2019-07-15 MED FILL — hydrALAZINE HCL 100 MG TABS: 100 | 30 days supply | Qty: 90 | Fill #4

## 2019-07-23 MED FILL — XARELTO 20 MG TABLET: 20 | 30 days supply | Qty: 30 | Fill #9

## 2019-08-12 MED FILL — ?AMLODIPINE BESYLATE 5 MG T: 5 MG | 30 days supply | Qty: 30 | Fill #5

## 2019-08-12 MED FILL — ?METFORMIN HCL 500MG TABLET: 500 | 30 days supply | Qty: 120 | Fill #1

## 2019-08-12 MED FILL — ?METOPROLOL 50 MG TABLET: 50 | 30 days supply | Qty: 60 | Fill #3

## 2019-09-02 MED FILL — TORSEMIDE 20 MG TABLET: 20 | 30 days supply | Qty: 75 | Fill #3

## 2019-09-02 MED FILL — XARELTO 20 MG TABLET: 20 | 30 days supply | Qty: 30 | Fill #10

## 2019-09-02 MED FILL — hydrALAZINE HCL 100 MG TABS: 100 | 30 days supply | Qty: 90 | Fill #5

## 2019-09-10 MED FILL — ?METOPROLOL 50 MG TABLET: 50 | 30 days supply | Qty: 60 | Fill #4

## 2019-09-11 MED FILL — AMLODIPINE BESYLATE 5 MG TA: 5 | 30 days supply | Qty: 30 | Fill #6

## 2019-09-19 MED FILL — ?METFORMIN HCL 500MG TABLET: 500 | 30 days supply | Qty: 120 | Fill #2

## 2019-10-14 ENCOUNTER — Other Ambulatory Visit: Payer: Self-pay | Admitting: Family Medicine

## 2019-10-14 DIAGNOSIS — I10 Essential (primary) hypertension: Secondary | ICD-10-CM

## 2019-10-14 MED FILL — METOPROLOL TARTRATE 50 MG T: 50 | 30 days supply | Qty: 60 | Fill #5

## 2019-10-14 MED FILL — hydrALAZINE HCL 100 MG TABS: 100 | 30 days supply | Qty: 90 | Fill #6

## 2019-10-14 MED FILL — metFORMIN HCL 500 MG TABS: 500 | 30 days supply | Qty: 120 | Fill #3

## 2019-10-14 MED FILL — XARELTO 20 MG TABLET: 20 | 30 days supply | Qty: 30 | Fill #0

## 2019-11-11 MED FILL — METOPROLOL TARTRATE 50 MG T: 50 | 30 days supply | Qty: 60 | Fill #6

## 2019-11-18 MED FILL — metFORMIN HCL 500 MG TABS: 500 | 30 days supply | Qty: 120 | Fill #4

## 2019-11-25 MED FILL — XARELTO 20 MG TABLET: 20 | 30 days supply | Qty: 30 | Fill #1

## 2019-12-10 ENCOUNTER — Other Ambulatory Visit: Payer: Self-pay | Admitting: Family Medicine

## 2019-12-10 DIAGNOSIS — I10 Essential (primary) hypertension: Secondary | ICD-10-CM

## 2019-12-12 ENCOUNTER — Other Ambulatory Visit: Payer: Self-pay | Admitting: Family Medicine

## 2019-12-12 DIAGNOSIS — I10 Essential (primary) hypertension: Secondary | ICD-10-CM

## 2019-12-16 ENCOUNTER — Other Ambulatory Visit: Payer: Self-pay | Admitting: Family Medicine

## 2019-12-16 DIAGNOSIS — E118 Type 2 diabetes mellitus with unspecified complications: Secondary | ICD-10-CM

## 2019-12-24 ENCOUNTER — Other Ambulatory Visit: Payer: Self-pay | Admitting: Family Medicine

## 2019-12-24 DIAGNOSIS — I480 Paroxysmal atrial fibrillation: Secondary | ICD-10-CM

## 2019-12-25 MED FILL — XARELTO 20 MG TABLET: 20 | 30 days supply | Qty: 30 | Fill #0

## 2019-12-31 ENCOUNTER — Encounter: Payer: Self-pay | Admitting: Family Medicine

## 2019-12-31 ENCOUNTER — Ambulatory Visit: Payer: Medicaid Other | Attending: Family Medicine | Admitting: Family Medicine

## 2019-12-31 ENCOUNTER — Other Ambulatory Visit: Payer: Self-pay

## 2019-12-31 ENCOUNTER — Other Ambulatory Visit: Payer: Self-pay | Admitting: Family Medicine

## 2019-12-31 VITALS — BP 194/90 | HR 63 | Ht 73.0 in | Wt 266.0 lb

## 2019-12-31 DIAGNOSIS — Z79899 Other long term (current) drug therapy: Secondary | ICD-10-CM | POA: Diagnosis not present

## 2019-12-31 DIAGNOSIS — Z89429 Acquired absence of other toe(s), unspecified side: Secondary | ICD-10-CM | POA: Insufficient documentation

## 2019-12-31 DIAGNOSIS — E1151 Type 2 diabetes mellitus with diabetic peripheral angiopathy without gangrene: Secondary | ICD-10-CM | POA: Diagnosis not present

## 2019-12-31 DIAGNOSIS — I509 Heart failure, unspecified: Secondary | ICD-10-CM | POA: Diagnosis not present

## 2019-12-31 DIAGNOSIS — E1142 Type 2 diabetes mellitus with diabetic polyneuropathy: Secondary | ICD-10-CM | POA: Diagnosis not present

## 2019-12-31 DIAGNOSIS — I10 Essential (primary) hypertension: Secondary | ICD-10-CM

## 2019-12-31 DIAGNOSIS — Z7901 Long term (current) use of anticoagulants: Secondary | ICD-10-CM | POA: Diagnosis not present

## 2019-12-31 DIAGNOSIS — I739 Peripheral vascular disease, unspecified: Secondary | ICD-10-CM | POA: Diagnosis not present

## 2019-12-31 DIAGNOSIS — Z1159 Encounter for screening for other viral diseases: Secondary | ICD-10-CM

## 2019-12-31 DIAGNOSIS — I110 Hypertensive heart disease with heart failure: Secondary | ICD-10-CM

## 2019-12-31 DIAGNOSIS — I480 Paroxysmal atrial fibrillation: Secondary | ICD-10-CM | POA: Diagnosis not present

## 2019-12-31 DIAGNOSIS — Z833 Family history of diabetes mellitus: Secondary | ICD-10-CM | POA: Diagnosis not present

## 2019-12-31 DIAGNOSIS — E118 Type 2 diabetes mellitus with unspecified complications: Secondary | ICD-10-CM | POA: Diagnosis not present

## 2019-12-31 DIAGNOSIS — Z7984 Long term (current) use of oral hypoglycemic drugs: Secondary | ICD-10-CM | POA: Insufficient documentation

## 2019-12-31 DIAGNOSIS — I5042 Chronic combined systolic (congestive) and diastolic (congestive) heart failure: Secondary | ICD-10-CM

## 2019-12-31 LAB — GLUCOSE, POCT (MANUAL RESULT ENTRY): POC Glucose: 111 mg/dl — AB (ref 70–99)

## 2019-12-31 MED ORDER — METOPROLOL TARTRATE 50 MG PO TABS: 50.0000 mg | ORAL_TABLET | Freq: Two times a day (BID) | ORAL | 6 refills | Status: DC

## 2019-12-31 MED ORDER — HYDRALAZINE HCL 100 MG PO TABS: 100.0000 mg | ORAL_TABLET | Freq: Three times a day (TID) | ORAL | 6 refills | Status: DC

## 2019-12-31 MED ORDER — TORSEMIDE 20 MG PO TABS: ORAL_TABLET | ORAL | 6 refills | Status: DC

## 2019-12-31 MED ORDER — AMLODIPINE BESYLATE 5 MG PO TABS: 5.0000 mg | ORAL_TABLET | Freq: Every day | ORAL | 6 refills | Status: DC

## 2019-12-31 MED ORDER — RIVAROXABAN 20 MG PO TABS: ORAL_TABLET | ORAL | 6 refills | Status: DC

## 2019-12-31 MED ORDER — ATORVASTATIN CALCIUM 20 MG PO TABS: 20.0000 mg | ORAL_TABLET | Freq: Every day | ORAL | 6 refills | Status: DC

## 2019-12-31 MED ORDER — MISC. DEVICES MISC: 0 refills | Status: AC

## 2019-12-31 MED ORDER — METFORMIN HCL 500 MG PO TABS: 1000.0000 mg | ORAL_TABLET | Freq: Two times a day (BID) | ORAL | 6 refills | Status: DC

## 2019-12-31 MED FILL — METOPROLOL TARTRATE 50 MG T: 50 | 30 days supply | Qty: 60 | Fill #0

## 2019-12-31 MED FILL — ATORVASTATIN CALCIUM 20 MG: 20 | 30 days supply | Qty: 30 | Fill #0

## 2019-12-31 MED FILL — hydrALAZINE HCL 100 MG TABS: 100 | 30 days supply | Qty: 90 | Fill #0

## 2019-12-31 MED FILL — TORSEMIDE 20 MG TABLET: 20 | 30 days supply | Qty: 75 | Fill #0

## 2019-12-31 MED FILL — metFORMIN HCL 500 MG TABS: 500 | 30 days supply | Qty: 120 | Fill #0

## 2019-12-31 MED FILL — AMLODIPINE BESYLATE 5 MG TA: 5 | 30 days supply | Qty: 30 | Fill #0

## 2019-12-31 NOTE — Progress Notes (Signed)
Established Patient Office Visit  Subjective:  Patient ID: Paul Mclaughlin, male    DOB: 08/31/1956  Age: 64 y.o. MRN: [id number]  CC:  Chief Complaint  Patient presents with  . Diabetes    HPI Paul Mclaughlin is a 64 yo male with a medical history of Diabetes Mellitus Type 2, Hypertension, Paroxysmal Atrial Fibrillation, Peripheral Arterial Disease and Congestive Heart Failure (with a 55-60% Ejection Fraction) that comes into the clinic today for a follow up visit.   Paul Mclaughlin has not had care since last year (01/2019) and needs refills on his medications. He reports he ran out his last BP medication over a week ago. On office visit today his BP was 194/90. He denies any headaches, or vision changes.   Paul Mclaughlin is currently prescribed Metoprolol for rate control of his Atrial Fibrillation and Xarelto for anticoagulation but has ran out of his medications. He denies any chest pain, or palpitations.  In regards to his Congestive Heart Failure, Paul Mclaughlin has been compliant with his torsemide. He also denies any dyspnea at rest, and says that his dyspnea on exertion has improved. He denies any cough or orthopnea or peripheral edema.  In office today, CBG was 111 mg/dl and Paul Mclaughlin reports that he has been checking his blood sugars at home and they range from 95-180. He does not report any low sugars or hypoglycemic episodes in over a year. He does not endorse any adverse effects with his current medications. He does report vision change 3 months ago, stating he saw a red dot in the center of his vision in his right eye but it disappeared and has not noticed it since. He reports that he has not had an eye exam in 2-3 years, but will return to [facility] for an exam. He does not report any other vision changes.  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 He reports that his chronic ulcer on his right foot has  completely healed as he was not working due and did not leave the house as much over the past year due to COVID-19 pandemic.He has not taking Gabapentin in over a year, but  he does not report any pain, or tingling in his feet.   He does not have any additional medical concerns for today's visit.  Past Medical History:  Diagnosis Date  . Atrial fibrillation ([place])   . Cellulitis and abscess of left leg 06/2016  . CHF (congestive heart failure) ([place])   . Diabetes ([place])   . Dyspnea     Past Surgical History:  Procedure Laterality Date  . AMPUTATION Left 06/24/2016   Procedure: FOOT FIFTH RAY TOE AMPUTATION;  Surgeon: Newt Minion, MD;  Location: [facility];  Service: Orthopedics;  Laterality: Left;  . open heart surgery     As a child.  70 years old.  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   [name] RIGHT/LEFT HEART CATH AND CORONARY ANGIOGRAPHY N/A 03/30/2017   Procedure: Right/Left Heart Cath and Coronary Angiography;  Surgeon: Larey Dresser, MD;  Location: [facility];  Service: Cardiovascular;  Laterality: N/A;    Family History  Problem Relation Age of Onset  . Diabetes Mellitus II Mother        ESRD  . Diabetes Mellitus II Brother   . Emphysema Father     Social History   Socioeconomic History  . Marital status: Divorced  Spouse name: Not on file  . Number of children: 1  . Years of education: Not on file  . Highest education level: Not on file  Occupational History  . Not on file  Tobacco Use  . Smoking status: Never Smoker  . Smokeless tobacco: Never Used  Substance and Sexual Activity  . Alcohol use: No    Comment: gave up drinking ~20 years ago  . Drug use: No    Types: Marijuana    Comment: not since the 1st of January  . Sexual activity: Not on file  Other Topics Concern  . Not on file  Social History Narrative   Lives alone    Social Determinants of Health   Financial Resource Strain:   . Difficulty of Paying Living Expenses:   Food Insecurity:   . Worried About  Charity fundraiser in the Last Year:   . Arboriculturist in the Last Year:   Transportation Needs:   . Film/video editor (Medical):   [name] Lack of Transportation (Non-Medical):   Physical Activity:   . Days of Exercise per Week:   . Minutes of Exercise per Session:   Stress:   . Feeling of Stress :   Social Connections:   . Frequency of Communication with Friends and Family:   . Frequency of Social Gatherings with Friends and Family:   . Attends Religious Services:   . Active Member of Clubs or Organizations:   . Attends Archivist Meetings:   [name] Marital Status:   Intimate Partner Violence:   . Fear of Current or Ex-Partner:   . Emotionally Abused:   [name] Physically Abused:   . Sexually Abused:     Outpatient Medications Prior to Visit  Medication Sig Dispense Refill  . albuterol (PROVENTIL HFA;VENTOLIN HFA) 108 (90 Base) MCG/ACT inhaler Inhale 2 puffs into the lungs every 6 (six) hours as needed for wheezing or shortness of breath. 1 Inhaler 0  . cetirizine (ZYRTEC) 10 MG tablet Take 1 tablet (10 mg total) by mouth daily. 30 tablet 1  . glucose blood test strip Use as instructed 100 each 11  . TRUEplus Lancets 28G MISC USE AS DIRECTED 100 each 12  . hydrALAZINE (APRESOLINE) 100 MG tablet Take 1 tablet (100 mg total) by mouth 3 (three) times daily. 90 tablet 6  . metFORMIN (GLUCOPHAGE) 500 MG tablet Take 2 tablets (1,000 mg total) by mouth 2 (two) times daily with a meal. 120 tablet 6  . metoprolol tartrate (LOPRESSOR) 50 MG tablet Take 1 tablet (50 mg total) by mouth 2 (two) times daily. 60 tablet 6  . torsemide (DEMADEX) 20 MG tablet Alternate 60 mg (3 tabs), and 40 mg (2 tabs) daily 75 tablet 6  . XARELTO 20 MG TABS tablet TAKE 1 TABLET BY MOUTH DAILY WITH SUPPER. 30 tablet 0  . gabapentin (NEURONTIN) 300 MG capsule TAKE 1 CAPSULE BY MOUTH 2 TIMES DAILY. (Patient not taking: Reported on 12/31/2019) 60 capsule 3  . amLODipine (NORVASC) 5 MG tablet Take 1 tablet (5 mg  total) by mouth daily. 30 tablet 6  . atorvastatin (LIPITOR) 20 MG tablet Take 1 tablet (20 mg total) by mouth daily. Must have office visit for refills (Patient not taking: Reported on 12/31/2019) 30 tablet 0  . cephALEXin (KEFLEX) 500 MG capsule Take 1 capsule (500 mg total) by mouth 3 (three) times daily. (Patient not taking: Reported on 02/28/2018) 21 capsule 0  . glipiZIDE (GLUCOTROL) 5 MG tablet  Take 1 tablet (5 mg total) by mouth daily before breakfast. (Patient not taking: Reported on 12/31/2019) 30 tablet 6   No facility-administered medications prior to visit.    No Known Allergies  ROS Review of Systems  Constitutional: Negative for activity change, appetite change, fatigue and unexpected weight change.  HENT: Negative.   Respiratory: Negative for cough, chest tightness and shortness of breath.   Cardiovascular: Negative for chest pain, palpitations and leg swelling.  Gastrointestinal: Negative for constipation, nausea and vomiting.  Musculoskeletal: Negative for arthralgias and joint swelling.  Skin:       Dry skin on legs bilaterally. No ulcers.   Neurological: Negative for weakness, light-headedness, numbness and headaches.  Psychiatric/Behavioral: Negative for decreased concentration and dysphoric mood.      Objective:    Physical Exam  Constitutional: He is oriented to person, place, and time. He appears well-developed and well-nourished.  HENT:  Head: Normocephalic and atraumatic.  Mouth/Throat: Oropharynx is clear and moist.  Eyes: Pupils are equal, round, and reactive to light. Conjunctivae are normal.  Neck: No JVD present.  Cardiovascular: Normal rate, regular rhythm and normal heart sounds.  Pulses:      Radial pulses are 2+ on the right side and 2+ on the left side.       Dorsalis pedis pulses are 1+ on the right side and 2+ on the left side.  Abdominal: Soft. Bowel sounds are normal.  Musculoskeletal:        General: Normal range of motion.     Cervical  back: Normal range of motion.  Neurological: He is alert and oriented to person, place, and time.  Skin: Skin is warm and dry.  Dry skin on lower legs bilaterally. No ulcers  Psychiatric: He has a normal mood and affect. His behavior is normal.    BP (!) 194/90   Pulse 63   Ht 6' 1"  (1.854 m)   Wt 266 lb (120.7 kg)   SpO2 97%   BMI 35.09 kg/m  Wt Readings from Last 3 Encounters:  12/31/19 266 lb (120.7 kg)  09/10/18 270 lb 9.6 oz (122.7 kg)  08/14/18 271 lb (122.9 kg)     Health Maintenance Due  Topic Date Due  . Hepatitis C Screening  Never done  . FOOT EXAM  Never done  . OPHTHALMOLOGY EXAM  Never done  . HIV Screening  Never done  . COVID-19 Vaccine (1) Never done  . TETANUS/TDAP  Never done  . COLONOSCOPY  Never done  . HEMOGLOBIN A1C  03/12/2019    There are no preventive care reminders to display for this patient.  Lab Results  Component Value Date   TSH 1.275 06/25/2016   Lab Results  Component Value Date   WBC 9.0 12/08/2017   HGB 11.1 (L) 12/08/2017   HCT 34.6 (L) 12/08/2017   MCV 98.3 12/08/2017   PLT 211 12/08/2017   Lab Results  Component Value Date   NA 139 12/13/2018   K 4.5 12/13/2018   CO2 23 12/13/2018   GLUCOSE 105 (H) 12/13/2018   BUN 21 12/13/2018   CREATININE 0.99 12/13/2018   BILITOT 1.1 12/13/2018   ALKPHOS 201 (H) 12/13/2018   AST 26 12/13/2018   ALT 31 12/13/2018   PROT 7.4 12/13/2018   ALBUMIN 4.3 12/13/2018   CALCIUM 9.5 12/13/2018   ANIONGAP 9 12/22/2017   Lab Results  Component Value Date   CHOL 101 12/13/2018   Lab Results  Component Value Date  HDL 55 12/13/2018   Lab Results  Component Value Date   LDLCALC 36 12/13/2018   Lab Results  Component Value Date   TRIG 48 12/13/2018   Lab Results  Component Value Date   CHOLHDL 1.8 12/13/2018   Lab Results  Component Value Date   HGBA1C 5.5 09/10/2018      Assessment & Plan:   Problem List Items Addressed This Visit      Cardiovascular and  Mediastinum   Atrial fibrillation ([place])  Stable. Patient does not report any palpitations, SOB or fatigue.  Cardiac Ausculation RRR Patient needs refills on medications. Continue on Metoprolol for rate control and Rivaroxaban (Xarelto) for anticoagulation    Relevant Medications   amLODipine (NORVASC) 5 MG tablet   atorvastatin (LIPITOR) 20 MG tablet   hydrALAZINE (APRESOLINE) 100 MG tablet   metoprolol tartrate (LOPRESSOR) 50 MG tablet   torsemide (DEMADEX) 20 MG tablet   rivaroxaban (XARELTO) 20 MG TABS tablet   Essential hypertension  Uncontrolled. BP is elevated on today's visit 194/90, BP at last visit 135/68 (09/10/2018) He reports that he is currently out of BP medications. We will refill medications today. Patient requested at home BP monitor and we will send in request. Patient should continue taking current medications and check BP at home once monitor is received and log BP We will review at BP logs and reassess at next visit.   Relevant Medications   amLODipine (NORVASC) 5 MG tablet   atorvastatin (LIPITOR) 20 MG tablet   hydrALAZINE (APRESOLINE) 100 MG tablet   metoprolol tartrate (LOPRESSOR) 50 MG tablet   torsemide (DEMADEX) 20 MG tablet   rivaroxaban (XARELTO) 20 MG TABS tablet   Hypertensive heart disease Stable. EF 55-60% on Echocardiogram (11/2016). Volume status is euvolemic.  Patient should continue on current medical management.   Relevant Medications   amLODipine (NORVASC) 5 MG tablet   atorvastatin (LIPITOR) 20 MG tablet   hydrALAZINE (APRESOLINE) 100 MG tablet   metoprolol tartrate (LOPRESSOR) 50 MG tablet   torsemide (DEMADEX) 20 MG tablet   rivaroxaban (XARELTO) 20 MG TABS tablet   Peripheral arterial disease ([facility])  Asymtomatic. No claudication reported.    Relevant Medications   amLODipine (NORVASC) 5 MG tablet   atorvastatin (LIPITOR) 20 MG tablet   hydrALAZINE (APRESOLINE) 100 MG tablet   metoprolol tartrate (LOPRESSOR) 50 MG tablet    torsemide (DEMADEX) 20 MG tablet   rivaroxaban (XARELTO) 20 MG TABS tablet    Other Visit Diagnoses    Type 2 diabetes mellitus with complication, without long-term current use of insulin ([place])    -  Primary  Controlled. Fasting CBG at today's visit was 111. A1c send out pending.  We will refill medications. Patient should continue taking Metformin and Atorvastatin, Continue to check and log blood sugars at home. Patient should also continue diabetic management with diet and exercise. We discussed not waiting too long to eat in the day. Discussed importance of Annual Eye Exams and Foot Exams. We will check CMP, Microalbumin and Lipid Panel at today's visit.   Relevant Medications   atorvastatin (LIPITOR) 20 MG tablet   metFORMIN (GLUCOPHAGE) 500 MG tablet   Other Relevant Orders   POCT glucose (manual entry) (Completed)   Hemoglobin A1c   Microalbumin / creatinine urine ratio   CMP14+EGFR   Lipid panel   Screening for viral disease     Patient is due for viral disease screening. We will check Hepatitis C and HIV antibody   Relevant Orders  HCV RNA quant rflx ultra or genotyp([facility])   HIV Antibody (routine testing w rflx)     Health Maintenance:  Patient declined Colonoscopy referral at this time. He will think about it and we will discuss CRC screening options at the next visit.  Meds ordered this encounter  Medications  . amLODipine (NORVASC) 5 MG tablet    Sig: Take 1 tablet (5 mg total) by mouth daily.    Dispense:  30 tablet    Refill:  6  . atorvastatin (LIPITOR) 20 MG tablet    Sig: Take 1 tablet (20 mg total) by mouth daily.    Dispense:  30 tablet    Refill:  6  . hydrALAZINE (APRESOLINE) 100 MG tablet    Sig: Take 1 tablet (100 mg total) by mouth 3 (three) times daily.    Dispense:  90 tablet    Refill:  6  . metFORMIN (GLUCOPHAGE) 500 MG tablet    Sig: Take 2 tablets (1,000 mg total) by mouth 2 (two) times daily with a meal.    Dispense:  120  tablet    Refill:  6  . metoprolol tartrate (LOPRESSOR) 50 MG tablet    Sig: Take 1 tablet (50 mg total) by mouth 2 (two) times daily.    Dispense:  60 tablet    Refill:  6  . torsemide (DEMADEX) 20 MG tablet    Sig: Alternate 60 mg (3 tabs), and 40 mg (2 tabs) daily    Dispense:  75 tablet    Refill:  6  . rivaroxaban (XARELTO) 20 MG TABS tablet    Sig: TAKE 1 TABLET BY MOUTH DAILY WITH SUPPER.    Dispense:  30 tablet    Refill:  6  . Misc. Devices MISC    Sig: Blood pressure monitor.  Diagnosis- hypertension    Dispense:  1 each    Refill:  0    Follow-up: Return in about 3 months (around 03/31/2020) for chronic disease management.    Theodis Sato, Medical Student   Evaluation and management procedures were performed by me with Medical Student in attendance, note written by Medical Student under my supervision and collaboration. I have reviewed the note and I agree with the management and plan.  Mr. Sherrin presents today with hypertensive urgency due to running out of his antihypertensives but is asymptomatic; unable to administer clonidine due to low heart rate.  I have refilled his medications and he has been encouraged to call the clinic whenever he runs low to avoid running out.  His diabetes is well controlled, Afib is stable and he is in sinus rhythm.  He is asymptomatic with regards to his peripheral arterial disease.  We will continue risk factor modification. With regards to healthcare maintenance he is undecided at this time and we will revisit this at his next office visit.  Charlott Rakes, MD, FAAFP. [facility] and [facility] [place], [place]   12/31/2019, 5:21 PM

## 2019-12-31 NOTE — Progress Notes (Signed)
No BP medication in 1 week.

## 2019-12-31 NOTE — Patient Instructions (Signed)

## 2020-01-01 LAB — LIPID PANEL
Chol/HDL Ratio: 3.6 ratio (ref 0.0–5.0)
Cholesterol, Total: 120 mg/dL (ref 100–199)
HDL: 33 mg/dL — ABNORMAL LOW (ref >39)
LDL Chol Calc (NIH): 74 mg/dL (ref 0–99)
Triglycerides: 61 mg/dL (ref 0–149)
VLDL Cholesterol Cal: 13 mg/dL (ref 5–40)

## 2020-01-01 LAB — CMP14+EGFR
ALT: 6 IU/L (ref 0–44)
AST: 15 IU/L (ref 0–40)
Albumin/Globulin Ratio: 1.5 (ref 1.2–2.2)
Albumin: 4.5 g/dL (ref 3.8–4.8)
Alkaline Phosphatase: 188 IU/L — ABNORMAL HIGH (ref 39–117)
BUN/Creatinine Ratio: 14 (ref 10–24)
BUN: 15 mg/dL (ref 8–27)
Bilirubin Total: 1.6 mg/dL — ABNORMAL HIGH (ref 0.0–1.2)
CO2: 27 mmol/L (ref 20–29)
Calcium: 9.5 mg/dL (ref 8.6–10.2)
Chloride: 102 mmol/L (ref 96–106)
Creatinine, Ser: 1.07 mg/dL (ref 0.76–1.27)
GFR calc Af Amer: 86 mL/min/{1.73_m2} (ref >59)
GFR calc non Af Amer: 74 mL/min/{1.73_m2} (ref >59)
Globulin, Total: 3.1 g/dL (ref 1.5–4.5)
Glucose: 113 mg/dL — ABNORMAL HIGH (ref 65–99)
Potassium: 4.4 mmol/L (ref 3.5–5.2)
Sodium: 143 mmol/L (ref 134–144)
Total Protein: 7.6 g/dL (ref 6.0–8.5)

## 2020-01-01 LAB — HCV RNA QUANT RFLX ULTRA OR GENOTYP: HCV Quant Baseline: NOT DETECTED IU/mL

## 2020-01-01 LAB — HEMOGLOBIN A1C
Est. average glucose Bld gHb Est-mCnc: 126 mg/dL
Hgb A1c MFr Bld: 6 % — ABNORMAL HIGH (ref 4.8–5.6)

## 2020-01-01 LAB — HIV ANTIBODY (ROUTINE TESTING W REFLEX): HIV Screen 4th Generation wRfx: NONREACTIVE

## 2020-01-02 ENCOUNTER — Ambulatory Visit: Payer: Medicaid Other | Attending: Internal Medicine

## 2020-01-02 ENCOUNTER — Telehealth: Payer: Self-pay

## 2020-01-02 DIAGNOSIS — Z23 Encounter for immunization: Secondary | ICD-10-CM

## 2020-01-02 NOTE — Telephone Encounter (Signed)
-----   Message from Enobong Newlin, MD sent at 01/02/2020  1:11 PM EDT ----- Please inform the patient that labs are normal. Thank you. 

## 2020-01-02 NOTE — Telephone Encounter (Signed)
Patient name and DOB has been verified Patient was informed of lab results. Patient had no questions.  

## 2020-01-02 NOTE — Progress Notes (Signed)
   Covid-19 Vaccination Clinic  Name:  Paul Mclaughlin    MRN: [id number] DOB: [birth year]  01/02/2020  [name] was observed post Covid-19 immunization for 15 minutes without incident. He was provided with Vaccine Information Sheet and instruction to access the V-Safe system.   [name] was instructed to call 911 with any severe reactions post vaccine: [name] Difficulty breathing  . Swelling of face and throat  . A fast heartbeat  . A bad rash all over body  . Dizziness and weakness   Immunizations Administered    Name Date Dose VIS Date Route   Pfizer COVID-19 Vaccine 01/02/2020  2:09 PM 0.3 mL 11/06/2018 Intramuscular   Manufacturer: [employer], [employer]   Lot: [id number]   NDC: [address]-6

## 2020-01-03 DIAGNOSIS — I10 Essential (primary) hypertension: Secondary | ICD-10-CM | POA: Diagnosis not present

## 2020-01-27 ENCOUNTER — Ambulatory Visit: Payer: Medicaid Other | Attending: Internal Medicine

## 2020-01-27 DIAGNOSIS — Z23 Encounter for immunization: Secondary | ICD-10-CM

## 2020-01-27 MED FILL — METFORMIN HCL 500 MG TABS: 500 | 30 days supply | Qty: 120 | Fill #1

## 2020-01-27 MED FILL — AMLODIPINE BESYLATE 5 MG TA: 5 | 30 days supply | Qty: 30 | Fill #1

## 2020-01-27 MED FILL — XARELTO 20 MG TABLET: 20 | 90 days supply | Qty: 90 | Fill #0

## 2020-01-27 MED FILL — hydrALAZINE HCL 100 MG TABS: 100 | 90 days supply | Qty: 270 | Fill #1

## 2020-01-27 MED FILL — METOPROLOL TARTRATE 50 MG T: 50 | 30 days supply | Qty: 60 | Fill #1

## 2020-01-27 MED FILL — ATORVASTATIN CALCIUM 20 MG: 20 | 90 days supply | Qty: 90 | Fill #1

## 2020-01-27 MED FILL — TORSEMIDE 20 MG TABLET: 20 | 30 days supply | Qty: 75 | Fill #1

## 2020-01-27 NOTE — Progress Notes (Signed)
   Covid-19 Vaccination Clinic  Name:  WINFREY CHILLEMI    MRN: [id number] DOB: Aug 19, 1956  01/27/2020  Mr. Larios was observed post Covid-19 immunization for 15 minutes without incident. He was provided with Vaccine Information Sheet and instruction to access the V-Safe system.   Mr. Serano was instructed to call 911 with any severe reactions post vaccine: [name] Difficulty breathing  . Swelling of face and throat  . A fast heartbeat  . A bad rash all over body  . Dizziness and weakness   Immunizations Administered    Name Date Dose VIS Date Route   Pfizer COVID-19 Vaccine 01/27/2020  1:29 PM 0.3 mL 11/06/2018 Intramuscular   Manufacturer: [employer], [employer]   Lot: [id number]   NDC: [address]-7

## 2020-02-25 MED FILL — METFORMIN HCL 500 MG TABS: 500 | 30 days supply | Qty: 120 | Fill #2

## 2020-02-25 MED FILL — AMLODIPINE BESYLATE 5 MG TA: 5 | 30 days supply | Qty: 30 | Fill #2

## 2020-02-26 MED FILL — METOPROLOL TARTRATE 50 MG T: 50 | 30 days supply | Qty: 60 | Fill #2

## 2020-03-27 MED FILL — METFORMIN HCL 500 MG TABS: 500 | 30 days supply | Qty: 120 | Fill #3

## 2020-03-27 MED FILL — AMLODIPINE BESYLATE 5 MG TA: 5 | 30 days supply | Qty: 30 | Fill #3

## 2020-03-27 MED FILL — METOPROLOL TARTRATE 50 MG T: 50 | 30 days supply | Qty: 60 | Fill #3

## 2020-04-01 ENCOUNTER — Ambulatory Visit: Payer: Medicaid Other | Admitting: Family Medicine

## 2020-04-27 MED FILL — METOPROLOL TARTRATE 50 MG T: 50 | 30 days supply | Qty: 60 | Fill #4

## 2020-04-27 MED FILL — ATORVASTATIN CALCIUM 20 MG: 20 | 90 days supply | Qty: 90 | Fill #2

## 2020-04-27 MED FILL — XARELTO 20 MG TABLET: 20 | 90 days supply | Qty: 90 | Fill #1

## 2020-04-27 MED FILL — AMLODIPINE BESYLATE 5 MG TA: 5 | 30 days supply | Qty: 30 | Fill #4

## 2020-04-29 MED FILL — METFORMIN HCL 500 MG TABS: 500 | 30 days supply | Qty: 120 | Fill #4

## 2020-05-13 ENCOUNTER — Other Ambulatory Visit: Payer: Self-pay | Admitting: Family Medicine

## 2020-05-13 ENCOUNTER — Ambulatory Visit: Payer: Medicaid Other | Attending: Family Medicine | Admitting: Family Medicine

## 2020-05-13 ENCOUNTER — Encounter: Payer: Self-pay | Admitting: Family Medicine

## 2020-05-13 ENCOUNTER — Other Ambulatory Visit: Payer: Self-pay

## 2020-05-13 ENCOUNTER — Ambulatory Visit: Payer: Medicaid Other | Admitting: Family Medicine

## 2020-05-13 VITALS — BP 128/69 | HR 55 | Ht 73.0 in | Wt 255.0 lb

## 2020-05-13 DIAGNOSIS — I110 Hypertensive heart disease with heart failure: Secondary | ICD-10-CM | POA: Diagnosis not present

## 2020-05-13 DIAGNOSIS — I480 Paroxysmal atrial fibrillation: Secondary | ICD-10-CM | POA: Diagnosis not present

## 2020-05-13 DIAGNOSIS — Z1159 Encounter for screening for other viral diseases: Secondary | ICD-10-CM | POA: Diagnosis not present

## 2020-05-13 DIAGNOSIS — I5042 Chronic combined systolic (congestive) and diastolic (congestive) heart failure: Secondary | ICD-10-CM | POA: Diagnosis not present

## 2020-05-13 DIAGNOSIS — I10 Essential (primary) hypertension: Secondary | ICD-10-CM

## 2020-05-13 DIAGNOSIS — E118 Type 2 diabetes mellitus with unspecified complications: Secondary | ICD-10-CM

## 2020-05-13 LAB — POCT GLYCOSYLATED HEMOGLOBIN (HGB A1C): HbA1c, POC (controlled diabetic range): 5.5 % (ref 0.0–7.0)

## 2020-05-13 LAB — GLUCOSE, POCT (MANUAL RESULT ENTRY): POC Glucose: 116 mg/dl — AB (ref 70–99)

## 2020-05-13 MED ORDER — RIVAROXABAN 20 MG PO TABS: ORAL_TABLET | ORAL | 1 refills | Status: DC

## 2020-05-13 MED ORDER — METFORMIN HCL 500 MG PO TABS: 500.0000 mg | ORAL_TABLET | Freq: Two times a day (BID) | ORAL | 1 refills | Status: DC

## 2020-05-13 MED ORDER — METOPROLOL TARTRATE 50 MG PO TABS: 50.0000 mg | ORAL_TABLET | Freq: Two times a day (BID) | ORAL | 1 refills | Status: DC

## 2020-05-13 MED ORDER — TORSEMIDE 20 MG PO TABS: 40.0000 mg | ORAL_TABLET | Freq: Two times a day (BID) | ORAL | 3 refills | Status: DC

## 2020-05-13 MED FILL — TORSEMIDE 20 MG TABLET: 20 | 30 days supply | Qty: 120 | Fill #0

## 2020-05-13 NOTE — Patient Instructions (Signed)
Diabetes Mellitus and Foot Care Foot care is an important part of your health, especially when you have diabetes. Diabetes may cause you to have problems because of poor blood flow (circulation) to your feet and legs, which can cause your skin to:  Become thinner and drier.  Break more easily.  Heal more slowly.  Peel and crack. You may also have nerve damage (neuropathy) in your legs and feet, causing decreased feeling in them. This means that you may not notice minor injuries to your feet that could lead to more serious problems. Noticing and addressing any potential problems early is the best way to prevent future foot problems. How to care for your feet Foot hygiene  Wash your feet daily with warm water and mild soap. Do not use hot water. Then, pat your feet and the areas between your toes until they are completely dry. Do not soak your feet as this can dry your skin.  Trim your toenails straight across. Do not dig under them or around the cuticle. File the edges of your nails with an emery board or nail file.  Apply a moisturizing lotion or petroleum jelly to the skin on your feet and to dry, brittle toenails. Use lotion that does not contain alcohol and is unscented. Do not apply lotion between your toes. Shoes and socks  Wear clean socks or stockings every day. Make sure they are not too tight. Do not wear knee-high stockings since they may decrease blood flow to your legs.  Wear shoes that fit properly and have enough cushioning. Always look in your shoes before you put them on to be sure there are no objects inside.  To break in new shoes, wear them for just a few hours a day. This prevents injuries on your feet. Wounds, scrapes, corns, and calluses  Check your feet daily for blisters, cuts, bruises, sores, and redness. If you cannot see the bottom of your feet, use a mirror or ask someone for help.  Do not cut corns or calluses or try to remove them with medicine.  If you  find a minor scrape, cut, or break in the skin on your feet, keep it and the skin around it clean and dry. You may clean these areas with mild soap and water. Do not clean the area with peroxide, alcohol, or iodine.  If you have a wound, scrape, corn, or callus on your foot, look at it several times a day to make sure it is healing and not infected. Check for: ? Redness, swelling, or pain. ? Fluid or blood. ? Warmth. ? Pus or a bad smell. General instructions  Do not cross your legs. This may decrease blood flow to your feet.  Do not use heating pads or hot water bottles on your feet. They may burn your skin. If you have lost feeling in your feet or legs, you may not know this is happening until it is too late.  Protect your feet from hot and cold by wearing shoes, such as at the beach or on hot pavement.  Schedule a complete foot exam at least once a year (annually) or more often if you have foot problems. If you have foot problems, report any cuts, sores, or bruises to your health care provider immediately. Contact a health care provider if:  You have a medical condition that increases your risk of infection and you have any cuts, sores, or bruises on your feet.  You have an injury that is not   healing.  You have redness on your legs or feet.  You feel burning or tingling in your legs or feet.  You have pain or cramps in your legs and feet.  Your legs or feet are numb.  Your feet always feel cold.  You have pain around a toenail. Get help right away if:  You have a wound, scrape, corn, or callus on your foot and: ? You have pain, swelling, or redness that gets worse. ? You have fluid or blood coming from the wound, scrape, corn, or callus. ? Your wound, scrape, corn, or callus feels warm to the touch. ? You have pus or a bad smell coming from the wound, scrape, corn, or callus. ? You have a fever. ? You have a red line going up your leg. Summary  Check your feet every day  for cuts, sores, red spots, swelling, and blisters.  Moisturize feet and legs daily.  Wear shoes that fit properly and have enough cushioning.  If you have foot problems, report any cuts, sores, or bruises to your health care provider immediately.  Schedule a complete foot exam at least once a year (annually) or more often if you have foot problems. This information is not intended to replace advice given to you by your health care provider. Make sure you discuss any questions you have with your health care provider. Document Revised: 05/22/2019 Document Reviewed: 09/30/2016 Elsevier Patient Education  2020 [employer]  

## 2020-05-13 NOTE — Progress Notes (Signed)
Subjective:  Patient ID: Paul Mclaughlin, male    DOB: [birth year]  Age: 64 y.o. MRN: [id number]  CC: Diabetes   HPI Paul Mclaughlin is a 64 year old male with a history of type 2 diabetes mellitus (A1c 5.5), hypertension, atrial fibrillation (on anticoagulation with Xarelto and rate control with metoprolol), history of Congenital heart disease status post surgery, Pulmonary hypertension, congestive heart failure ( EF of 55-60% from 2-D echo 11/2016), peripheral arterial disease here for a follow-up visit.  He has been taking torsemide 20 mg twice daily and taking an extra pill when he feels he is in fluid overload but of recent he has noticed enlargement of his abdomen.  He denies presence of dyspnea or pedal edema and has no chest pain.  His weight has been stable and it appears he has lost 11 pounds in the last 4 months. Denies presence of lightheadedness, syncope, irregular heartbeat.  He does have some bruising from his Eliquis especially when he bumps into things. Has not been to see cardiology lately.  With regards to his diabetes mellitus he denies presence of hypoglycemia, numbness in extremities and is compliant with Metformin.  Doing well on his statin and his antihypertensive.  Past Medical History:  Diagnosis Date  . Atrial fibrillation ([place])   . Cellulitis and abscess of left leg 06/2016  . CHF (congestive heart failure) ([place])   . Diabetes ([place])   . Dyspnea     Past Surgical History:  Procedure Laterality Date  . AMPUTATION Left 06/24/2016   Procedure: FOOT FIFTH RAY TOE AMPUTATION;  Surgeon: Newt Minion, MD;  Location: [place];  Service: Orthopedics;  Laterality: Left;  . open heart surgery     As a child.  12 years old.  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   [name] RIGHT/LEFT HEART CATH AND CORONARY ANGIOGRAPHY N/A 03/30/2017   Procedure: Right/Left Heart Cath and Coronary Angiography;  Surgeon: Larey Dresser, MD;  Location: [place] [facility];  Service: Cardiovascular;  Laterality: N/A;     Family History  Problem Relation Age of Onset  . Diabetes Mellitus II Mother        ESRD  . Diabetes Mellitus II Brother   . Emphysema Father     No Known Allergies  Outpatient Medications Prior to Visit  Medication Sig Dispense Refill  . albuterol (PROVENTIL HFA;VENTOLIN HFA) 108 (90 Base) MCG/ACT inhaler Inhale 2 puffs into the lungs every 6 (six) hours as needed for wheezing or shortness of breath. 1 Inhaler 0  . atorvastatin (LIPITOR) 20 MG tablet Take 1 tablet (20 mg total) by mouth daily. 30 tablet 6  . cetirizine (ZYRTEC) 10 MG tablet Take 1 tablet (10 mg total) by mouth daily. 30 tablet 1  . glucose blood test strip Use as instructed 100 each 11  . hydrALAZINE (APRESOLINE) 100 MG tablet Take 1 tablet (100 mg total) by mouth 3 (three) times daily. 90 tablet 6  . Misc. Devices MISC Blood pressure monitor.  Diagnosis- hypertension 1 each 0  . TRUEplus Lancets 28G MISC USE AS DIRECTED 100 each 12  . metFORMIN (GLUCOPHAGE) 500 MG tablet Take 2 tablets (1,000 mg total) by mouth 2 (two) times daily with a meal. 120 tablet 6  . metoprolol tartrate (LOPRESSOR) 50 MG tablet Take 1 tablet (50 mg total) by mouth 2 (two) times daily. 60 tablet 6  . rivaroxaban (XARELTO) 20 MG TABS tablet TAKE 1 TABLET BY MOUTH DAILY WITH SUPPER. 30 tablet 6  . torsemide (DEMADEX) 20  MG tablet Alternate 60 mg (3 tabs), and 40 mg (2 tabs) daily 75 tablet 6  . amLODipine (NORVASC) 5 MG tablet Take 1 tablet (5 mg total) by mouth daily. 30 tablet 6  . gabapentin (NEURONTIN) 300 MG capsule TAKE 1 CAPSULE BY MOUTH 2 TIMES DAILY. (Patient not taking: Reported on 12/31/2019) 60 capsule 3   No facility-administered medications prior to visit.     ROS Review of Systems  Constitutional: Negative for activity change and appetite change.  HENT: Negative for sinus pressure and sore throat.   Eyes: Negative for visual disturbance.  Respiratory: Negative for cough, chest tightness and shortness of breath.    Cardiovascular: Negative for chest pain and leg swelling.  Gastrointestinal: Negative for abdominal distention, abdominal pain, constipation and diarrhea.  Endocrine: Negative.   Genitourinary: Negative for dysuria.  Musculoskeletal: Negative for joint swelling and myalgias.  Skin: Negative for rash.  Allergic/Immunologic: Negative.   Neurological: Negative for weakness, light-headedness and numbness.  Hematological: Bruises/bleeds easily.  Psychiatric/Behavioral: Negative for dysphoric mood and suicidal ideas.    Objective:  BP 128/69   Pulse (!) 55   Ht _0  (1.854 m)   Wt 255 lb (115.7 kg)   SpO2 96%   BMI 33.64 kg/m   BP/Weight 05/13/2020 12/31/2019 58/30/9407  Systolic BP 680 881 103  Diastolic BP 69 90 68  Wt. (Lbs) 255 266 270.6  BMI 33.64 35.09 35.7      Physical Exam Constitutional:      Appearance: He is well-developed.  Neck:     Vascular: No JVD.  Cardiovascular:     Rate and Rhythm: Bradycardia present.     Pulses:          Dorsalis pedis pulses are 0 on the right side and 2+ on the left side.       Posterior tibial pulses are 0 on the right side and 2+ on the left side.     Heart sounds: Normal heart sounds. No murmur heard.   Pulmonary:     Effort: Pulmonary effort is normal.     Breath sounds: Normal breath sounds. No wheezing or rales.  Chest:     Chest wall: No tenderness.  Abdominal:     General: Bowel sounds are normal. There is distension.     Palpations: Abdomen is soft. There is no mass.     Tenderness: There is no abdominal tenderness.  Musculoskeletal:        General: Normal range of motion.     Right lower leg: Edema (1+) present.     Left lower leg: Edema (1+) present.  Skin:    Findings: Bruising present.  Neurological:     Mental Status: He is alert and oriented to person, place, and time.  Psychiatric:        Mood and Affect: Mood normal.     CMP Latest Ref Rng & Units 12/31/2019 12/13/2018 12/22/2017  Glucose 65 - 99 mg/dL  113(H) 105(H) 120(H)  BUN 8 - 27 mg/dL 15 21 31(H)  Creatinine 0.76 - 1.27 mg/dL 1.07 0.99 1.23  Sodium 134 - 144 mmol/L 143 139 136  Potassium 3.5 - 5.2 mmol/L 4.4 4.5 4.5  Chloride 96 - 106 mmol/L 102 100 104  CO2 20 - 29 mmol/L _1 Calcium 8.6 - 10.2 mg/dL 9.5 9.5 9.3  Total Protein 6.0 - 8.5 g/dL 7.6 7.4 -  Total Bilirubin 0.0 - 1.2 mg/dL 1.6(H) 1.1 -  Alkaline Phos 39 - 117  IU/L 188(H) 201(H) -  AST 0 - 40 IU/L 15 26 -  ALT 0 - 44 IU/L 6 31 -    Lipid Panel     Component Value Date/Time   CHOL 120 12/31/2019 1520   TRIG 61 12/31/2019 1520   HDL 33 (L) 12/31/2019 1520   CHOLHDL 3.6 12/31/2019 1520   CHOLHDL 2.3 10/03/2017 1457   VLDL 14 10/03/2017 1457   LDLCALC 74 12/31/2019 1520    CBC    Component Value Date/Time   WBC 9.0 12/08/2017 1518   RBC 3.52 (L) 12/08/2017 1518   HGB 11.1 (L) 12/08/2017 1518   HGB 9.9 (L) 03/16/2017 1644   HCT 34.6 (L) 12/08/2017 1518   HCT 30.1 (L) 03/16/2017 1644   PLT 211 12/08/2017 1518   PLT 222 03/16/2017 1644   MCV 98.3 12/08/2017 1518   MCV 97 03/16/2017 1644   MCH 31.5 12/08/2017 1518   MCHC 32.1 12/08/2017 1518   RDW 14.3 12/08/2017 1518   RDW 13.8 03/16/2017 1644   LYMPHSABS 0.4 (L) 03/31/2017 0330   MONOABS 0.8 03/31/2017 0330   EOSABS 0.1 03/31/2017 0330   BASOSABS 0.0 03/31/2017 0330    Lab Results  Component Value Date   HGBA1C 5.5 05/13/2020    Assessment & Plan:  1. Type 2 diabetes mellitus with complication, without long-term current use of insulin (HCC) Controlled with A1c of 5.5; goal is less than 7.0 Decrease Metformin dose to prevent hypoglycemia Counseled on Diabetic diet, my plate method, 323 minutes of moderate intensity exercise/week Blood sugar logs with fasting goals of 80-120 mg/dl, random of less than 180 and in the event of sugars less than 60 mg/dl or greater than 400 mg/dl encouraged to notify the clinic. Advised on the need for annual eye exams, annual foot exams, Pneumonia vaccine. -  POCT glucose (manual entry) - POCT glycosylated hemoglobin (Hb A1C) - metFORMIN (GLUCOPHAGE) 500 MG tablet; Take 1 tablet (500 mg total) by mouth 2 (two) times daily with a meal.  Dispense: 180 tablet; Refill: 1 - CMP14+EGFR - Microalbumin / creatinine urine ratio  2. Paroxysmal atrial fibrillation (HCC) Stable On rate control with metoprolol Anticoagulation with Eliquis - rivaroxaban (XARELTO) 20 MG TABS tablet; TAKE 1 TABLET BY MOUTH DAILY WITH SUPPER.  Dispense: 90 tablet; Refill: 1  3. Hypertensive heart disease with chronic combined systolic and diastolic congestive heart failure (HCC) EF of 55 to 60% from echo of 11/2016 He does have some abdominal distention and slight pedal edema which is concerning for fluid overload Advised to increase his torsemide to 40 mg twice daily Limit sodium intake, use compression stockings - torsemide (DEMADEX) 20 MG tablet; Take 2 tablets (40 mg total) by mouth 2 (two) times daily.  Dispense: 120 tablet; Refill: 3  4. Essential hypertension Controlled Counseled on blood pressure goal of less than 130/80, low-sodium, DASH diet, medication compliance, 150 minutes of moderate intensity exercise per week. Discussed medication compliance, adverse effects. - metoprolol tartrate (LOPRESSOR) 50 MG tablet; Take 1 tablet (50 mg total) by mouth 2 (two) times daily.  Dispense: 180 tablet; Refill: 1  5. Need for hepatitis C screening test - HCV RNA quant rflx ultra or genotyp([facility])   Meds ordered this encounter  Medications  . metFORMIN (GLUCOPHAGE) 500 MG tablet    Sig: Take 1 tablet (500 mg total) by mouth 2 (two) times daily with a meal.    Dispense:  180 tablet    Refill:  1  . rivaroxaban (XARELTO) 20  MG TABS tablet    Sig: TAKE 1 TABLET BY MOUTH DAILY WITH SUPPER.    Dispense:  90 tablet    Refill:  1  . torsemide (DEMADEX) 20 MG tablet    Sig: Take 2 tablets (40 mg total) by mouth 2 (two) times daily.    Dispense:  120 tablet     Refill:  3  . metoprolol tartrate (LOPRESSOR) 50 MG tablet    Sig: Take 1 tablet (50 mg total) by mouth 2 (two) times daily.    Dispense:  180 tablet    Refill:  1    Follow-up: Return in about 6 months (around 11/10/2020) for Chronic disease management.       Charlott Rakes, MD, FAAFP. [facility], [place]   05/13/2020, 3:56 PM

## 2020-05-14 LAB — CMP14+EGFR
ALT: 7 IU/L (ref 0–44)
AST: 11 IU/L (ref 0–40)
Albumin/Globulin Ratio: 1.5 (ref 1.2–2.2)
Albumin: 4.9 g/dL — ABNORMAL HIGH (ref 3.8–4.8)
Alkaline Phosphatase: 145 IU/L — ABNORMAL HIGH (ref 48–121)
BUN/Creatinine Ratio: 23 (ref 10–24)
BUN: 28 mg/dL — ABNORMAL HIGH (ref 8–27)
Bilirubin Total: 1.2 mg/dL (ref 0.0–1.2)
CO2: 24 mmol/L (ref 20–29)
Calcium: 9.7 mg/dL (ref 8.6–10.2)
Chloride: 101 mmol/L (ref 96–106)
Creatinine, Ser: 1.2 mg/dL (ref 0.76–1.27)
GFR calc Af Amer: 74 mL/min/1.73 (ref >59)
GFR calc non Af Amer: 64 mL/min/1.73 (ref >59)
Globulin, Total: 3.2 g/dL (ref 1.5–4.5)
Glucose: 114 mg/dL — ABNORMAL HIGH (ref 65–99)
Potassium: 4.1 mmol/L (ref 3.5–5.2)
Sodium: 140 mmol/L (ref 134–144)
Total Protein: 8.1 g/dL (ref 6.0–8.5)

## 2020-05-14 LAB — HCV RNA QUANT RFLX ULTRA OR GENOTYP: HCV Quant Baseline: NOT DETECTED [IU]/mL

## 2020-05-14 LAB — MICROALBUMIN / CREATININE URINE RATIO
Creatinine, Urine: 27.5 mg/dL
Microalb/Creat Ratio: 140 mg/g{creat} — ABNORMAL HIGH (ref 0–29)
Microalbumin, Urine: 38.6 ug/mL

## 2020-05-15 ENCOUNTER — Other Ambulatory Visit: Payer: Self-pay | Admitting: Family Medicine

## 2020-05-15 ENCOUNTER — Telehealth: Payer: Self-pay

## 2020-05-15 MED ORDER — LISINOPRIL 2.5 MG PO TABS: 2.5000 mg | ORAL_TABLET | Freq: Every day | ORAL | 3 refills | Status: DC

## 2020-05-15 MED FILL — LISINOPRIL 2.5 MG TABLET: 2.5 | 30 days supply | Qty: 30 | Fill #0

## 2020-05-15 NOTE — Telephone Encounter (Signed)
-----   Message from [name], MD sent at 05/15/2020  9:40 AM EDT ----- Labs reveal his kidneys are spilling out protein and I have added a low dose of Lisinopril to help with this. Other labs are stable

## 2020-05-15 NOTE — Telephone Encounter (Signed)
Patient name and DOB has been verified Patient was informed of lab results. Patient had no questions.  

## 2020-05-27 MED FILL — METOPROLOL TARTRATE 50 MG T: 50 | 30 days supply | Qty: 60 | Fill #5

## 2020-05-27 MED FILL — AMLODIPINE BESYLATE 5 MG TA: 5 | 30 days supply | Qty: 30 | Fill #5

## 2020-05-29 MED FILL — TORSEMIDE 20 MG TABLET: 20 | 30 days supply | Qty: 75 | Fill #2

## 2020-06-16 MED FILL — LISINOPRIL 2.5 MG TABLET: 2.5 | 30 days supply | Qty: 30 | Fill #1

## 2020-06-16 MED FILL — METFORMIN HCL 500 MG TABS: 500 | 30 days supply | Qty: 120 | Fill #5

## 2020-06-16 MED FILL — hydrALAZINE HCL 100 MG TABS: 100 | 90 days supply | Qty: 270 | Fill #2

## 2020-06-29 MED FILL — METOPROLOL TARTRATE 50 MG T: 50 | 30 days supply | Qty: 60 | Fill #6

## 2020-06-29 MED FILL — AMLODIPINE BESYLATE 5 MG TA: 5 | 30 days supply | Qty: 30 | Fill #6

## 2020-07-16 MED FILL — LISINOPRIL 2.5 MG TABLET: 2.5 | 30 days supply | Qty: 30 | Fill #2

## 2020-07-16 MED FILL — TORSEMIDE 20 MG TABLET: 20 | 30 days supply | Qty: 75 | Fill #3

## 2020-07-16 MED FILL — METFORMIN HCL 500 MG TABS: 500 | 30 days supply | Qty: 120 | Fill #6

## 2020-07-27 ENCOUNTER — Other Ambulatory Visit: Payer: Self-pay | Admitting: Pharmacist

## 2020-07-27 ENCOUNTER — Other Ambulatory Visit: Payer: Self-pay | Admitting: Family Medicine

## 2020-07-27 DIAGNOSIS — E118 Type 2 diabetes mellitus with unspecified complications: Secondary | ICD-10-CM

## 2020-07-27 DIAGNOSIS — I10 Essential (primary) hypertension: Secondary | ICD-10-CM

## 2020-07-27 MED ORDER — ACCU-CHEK GUIDE ME W/DEVICE KIT: PACK | 0 refills | Status: DC

## 2020-07-27 MED ORDER — ACCU-CHEK SOFTCLIX LANCETS MISC: 2 refills | Status: DC

## 2020-07-27 MED ORDER — ACCU-CHEK GUIDE VI STRP: ORAL_STRIP | 3 refills | Status: DC

## 2020-07-27 MED FILL — METOPROLOL TARTRATE 50 MG T: 50 | 90 days supply | Qty: 180 | Fill #0

## 2020-07-27 MED FILL — ATORVASTATIN CALCIUM 20 MG: 20 | 90 days supply | Qty: 90 | Fill #0

## 2020-07-27 NOTE — Telephone Encounter (Signed)
appt schedule in 3 months

## 2020-07-27 NOTE — Telephone Encounter (Signed)
Requested medication (s) are due for refill today: expired medication  Requested medication (s) are on the active medication list: yes  Last refill:  start: 12/31/19 end: 03/30/20 #30 6 refills   Future visit scheduled: yes in 3 months   Notes to clinic:  medication expired. Do you want to renew Rx?     Requested Prescriptions  Pending Prescriptions Disp Refills   amLODipine (NORVASC) 5 MG tablet [Pharmacy Med Name: AMLODIPINE BESYLATE 5 MG TA 5 Tablet] 30 tablet 6    Sig: TAKE 1 TABLET (5 MG TOTAL) BY MOUTH DAILY.      Cardiovascular:  Calcium Channel Blockers Passed - 07/27/2020  3:50 PM      Passed - Last BP in normal range    BP Readings from Last 1 Encounters:  05/13/20 128/69          Passed - Valid encounter within last 6 months    Recent Outpatient Visits           2 months ago Type 2 diabetes mellitus with complication, without long-term current use of insulin (HCC)   [facility] [place], [place], MD   6 months ago Type 2 diabetes mellitus with complication, without long-term current use of insulin (HCC)   [facility] [place], [place], MD   1 year ago Type 2 diabetes mellitus with complication, without long-term current use of insulin (HCC)   [facility] [place], [place], MD   1 year ago Type 2 diabetes mellitus with complication, without long-term current use of insulin (HCC)   [facility] [place], [place], MD   2 years ago Type 2 diabetes mellitus with other neurologic complication, without long-term current use of insulin (HCC)   [facility] [name], MD       Future Appointments             In 3 months [name], MD [facility]             Signed Prescriptions Disp Refills   atorvastatin (LIPITOR) 20 MG tablet 90 tablet 0    Sig: TAKE 1 TABLET (20 MG TOTAL) BY MOUTH  DAILY.      Cardiovascular:  Antilipid - Statins Failed - 07/27/2020  3:50 PM      Failed - LDL in normal range and within 360 days    LDL Chol Calc (NIH)  Date Value Ref Range Status  12/31/2019 74 0 - 99 mg/dL Final          Failed - HDL in normal range and within 360 days    HDL  Date Value Ref Range Status  12/31/2019 33 (L) >39 mg/dL Final          Passed - Total Cholesterol in normal range and within 360 days    Cholesterol, Total  Date Value Ref Range Status  12/31/2019 120 100 - 199 mg/dL Final          Passed - Triglycerides in normal range and within 360 days    Triglycerides  Date Value Ref Range Status  12/31/2019 61 0 - 149 mg/dL Final          Passed - Patient is not pregnant      Passed - Valid encounter within last 12 months    Recent Outpatient Visits           2 months ago  Type 2 diabetes mellitus with complication, without long-term current use of insulin (HCC)   [facility] [place], [place], MD   6 months ago Type 2 diabetes mellitus with complication, without long-term current use of insulin (HCC)   [facility] [place], [place], MD   1 year ago Type 2 diabetes mellitus with complication, without long-term current use of insulin (HCC)   [facility] [place], Odette Horns, MD   1 year ago Type 2 diabetes mellitus with complication, without long-term current use of insulin (HCC)   [facility] [place], Odette Horns, MD   2 years ago Type 2 diabetes mellitus with other neurologic complication, without long-term current use of insulin (HCC)    [facility] [name], MD       Future Appointments             In 3 months [name], MD [facility]

## 2020-07-28 ENCOUNTER — Other Ambulatory Visit: Payer: Self-pay | Admitting: Family Medicine

## 2020-07-28 MED FILL — ACCU-CHEK GUIDE ME W/DEVICE: W/DEVICE | 1 days supply | Qty: 1 | Fill #0

## 2020-07-28 MED FILL — ACCU-CHEK GUIDE TEST STRIP: 25 days supply | Qty: 100 | Fill #0

## 2020-07-28 MED FILL — ACCU-CHEK SOFTCLIX LANCETS: 25 days supply | Qty: 100 | Fill #0

## 2020-07-28 MED FILL — AMLODIPINE BESYLATE 5 MG TA: 5 | 30 days supply | Qty: 30 | Fill #0

## 2020-08-17 MED FILL — LISINOPRIL 2.5 MG TABLET: 2.5 | 30 days supply | Qty: 30 | Fill #3

## 2020-08-17 MED FILL — METFORMIN HCL 500 MG TABS: 500 | 90 days supply | Qty: 180 | Fill #0

## 2020-08-31 MED FILL — AMLODIPINE BESYLATE 5 MG TA: 5 | 90 days supply | Qty: 90 | Fill #1

## 2020-08-31 MED FILL — XARELTO 20 MG TABLET: 20 | 30 days supply | Qty: 30 | Fill #2

## 2020-09-15 ENCOUNTER — Other Ambulatory Visit: Payer: Self-pay | Admitting: Family Medicine

## 2020-09-15 MED FILL — TORSEMIDE 20 MG TABLET: 20 | 30 days supply | Qty: 75 | Fill #4

## 2020-09-15 MED FILL — LISINOPRIL 2.5 MG TABLET: 2.5 | 30 days supply | Qty: 30 | Fill #0

## 2020-10-05 ENCOUNTER — Other Ambulatory Visit: Payer: Self-pay | Admitting: Family Medicine

## 2020-10-05 DIAGNOSIS — I10 Essential (primary) hypertension: Secondary | ICD-10-CM

## 2020-10-05 MED FILL — XARELTO 20 MG TABLET: 20 | 90 days supply | Qty: 90 | Fill #0

## 2020-10-06 MED FILL — hydrALAZINE HCL 100 MG TABS: 100 | 90 days supply | Qty: 270 | Fill #0

## 2020-10-20 ENCOUNTER — Other Ambulatory Visit: Payer: Self-pay | Admitting: Family Medicine

## 2020-10-20 DIAGNOSIS — E118 Type 2 diabetes mellitus with unspecified complications: Secondary | ICD-10-CM

## 2020-10-20 MED FILL — TORSEMIDE 20 MG TABLET: 20 | 30 days supply | Qty: 75 | Fill #5

## 2020-10-20 MED FILL — LISINOPRIL 2.5 MG TABLET: 2.5 | 30 days supply | Qty: 30 | Fill #0

## 2020-10-20 MED FILL — ATORVASTATIN CALCIUM 20 MG: 20 | 30 days supply | Qty: 30 | Fill #0

## 2020-11-09 MED FILL — METFORMIN HCL 500 MG TABS: 500 | 90 days supply | Qty: 180 | Fill #1

## 2020-11-09 MED FILL — METOPROLOL TARTRATE 50 MG T: 50 | 90 days supply | Qty: 180 | Fill #1

## 2020-11-10 ENCOUNTER — Other Ambulatory Visit: Payer: Self-pay | Admitting: Family Medicine

## 2020-11-10 ENCOUNTER — Other Ambulatory Visit: Payer: Self-pay

## 2020-11-10 ENCOUNTER — Emergency Department (HOSPITAL_COMMUNITY): Payer: Medicaid Other

## 2020-11-10 ENCOUNTER — Encounter (HOSPITAL_COMMUNITY): Payer: Self-pay | Admitting: Emergency Medicine

## 2020-11-10 ENCOUNTER — Encounter: Payer: Self-pay | Admitting: Family Medicine

## 2020-11-10 ENCOUNTER — Inpatient Hospital Stay (HOSPITAL_COMMUNITY)
Admission: EM | Admit: 2020-11-10 | Discharge: 2020-11-18 | DRG: 177 | Disposition: A | Discharge status: Home / Self Care | Payer: Medicaid Other | Attending: Internal Medicine | Admitting: Internal Medicine

## 2020-11-10 ENCOUNTER — Ambulatory Visit: Payer: Medicaid Other | Attending: Family Medicine | Admitting: Family Medicine

## 2020-11-10 VITALS — BP 139/81 | HR 55 | Ht 73.0 in | Wt 306.0 lb

## 2020-11-10 DIAGNOSIS — I480 Paroxysmal atrial fibrillation: Secondary | ICD-10-CM | POA: Diagnosis present

## 2020-11-10 DIAGNOSIS — I5043 Acute on chronic combined systolic (congestive) and diastolic (congestive) heart failure: Secondary | ICD-10-CM | POA: Diagnosis not present

## 2020-11-10 DIAGNOSIS — J1282 Pneumonia due to coronavirus disease 2019: Secondary | ICD-10-CM | POA: Diagnosis present

## 2020-11-10 DIAGNOSIS — I5082 Biventricular heart failure: Secondary | ICD-10-CM | POA: Diagnosis present

## 2020-11-10 DIAGNOSIS — I5042 Chronic combined systolic (congestive) and diastolic (congestive) heart failure: Secondary | ICD-10-CM | POA: Diagnosis not present

## 2020-11-10 DIAGNOSIS — I2729 Other secondary pulmonary hypertension: Secondary | ICD-10-CM | POA: Diagnosis present

## 2020-11-10 DIAGNOSIS — E118 Type 2 diabetes mellitus with unspecified complications: Secondary | ICD-10-CM | POA: Diagnosis not present

## 2020-11-10 DIAGNOSIS — R932 Abnormal findings on diagnostic imaging of liver and biliary tract: Secondary | ICD-10-CM

## 2020-11-10 DIAGNOSIS — I482 Chronic atrial fibrillation, unspecified: Secondary | ICD-10-CM | POA: Diagnosis not present

## 2020-11-10 DIAGNOSIS — K219 Gastro-esophageal reflux disease without esophagitis: Secondary | ICD-10-CM | POA: Diagnosis present

## 2020-11-10 DIAGNOSIS — I50810 Right heart failure, unspecified: Secondary | ICD-10-CM

## 2020-11-10 DIAGNOSIS — K7460 Unspecified cirrhosis of liver: Secondary | ICD-10-CM | POA: Diagnosis present

## 2020-11-10 DIAGNOSIS — I110 Hypertensive heart disease with heart failure: Secondary | ICD-10-CM

## 2020-11-10 DIAGNOSIS — Z7901 Long term (current) use of anticoagulants: Secondary | ICD-10-CM

## 2020-11-10 DIAGNOSIS — Z7984 Long term (current) use of oral hypoglycemic drugs: Secondary | ICD-10-CM

## 2020-11-10 DIAGNOSIS — E1122 Type 2 diabetes mellitus with diabetic chronic kidney disease: Secondary | ICD-10-CM | POA: Diagnosis present

## 2020-11-10 DIAGNOSIS — I10 Essential (primary) hypertension: Secondary | ICD-10-CM | POA: Diagnosis not present

## 2020-11-10 DIAGNOSIS — N1831 Chronic kidney disease, stage 3a: Secondary | ICD-10-CM | POA: Diagnosis present

## 2020-11-10 DIAGNOSIS — I5033 Acute on chronic diastolic (congestive) heart failure: Secondary | ICD-10-CM | POA: Diagnosis not present

## 2020-11-10 DIAGNOSIS — Z79899 Other long term (current) drug therapy: Secondary | ICD-10-CM

## 2020-11-10 DIAGNOSIS — Z6841 Body Mass Index (BMI) 40.0 and over, adult: Secondary | ICD-10-CM | POA: Diagnosis not present

## 2020-11-10 DIAGNOSIS — R0902 Hypoxemia: Secondary | ICD-10-CM | POA: Diagnosis present

## 2020-11-10 DIAGNOSIS — R188 Other ascites: Secondary | ICD-10-CM

## 2020-11-10 DIAGNOSIS — E785 Hyperlipidemia, unspecified: Secondary | ICD-10-CM | POA: Diagnosis present

## 2020-11-10 DIAGNOSIS — I130 Hypertensive heart and chronic kidney disease with heart failure and stage 1 through stage 4 chronic kidney disease, or unspecified chronic kidney disease: Secondary | ICD-10-CM | POA: Diagnosis present

## 2020-11-10 DIAGNOSIS — Z833 Family history of diabetes mellitus: Secondary | ICD-10-CM

## 2020-11-10 DIAGNOSIS — I2510 Atherosclerotic heart disease of native coronary artery without angina pectoris: Secondary | ICD-10-CM | POA: Diagnosis present

## 2020-11-10 DIAGNOSIS — I509 Heart failure, unspecified: Secondary | ICD-10-CM

## 2020-11-10 DIAGNOSIS — Z8774 Personal history of (corrected) congenital malformations of heart and circulatory system: Secondary | ICD-10-CM

## 2020-11-10 DIAGNOSIS — R0602 Shortness of breath: Secondary | ICD-10-CM | POA: Diagnosis not present

## 2020-11-10 DIAGNOSIS — E1151 Type 2 diabetes mellitus with diabetic peripheral angiopathy without gangrene: Secondary | ICD-10-CM | POA: Diagnosis present

## 2020-11-10 DIAGNOSIS — U071 COVID-19: Secondary | ICD-10-CM | POA: Diagnosis not present

## 2020-11-10 DIAGNOSIS — E66813 Obesity, class 3: Secondary | ICD-10-CM

## 2020-11-10 DIAGNOSIS — Z951 Presence of aortocoronary bypass graft: Secondary | ICD-10-CM

## 2020-11-10 LAB — GLUCOSE, POCT (MANUAL RESULT ENTRY): POC Glucose: 136 mg/dl — AB (ref 70–99)

## 2020-11-10 LAB — CBC
HCT: 28.7 % — ABNORMAL LOW (ref 39.0–52.0)
Hemoglobin: 9.4 g/dL — ABNORMAL LOW (ref 13.0–17.0)
MCH: 31.6 pg (ref 26.0–34.0)
MCHC: 32.8 g/dL (ref 30.0–36.0)
MCV: 96.6 fL (ref 80.0–100.0)
Platelets: 252 10*3/uL (ref 150–400)
RBC: 2.97 MIL/uL — ABNORMAL LOW (ref 4.22–5.81)
RDW: 15.5 % (ref 11.5–15.5)
WBC: 3.3 10*3/uL — ABNORMAL LOW (ref 4.0–10.5)
nRBC: 0 % (ref 0.0–0.2)

## 2020-11-10 LAB — HEPATIC FUNCTION PANEL
ALT: 11 U/L (ref 0–44)
AST: 20 U/L (ref 15–41)
Albumin: 3.3 g/dL — ABNORMAL LOW (ref 3.5–5.0)
Alkaline Phosphatase: 113 U/L (ref 38–126)
Bilirubin, Direct: 0.5 mg/dL — ABNORMAL HIGH (ref 0.0–0.2)
Indirect Bilirubin: 1.2 mg/dL — ABNORMAL HIGH (ref 0.3–0.9)
Total Bilirubin: 1.7 mg/dL — ABNORMAL HIGH (ref 0.3–1.2)
Total Protein: 7.1 g/dL (ref 6.5–8.1)

## 2020-11-10 LAB — POCT GLYCOSYLATED HEMOGLOBIN (HGB A1C): Hemoglobin A1C: 5.3 % (ref 4.0–5.6)

## 2020-11-10 LAB — BASIC METABOLIC PANEL WITH GFR
Anion gap: 10 (ref 5–15)
BUN: 21 mg/dL (ref 8–23)
CO2: 26 mmol/L (ref 22–32)
Calcium: 8.7 mg/dL — ABNORMAL LOW (ref 8.9–10.3)
Chloride: 101 mmol/L (ref 98–111)
Creatinine, Ser: 1.47 mg/dL — ABNORMAL HIGH (ref 0.61–1.24)
GFR, Estimated: 53 mL/min — ABNORMAL LOW
Glucose, Bld: 119 mg/dL — ABNORMAL HIGH (ref 70–99)
Potassium: 3.8 mmol/L (ref 3.5–5.1)
Sodium: 137 mmol/L (ref 135–145)

## 2020-11-10 LAB — DIFFERENTIAL
Abs Immature Granulocytes: 0.01 10*3/uL (ref 0.00–0.07)
Basophils Absolute: 0 10*3/uL (ref 0.0–0.1)
Basophils Relative: 0 %
Eosinophils Absolute: 0.1 10*3/uL (ref 0.0–0.5)
Eosinophils Relative: 2 %
Immature Granulocytes: 0 %
Lymphocytes Relative: 13 %
Lymphs Abs: 0.4 10*3/uL — ABNORMAL LOW (ref 0.7–4.0)
Monocytes Absolute: 0.3 10*3/uL (ref 0.1–1.0)
Monocytes Relative: 11 %
Neutro Abs: 2.4 10*3/uL (ref 1.7–7.7)
Neutrophils Relative %: 74 %

## 2020-11-10 LAB — BRAIN NATRIURETIC PEPTIDE: B Natriuretic Peptide: 693 pg/mL — ABNORMAL HIGH (ref 0.0–100.0)

## 2020-11-10 LAB — TROPONIN I (HIGH SENSITIVITY)
Troponin I (High Sensitivity): 21 ng/L — ABNORMAL HIGH
Troponin I (High Sensitivity): 21 ng/L — ABNORMAL HIGH (ref <18)

## 2020-11-10 MED ORDER — FUROSEMIDE 10 MG/ML IJ SOLN
60.0000 mg | Freq: Two times a day (BID) | INTRAMUSCULAR | Status: DC
  Administered 2020-11-11 (×2): 60 mg via INTRAVENOUS
  Filled 2020-11-10 (×2): qty 6

## 2020-11-10 MED ORDER — SODIUM CHLORIDE 0.9% FLUSH
3.0000 mL | Freq: Two times a day (BID) | INTRAVENOUS | Status: DC
  Administered 2020-11-11 – 2020-11-12 (×3): 3 mL via INTRAVENOUS

## 2020-11-10 MED ORDER — DAPAGLIFLOZIN PROPANEDIOL 5 MG PO TABS: 5.0000 mg | ORAL_TABLET | Freq: Every day | ORAL | 6 refills | Status: DC

## 2020-11-10 MED ORDER — RIVAROXABAN 20 MG PO TABS: ORAL_TABLET | ORAL | 1 refills | Status: DC

## 2020-11-10 MED ORDER — PANTOPRAZOLE SODIUM 40 MG PO TBEC
40.0000 mg | DELAYED_RELEASE_TABLET | Freq: Every day | ORAL | Status: DC
  Administered 2020-11-11 – 2020-11-18 (×8): 40 mg via ORAL
  Filled 2020-11-10 (×8): qty 1

## 2020-11-10 MED ORDER — RIVAROXABAN 20 MG PO TABS
20.0000 mg | ORAL_TABLET | Freq: Every day | ORAL | Status: DC
  Administered 2020-11-11 – 2020-11-17 (×7): 20 mg via ORAL
  Filled 2020-11-10 (×7): qty 1

## 2020-11-10 MED ORDER — ATORVASTATIN CALCIUM 20 MG PO TABS
20.0000 mg | ORAL_TABLET | Freq: Every day | ORAL | 6 refills | Status: DC
Start: 2020-11-10 — End: 2020-11-10

## 2020-11-10 MED ORDER — ALBUTEROL SULFATE HFA 108 (90 BASE) MCG/ACT IN AERS
2.0000 | INHALATION_SPRAY | Freq: Four times a day (QID) | RESPIRATORY_TRACT | Status: DC | PRN
  Filled 2020-11-10: qty 6.7

## 2020-11-10 MED ORDER — HYDRALAZINE HCL 50 MG PO TABS
100.0000 mg | ORAL_TABLET | Freq: Three times a day (TID) | ORAL | Status: DC
  Administered 2020-11-11: 100 mg via ORAL
  Filled 2020-11-10 (×3): qty 2

## 2020-11-10 MED ORDER — LISINOPRIL 5 MG PO TABS
2.5000 mg | ORAL_TABLET | Freq: Every day | ORAL | Status: DC
  Administered 2020-11-11: 2.5 mg via ORAL
  Filled 2020-11-10: qty 1

## 2020-11-10 MED ORDER — LORATADINE 10 MG PO TABS
10.0000 mg | ORAL_TABLET | Freq: Every day | ORAL | Status: DC
  Administered 2020-11-11 – 2020-11-18 (×8): 10 mg via ORAL
  Filled 2020-11-10 (×8): qty 1

## 2020-11-10 MED ORDER — METOPROLOL TARTRATE 50 MG PO TABS: 50.0000 mg | ORAL_TABLET | Freq: Two times a day (BID) | ORAL | 1 refills | Status: DC

## 2020-11-10 MED ORDER — ONDANSETRON HCL 4 MG/2ML IJ SOLN: 4.0000 mg | Freq: Four times a day (QID) | INTRAMUSCULAR | Status: DC | PRN

## 2020-11-10 MED ORDER — ATORVASTATIN CALCIUM 10 MG PO TABS
20.0000 mg | ORAL_TABLET | Freq: Every day | ORAL | Status: DC
  Administered 2020-11-11 – 2020-11-18 (×8): 20 mg via ORAL
  Filled 2020-11-10 (×8): qty 2

## 2020-11-10 MED ORDER — FUROSEMIDE 10 MG/ML IJ SOLN
80.0000 mg | Freq: Once | INTRAMUSCULAR | Status: AC
  Administered 2020-11-10: 80 mg via INTRAVENOUS
  Filled 2020-11-10: qty 8

## 2020-11-10 MED ORDER — PANTOPRAZOLE SODIUM 40 MG PO TBEC: 40.0000 mg | DELAYED_RELEASE_TABLET | Freq: Every day | ORAL | 3 refills | Status: DC

## 2020-11-10 MED ORDER — ASPIRIN 81 MG PO CHEW
324.0000 mg | CHEWABLE_TABLET | Freq: Once | ORAL | Status: AC
  Administered 2020-11-10: 324 mg via ORAL
  Filled 2020-11-10: qty 4

## 2020-11-10 MED ORDER — LISINOPRIL 2.5 MG PO TABS: 2.5000 mg | ORAL_TABLET | Freq: Every day | ORAL | 6 refills | Status: DC

## 2020-11-10 MED ORDER — SODIUM CHLORIDE 0.9% FLUSH: 3.0000 mL | INTRAVENOUS | Status: DC | PRN

## 2020-11-10 MED ORDER — SODIUM CHLORIDE 0.9 % IV SOLN: 250.0000 mL | INTRAVENOUS | Status: DC | PRN

## 2020-11-10 MED ORDER — ACETAMINOPHEN 325 MG PO TABS: 650.0000 mg | ORAL_TABLET | ORAL | Status: DC | PRN

## 2020-11-10 MED ORDER — INSULIN ASPART 100 UNIT/ML ~~LOC~~ SOLN: 0.0000 [IU] | Freq: Three times a day (TID) | SUBCUTANEOUS | Status: DC

## 2020-11-10 MED FILL — FARXIGA 5 MG TABLET: 5 | 30 days supply | Qty: 30 | Fill #0

## 2020-11-10 MED FILL — PANTOPRAZOLE SOD DR 40 MG T: 40 | 30 days supply | Qty: 30 | Fill #0

## 2020-11-10 NOTE — Progress Notes (Signed)
Subjective:  Patient ID: Paul Mclaughlin, male    DOB: Aug 17, 1956  Age: 65 y.o. MRN: [id number]  CC: Diabetes   HPI Paul Mclaughlin  is a 65 year old male with a history of type 2 diabetes mellitus (A1c 5.3), hypertension, atrial fibrillation (on anticoagulation with Xarelto and rate control with metoprolol), history of Congenital heart disease status post surgery, Pulmonary hypertension, congestive heart failure ( EF of 55-60% from 2-D echo 11/2016), peripheral arterial disease here for a follow-up visit. He complains of feeling weak and dyspneic.  Complains of dyspnea on mild exertion. symptoms have been present for the last 10 days. He has had pedal edema and cough. He has 3 pillow orthopnea.  He has no chest pain or palpitation.  Of note he has gained 56 pounds in the last 6 months. States he has been compliant with his Torsemide and recently started working on watching his fluid intake in his diet.  He has been out of Metformin for 3 weeks; A1c is 5.3 and he denies planes of hypoglycemia.  He has no visual concerns or numbness in extremities. Compliant with his antihypertensive and is doing well on Xarelto with no complaints of bleeding. He did have an episode of 'heartburn' worse on lying down a couple of days ago.  Denies presence of abdominal pain.  Past Medical History:  Diagnosis Date  . Atrial fibrillation ([place])   . Cellulitis and abscess of left leg 06/2016  . CHF (congestive heart failure) ([place])   . Diabetes ([place])   . Dyspnea     Past Surgical History:  Procedure Laterality Date  . AMPUTATION Left 06/24/2016   Procedure: FOOT FIFTH RAY TOE AMPUTATION;  Surgeon: Newt Minion, MD;  Location: [facility];  Service: Orthopedics;  Laterality: Left;  . open heart surgery     As a child.  55 years old.  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   [name] RIGHT/LEFT HEART CATH AND CORONARY ANGIOGRAPHY N/A 03/30/2017   Procedure: Right/Left Heart Cath and Coronary Angiography;  Surgeon: Larey Dresser, MD;   Location: [facility];  Service: Cardiovascular;  Laterality: N/A;    Family History  Problem Relation Age of Onset  . Diabetes Mellitus II Mother        ESRD  . Diabetes Mellitus II Brother   . Emphysema Father     No Known Allergies  Outpatient Medications Prior to Visit  Medication Sig Dispense Refill  . Accu-Chek Softclix Lancets lancets Use to check blood sugar daily. 100 each 2  . albuterol (PROVENTIL HFA;VENTOLIN HFA) 108 (90 Base) MCG/ACT inhaler Inhale 2 puffs into the lungs every 6 (six) hours as needed for wheezing or shortness of breath. 1 Inhaler 0  . Blood Glucose Monitoring Suppl (ACCU-CHEK GUIDE ME) w/Device KIT Use to check blood sugar daily. 1 kit 0  . cetirizine (ZYRTEC) 10 MG tablet Take 1 tablet (10 mg total) by mouth daily. 30 tablet 1  . glucose blood (ACCU-CHEK GUIDE) test strip Use to check blood sugar daily. 100 each 3  . hydrALAZINE (APRESOLINE) 100 MG tablet TAKE 1 TABLET (100 MG TOTAL) BY MOUTH 3 (THREE) TIMES DAILY. 270 tablet 0  . Misc. Devices MISC Blood pressure monitor.  Diagnosis- hypertension 1 each 0  . torsemide (DEMADEX) 20 MG tablet Take 2 tablets (40 mg total) by mouth 2 (two) times daily. 120 tablet 3  . atorvastatin (LIPITOR) 20 MG tablet TAKE 1 TABLET (20 MG TOTAL) BY MOUTH DAILY. 30 tablet 0  . lisinopril (  ZESTRIL) 2.5 MG tablet TAKE 1 TABLET (2.5 MG TOTAL) BY MOUTH DAILY. 30 tablet 0  . metFORMIN (GLUCOPHAGE) 500 MG tablet Take 1 tablet (500 mg total) by mouth 2 (two) times daily with a meal. 180 tablet 1  . metoprolol tartrate (LOPRESSOR) 50 MG tablet Take 1 tablet (50 mg total) by mouth 2 (two) times daily. 180 tablet 1  . rivaroxaban (XARELTO) 20 MG TABS tablet TAKE 1 TABLET BY MOUTH DAILY WITH SUPPER. 90 tablet 1  . amLODipine (NORVASC) 5 MG tablet TAKE 1 TABLET (5 MG TOTAL) BY MOUTH DAILY. 30 tablet 6  . gabapentin (NEURONTIN) 300 MG capsule TAKE 1 CAPSULE BY MOUTH 2 TIMES DAILY. (Patient not taking: No sig reported) 60 capsule 3    No facility-administered medications prior to visit.     ROS Review of Systems  Constitutional: Positive for fatigue. Negative for activity change and appetite change.  HENT: Negative for sinus pressure and sore throat.   Eyes: Negative for visual disturbance.  Respiratory: Positive for cough and shortness of breath. Negative for chest tightness.   Cardiovascular: Positive for leg swelling. Negative for chest pain and palpitations.  Gastrointestinal: Negative for abdominal distention, abdominal pain, constipation and diarrhea.  Endocrine: Negative.   Genitourinary: Negative for dysuria.  Musculoskeletal: Negative for joint swelling and myalgias.  Skin: Negative for rash.  Allergic/Immunologic: Negative.   Neurological: Negative for weakness, light-headedness and numbness.  Psychiatric/Behavioral: Negative for dysphoric mood and suicidal ideas.    Objective:  BP 139/81   Pulse (!) 55   Ht 6' 1"  (1.854 m)   Wt (!) 306 lb (138.8 kg)   SpO2 97%   BMI 40.37 kg/m   BP/Weight 11/10/2020 05/13/2020 01/04/9562  Systolic BP 875 643 329  Diastolic BP 81 69 90  Wt. (Lbs) 306 255 266  BMI 40.37 33.64 35.09      Physical Exam Constitutional:      Appearance: He is well-developed. He is obese.     Comments: Dyspneic at rest  Neck:     Vascular: No JVD.  Cardiovascular:     Rate and Rhythm: Bradycardia present.     Heart sounds: Normal heart sounds. No murmur heard.   Pulmonary:     Effort: Pulmonary effort is normal.     Breath sounds: Rales present. No wheezing.  Chest:     Chest wall: No tenderness.  Abdominal:     General: Bowel sounds are normal. There is no distension.     Palpations: There is no mass.     Tenderness: There is abdominal tenderness.     Comments: Peau d'orange appearance of lower abdomen  Musculoskeletal:        General: Normal range of motion.     Right lower leg: Edema (2+ pitting) present.     Left lower leg: Edema (2+ pitting) present.  Skin:     General: Skin is warm.  Neurological:     Mental Status: He is alert and oriented to person, place, and time.  Psychiatric:        Mood and Affect: Mood normal.     CMP Latest Ref Rng & Units 05/13/2020 12/31/2019 12/13/2018  Glucose 65 - 99 mg/dL 114(H) 113(H) 105(H)  BUN 8 - 27 mg/dL 28(H) 15 21  Creatinine 0.76 - 1.27 mg/dL 1.20 1.07 0.99  Sodium 134 - 144 mmol/L 140 143 139  Potassium 3.5 - 5.2 mmol/L 4.1 4.4 4.5  Chloride 96 - 106 mmol/L 101 102 100  CO2 20 -  29 mmol/L 24 27 23   Calcium 8.6 - 10.2 mg/dL 9.7 9.5 9.5  Total Protein 6.0 - 8.5 g/dL 8.1 7.6 7.4  Total Bilirubin 0.0 - 1.2 mg/dL 1.2 1.6(H) 1.1  Alkaline Phos 48 - 121 IU/L 145(H) 188(H) 201(H)  AST 0 - 40 IU/L 11 15 26   ALT 0 - 44 IU/L 7 6 31     Lipid Panel     Component Value Date/Time   CHOL 120 12/31/2019 1520   TRIG 61 12/31/2019 1520   HDL 33 (L) 12/31/2019 1520   CHOLHDL 3.6 12/31/2019 1520   CHOLHDL 2.3 10/03/2017 1457   VLDL 14 10/03/2017 1457   LDLCALC 74 12/31/2019 1520    CBC    Component Value Date/Time   WBC 9.0 12/08/2017 1518   RBC 3.52 (L) 12/08/2017 1518   HGB 11.1 (L) 12/08/2017 1518   HGB 9.9 (L) 03/16/2017 1644   HCT 34.6 (L) 12/08/2017 1518   HCT 30.1 (L) 03/16/2017 1644   PLT 211 12/08/2017 1518   PLT 222 03/16/2017 1644   MCV 98.3 12/08/2017 1518   MCV 97 03/16/2017 1644   MCH 31.5 12/08/2017 1518   MCHC 32.1 12/08/2017 1518   RDW 14.3 12/08/2017 1518   RDW 13.8 03/16/2017 1644   LYMPHSABS 0.4 (L) 03/31/2017 0330   MONOABS 0.8 03/31/2017 0330   EOSABS 0.1 03/31/2017 0330   BASOSABS 0.0 03/31/2017 0330    Lab Results  Component Value Date   HGBA1C 5.3 11/10/2020   Assessment & Plan:  1. Acute on chronic combined systolic and diastolic congestive heart failure (HCC) Currently in acute exacerbation of CHF evidenced by dyspnea at rest with anasarca, weight gain of 56 pounds in the last 6 months. He is currently at high risk of deterioration and decompensation. Referred to  the ED STAT as he will need IV diuresis. Previous EF was 55 to 60% Placed on Farxiga for diabetes management which will also help with his CHF. He endorses compliance with torsemide but compliance with fluid restriction and cardiac diet cannot be ascertained. - dapagliflozin propanediol (FARXIGA) 5 MG TABS tablet; Take 1 tablet (5 mg total) by mouth daily before breakfast.  Dispense: 30 tablet; Refill: 6  2. Type 2 diabetes mellitus with complication, without long-term current use of insulin (HCC) Controlled with A1c of 5.3 Discontinued Metformin due to acute CHF exacerbation SGLT2 will be beneficial and he has been commenced on Farxiga Counseled on Diabetic diet, my plate method, 830 minutes of moderate intensity exercise/week Blood sugar logs with fasting goals of 80-120 mg/dl, random of less than 180 and in the event of sugars less than 60 mg/dl or greater than 400 mg/dl encouraged to notify the clinic. Advised on the need for annual eye exams, annual foot exams, Pneumonia vaccine. - POCT glycosylated hemoglobin (Hb A1C) - POCT glucose (manual entry) - dapagliflozin propanediol (FARXIGA) 5 MG TABS tablet; Take 1 tablet (5 mg total) by mouth daily before breakfast.  Dispense: 30 tablet; Refill: 6 - atorvastatin (LIPITOR) 20 MG tablet; Take 1 tablet (20 mg total) by mouth daily.  Dispense: 30 tablet; Refill: 6  3. Paroxysmal atrial fibrillation (HCC) Currently in sinus rhythm On anticoagulation with Xarelto and rate control with metoprolol - rivaroxaban (XARELTO) 20 MG TABS tablet; TAKE 1 TABLET BY MOUTH DAILY WITH SUPPER.  Dispense: 90 tablet; Refill: 1  4. Hypertensive heart disease with chronic combined systolic and diastolic congestive heart failure (HCC) Currently in acute exacerbation See #1 above BP Slightly above goal No  regimen change at this time Counseled on blood pressure goal of less than 130/80, low-sodium, DASH diet, medication compliance, 150 minutes of moderate intensity  exercise per week. Discussed medication compliance, adverse effects. - metoprolol tartrate (LOPRESSOR) 50 MG tablet; Take 1 tablet (50 mg total) by mouth 2 (two) times daily.  Dispense: 180 tablet; Refill: 1  - lisinopril (ZESTRIL) 2.5 MG tablet; Take 1 tablet (2.5 mg total) by mouth daily.  Dispense: 30 tablet; Refill: 6  5. Gastroesophageal reflux disease without esophagitis Commenced on PPI Avoid late meals and foods that trigger symptoms - pantoprazole (PROTONIX) 40 MG tablet; Take 1 tablet (40 mg total) by mouth daily.  Dispense: 30 tablet; Refill: 3   Meds ordered this encounter  Medications  . dapagliflozin propanediol (FARXIGA) 5 MG TABS tablet    Sig: Take 1 tablet (5 mg total) by mouth daily before breakfast.    Dispense:  30 tablet    Refill:  6    Discontinue Metformin  . atorvastatin (LIPITOR) 20 MG tablet    Sig: Take 1 tablet (20 mg total) by mouth daily.    Dispense:  30 tablet    Refill:  6  . lisinopril (ZESTRIL) 2.5 MG tablet    Sig: Take 1 tablet (2.5 mg total) by mouth daily.    Dispense:  30 tablet    Refill:  6  . metoprolol tartrate (LOPRESSOR) 50 MG tablet    Sig: Take 1 tablet (50 mg total) by mouth 2 (two) times daily.    Dispense:  180 tablet    Refill:  1  . rivaroxaban (XARELTO) 20 MG TABS tablet    Sig: TAKE 1 TABLET BY MOUTH DAILY WITH SUPPER.    Dispense:  90 tablet    Refill:  1  . pantoprazole (PROTONIX) 40 MG tablet    Sig: Take 1 tablet (40 mg total) by mouth daily.    Dispense:  30 tablet    Refill:  3    Follow-up: Return for Follow-up patient will be given appointment on discharge from hospitalization.Charlott Rakes, MD, FAAFP. [facility] [place], [place]   11/10/2020, 4:13 PM

## 2020-11-10 NOTE — Patient Instructions (Signed)
Please go to the ED right away as you are having an acute exacerbation of your Congestive Heart Failure and will need intravenous Lasix.

## 2020-11-10 NOTE — H&P (Signed)
History and Physical    Paul Mclaughlin KDX:[id number] DOB: 1955-09-16 DOA: 11/10/2020  PCP: Charlott Rakes, MD  Patient coming from: Home   Chief Complaint:  Chief Complaint  Patient presents with  . Shortness of Breath  . Congestive Heart Failure     HPI:    65 year old male with past medical history of diabetes mellitus type 2, hypertension, chronic atrial fibrillation (on Xarelto), pulmonary hypertension, diastolic congestive heart failure (Echo 2018 EF 55-60%), peripheral vascular disease and gastroesophageal reflux disease who presents to [facility] emergency department with complaints of shortness of breath and cough.  Patient explains that for approximately past 6 months he has been developing gradually worsening bilateral lower extremity swelling.  Patient endorses an approximate 80 pound weight gain over the span of time.  Patient explains that approximately 10 days ago he began to develop shortness of breath.  Shortness of breath is worse with exertion and improved with rest.  Patient denies any associated chest pain.  Patient does also endorse associated cough that is nonproductive.  Patient denies any fevers, sick contacts, recent travel or confirmed contact with COVID-19 infection.  Patient symptoms of progressively worsening peripheral edema shortness of breath and cough continued to persist until the patient eventually presented to [facility] emergency department for evaluation.  Upon evaluation in the emergency department, patient was clinically felt to be suffering from acute congestive heart failure.  BNP was found to be elevated at 693.  Troponin was found to be slightly elevated at 21.  Chest x-ray revealed evidence of cardiomegaly and vascular congestion.  Patient was found to be Covid positive.  Patient was administered 80 mg of IV Lasix.  The hospitalist group was then called to assess the patient for admission to the hospital.  Review of Systems:    Review of Systems  Respiratory: Positive for cough and shortness of breath.   Cardiovascular: Positive for leg swelling.  All other systems reviewed and are negative.   Past Medical History:  Diagnosis Date  . Atrial fibrillation ([place])   . Cellulitis and abscess of left leg 06/2016  . CHF (congestive heart failure) ([place])   . Diabetes ([place])   . Dyspnea     Past Surgical History:  Procedure Laterality Date  . AMPUTATION Left 06/24/2016   Procedure: FOOT FIFTH RAY TOE AMPUTATION;  Surgeon: Newt Minion, MD;  Location: [place];  Service: Orthopedics;  Laterality: Left;  . open heart surgery     As a child.  58 years old.  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   [name] RIGHT/LEFT HEART CATH AND CORONARY ANGIOGRAPHY N/A 03/30/2017   Procedure: Right/Left Heart Cath and Coronary Angiography;  Surgeon: Larey Dresser, MD;  Location: [place] CV LAB;  Service: Cardiovascular;  Laterality: N/A;     reports that he has never smoked. He has never used smokeless tobacco. He reports that he does not drink alcohol and does not use drugs.  No Known Allergies  Family History  Problem Relation Age of Onset  . Diabetes Mellitus II Mother        ESRD  . Diabetes Mellitus II Brother   . Emphysema Father      Prior to Admission medications   Medication Sig Start Date End Date Taking? Authorizing Provider  Accu-Chek Softclix Lancets lancets Use to check blood sugar daily. 07/27/20   Charlott Rakes, MD  albuterol (PROVENTIL HFA;VENTOLIN HFA) 108 (90 Base) MCG/ACT inhaler Inhale 2 puffs into the lungs every 6 (six) hours as  needed for wheezing or shortness of breath. 09/07/16   Charlott Rakes, MD  amLODipine (NORVASC) 5 MG tablet TAKE 1 TABLET (5 MG TOTAL) BY MOUTH DAILY. 07/28/20 10/26/20  Charlott Rakes, MD  atorvastatin (LIPITOR) 20 MG tablet Take 1 tablet (20 mg total) by mouth daily. 11/10/20   Charlott Rakes, MD  Blood Glucose Monitoring Suppl (ACCU-CHEK GUIDE ME) w/Device KIT Use to check blood sugar daily.  07/27/20   Charlott Rakes, MD  cetirizine (ZYRTEC) 10 MG tablet Take 1 tablet (10 mg total) by mouth daily. 11/08/17   Charlott Rakes, MD  dapagliflozin propanediol (FARXIGA) 5 MG TABS tablet Take 1 tablet (5 mg total) by mouth daily before breakfast. 11/10/20   Charlott Rakes, MD  gabapentin (NEURONTIN) 300 MG capsule TAKE 1 CAPSULE BY MOUTH 2 TIMES DAILY. Patient not taking: No sig reported 02/28/18   Charlott Rakes, MD  glucose blood (ACCU-CHEK GUIDE) test strip Use to check blood sugar daily. 07/27/20   Charlott Rakes, MD  hydrALAZINE (APRESOLINE) 100 MG tablet TAKE 1 TABLET (100 MG TOTAL) BY MOUTH 3 (THREE) TIMES DAILY. 10/05/20   Charlott Rakes, MD  lisinopril (ZESTRIL) 2.5 MG tablet Take 1 tablet (2.5 mg total) by mouth daily. 11/10/20   Charlott Rakes, MD  metoprolol tartrate (LOPRESSOR) 50 MG tablet Take 1 tablet (50 mg total) by mouth 2 (two) times daily. 11/10/20   Charlott Rakes, MD  Misc. Devices MISC Blood pressure monitor.  Diagnosis- hypertension 12/31/19   Charlott Rakes, MD  pantoprazole (PROTONIX) 40 MG tablet Take 1 tablet (40 mg total) by mouth daily. 11/10/20   Charlott Rakes, MD  rivaroxaban (XARELTO) 20 MG TABS tablet TAKE 1 TABLET BY MOUTH DAILY WITH SUPPER. 11/10/20   Charlott Rakes, MD  torsemide (DEMADEX) 20 MG tablet Take 2 tablets (40 mg total) by mouth 2 (two) times daily. 05/13/20   Charlott Rakes, MD    Physical Exam: Vitals:   11/11/20 0630 11/11/20 0638 11/11/20 0639 11/11/20 0639  BP: 126/69 (!) 148/74    Pulse: (!) 47 (!) 55 (!) 48   Resp:   14   Temp:    (!) 97.5 F (36.4 C)  TempSrc:    Oral  SpO2: 99% 100% 100%     Constitutional: Acute alert and oriented x3, no associated distress.   Skin: no rashes, no lesions, good skin turgor noted. Eyes: Pupils are equally reactive to light.  No evidence of scleral icterus or conjunctival pallor.  ENMT: Moist mucous membranes noted.  Posterior pharynx clear of any exudate or lesions.  Neck: normal, supple, no  masses, no thyromegaly.  No evidence of jugular venous distension.   Respiratory: Markedly diminished breath sounds at the bases with associated bibasilar rales.  No evidence of wheezing noted.   Normal respiratory effort. No accessory muscle use.  Cardiovascular: Irregularly irregular rhythm.  No murmurs / rubs / gallops.  Significant bilateral lower extremity pitting edema that tracks in the distal bilateral lower extremities up to the lower abdominal wall.. 2+ pedal pulses. No carotid bruits.  Chest:   Nontender without crepitus or deformity.   Back:   Nontender without crepitus or deformity. Abdomen: Abdomen is soft and nontender.  No evidence of intra-abdominal masses.  Positive bowel sounds noted in all quadrants.   Musculoskeletal: No joint deformity upper and lower extremities. Good ROM, no contractures. Normal muscle tone.  Neurologic: CN 2-12 grossly intact. Sensation intact.  Patient moving all 4 extremities spontaneously.  Patient is following all commands.  Patient is responsive  to verbal stimuli.   Psychiatric: Patient exhibits normal mood with appropriate affect.  Patient seems to possess insight as to their current situation.     Labs on Admission: I have personally reviewed following labs and imaging studies -   CBC: Recent Labs  Lab 11/10/20 1722 11/10/20 1946 11/11/20 0404  WBC 3.3*  --  2.9*  NEUTROABS  --  2.4 2.1  HGB 9.4*  --  9.6*  HCT 28.7*  --  30.7*  MCV 96.6  --  98.7  PLT 252  --  237   Basic Metabolic Panel: Recent Labs  Lab 11/10/20 1722 11/11/20 0404  NA 137 136  K 3.8 3.6  CL 101 100  CO2 26 26  GLUCOSE 119* 163*  BUN 21 20  CREATININE 1.47* 1.43*  CALCIUM 8.7* 8.7*  MG  --  1.9   GFR: Estimated Creatinine Clearance: 77.4 mL/min (A) (by C-G formula based on SCr of 1.43 mg/dL (H)). Liver Function Tests: Recent Labs  Lab 11/10/20 1946 11/11/20 0404  AST 20 18  ALT 11 11  ALKPHOS 113 98  BILITOT 1.7* 1.6*  PROT 7.1 6.8  ALBUMIN 3.3*  3.1*   No results for input(s): LIPASE, AMYLASE in the last 168 hours. No results for input(s): AMMONIA in the last 168 hours. Coagulation Profile: No results for input(s): INR, PROTIME in the last 168 hours. Cardiac Enzymes: No results for input(s): CKTOTAL, CKMB, CKMBINDEX, TROPONINI in the last 168 hours. BNP (last 3 results) No results for input(s): PROBNP in the last 8760 hours. HbA1C: Recent Labs    11/10/20 1610  HGBA1C 5.3   CBG: Recent Labs  Lab 11/11/20 0734  GLUCAP 120*   Lipid Profile: No results for input(s): CHOL, HDL, LDLCALC, TRIG, CHOLHDL, LDLDIRECT in the last 72 hours. Thyroid Function Tests: No results for input(s): TSH, T4TOTAL, FREET4, T3FREE, THYROIDAB in the last 72 hours. Anemia Panel: No results for input(s): VITAMINB12, FOLATE, FERRITIN, TIBC, IRON, RETICCTPCT in the last 72 hours. Urine analysis:    Component Value Date/Time   COLORURINE YELLOW 11/11/2020 [place] 11/11/2020 0554   LABSPEC 1.006 11/11/2020 0554   PHURINE 6.0 11/11/2020 0554   GLUCOSEU NEGATIVE 11/11/2020 0554   HGBUR NEGATIVE 11/11/2020 0554   BILIRUBINUR NEGATIVE 11/11/2020 0554   BILIRUBINUR small 11/10/2016 1212   KETONESUR NEGATIVE 11/11/2020 0554   PROTEINUR NEGATIVE 11/11/2020 0554   UROBILINOGEN negative 11/10/2016 1212   NITRITE NEGATIVE 11/11/2020 0554   LEUKOCYTESUR NEGATIVE 11/11/2020 0554    Radiological Exams on Admission - Personally Reviewed: DG Chest 2 View  Result Date: 11/10/2020 CLINICAL DATA:  Short of breath EXAM: CHEST - 2 VIEW COMPARISON:  03/30/2017 FINDINGS: Mild cardiomegaly with central vascular congestion. No overt edema or pleural effusion. No consolidation or pneumothorax. Old fracture deformity of the left clavicle. IMPRESSION: Cardiomegaly with mild central vascular congestion. Electronically Signed   By: Donavan Foil M.D.   On: 11/10/2020 17:48   US Abdomen Limited RUQ (LIVER/GB)  Result Date: 11/11/2020 CLINICAL DATA:   Cirrhosis.  Ascites.  Abdominal distention. EXAM: ULTRASOUND ABDOMEN LIMITED RIGHT UPPER QUADRANT COMPARISON:  No prior. FINDINGS: Gallbladder: No gallstones or wall thickening visualized. No sonographic Murphy sign noted by sonographer. Common bile duct: Diameter: 5.4 mm Liver: Increased echogenicity and nodular irregular cortex consistent with cirrhosis. No focal hepatic abnormality identified. Portal vein is patent on color Doppler imaging with normal direction of blood flow towards the liver. Other: Prominent ascites. IMPRESSION: 1. No gallstones or biliary  distention. 2. Increased hepatic echogenicity and nodular hepatic cortex consistent with cirrhosis. No focal hepatic abnormality identified. 3. Prominent ascites. Electronically Signed   By: Marcello Moores  Register   On: 11/11/2020 06:58    EKG: Personally reviewed.  Rhythm is atrial fibrillation with heart rate of 49 bpm.  Evidence of right bundle branch block.  No dynamic ST segment changes appreciated.  Assessment/Plan Principal Problem: Shortness of breath multifactorial secondary to acute on chronic diastolic congestive heart failure with superimposed COVID-19   Patient endorses a several month history of progressive dyspnea on exertion, peripheral edema and weight gain  However in the past 10 days patient has become particularly severe, likely secondary to the recent development of COVID-19  Patient has been initiated on a regimen of intravenous diuretics as patient does show evidence of clinical volume overload  Additionally, patient is also COVID-19 PCR positive and is exhibiting evidence of mild hypoxia in the emergency department requiring the administration of supplemental oxygen.  For this we are initiating intravenous remdesivir and Solu-Medrol  Continue to provide supplemental oxygen for bouts of hypoxia with target saturations of greater than 94%.  Strict input and output monitoring  Monitoring renal function and electrolytes  with serial chemistries.  Obtaining echocardiogram  Obtaining right upper quadrant ultrasound and urinalysis to evaluate patient for other potential causes of peripheral edema such as cirrhosis or nephrotic syndrome    Atrial fibrillation, chronic (HCC)   Continuing home regimen Xarelto for anticoagulation  Monitoring patient on telemetry  Patient is exhibiting substantial bradycardia and therefore will hold home regimen of metoprolol for now.    Essential hypertension   Resume home regimen of antihypertensive therapy.    Type 2 diabetes mellitus with stage 3a chronic kidney disease, without long-term current use of insulin (HCC)   Accu-Cheks before every meal and nightly with sliding scale insulin  Will obtain hemoglobin A1c  Holding home regimen of Farxiga    Chronic kidney disease, stage 3a ([place])  . Strict intake and output monitoring . Creatinine near baseline . Minimizing nephrotoxic agents as much as possible . Serial chemistries to monitor renal function and electrolytes    Class 3 severe obesity due to excess calories with serious comorbidity and body mass index (BMI) of 40.0 to 44.9 in adult [facility])   Once patient is clinically improved will counsel on caloric restriction and regular physical activity    GERD without esophagitis   Daily proton pump inhibitor.  Code Status:  Full code Family Communication: deferred   Status is: Observation  The patient remains OBS appropriate and will d/c before 2 midnights.  Dispo: The patient is from: Home              Anticipated d/c is to: Home              Patient currently is not medically stable to d/c.   Difficult to place patient No        Vernelle Emerald MD [facility] Pager [phone]  If 7PM-7AM, please contact night-coverage [email or web address] Use universal [place] password for that web site. If you do not have the password, please call the hospital operator.  11/11/2020, 8:28 AM

## 2020-11-10 NOTE — Progress Notes (Signed)
States that he is out of breath and weak. No Metformin in 3 weeks.

## 2020-11-10 NOTE — ED Triage Notes (Signed)
Patient coming from [facility]. Sent for CHF exacerbation and IV Lasix, patient endorses shortness of breath for several weeks. VSS.

## 2020-11-10 NOTE — ED Provider Notes (Signed)
[facility] Provider Note   CSN: [id number] Arrival date & time: 11/10/20  1650     History Chief Complaint  Patient presents with  . Shortness of Breath  . Congestive Heart Failure    Paul Mclaughlin is a 65 y.o. male.  HPI     65 year old male with a history of type 2 diabetes, hypertension, atrial fibrillation on Xarelto and metoprolol, history of congenital heart disease status post surgery, pulmonary hypertension, congestive heart failure with an EF of 55 to 60% in 2018, peripheral arterial disease, who presents with concern for shortness of breath.  He was sent from urgent care for same.  Has been having dyspnea on exertion worsening over the last 10 days.  He reports bilateral edema as well as cough.  He has had orthopnea.  Denies chest pain.  Has been taking torsemide.  Reports he has gained 56 pounds in the last 6 months.  Denies chest pain but did report an episode of heartburn few days ago.  Has been out of his Metformin for 3 weeks.  Haven't seen cardiologist in a while. Noone has been managing CHF. Has been taking regular torsemide. Has been watching fluid intake. NO fever, no other symptoms.  Past Medical History:  Diagnosis Date  . Atrial fibrillation ([place])   . Cellulitis and abscess of left leg 06/2016  . CHF (congestive heart failure) ([place])   . Diabetes ([place])   . Dyspnea     Patient Active Problem List   Diagnosis Date Noted  . COVID-19 virus infection 11/11/2020  . Acute on chronic diastolic (congestive) heart failure ([place]) 11/11/2020  . Acute on chronic diastolic CHF (congestive heart failure) ([place]) 11/10/2020  . GERD without esophagitis 11/10/2020  . Achilles tendon contracture, right 01/30/2018  . Idiopathic chronic venous hypertension of both lower extremities with inflammation 01/30/2018  . Peripheral arterial disease ([place]) 05/29/2017  . Chronic kidney disease, stage 3a ([place]) 05/24/2017  . Hypertensive heart  disease 03/30/2017  . Class 3 severe obesity due to excess calories with serious comorbidity and body mass index (BMI) of 40.0 to 44.9 in adult ([place]) 01/09/2017  . Pulmonary HTN ([place]) 12/11/2016  . Type 2 diabetes mellitus with stage 3a chronic kidney disease, without long-term current use of insulin ([place]) 12/11/2016  . Diastolic CHF ([place]) 31/54/0086  . Insomnia 11/24/2016  . Foot amputation status, left 08/22/2016  . Atrial fibrillation, chronic ([place]) 06/30/2016  . Essential hypertension 06/30/2016  . Cellulitis of left foot 06/22/2016  . Controlled type 2 diabetes mellitus with hyperglycemia ([place]) 06/22/2016    Past Surgical History:  Procedure Laterality Date  . AMPUTATION Left 06/24/2016   Procedure: FOOT FIFTH RAY TOE AMPUTATION;  Surgeon: Newt Minion, MD;  Location: [facility];  Service: Orthopedics;  Laterality: Left;  . open heart surgery     As a child.  85 years old.  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   [name] RIGHT/LEFT HEART CATH AND CORONARY ANGIOGRAPHY N/A 03/30/2017   Procedure: Right/Left Heart Cath and Coronary Angiography;  Surgeon: Larey Dresser, MD;  Location: [facility];  Service: Cardiovascular;  Laterality: N/A;       Family History  Problem Relation Age of Onset  . Diabetes Mellitus II Mother        ESRD  . Diabetes Mellitus II Brother   . Emphysema Father     Social History   Tobacco Use  . Smoking status: Never Smoker  . Smokeless tobacco: Never Used  Vaping Use  . Vaping Use: Never used  Substance Use Topics  . Alcohol use: No    Comment: gave up drinking ~20 years ago  . Drug use: No    Types: Marijuana    Comment: not since the 1st of January    Home Medications Prior to Admission medications   Medication Sig Start Date End Date Taking? Authorizing Provider  Accu-Chek Softclix Lancets lancets Use to check blood sugar daily. 07/27/20   Charlott Rakes, MD  albuterol (PROVENTIL HFA;VENTOLIN HFA) 108 (90 Base) MCG/ACT inhaler Inhale 2 puffs into the lungs  every 6 (six) hours as needed for wheezing or shortness of breath. 09/07/16   Charlott Rakes, MD  amLODipine (NORVASC) 5 MG tablet TAKE 1 TABLET (5 MG TOTAL) BY MOUTH DAILY. 07/28/20 10/26/20  Charlott Rakes, MD  atorvastatin (LIPITOR) 20 MG tablet Take 1 tablet (20 mg total) by mouth daily. 11/10/20   Charlott Rakes, MD  Blood Glucose Monitoring Suppl (ACCU-CHEK GUIDE ME) w/Device KIT Use to check blood sugar daily. 07/27/20   Charlott Rakes, MD  cetirizine (ZYRTEC) 10 MG tablet Take 1 tablet (10 mg total) by mouth daily. 11/08/17   Charlott Rakes, MD  dapagliflozin propanediol (FARXIGA) 5 MG TABS tablet Take 1 tablet (5 mg total) by mouth daily before breakfast. 11/10/20   Charlott Rakes, MD  gabapentin (NEURONTIN) 300 MG capsule TAKE 1 CAPSULE BY MOUTH 2 TIMES DAILY. Patient not taking: No sig reported 02/28/18   Charlott Rakes, MD  glucose blood (ACCU-CHEK GUIDE) test strip Use to check blood sugar daily. 07/27/20   Charlott Rakes, MD  hydrALAZINE (APRESOLINE) 100 MG tablet TAKE 1 TABLET (100 MG TOTAL) BY MOUTH 3 (THREE) TIMES DAILY. 10/05/20   Charlott Rakes, MD  lisinopril (ZESTRIL) 2.5 MG tablet Take 1 tablet (2.5 mg total) by mouth daily. 11/10/20   Charlott Rakes, MD  metoprolol tartrate (LOPRESSOR) 50 MG tablet Take 1 tablet (50 mg total) by mouth 2 (two) times daily. 11/10/20   Charlott Rakes, MD  Misc. Devices MISC Blood pressure monitor.  Diagnosis- hypertension 12/31/19   Charlott Rakes, MD  pantoprazole (PROTONIX) 40 MG tablet Take 1 tablet (40 mg total) by mouth daily. 11/10/20   Charlott Rakes, MD  rivaroxaban (XARELTO) 20 MG TABS tablet TAKE 1 TABLET BY MOUTH DAILY WITH SUPPER. 11/10/20   Charlott Rakes, MD  torsemide (DEMADEX) 20 MG tablet Take 2 tablets (40 mg total) by mouth 2 (two) times daily. 05/13/20   Charlott Rakes, MD    Allergies    Patient has no known allergies.  Review of Systems   Review of Systems  Constitutional: Positive for fatigue and unexpected weight change.  Negative for appetite change, diaphoresis and fever.  HENT: Negative for sore throat. Rhinorrhea: every now and then "forever"   Eyes: Negative for visual disturbance.  Respiratory: Positive for cough and shortness of breath.   Cardiovascular: Positive for leg swelling. Negative for chest pain (in January had episode of heart burn but not) and palpitations.  Gastrointestinal: Negative for abdominal pain, diarrhea, nausea and vomiting.  Genitourinary: Negative for dysuria.  Musculoskeletal: Negative for back pain.  Skin: Negative for rash.  Neurological: Positive for light-headedness (going from sitting to standing). Negative for headaches.    Physical Exam Updated Vital Signs BP 119/78   Pulse 78   Temp (!) 97.5 F (36.4 C) (Oral)   Resp 16   SpO2 100%   Physical Exam Vitals and nursing note reviewed.  Constitutional:  General: He is not in acute distress.    Appearance: He is well-developed and well-nourished. He is not diaphoretic.  HENT:     Head: Normocephalic and atraumatic.  Eyes:     Extraocular Movements: EOM normal.     Conjunctiva/sclera: Conjunctivae normal.  Neck:     Vascular: JVD present.  Cardiovascular:     Rate and Rhythm: Normal rate and regular rhythm.     Pulses: Intact distal pulses.     Heart sounds: Normal heart sounds. No murmur heard. No friction rub. No gallop.   Pulmonary:     Effort: Pulmonary effort is normal. No respiratory distress.     Breath sounds: Normal breath sounds. No wheezing or rales.  Abdominal:     General: There is no distension (protruberant ).     Palpations: Abdomen is soft.     Tenderness: There is no abdominal tenderness. There is no guarding.  Musculoskeletal:     Cervical back: Normal range of motion.     Right lower leg: Edema present.     Left lower leg: Edema present.  Skin:    General: Skin is warm and dry.  Neurological:     Mental Status: He is alert and oriented to person, place, and time.     ED  Results / Procedures / Treatments   Labs (all labs ordered are listed, but only abnormal results are displayed) Labs Reviewed  RESP PANEL BY RT-PCR (FLU A&B, COVID) ARPGX2 - Abnormal; Notable for the following components:      Result Value   SARS Coronavirus 2 by RT PCR POSITIVE (*)    All other components within normal limits  BASIC METABOLIC PANEL - Abnormal; Notable for the following components:   Glucose, Bld 119 (*)    Creatinine, Ser 1.47 (*)    Calcium 8.7 (*)    GFR, Estimated 53 (*)    All other components within normal limits  CBC - Abnormal; Notable for the following components:   WBC 3.3 (*)    RBC 2.97 (*)    Hemoglobin 9.4 (*)    HCT 28.7 (*)    All other components within normal limits  HEPATIC FUNCTION PANEL - Abnormal; Notable for the following components:   Albumin 3.3 (*)    Total Bilirubin 1.7 (*)    Bilirubin, Direct 0.5 (*)    Indirect Bilirubin 1.2 (*)    All other components within normal limits  DIFFERENTIAL - Abnormal; Notable for the following components:   Lymphs Abs 0.4 (*)    All other components within normal limits  BRAIN NATRIURETIC PEPTIDE - Abnormal; Notable for the following components:   B Natriuretic Peptide 693.0 (*)    All other components within normal limits  CBC WITH DIFFERENTIAL/PLATELET - Abnormal; Notable for the following components:   WBC 2.9 (*)    RBC 3.11 (*)    Hemoglobin 9.6 (*)    HCT 30.7 (*)    Lymphs Abs 0.4 (*)    All other components within normal limits  COMPREHENSIVE METABOLIC PANEL - Abnormal; Notable for the following components:   Glucose, Bld 163 (*)    Creatinine, Ser 1.43 (*)    Calcium 8.7 (*)    Albumin 3.1 (*)    Total Bilirubin 1.6 (*)    GFR, Estimated 55 (*)    All other components within normal limits  D-DIMER, QUANTITATIVE - Abnormal; Notable for the following components:   D-Dimer, Quant 8.35 (*)    All other  components within normal limits  RETICULOCYTES - Abnormal; Notable for the following  components:   RBC. 2.95 (*)    All other components within normal limits  CBG MONITORING, ED - Abnormal; Notable for the following components:   Glucose-Capillary 120 (*)    All other components within normal limits  TROPONIN I (HIGH SENSITIVITY) - Abnormal; Notable for the following components:   Troponin I (High Sensitivity) 21 (*)    All other components within normal limits  TROPONIN I (HIGH SENSITIVITY) - Abnormal; Notable for the following components:   Troponin I (High Sensitivity) 21 (*)    All other components within normal limits  MAGNESIUM  URINALYSIS, COMPLETE (UACMP) WITH MICROSCOPIC  C-REACTIVE PROTEIN  VITAMIN B12  IRON AND TIBC  FERRITIN  FOLATE  TSH  TYPE AND SCREEN  ABO/RH    EKG EKG Interpretation  Date/Time:  Tuesday November 10 2020 20:28:26 EST Ventricular Rate:  49 PR Interval:    QRS Duration: 135 QT Interval:  514 QTC Calculation: 464 R Axis:   -145 Text Interpretation: Atrial fibrillation Right bundle branch block Since prior ECG< TW abnormalities anterior leads Confirmed by Gareth Morgan [id number]) on 11/11/2020 9:16:02 AM   Radiology DG Chest 2 View  Result Date: 11/10/2020 CLINICAL DATA:  Short of breath EXAM: CHEST - 2 VIEW COMPARISON:  03/30/2017 FINDINGS: Mild cardiomegaly with central vascular congestion. No overt edema or pleural effusion. No consolidation or pneumothorax. Old fracture deformity of the left clavicle. IMPRESSION: Cardiomegaly with mild central vascular congestion. Electronically Signed   By: Donavan Foil M.D.   On: 11/10/2020 17:48   US Abdomen Limited RUQ (LIVER/GB)  Result Date: 11/11/2020 CLINICAL DATA:  Cirrhosis.  Ascites.  Abdominal distention. EXAM: ULTRASOUND ABDOMEN LIMITED RIGHT UPPER QUADRANT COMPARISON:  No prior. FINDINGS: Gallbladder: No gallstones or wall thickening visualized. No sonographic Murphy sign noted by sonographer. Common bile duct: Diameter: 5.4 mm Liver: Increased echogenicity and nodular irregular  cortex consistent with cirrhosis. No focal hepatic abnormality identified. Portal vein is patent on color Doppler imaging with normal direction of blood flow towards the liver. Other: Prominent ascites. IMPRESSION: 1. No gallstones or biliary distention. 2. Increased hepatic echogenicity and nodular hepatic cortex consistent with cirrhosis. No focal hepatic abnormality identified. 3. Prominent ascites. Electronically Signed   By: Marcello Moores  Register   On: 11/11/2020 06:58    Procedures Procedures   Medications Ordered in ED Medications  atorvastatin (LIPITOR) tablet 20 mg (20 mg Oral Given 11/11/20 0917)  hydrALAZINE (APRESOLINE) tablet 100 mg (0 mg Oral Hold 11/11/20 0917)  lisinopril (ZESTRIL) tablet 2.5 mg (2.5 mg Oral Given 11/11/20 0917)  pantoprazole (PROTONIX) EC tablet 40 mg (40 mg Oral Given 11/11/20 0918)  rivaroxaban (XARELTO) tablet 20 mg (has no administration in time range)  albuterol (VENTOLIN HFA) 108 (90 Base) MCG/ACT inhaler 2 puff (has no administration in time range)  loratadine (CLARITIN) tablet 10 mg (10 mg Oral Given 11/11/20 0918)  sodium chloride flush (NS) 0.9 % injection 3 mL (3 mLs Intravenous Not Given 11/11/20 0918)  sodium chloride flush (NS) 0.9 % injection 3 mL (has no administration in time range)  0.9 %  sodium chloride infusion (has no administration in time range)  acetaminophen (TYLENOL) tablet 650 mg (has no administration in time range)  ondansetron (ZOFRAN) injection 4 mg (has no administration in time range)  furosemide (LASIX) injection 60 mg (60 mg Intravenous Given 11/11/20 0916)  insulin aspart (novoLOG) injection 0-15 Units (0 Units Subcutaneous Not Given 11/11/20 0747)  albuterol (VENTOLIN HFA) 108 (90 Base) MCG/ACT inhaler 2 puff (2 puffs Inhalation Given 11/11/20 0918)  chlorpheniramine-HYDROcodone (TUSSIONEX) 10-8 MG/5ML suspension 5 mL (has no administration in time range)  ascorbic acid (VITAMIN C) tablet 500 mg (500 mg Oral Given 11/11/20 0917)  zinc sulfate  capsule 220 mg (220 mg Oral Given 11/11/20 0917)  remdesivir 200 mg in sodium chloride 0.9% 250 mL IVPB (0 mg Intravenous Stopped 11/11/20 0726)    Followed by  remdesivir 100 mg in sodium chloride 0.9 % 100 mL IVPB (has no administration in time range)  dapagliflozin propanediol (FARXIGA) tablet 5 mg (5 mg Oral Given 11/11/20 0917)  insulin aspart (novoLOG) injection 0-15 Units (has no administration in time range)  insulin aspart (novoLOG) injection 0-5 Units (has no administration in time range)  insulin aspart (novoLOG) injection 4 Units (has no administration in time range)  amLODipine (NORVASC) tablet 5 mg (5 mg Oral Not Given 11/11/20 0918)  methylPREDNISolone sodium succinate (SOLU-MEDROL) 40 mg/mL injection 40 mg (has no administration in time range)  furosemide (LASIX) injection 80 mg (80 mg Intravenous Given 11/10/20 2309)  aspirin chewable tablet 324 mg (324 mg Oral Given 11/10/20 2309)    ED Course  I have reviewed the triage vital signs and the nursing notes.  Pertinent labs & imaging results that were available during my care of the patient were reviewed by me and considered in my medical decision making (see chart for details).    MDM Rules/Calculators/A&P                           65 year old male with a history of type 2 diabetes, hypertension, atrial fibrillation on Xarelto and metoprolol, history of congenital heart disease status post surgery, pulmonary hypertension, congestive heart failure with an EF of 55 to 60% in 2018, peripheral arterial disease, who presents with concern for shortness of breath.  Clinical exam and history consistent with CHF with weight gain, edema, orthopnea, JVD.  BNP elevated from previous.  Suspect likely worsening of CHF. Given 12m IV lasix.  Will admit for continued care. COVID test returned positive after plan for admission and is likely also contributing to worsening symptoms.   Final Clinical Impression(s) / ED Diagnoses Final diagnoses:   Congestive heart failure, unspecified HF chronicity, [place] heart [place]  COVID[place]  Other ascites    Rx / DC Orders ED Discharge Orders    None       SGareth Morgan MD 11/11/20 [phone]

## 2020-11-11 ENCOUNTER — Observation Stay (HOSPITAL_COMMUNITY): Payer: Medicaid Other

## 2020-11-11 DIAGNOSIS — I509 Heart failure, unspecified: Secondary | ICD-10-CM | POA: Diagnosis not present

## 2020-11-11 DIAGNOSIS — I2510 Atherosclerotic heart disease of native coronary artery without angina pectoris: Secondary | ICD-10-CM | POA: Diagnosis not present

## 2020-11-11 DIAGNOSIS — Z833 Family history of diabetes mellitus: Secondary | ICD-10-CM | POA: Diagnosis not present

## 2020-11-11 DIAGNOSIS — R609 Edema, unspecified: Secondary | ICD-10-CM | POA: Diagnosis not present

## 2020-11-11 DIAGNOSIS — Z6841 Body Mass Index (BMI) 40.0 and over, adult: Secondary | ICD-10-CM | POA: Diagnosis not present

## 2020-11-11 DIAGNOSIS — Z7984 Long term (current) use of oral hypoglycemic drugs: Secondary | ICD-10-CM | POA: Diagnosis not present

## 2020-11-11 DIAGNOSIS — U071 COVID-19: Secondary | ICD-10-CM | POA: Diagnosis present

## 2020-11-11 DIAGNOSIS — R0602 Shortness of breath: Secondary | ICD-10-CM | POA: Diagnosis not present

## 2020-11-11 DIAGNOSIS — R932 Abnormal findings on diagnostic imaging of liver and biliary tract: Secondary | ICD-10-CM | POA: Diagnosis not present

## 2020-11-11 DIAGNOSIS — I50810 Right heart failure, unspecified: Secondary | ICD-10-CM | POA: Diagnosis not present

## 2020-11-11 DIAGNOSIS — R7989 Other specified abnormal findings of blood chemistry: Secondary | ICD-10-CM | POA: Diagnosis not present

## 2020-11-11 DIAGNOSIS — R188 Other ascites: Secondary | ICD-10-CM | POA: Diagnosis not present

## 2020-11-11 DIAGNOSIS — I5033 Acute on chronic diastolic (congestive) heart failure: Secondary | ICD-10-CM | POA: Diagnosis present

## 2020-11-11 DIAGNOSIS — E785 Hyperlipidemia, unspecified: Secondary | ICD-10-CM | POA: Diagnosis not present

## 2020-11-11 DIAGNOSIS — E1151 Type 2 diabetes mellitus with diabetic peripheral angiopathy without gangrene: Secondary | ICD-10-CM | POA: Diagnosis not present

## 2020-11-11 DIAGNOSIS — E1122 Type 2 diabetes mellitus with diabetic chronic kidney disease: Secondary | ICD-10-CM | POA: Diagnosis not present

## 2020-11-11 DIAGNOSIS — I2729 Other secondary pulmonary hypertension: Secondary | ICD-10-CM | POA: Diagnosis not present

## 2020-11-11 DIAGNOSIS — I5082 Biventricular heart failure: Secondary | ICD-10-CM | POA: Diagnosis not present

## 2020-11-11 DIAGNOSIS — K219 Gastro-esophageal reflux disease without esophagitis: Secondary | ICD-10-CM | POA: Diagnosis not present

## 2020-11-11 DIAGNOSIS — R0902 Hypoxemia: Secondary | ICD-10-CM | POA: Diagnosis not present

## 2020-11-11 DIAGNOSIS — I130 Hypertensive heart and chronic kidney disease with heart failure and stage 1 through stage 4 chronic kidney disease, or unspecified chronic kidney disease: Secondary | ICD-10-CM | POA: Diagnosis not present

## 2020-11-11 DIAGNOSIS — I480 Paroxysmal atrial fibrillation: Secondary | ICD-10-CM | POA: Diagnosis not present

## 2020-11-11 DIAGNOSIS — I482 Chronic atrial fibrillation, unspecified: Secondary | ICD-10-CM | POA: Diagnosis not present

## 2020-11-11 DIAGNOSIS — K7460 Unspecified cirrhosis of liver: Secondary | ICD-10-CM | POA: Diagnosis not present

## 2020-11-11 DIAGNOSIS — N1831 Chronic kidney disease, stage 3a: Secondary | ICD-10-CM | POA: Diagnosis not present

## 2020-11-11 DIAGNOSIS — Z7901 Long term (current) use of anticoagulants: Secondary | ICD-10-CM | POA: Diagnosis not present

## 2020-11-11 DIAGNOSIS — I110 Hypertensive heart disease with heart failure: Secondary | ICD-10-CM | POA: Diagnosis not present

## 2020-11-11 DIAGNOSIS — J1282 Pneumonia due to coronavirus disease 2019: Secondary | ICD-10-CM | POA: Diagnosis not present

## 2020-11-11 DIAGNOSIS — Z951 Presence of aortocoronary bypass graft: Secondary | ICD-10-CM | POA: Diagnosis not present

## 2020-11-11 DIAGNOSIS — Z8774 Personal history of (corrected) congenital malformations of heart and circulatory system: Secondary | ICD-10-CM | POA: Diagnosis not present

## 2020-11-11 LAB — URINALYSIS, COMPLETE (UACMP) WITH MICROSCOPIC
Bacteria, UA: NONE SEEN
Bilirubin Urine: NEGATIVE
Glucose, UA: NEGATIVE mg/dL
Hgb urine dipstick: NEGATIVE
Ketones, ur: NEGATIVE mg/dL
Leukocytes,Ua: NEGATIVE
Nitrite: NEGATIVE
Protein, ur: NEGATIVE mg/dL
Specific Gravity, Urine: 1.006 (ref 1.005–1.030)
pH: 6 (ref 5.0–8.0)

## 2020-11-11 LAB — GLUCOSE, CAPILLARY
Glucose-Capillary: 127 mg/dL — ABNORMAL HIGH (ref 70–99)
Glucose-Capillary: 104 mg/dL — ABNORMAL HIGH (ref 70–99)
Glucose-Capillary: 89 mg/dL (ref 70–99)

## 2020-11-11 LAB — COMPREHENSIVE METABOLIC PANEL
ALT: 11 U/L (ref 0–44)
AST: 18 U/L (ref 15–41)
Albumin: 3.1 g/dL — ABNORMAL LOW (ref 3.5–5.0)
Alkaline Phosphatase: 98 U/L (ref 38–126)
Anion gap: 10 (ref 5–15)
BUN: 20 mg/dL (ref 8–23)
CO2: 26 mmol/L (ref 22–32)
Calcium: 8.7 mg/dL — ABNORMAL LOW (ref 8.9–10.3)
Chloride: 100 mmol/L (ref 98–111)
Creatinine, Ser: 1.43 mg/dL — ABNORMAL HIGH (ref 0.61–1.24)
GFR, Estimated: 55 mL/min — ABNORMAL LOW (ref >60)
Glucose, Bld: 163 mg/dL — ABNORMAL HIGH (ref 70–99)
Potassium: 3.6 mmol/L (ref 3.5–5.1)
Sodium: 136 mmol/L (ref 135–145)
Total Bilirubin: 1.6 mg/dL — ABNORMAL HIGH (ref 0.3–1.2)
Total Protein: 6.8 g/dL (ref 6.5–8.1)

## 2020-11-11 LAB — IRON AND TIBC
Iron: 35 ug/dL — ABNORMAL LOW (ref 45–182)
Saturation Ratios: 14 % — ABNORMAL LOW (ref 17.9–39.5)
TIBC: 255 ug/dL (ref 250–450)
UIBC: 220 ug/dL

## 2020-11-11 LAB — CBG MONITORING, ED: Glucose-Capillary: 120 mg/dL — ABNORMAL HIGH (ref 70–99)

## 2020-11-11 LAB — CBC WITH DIFFERENTIAL/PLATELET
Abs Immature Granulocytes: 0.01 10*3/uL (ref 0.00–0.07)
Basophils Absolute: 0 10*3/uL (ref 0.0–0.1)
Basophils Relative: 0 %
Eosinophils Absolute: 0.1 10*3/uL (ref 0.0–0.5)
Eosinophils Relative: 3 %
HCT: 30.7 % — ABNORMAL LOW (ref 39.0–52.0)
Hemoglobin: 9.6 g/dL — ABNORMAL LOW (ref 13.0–17.0)
Immature Granulocytes: 0 %
Lymphocytes Relative: 15 %
Lymphs Abs: 0.4 10*3/uL — ABNORMAL LOW (ref 0.7–4.0)
MCH: 30.9 pg (ref 26.0–34.0)
MCHC: 31.3 g/dL (ref 30.0–36.0)
MCV: 98.7 fL (ref 80.0–100.0)
Monocytes Absolute: 0.3 10*3/uL (ref 0.1–1.0)
Monocytes Relative: 10 %
Neutro Abs: 2.1 10*3/uL (ref 1.7–7.7)
Neutrophils Relative %: 72 %
Platelets: 235 10*3/uL (ref 150–400)
RBC: 3.11 MIL/uL — ABNORMAL LOW (ref 4.22–5.81)
RDW: 15.4 % (ref 11.5–15.5)
WBC: 2.9 10*3/uL — ABNORMAL LOW (ref 4.0–10.5)
nRBC: 0 % (ref 0.0–0.2)

## 2020-11-11 LAB — TYPE AND SCREEN
ABO/RH(D): O POS
Antibody Screen: NEGATIVE

## 2020-11-11 LAB — ECHOCARDIOGRAM COMPLETE
Area-P 1/2: 3 cm2
Height: 73 in
S' Lateral: 2.7 cm
Weight: 4896 oz

## 2020-11-11 LAB — RESP PANEL BY RT-PCR (FLU A&B, COVID) ARPGX2
Influenza A by PCR: NEGATIVE
Influenza B by PCR: NEGATIVE
SARS Coronavirus 2 by RT PCR: POSITIVE — AB

## 2020-11-11 LAB — FOLATE: Folate: 10.8 ng/mL (ref >5.9)

## 2020-11-11 LAB — RETICULOCYTES
Immature Retic Fract: 9.4 % (ref 2.3–15.9)
RBC.: 2.95 MIL/uL — ABNORMAL LOW (ref 4.22–5.81)
Retic Count, Absolute: 51.6 10*3/uL (ref 19.0–186.0)
Retic Ct Pct: 1.8 % (ref 0.4–3.1)

## 2020-11-11 LAB — TSH: TSH: 4.321 u[IU]/mL (ref 0.350–4.500)

## 2020-11-11 LAB — MAGNESIUM: Magnesium: 1.9 mg/dL (ref 1.7–2.4)

## 2020-11-11 LAB — FERRITIN: Ferritin: 107 ng/mL (ref 24–336)

## 2020-11-11 LAB — VITAMIN B12: Vitamin B-12: 1177 pg/mL — ABNORMAL HIGH (ref 180–914)

## 2020-11-11 LAB — ABO/RH: ABO/RH(D): O POS

## 2020-11-11 LAB — C-REACTIVE PROTEIN: CRP: 1.2 mg/dL — ABNORMAL HIGH (ref <1.0)

## 2020-11-11 LAB — D-DIMER, QUANTITATIVE: D-Dimer, Quant: 8.35 ug/mL-FEU — ABNORMAL HIGH (ref 0.00–0.50)

## 2020-11-11 MED ORDER — ZINC SULFATE 220 (50 ZN) MG PO CAPS
220.0000 mg | ORAL_CAPSULE | Freq: Every day | ORAL | Status: DC
  Administered 2020-11-11 – 2020-11-18 (×8): 220 mg via ORAL
  Filled 2020-11-11 (×8): qty 1

## 2020-11-11 MED ORDER — INSULIN ASPART 100 UNIT/ML ~~LOC~~ SOLN: 0.0000 [IU] | Freq: Three times a day (TID) | SUBCUTANEOUS | Status: DC

## 2020-11-11 MED ORDER — AMLODIPINE BESYLATE 5 MG PO TABS
5.0000 mg | ORAL_TABLET | Freq: Every day | ORAL | Status: DC
  Filled 2020-11-11: qty 1

## 2020-11-11 MED ORDER — SODIUM CHLORIDE 0.9 % IV SOLN
200.0000 mg | Freq: Once | INTRAVENOUS | Status: AC
  Administered 2020-11-11: 200 mg via INTRAVENOUS
  Filled 2020-11-11: qty 40

## 2020-11-11 MED ORDER — INSULIN ASPART 100 UNIT/ML ~~LOC~~ SOLN
0.0000 [IU] | Freq: Every day | SUBCUTANEOUS | Status: DC
  Administered 2020-11-14: 2 [IU] via SUBCUTANEOUS

## 2020-11-11 MED ORDER — METHYLPREDNISOLONE SODIUM SUCC 40 MG IJ SOLR
40.0000 mg | Freq: Two times a day (BID) | INTRAMUSCULAR | Status: DC
  Administered 2020-11-11 – 2020-11-12 (×2): 40 mg via INTRAVENOUS
  Filled 2020-11-11 (×2): qty 1

## 2020-11-11 MED ORDER — SODIUM CHLORIDE 0.9 % IV SOLN
100.0000 mg | Freq: Every day | INTRAVENOUS | Status: AC
  Administered 2020-11-12 – 2020-11-15 (×4): 100 mg via INTRAVENOUS
  Filled 2020-11-11 (×4): qty 20

## 2020-11-11 MED ORDER — INSULIN ASPART 100 UNIT/ML ~~LOC~~ SOLN
4.0000 [IU] | Freq: Three times a day (TID) | SUBCUTANEOUS | Status: DC
  Administered 2020-11-11 – 2020-11-17 (×18): 4 [IU] via SUBCUTANEOUS

## 2020-11-11 MED ORDER — ORAL CARE MOUTH RINSE
15.0000 mL | Freq: Two times a day (BID) | OROMUCOSAL | Status: DC
  Administered 2020-11-11 – 2020-11-17 (×13): 15 mL via OROMUCOSAL

## 2020-11-11 MED ORDER — HYDROCOD POLST-CPM POLST ER 10-8 MG/5ML PO SUER: 5.0000 mL | Freq: Two times a day (BID) | ORAL | Status: DC | PRN

## 2020-11-11 MED ORDER — INSULIN ASPART 100 UNIT/ML ~~LOC~~ SOLN
0.0000 [IU] | Freq: Three times a day (TID) | SUBCUTANEOUS | Status: DC
  Administered 2020-11-12: 3 [IU] via SUBCUTANEOUS
  Administered 2020-11-12: 2 [IU] via SUBCUTANEOUS
  Administered 2020-11-12: 3 [IU] via SUBCUTANEOUS
  Administered 2020-11-13 (×2): 2 [IU] via SUBCUTANEOUS
  Administered 2020-11-13: 3 [IU] via SUBCUTANEOUS
  Administered 2020-11-14 (×2): 2 [IU] via SUBCUTANEOUS
  Administered 2020-11-14 – 2020-11-15 (×2): 3 [IU] via SUBCUTANEOUS
  Administered 2020-11-15: 2 [IU] via SUBCUTANEOUS
  Administered 2020-11-15 – 2020-11-16 (×3): 3 [IU] via SUBCUTANEOUS
  Administered 2020-11-17: 0 [IU] via SUBCUTANEOUS
  Administered 2020-11-17 – 2020-11-18 (×3): 2 [IU] via SUBCUTANEOUS

## 2020-11-11 MED ORDER — DAPAGLIFLOZIN PROPANEDIOL 5 MG PO TABS
5.0000 mg | ORAL_TABLET | Freq: Every day | ORAL | Status: DC
  Administered 2020-11-11 – 2020-11-18 (×8): 5 mg via ORAL
  Filled 2020-11-11 (×8): qty 1

## 2020-11-11 MED ORDER — SODIUM CHLORIDE 0.9 % IV SOLN: 100.0000 mg | Freq: Every day | INTRAVENOUS | Status: DC

## 2020-11-11 MED ORDER — ASCORBIC ACID 500 MG PO TABS
500.0000 mg | ORAL_TABLET | Freq: Every day | ORAL | Status: DC
  Administered 2020-11-11 – 2020-11-18 (×8): 500 mg via ORAL
  Filled 2020-11-11 (×8): qty 1

## 2020-11-11 MED ORDER — ALBUTEROL SULFATE HFA 108 (90 BASE) MCG/ACT IN AERS
2.0000 | INHALATION_SPRAY | Freq: Four times a day (QID) | RESPIRATORY_TRACT | Status: DC
  Administered 2020-11-11 – 2020-11-18 (×24): 2 via RESPIRATORY_TRACT
  Filled 2020-11-11: qty 6.7

## 2020-11-11 MED ORDER — METHYLPREDNISOLONE SODIUM SUCC 125 MG IJ SOLR: 0.5000 mg/kg | Freq: Two times a day (BID) | INTRAMUSCULAR | Status: DC

## 2020-11-11 NOTE — ED Notes (Signed)
Pt given [place] sandwich bag and ice water

## 2020-11-11 NOTE — Progress Notes (Signed)
ED nurse called unit to give report on Pt that had been pended for 0 min. Pager had not even gone off yet, therefore Pt had not yet been assigned.

## 2020-11-11 NOTE — ED Notes (Signed)
Dr. Shalhoub at bedside  

## 2020-11-11 NOTE — ED Notes (Signed)
Tele  Breakfast Ordered 

## 2020-11-11 NOTE — Progress Notes (Signed)
PROGRESS NOTE                                                                                                                                                                                                             Patient Demographics:    Paul Mclaughlin, is a 66 y.o. male, DOB - May 03, 1956, ZOX:[id number]  Outpatient Primary MD for the patient is [name], MD    LOS - 0  Admit date - 11/10/2020    Chief Complaint  Patient presents with  . Shortness of Breath  . Congestive Heart Failure       Brief Narrative (HPI from H&P)   this is a 65 year old Caucasian gentleman with history of VSD, CAD s/p CABG, hypertension, dyslipidemia, paroxysmal A. fib on Xarelto for anticoagulation who has taken 2 shots of COVID-19 mRNA vaccine so far presents to the hospital with 7 to 10-day history of progressive shortness of breath and some increasing edema, he was in the ER and was diagnosed with acute COVID-19 infection/pneumonia along with acute on chronic CHF exacerbation.   Subjective:    Waynetta Sandy today has, No headache, No chest pain, No abdominal pain - No Nausea, No new weakness tingling or numbness, mild dry cough and some exertional shortness of breath.   Assessment  & Plan :      1. Acute Hypoxic Resp. Failure due to Acute Covid 19 Viral Pneumonitis long with possible acute on chronic diastolic CHF percent in 2018 - he is vaccinated and could have had mild COVID-19 pneumonia, will place him on moderate dose IV steroids along with remdesivir, monitor inflammatory marker. Agree with IV Lasix for diuresis and repeating echocardiogram as well. Will monitor. If his COVID-19 pneumonia gets worse he has consented for Actemra use.  Encouraged the patient to sit up in chair in the daytime use I-S and flutter valve for pulmonary toiletry and then prone in bed when at night.  Will advance activity and titrate down oxygen as  possible.  Actemra/Baricitinib  off label use - patient was told that if COVID-19 pneumonitis gets worse we might potentially use Actemra off label, patient denies any known history of active diverticulitis, tuberculosis or hepatitis, understands the risks and benefits and wants to proceed with Actemra treatment if required.   SpO2: 100 % O2 Flow Rate (L/min): 2 L/min  Recent Labs  Lab 11/10/20 1722 11/10/20 1946 11/10/20 2241 11/11/20 0404  WBC 3.3*  --   --  2.9*  HGB 9.4*  --   --  9.6*  HCT 28.7*  --   --  30.7*  PLT 252  --   --  235  BNP  --  693.0*  --   --   AST  --  20  --  18  ALT  --  11  --  11  ALKPHOS  --  113  --  98  BILITOT  --  1.7*  --  1.6*  ALBUMIN  --  3.3*  --  3.1*  SARSCOV2NAA  --   --  POSITIVE*  --     2. CAD s/p CABG. Currently chest pain-free no acute issues, has history of VSD. Currently stable no acute issues, continue statin, Xarelto and ACE inhibitor combination for secondary prevention. Echocardiogram being repeated  3. Paroxysmal A. fib. [place] vas 2 score of greater than 3. Not on any rate controlling agents currently on Xarelto which will be continued.  4. Hypretension. On ACE inhibitor, resume home dose Norvasc and now on diuretics.  5. Dyslipidemia on statin.  6. GERD. PPI.  7. DM2 - continue Farxiga, placed on moderate dose ISS along with Premeal NovoLog, monitor in presence of steroids  Lab Results  Component Value Date   HGBA1C 5.3 11/10/2020   CBG (last 3)  Recent Labs    11/11/20 0734  GLUCAP 120*        Condition -   Guarded  Family Communication  : Patient will keep his family and friends updated himself, no immediate family around.  Code Status :  Full  Consults  :  None  PUD Prophylaxis : PPI   Procedures  :            Disposition Plan  :    Status is: Observation  Dispo: The patient is from: Home              Anticipated d/c is to: Home              Patient currently is not medically stable to  d/c.   Difficult to place patient No  DVT Prophylaxis  :  Xaralto  Lab Results  Component Value Date   PLT 235 11/11/2020    Diet :  Diet Order            Diet heart healthy/carb modified Room service appropriate? Yes; Fluid consistency: Thin  Diet effective now                  Inpatient Medications  Scheduled Meds: . albuterol  2 puff Inhalation Q6H  . amLODipine  5 mg Oral Daily  . vitamin C  500 mg Oral Daily  . atorvastatin  20 mg Oral Daily  . dapagliflozin propanediol  5 mg Oral QAC breakfast  . furosemide  60 mg Intravenous BID  . hydrALAZINE  100 mg Oral TID  . insulin aspart  0-15 Units Subcutaneous TID AC & HS  . insulin aspart  0-15 Units Subcutaneous TID WC  . insulin aspart  0-5 Units Subcutaneous QHS  . insulin aspart  4 Units Subcutaneous TID WC  . lisinopril  2.5 mg Oral Daily  . loratadine  10 mg Oral Daily  . methylPREDNISolone (SOLU-MEDROL) injection  40 mg Intravenous Q12H  . pantoprazole  40 mg Oral Daily  . rivaroxaban  20 mg Oral  Q supper  . sodium chloride flush  3 mL Intravenous Q12H  . zinc sulfate  220 mg Oral Daily   Continuous Infusions: . sodium chloride    . [START ON 11/12/2020] remdesivir 100 mg in NS 100 mL     PRN Meds:.sodium chloride, acetaminophen, albuterol, chlorpheniramine-HYDROcodone, ondansetron (ZOFRAN) IV, sodium chloride flush  Antibiotics  :    Anti-infectives (From admission, onward)   Start     Dose/Rate Route Frequency Ordered Stop   11/12/20 1000  remdesivir 100 mg in sodium chloride 0.9 % 100 mL IVPB  Status:  Discontinued       "Followed by" Linked Group Details   100 mg 200 mL/hr over 30 Minutes Intravenous Daily 11/11/20 0442 11/11/20 0822   11/12/20 1000  remdesivir 100 mg in sodium chloride 0.9 % 100 mL IVPB       "Followed by" Linked Group Details   100 mg 200 mL/hr over 30 Minutes Intravenous Daily 11/11/20 0822 11/16/20 0959   11/11/20 0500  remdesivir 200 mg in sodium chloride 0.9% 250 mL IVPB        "Followed by" Linked Group Details   200 mg 580 mL/hr over 30 Minutes Intravenous Once 11/11/20 0442 11/11/20 0726       Time Spent in minutes  30   Susa Raring M.D on 11/11/2020 at 8:38 AM  To page go to [email or web address]   [facility] -  Office  [phone]    See all Orders from today for further details    Objective:   Vitals:   11/11/20 0630 11/11/20 0638 11/11/20 0639 11/11/20 0639  BP: 126/69 (!) 148/74    Pulse: (!) 47 (!) 55 (!) 48   Resp:   14   Temp:    (!) 97.5 F (36.4 C)  TempSrc:    Oral  SpO2: 99% 100% 100%     Wt Readings from Last 3 Encounters:  11/10/20 (!) 138.8 kg  05/13/20 115.7 kg  12/31/19 120.7 kg     Intake/Output Summary (Last 24 hours) at 11/11/2020 0838 Last data filed at 11/11/2020 0749 Gross per 24 hour  Intake -  Output 150 ml  Net -150 ml     Physical Exam  Awake Alert, No new F.N deficits, Normal affect [place].AT,PERRAL Supple Neck,No JVD, No cervical lymphadenopathy appriciated.  Symmetrical Chest wall movement, Good air movement bilaterally, few rales RRR,No Gallops,Rubs or new Murmurs, No Parasternal Heave +ve B.Sounds, Abd Soft, No tenderness, No organomegaly appriciated, No rebound - guarding or rigidity. No Cyanosis, trace edema, left foot ray amputation    Data Review:    CBC Recent Labs  Lab 11/10/20 1722 11/10/20 1946 11/11/20 0404  WBC 3.3*  --  2.9*  HGB 9.4*  --  9.6*  HCT 28.7*  --  30.7*  PLT 252  --  235  MCV 96.6  --  98.7  MCH 31.6  --  30.9  MCHC 32.8  --  31.3  RDW 15.5  --  15.4  LYMPHSABS  --  0.4* 0.4*  MONOABS  --  0.3 0.3  EOSABS  --  0.1 0.1  BASOSABS  --  0.0 0.0    Recent Labs  Lab 11/10/20 1610 11/10/20 1722 11/10/20 1946 11/11/20 0404  NA  --  137  --  136  K  --  3.8  --  3.6  CL  --  101  --  100  CO2  --  26  --  26  GLUCOSE  --  119*  --  163*  BUN  --  21  --  20  CREATININE  --  1.47*  --  1.43*  CALCIUM  --  8.7*  --  8.7*  AST  --   --  20 18  ALT  --    --  11 11  ALKPHOS  --   --  113 98  BILITOT  --   --  1.7* 1.6*  ALBUMIN  --   --  3.3* 3.1*  MG  --   --   --  1.9  HGBA1C 5.3  --   --   --   BNP  --   --  693.0*  --     ------------------------------------------------------------------------------------------------------------------ No results for input(s): CHOL, HDL, LDLCALC, TRIG, CHOLHDL, LDLDIRECT in the last 72 hours.  Lab Results  Component Value Date   HGBA1C 5.3 11/10/2020   ------------------------------------------------------------------------------------------------------------------ No results for input(s): TSH, T4TOTAL, T3FREE, THYROIDAB in the last 72 hours.  Invalid input(s): FREET3  Cardiac Enzymes No results for input(s): CKMB, TROPONINI, MYOGLOBIN in the last 168 hours.  Invalid input(s): CK ------------------------------------------------------------------------------------------------------------------    Component Value Date/Time   BNP 693.0 (H) 11/10/2020 1946   BNP 301.0 (H) 11/24/2016 1150    Micro Results Recent Results (from the past 240 hour(s))  Resp Panel by RT-PCR (Flu A&B, Covid) Nasopharyngeal Swab     Status: Abnormal   Collection Time: 11/10/20 10:41 PM   Specimen: Nasopharyngeal Swab; Nasopharyngeal(NP) swabs in vial transport medium  Result Value Ref Range Status   SARS Coronavirus 2 by RT PCR POSITIVE (A) NEGATIVE Final    Comment: RESULT CALLED TO, READ BACK BY AND VERIFIED WITH: E SULLIVAN RN 0011 11/11/20 A BROWNING (NOTE) SARS-CoV-2 target nucleic acids are DETECTED.  The SARS-CoV-2 RNA is generally detectable in upper respiratory specimens during the acute phase of infection. Positive results are indicative of the presence of the identified virus, but do not rule out bacterial infection or co-infection with other pathogens not detected by the test. Clinical correlation with patient history and other diagnostic information is necessary to determine patient infection  status. The expected result is Negative.  Fact Sheet for Patients: BloggerCourse.com  Fact Sheet for Healthcare Providers: SeriousBroker.it  This test is not yet approved or cleared by the [place] FDA and  has been authorized for detection and/or diagnosis of SARS-CoV-2 by FDA under an Emergency Use Authorization (EUA).  This EUA will remain in effect (meaning this test can  be used) for the duration of  the COVID-19 declaration under Section 564(b)(1) of the Act, 21 U.S.C. section 360bbb-3(b)(1), unless the authorization is terminated or revoked sooner.     Influenza A by PCR NEGATIVE NEGATIVE Final   Influenza B by PCR NEGATIVE NEGATIVE Final    Comment: (NOTE) The Xpert Xpress SARS-CoV-2/FLU/RSV plus assay is intended as an aid in the diagnosis of influenza from Nasopharyngeal swab specimens and should not be used as a sole basis for treatment. Nasal washings and aspirates are unacceptable for Xpert Xpress SARS-CoV-2/FLU/RSV testing.  Fact Sheet for Patients: BloggerCourse.com  Fact Sheet for Healthcare Providers: SeriousBroker.it  This test is not yet approved or cleared by the [place] FDA and has been authorized for detection and/or diagnosis of SARS-CoV-2 by FDA under an Emergency Use Authorization (EUA). This EUA will remain in effect (meaning this test can be used) for the duration of the COVID-19 declaration under Section 564(b)(1) of the Act, 21 U.S.C. section 360bbb-3(b)(1), unless the  authorization is terminated or revoked.  Performed at [facility], [address] [place], [place] [address]     Radiology Reports DG Chest 2 View  Result Date: 11/10/2020 CLINICAL DATA:  Short of breath EXAM: CHEST - 2 VIEW COMPARISON:  03/30/2017 FINDINGS: Mild cardiomegaly with central vascular congestion. No overt edema or pleural effusion. No consolidation  or pneumothorax. Old fracture deformity of the left clavicle. IMPRESSION: Cardiomegaly with mild central vascular congestion. Electronically Signed   By: Jasmine Pang M.D.   On: 11/10/2020 17:48   US Abdomen Limited RUQ (LIVER/GB)  Result Date: 11/11/2020 CLINICAL DATA:  Cirrhosis.  Ascites.  Abdominal distention. EXAM: ULTRASOUND ABDOMEN LIMITED RIGHT UPPER QUADRANT COMPARISON:  No prior. FINDINGS: Gallbladder: No gallstones or wall thickening visualized. No sonographic Murphy sign noted by sonographer. Common bile duct: Diameter: 5.4 mm Liver: Increased echogenicity and nodular irregular cortex consistent with cirrhosis. No focal hepatic abnormality identified. Portal vein is patent on color Doppler imaging with normal direction of blood flow towards the liver. Other: Prominent ascites. IMPRESSION: 1. No gallstones or biliary distention. 2. Increased hepatic echogenicity and nodular hepatic cortex consistent with cirrhosis. No focal hepatic abnormality identified. 3. Prominent ascites. Electronically Signed   By: Maisie Fus  Register   On: 11/11/2020 06:58

## 2020-11-11 NOTE — Progress Notes (Signed)
Patient was admitted to 5W18 from [facility] ED. 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 RFA IV flushed and saline locked. Belongings include shoes and clothes, cell phone, license.  Patient ambulated to the bed from the stretcher with standby assist. He is on 3L [place] satting 97%. Other VS WNL. Denies pain.  Patient has been educated regarding fall risk precautions and using his call bell.

## 2020-11-11 NOTE — ED Notes (Signed)
Tried calling report to 5w... per [facility] .cranial nerves has not approved the room or looked at the patient chart

## 2020-11-11 NOTE — ED Notes (Signed)
Lunch Ordered @ 1000. °

## 2020-11-11 NOTE — Progress Notes (Signed)
  Echocardiogram 2D Echocardiogram has been performed.  Paul Mclaughlin Domenic Schoenberger 11/11/2020, 11:54 AM

## 2020-11-11 NOTE — ED Notes (Signed)
Pt O2 hanging out in upper 80s, low 90s - started on 2L [place]

## 2020-11-12 ENCOUNTER — Inpatient Hospital Stay (HOSPITAL_COMMUNITY): Payer: Medicaid Other

## 2020-11-12 DIAGNOSIS — R188 Other ascites: Secondary | ICD-10-CM | POA: Diagnosis not present

## 2020-11-12 DIAGNOSIS — I5033 Acute on chronic diastolic (congestive) heart failure: Secondary | ICD-10-CM | POA: Diagnosis not present

## 2020-11-12 DIAGNOSIS — I482 Chronic atrial fibrillation, unspecified: Secondary | ICD-10-CM | POA: Diagnosis not present

## 2020-11-12 DIAGNOSIS — R0602 Shortness of breath: Secondary | ICD-10-CM | POA: Diagnosis not present

## 2020-11-12 DIAGNOSIS — K7460 Unspecified cirrhosis of liver: Secondary | ICD-10-CM | POA: Diagnosis not present

## 2020-11-12 DIAGNOSIS — I5082 Biventricular heart failure: Secondary | ICD-10-CM | POA: Diagnosis not present

## 2020-11-12 DIAGNOSIS — U071 COVID-19: Secondary | ICD-10-CM | POA: Diagnosis not present

## 2020-11-12 DIAGNOSIS — N1831 Chronic kidney disease, stage 3a: Secondary | ICD-10-CM | POA: Diagnosis not present

## 2020-11-12 DIAGNOSIS — E1151 Type 2 diabetes mellitus with diabetic peripheral angiopathy without gangrene: Secondary | ICD-10-CM | POA: Diagnosis not present

## 2020-11-12 DIAGNOSIS — I130 Hypertensive heart and chronic kidney disease with heart failure and stage 1 through stage 4 chronic kidney disease, or unspecified chronic kidney disease: Secondary | ICD-10-CM | POA: Diagnosis not present

## 2020-11-12 DIAGNOSIS — J1282 Pneumonia due to coronavirus disease 2019: Secondary | ICD-10-CM | POA: Diagnosis not present

## 2020-11-12 DIAGNOSIS — E1122 Type 2 diabetes mellitus with diabetic chronic kidney disease: Secondary | ICD-10-CM | POA: Diagnosis not present

## 2020-11-12 DIAGNOSIS — Z6841 Body Mass Index (BMI) 40.0 and over, adult: Secondary | ICD-10-CM | POA: Diagnosis not present

## 2020-11-12 HISTORY — PX: IR PARACENTESIS: IMG2679

## 2020-11-12 LAB — CBC WITH DIFFERENTIAL/PLATELET
Abs Immature Granulocytes: 0.01 10*3/uL (ref 0.00–0.07)
Basophils Absolute: 0 10*3/uL (ref 0.0–0.1)
Basophils Relative: 0 %
Eosinophils Absolute: 0 10*3/uL (ref 0.0–0.5)
Eosinophils Relative: 0 %
HCT: 29.1 % — ABNORMAL LOW (ref 39.0–52.0)
Hemoglobin: 9.1 g/dL — ABNORMAL LOW (ref 13.0–17.0)
Immature Granulocytes: 0 %
Lymphocytes Relative: 5 %
Lymphs Abs: 0.2 10*3/uL — ABNORMAL LOW (ref 0.7–4.0)
MCH: 30.3 pg (ref 26.0–34.0)
MCHC: 31.3 g/dL (ref 30.0–36.0)
MCV: 97 fL (ref 80.0–100.0)
Monocytes Absolute: 0.1 10*3/uL (ref 0.1–1.0)
Monocytes Relative: 2 %
Neutro Abs: 4.1 10*3/uL (ref 1.7–7.7)
Neutrophils Relative %: 93 %
Platelets: 234 10*3/uL (ref 150–400)
RBC: 3 MIL/uL — ABNORMAL LOW (ref 4.22–5.81)
RDW: 15.2 % (ref 11.5–15.5)
WBC: 4.3 10*3/uL (ref 4.0–10.5)
nRBC: 0 % (ref 0.0–0.2)

## 2020-11-12 LAB — D-DIMER, QUANTITATIVE: D-Dimer, Quant: 8.64 ug/mL-FEU — ABNORMAL HIGH (ref 0.00–0.50)

## 2020-11-12 LAB — BODY FLUID CELL COUNT WITH DIFFERENTIAL
Eos, Fluid: 0 %
Lymphs, Fluid: 31 %
Monocyte-Macrophage-Serous Fluid: 66 % (ref 50–90)
Neutrophil Count, Fluid: 3 % (ref 0–25)
Total Nucleated Cell Count, Fluid: 107 cu mm (ref 0–1000)

## 2020-11-12 LAB — GLUCOSE, PLEURAL OR PERITONEAL FLUID: Glucose, Fluid: 170 mg/dL

## 2020-11-12 LAB — COMPREHENSIVE METABOLIC PANEL
ALT: 11 U/L (ref 0–44)
AST: 21 U/L (ref 15–41)
Albumin: 3 g/dL — ABNORMAL LOW (ref 3.5–5.0)
Alkaline Phosphatase: 102 U/L (ref 38–126)
Anion gap: 10 (ref 5–15)
BUN: 22 mg/dL (ref 8–23)
CO2: 26 mmol/L (ref 22–32)
Calcium: 8.6 mg/dL — ABNORMAL LOW (ref 8.9–10.3)
Chloride: 99 mmol/L (ref 98–111)
Creatinine, Ser: 1.67 mg/dL — ABNORMAL HIGH (ref 0.61–1.24)
GFR, Estimated: 46 mL/min — ABNORMAL LOW (ref >60)
Glucose, Bld: 142 mg/dL — ABNORMAL HIGH (ref 70–99)
Potassium: 4.4 mmol/L (ref 3.5–5.1)
Sodium: 135 mmol/L (ref 135–145)
Total Bilirubin: 1.3 mg/dL — ABNORMAL HIGH (ref 0.3–1.2)
Total Protein: 6.7 g/dL (ref 6.5–8.1)

## 2020-11-12 LAB — LACTATE DEHYDROGENASE, PLEURAL OR PERITONEAL FLUID: LD, Fluid: 50 U/L — ABNORMAL HIGH (ref 3–23)

## 2020-11-12 LAB — HEPATITIS PANEL, ACUTE
HCV Ab: NONREACTIVE
Hep A IgM: NONREACTIVE
Hep B C IgM: NONREACTIVE
Hepatitis B Surface Ag: NONREACTIVE

## 2020-11-12 LAB — GLUCOSE, CAPILLARY
Glucose-Capillary: 135 mg/dL — ABNORMAL HIGH (ref 70–99)
Glucose-Capillary: 176 mg/dL — ABNORMAL HIGH (ref 70–99)
Glucose-Capillary: 199 mg/dL — ABNORMAL HIGH (ref 70–99)
Glucose-Capillary: 167 mg/dL — ABNORMAL HIGH (ref 70–99)

## 2020-11-12 LAB — PROTEIN, PLEURAL OR PERITONEAL FLUID: Total protein, fluid: 4.3 g/dL

## 2020-11-12 LAB — MAGNESIUM: Magnesium: 2.1 mg/dL (ref 1.7–2.4)

## 2020-11-12 LAB — BRAIN NATRIURETIC PEPTIDE: B Natriuretic Peptide: 395.2 pg/mL — ABNORMAL HIGH (ref 0.0–100.0)

## 2020-11-12 LAB — ALBUMIN, PLEURAL OR PERITONEAL FLUID: Albumin, Fluid: 2.3 g/dL

## 2020-11-12 LAB — C-REACTIVE PROTEIN: CRP: 1.2 mg/dL — ABNORMAL HIGH (ref <1.0)

## 2020-11-12 MED ORDER — AMLODIPINE BESYLATE 5 MG PO TABS: 5.0000 mg | ORAL_TABLET | Freq: Every day | ORAL | Status: DC

## 2020-11-12 MED ORDER — METHYLPREDNISOLONE SODIUM SUCC 40 MG IJ SOLR
40.0000 mg | Freq: Every day | INTRAMUSCULAR | Status: DC
  Administered 2020-11-13 – 2020-11-16 (×4): 40 mg via INTRAVENOUS
  Filled 2020-11-12 (×4): qty 1

## 2020-11-12 MED ORDER — LIDOCAINE HCL 1 % IJ SOLN
INTRAMUSCULAR | Status: AC
  Filled 2020-11-12: qty 20

## 2020-11-12 MED ORDER — LIDOCAINE HCL 1 % IJ SOLN
INTRAMUSCULAR | Status: DC | PRN
  Administered 2020-11-12 – 2020-11-16 (×2): 10 mL

## 2020-11-12 MED ORDER — LACTATED RINGERS IV SOLN: INTRAVENOUS | Status: AC

## 2020-11-12 MED ORDER — CARVEDILOL 3.125 MG PO TABS
3.1250 mg | ORAL_TABLET | Freq: Two times a day (BID) | ORAL | Status: DC
  Administered 2020-11-12: 3.125 mg via ORAL
  Filled 2020-11-12: qty 1

## 2020-11-12 MED ORDER — ALBUMIN HUMAN 25 % IV SOLN: 50.0000 g | Freq: Once | INTRAVENOUS | Status: DC | PRN

## 2020-11-12 MED ORDER — HYDRALAZINE HCL 50 MG PO TABS: 50.0000 mg | ORAL_TABLET | Freq: Three times a day (TID) | ORAL | Status: DC

## 2020-11-12 NOTE — Progress Notes (Signed)
PROGRESS NOTE                                                                                                                                                                                                             Patient Demographics:    Paul Mclaughlin, is a 65 y.o. male, DOB - [birth year], RDE:[id number]  Outpatient Primary MD for the patient is [name], MD    LOS - 1  Admit date - 11/10/2020    Chief Complaint  Patient presents with  . Shortness of Breath  . Congestive Heart Failure       Brief Narrative (HPI from H&P)   this is a 65 year old Caucasian gentleman with history of VSD, CAD s/p CABG, hypertension, dyslipidemia, paroxysmal A. fib on Xarelto for anticoagulation who has taken 2 shots of COVID-19 mRNA vaccine so far presents to the hospital with 7 to 10-day history of progressive shortness of breath and some increasing edema, he was in the ER and was diagnosed with acute COVID-19 infection/pneumonia along with acute on chronic CHF exacerbation.  He was also noted to have abdominal distention with possible ascites causing shortness of breath   Subjective:   Patient sitting in bed in no distress, no chest pain, shortness of breath has improved, leg swelling has improved, still has some distention in his abdomen but no pain   Assessment  & Plan :      1. Acute Hypoxic Resp. Failure due to Acute Covid 19 Viral Pneumonitis long with possible acute on chronic diastolic CHF percent in 2018 - he is vaccinated and could have had mild COVID-19 pneumonia, will place him on moderate dose IV steroids along with remdesivir, monitor inflammatory marker.  Has been diuresed with Lasix, I think there is some element of undiagnosed ascites which is contributing to his shortness of breath as well, kindly see #8 below.  Encouraged the patient to sit up in chair in the daytime use I-S and flutter valve for pulmonary toiletry  and then prone in bed when at night.  Will advance activity and titrate down oxygen as possible.  SpO2: 100 % O2 Flow Rate (L/min): 2 L/min  Recent Labs  Lab 11/10/20 1722 11/10/20 1946 11/10/20 2241 11/11/20 0404 11/11/20 0745 11/12/20 0134  WBC 3.3*  --   --  2.9*  --  4.3  HGB 9.4*  --   --  9.6*  --  9.1*  HCT 28.7*  --   --  30.7*  --  29.1*  PLT 252  --   --  235  --  234  CRP  --   --   --   --  1.2* 1.2*  BNP  --  693.0*  --   --   --  395.2*  DDIMER  --   --   --   --  8.35* 8.64*  AST  --  20  --  18  --  21  ALT  --  11  --  11  --  11  ALKPHOS  --  113  --  98  --  102  BILITOT  --  1.7*  --  1.6*  --  1.3*  ALBUMIN  --  3.3*  --  3.1*  --  3.0*  SARSCOV2NAA  --   --  POSITIVE*  --   --   --     2. CAD s/p CABG. Currently chest pain-free no acute issues, has history of VSD. Currently stable no acute issues, continue statin, Xarelto echo shows preserved EF, discontinue ACE for now as he has developed mild AKI.  Only on Norvasc and hydralazine will change medications to beta-blocker and low-dose hydralazine.  3. Paroxysmal A. fib. [place] vas 2 score of greater than 3.  Continue Xarelto now on Coreg as well.  4. Hypretension.  Blood pressure slightly soft for now only Coreg and monitor.  Hold Norvasc and hydralazine..  5. Dyslipidemia on statin.  6. GERD. PPI.  7.  AKI.  Hold further diuretics, hold ACE inhibitor, avoid hypotension, blood pressure medications adjusted as well, gentle hydration for 5 hours and repeat BMP tomorrow.   8.  Abdominal distention with ultrasound revealing cirrhosis and ascites, therapeutic and diagnostic paracentesis on 11/12/2020, acute hepatitis panel and monitor.  Albumin ordered with the paracentesis.   9. DM2 - continue Farxiga, placed on moderate dose ISS along with Premeal NovoLog, monitor in presence of steroids  Lab Results  Component Value Date   HGBA1C 5.3 11/10/2020   CBG (last 3)  Recent Labs    11/11/20 1659  11/11/20 2052 11/12/20 0835  GLUCAP 104* 89 135*        Condition -   Guarded  Family Communication  : Patient will keep his family and friends updated himself, no immediate family around.  Code Status :  Full  Consults  :  None  PUD Prophylaxis : PPI   Procedures  :     Right upper quadrant ultrasound.  Possible cirrhosis.  TTE - 1. Left ventricular ejection fraction, by estimation, is 55%. The left ventricle has normal function. The left ventricle has no regional wall motion abnormalities. Left ventricular diastolic parameters are indeterminate.  2. Right ventricular systolic function is moderately reduced. The right ventricular size is severely enlarged. There is severely elevated pulmonary artery systolic pressure. The estimated right ventricular systolic pressure is 73.7 mmHg. D-shaped interventricular septum suggestive of RV pressure/volume overload.  3. Left atrial size was moderately dilated.  4. Right atrial size was moderately dilated.  5. The mitral valve is normal in structure. Trivial mitral valve regurgitation. No evidence of mitral stenosis.  6. Tricuspid valve regurgitation is mild to moderate.  7. The aortic valve is tricuspid. Aortic valve regurgitation is not visualized. Mild aortic valve sclerosis is present, with no evidence of aortic valve stenosis.  8.  The inferior vena cava is dilated in size with <50% respiratory variability, suggesting right atrial pressure of 15 mmHg.  9. Prominent ascites noted. 10. The patient was in atrial fibrillation.       Disposition Plan  :    Status is: Inpatient  Dispo: The patient is from: Home              Anticipated d/c is to: Home              Patient currently is not medically stable to d/c.   Difficult to place patient No  DVT Prophylaxis  :  Xaralto  Lab Results  Component Value Date   PLT 234 11/12/2020    Diet :  Diet Order            Diet heart healthy/carb modified Room service appropriate? Yes; Fluid  consistency: Thin  Diet effective now                  Inpatient Medications  Scheduled Meds: . albuterol  2 puff Inhalation Q6H  . [START ON 11/13/2020] amLODipine  5 mg Oral Daily  . vitamin C  500 mg Oral Daily  . atorvastatin  20 mg Oral Daily  . dapagliflozin propanediol  5 mg Oral QAC breakfast  . hydrALAZINE  50 mg Oral Q8H  . insulin aspart  0-15 Units Subcutaneous TID WC  . insulin aspart  0-5 Units Subcutaneous QHS  . insulin aspart  4 Units Subcutaneous TID WC  . loratadine  10 mg Oral Daily  . mouth rinse  15 mL Mouth Rinse BID  . [START ON 11/13/2020] methylPREDNISolone (SOLU-MEDROL) injection  40 mg Intravenous Daily  . pantoprazole  40 mg Oral Daily  . rivaroxaban  20 mg Oral Q supper  . zinc sulfate  220 mg Oral Daily   Continuous Infusions: . albumin human    . lactated ringers 100 mL/hr at 11/12/20 0935  . remdesivir 100 mg in NS 100 mL 100 mg (11/12/20 0906)   PRN Meds:.acetaminophen, albumin human, albuterol, chlorpheniramine-HYDROcodone, ondansetron (ZOFRAN) IV  Antibiotics  :    Anti-infectives (From admission, onward)   Start     Dose/Rate Route Frequency Ordered Stop   11/12/20 1000  remdesivir 100 mg in sodium chloride 0.9 % 100 mL IVPB  Status:  Discontinued       "Followed by" Linked Group Details   100 mg 200 mL/hr over 30 Minutes Intravenous Daily 11/11/20 0442 11/11/20 0822   11/12/20 1000  remdesivir 100 mg in sodium chloride 0.9 % 100 mL IVPB       "Followed by" Linked Group Details   100 mg 200 mL/hr over 30 Minutes Intravenous Daily 11/11/20 0822 11/16/20 0959   11/11/20 0500  remdesivir 200 mg in sodium chloride 0.9% 250 mL IVPB       "Followed by" Linked Group Details   200 mg 580 mL/hr over 30 Minutes Intravenous Once 11/11/20 0442 11/11/20 0726       Time Spent in minutes  30   Susa Raring M.D on 11/12/2020 at 11:28 AM  To page go to [email or web address]   [facility] -  Office  [phone]    See all Orders from today  for further details    Objective:   Vitals:   11/11/20 1800 11/11/20 2040 11/11/20 2110 11/12/20 0425  BP: 137/72 94/64  119/76  Pulse: 70 60  62  Resp: Temp: 97.6 F (36.4 C) 97.6  F (36.4 C)  97.7 F (36.5 C)  TempSrc: Oral Oral  Axillary  SpO2: 97% 100%  100%  Weight:   (!) 137.9 kg (!) 138 kg  Height:   6\' 1"  (1.854 m)     Wt Readings from Last 3 Encounters:  11/12/20 (!) 138 kg  11/10/20 (!) 138.8 kg  05/13/20 115.7 kg     Intake/Output Summary (Last 24 hours) at 11/12/2020 1128 Last data filed at 11/12/2020 0909 Gross per 24 hour  Intake 240 ml  Output 2770 ml  Net -2530 ml     Physical Exam  Awake Alert, No new F.N deficits, Normal affect [place].AT,PERRAL Supple Neck,No JVD, No cervical lymphadenopathy appriciated.  Symmetrical Chest wall movement, Good air movement bilaterally, CTAB RRR,No Gallops, Rubs or new Murmurs, No Parasternal Heave +ve B.Sounds, Abd distended. No Cyanosis, trace edema, left foot ray amputation    Data Review:    CBC Recent Labs  Lab 11/10/20 1722 11/10/20 1946 11/11/20 0404 11/12/20 0134  WBC 3.3*  --  2.9* 4.3  HGB 9.4*  --  9.6* 9.1*  HCT 28.7*  --  30.7* 29.1*  PLT 252  --  235 234  MCV 96.6  --  98.7 97.0  MCH 31.6  --  30.9 30.3  MCHC 32.8  --  31.3 31.3  RDW 15.5  --  15.4 15.2  LYMPHSABS  --  0.4* 0.4* 0.2*  MONOABS  --  0.3 0.3 0.1  EOSABS  --  0.1 0.1 0.0  BASOSABS  --  0.0 0.0 0.0    Recent Labs  Lab 11/10/20 1610 11/10/20 1722 11/10/20 1946 11/11/20 0404 11/11/20 0745 11/12/20 0134  NA  --  137  --  136  --  135  K  --  3.8  --  3.6  --  4.4  CL  --  101  --  100  --  99  CO2  --  26  --  26  --  26  GLUCOSE  --  119*  --  163*  --  142*  BUN  --  21  --  20  --  22  CREATININE  --  1.47*  --  1.43*  --  1.67*  CALCIUM  --  8.7*  --  8.7*  --  8.6*  AST  --   --  20 18  --  21  ALT  --   --  11 11  --  11  ALKPHOS  --   --  113 98  --  102  BILITOT  --   --  1.7* 1.6*  --  1.3*   ALBUMIN  --   --  3.3* 3.1*  --  3.0*  MG  --   --   --  1.9  --  2.1  CRP  --   --   --   --  1.2* 1.2*  DDIMER  --   --   --   --  8.35* 8.64*  TSH  --   --   --   --  4.321  --   HGBA1C 5.3  --   --   --   --   --   BNP  --   --  693.0*  --   --  395.2*   ------------------------------------------------------------------------------------------------------------------ No results for input(s): CHOL, HDL, LDLCALC, TRIG, CHOLHDL, LDLDIRECT in the last 72 hours.  Lab Results  Component Value Date   HGBA1C 5.3 11/10/2020   ------------------------------------------------------------------------------------------------------------------  Recent Labs    11/11/20 0745  TSH 4.321    Cardiac Enzymes No results for input(s): CKMB, TROPONINI, MYOGLOBIN in the last 168 hours.  Invalid input(s): CK ------------------------------------------------------------------------------------------------------------------    Component Value Date/Time   BNP 395.2 (H) 11/12/2020 0134   BNP 301.0 (H) 11/24/2016 1150    Micro Results Recent Results (from the past 240 hour(s))  Resp Panel by RT-PCR (Flu A&B, Covid) Nasopharyngeal Swab     Status: Abnormal   Collection Time: 11/10/20 10:41 PM   Specimen: Nasopharyngeal Swab; Nasopharyngeal(NP) swabs in vial transport medium  Result Value Ref Range Status   SARS Coronavirus 2 by RT PCR POSITIVE (A) NEGATIVE Final    Comment: RESULT CALLED TO, READ BACK BY AND VERIFIED WITH: E SULLIVAN RN 0011 11/11/20 A BROWNING (NOTE) SARS-CoV-2 target nucleic acids are DETECTED.  The SARS-CoV-2 RNA is generally detectable in upper respiratory specimens during the acute phase of infection. Positive results are indicative of the presence of the identified virus, but do not rule out bacterial infection or co-infection with other pathogens not detected by the test. Clinical correlation with patient history and other diagnostic information is necessary to determine  patient infection status. The expected result is Negative.  Fact Sheet for Patients: BloggerCourse.com  Fact Sheet for Healthcare Providers: SeriousBroker.it  This test is not yet approved or cleared by the [place] FDA and  has been authorized for detection and/or diagnosis of SARS-CoV-2 by FDA under an Emergency Use Authorization (EUA).  This EUA will remain in effect (meaning this test can  be used) for the duration of  the COVID-19 declaration under Section 564(b)(1) of the Act, 21 U.S.C. section 360bbb-3(b)(1), unless the authorization is terminated or revoked sooner.     Influenza A by PCR NEGATIVE NEGATIVE Final   Influenza B by PCR NEGATIVE NEGATIVE Final    Comment: (NOTE) The Xpert Xpress SARS-CoV-2/FLU/RSV plus assay is intended as an aid in the diagnosis of influenza from Nasopharyngeal swab specimens and should not be used as a sole basis for treatment. Nasal washings and aspirates are unacceptable for Xpert Xpress SARS-CoV-2/FLU/RSV testing.  Fact Sheet for Patients: BloggerCourse.com  Fact Sheet for Healthcare Providers: SeriousBroker.it  This test is not yet approved or cleared by the [place] FDA and has been authorized for detection and/or diagnosis of SARS-CoV-2 by FDA under an Emergency Use Authorization (EUA). This EUA will remain in effect (meaning this test can be used) for the duration of the COVID-19 declaration under Section 564(b)(1) of the Act, 21 U.S.C. section 360bbb-3(b)(1), unless the authorization is terminated or revoked.  Performed at [facility], [address] [place], [place] [address]     Radiology Reports DG Chest 2 View  Result Date: 11/10/2020 CLINICAL DATA:  Short of breath EXAM: CHEST - 2 VIEW COMPARISON:  03/30/2017 FINDINGS: Mild cardiomegaly with central vascular congestion. No overt edema or pleural  effusion. No consolidation or pneumothorax. Old fracture deformity of the left clavicle. IMPRESSION: Cardiomegaly with mild central vascular congestion. Electronically Signed   By: Jasmine Pang M.D.   On: 11/10/2020 17:48   DG Chest Port 1 View  Result Date: 11/12/2020 CLINICAL DATA:  Shortness of breath. EXAM: PORTABLE CHEST 1 VIEW COMPARISON:  November 10, 2020. FINDINGS: Stable cardiomegaly. No pneumothorax or pleural effusion is noted. Lungs are clear. Bony thorax is unremarkable. IMPRESSION: No active disease. Electronically Signed   By: Lupita Raider M.D.   On: 11/12/2020 09:10   ECHOCARDIOGRAM COMPLETE  Result Date: 11/11/2020    ECHOCARDIOGRAM REPORT   Patient Name:   EMONTE DIEUJUSTE Date of Exam: 11/11/2020 Medical Rec #:  [id number]       Height:       73.0 in Accession #:    [id number]      Weight:       306.0 lb Date of Birth:  25-Jul-1956       BSA:          2.578 m Patient Age:    64 years        BP:           134/74 mmHg Patient Gender: M               HR:           47 bpm. Exam Location:  Inpatient Procedure: 2D Echo, Cardiac Doppler and Color Doppler Indications:    I50.33 Acute on chronic diastolic (congestive) heart failure  History:        Patient has prior history of Echocardiogram examinations, most                 recent 11/28/2016. CHF, Arrythmias:Atrial Fibrillation,                 Signs/Symptoms:Dyspnea; Risk Factors:Diabetes. COVID-19                 Positive.  Sonographer:    Elmarie Shiley Dance Referring Phys: [id number] Deno Lunger [place] IMPRESSIONS  1. Left ventricular ejection fraction, by estimation, is 55%. The left ventricle has normal function. The left ventricle has no regional wall motion abnormalities. Left ventricular diastolic parameters are indeterminate.  2. Right ventricular systolic function is moderately reduced. The right ventricular size is severely enlarged. There is severely elevated pulmonary artery systolic pressure. The estimated right ventricular systolic pressure is  73.7 mmHg. D-shaped interventricular septum suggestive of RV pressure/volume overload.  3. Left atrial size was moderately dilated.  4. Right atrial size was moderately dilated.  5. The mitral valve is normal in structure. Trivial mitral valve regurgitation. No evidence of mitral stenosis.  6. Tricuspid valve regurgitation is mild to moderate.  7. The aortic valve is tricuspid. Aortic valve regurgitation is not visualized. Mild aortic valve sclerosis is present, with no evidence of aortic valve stenosis.  8. The inferior vena cava is dilated in size with <50% respiratory variability, suggesting right atrial pressure of 15 mmHg.  9. Prominent ascites noted. 10. The patient was in atrial fibrillation. FINDINGS  Left Ventricle: Left ventricular ejection fraction, by estimation, is 55%. The left ventricle has normal function. The left ventricle has no regional wall motion abnormalities. The left ventricular internal cavity size was normal in size. There is no left ventricular hypertrophy. Left ventricular diastolic parameters are indeterminate. Right Ventricle: The right ventricular size is severely enlarged. No increase in right ventricular wall thickness. Right ventricular systolic function is moderately reduced. There is severely elevated pulmonary artery systolic pressure. The tricuspid regurgitant velocity is 3.83 m/s, and with an assumed right atrial pressure of 15 mmHg, the estimated right ventricular systolic pressure is 73.7 mmHg. Left Atrium: Left atrial size was moderately dilated. Right Atrium: Right atrial size was moderately dilated. Pericardium: There is no evidence of pericardial effusion. Mitral Valve: The mitral valve is normal in structure. Mild mitral annular calcification. Trivial mitral valve regurgitation. No evidence of mitral valve stenosis. Tricuspid Valve: The tricuspid valve is normal in structure. Tricuspid valve regurgitation is mild to moderate. Aortic Valve:  The aortic valve is tricuspid.  Aortic valve regurgitation is not visualized. Mild aortic valve sclerosis is present, with no evidence of aortic valve stenosis. Pulmonic Valve: The pulmonic valve was normal in structure. Pulmonic valve regurgitation is trivial. Aorta: The aortic root is normal in size and structure. Venous: The inferior vena cava is dilated in size with less than 50% respiratory variability, suggesting right atrial pressure of 15 mmHg. IAS/Shunts: No atrial level shunt detected by color flow Doppler.  LEFT VENTRICLE PLAX 2D LVIDd:         5.00 cm LVIDs:         2.70 cm LV PW:         1.50 cm LV IVS:        0.90 cm LVOT diam:     2.00 cm LV SV:         86 LV SV Index:   33 LVOT Area:     3.14 cm  RIGHT VENTRICLE          IVC RV Basal diam:  3.90 cm  IVC diam: 3.30 cm RV Mid diam:    2.60 cm TAPSE (M-mode): 1.8 cm LEFT ATRIUM              Index       RIGHT ATRIUM           Index LA diam:        5.40 cm  2.09 cm/m  RA Area:     27.40 cm LA Vol (A2C):   119.0 ml 46.16 ml/m RA Volume:   92.90 ml  36.04 ml/m LA Vol (A4C):   102.0 ml 39.57 ml/m LA Biplane Vol: 119.0 ml 46.16 ml/m  AORTIC VALVE LVOT Vmax:   119.50 cm/s LVOT Vmean:  78.700 cm/s LVOT VTI:    0.272 m  AORTA Ao Root diam: 3.50 cm Ao Asc diam:  3.20 cm MITRAL VALVE                TRICUSPID VALVE MV Area (PHT): 3.00 cm     TR Peak grad:   58.7 mmHg MV Decel Time: 253 msec     TR Vmax:        383.00 cm/s MV E velocity: 105.00 cm/s MV A velocity: 36.10 cm/s   SHUNTS MV E/A ratio:  2.91         Systemic VTI:  0.27 m                             Systemic Diam: 2.00 cm Marca Ancona MD Electronically signed by Marca Ancona MD Signature Date/Time: 11/11/2020/4:21:17 PM    Final    US Abdomen Limited RUQ (LIVER/GB)  Result Date: 11/11/2020 CLINICAL DATA:  Cirrhosis.  Ascites.  Abdominal distention. EXAM: ULTRASOUND ABDOMEN LIMITED RIGHT UPPER QUADRANT COMPARISON:  No prior. FINDINGS: Gallbladder: No gallstones or wall thickening visualized. No sonographic Murphy sign noted by  sonographer. Common bile duct: Diameter: 5.4 mm Liver: Increased echogenicity and nodular irregular cortex consistent with cirrhosis. No focal hepatic abnormality identified. Portal vein is patent on color Doppler imaging with normal direction of blood flow towards the liver. Other: Prominent ascites. IMPRESSION: 1. No gallstones or biliary distention. 2. Increased hepatic echogenicity and nodular hepatic cortex consistent with cirrhosis. No focal hepatic abnormality identified. 3. Prominent ascites. Electronically Signed   By: Maisie Fus  Register   On: 11/11/2020 06:58

## 2020-11-12 NOTE — Discharge Instructions (Signed)

## 2020-11-12 NOTE — Procedures (Signed)
Ultrasound-guided diagnostic and therapeutic paracentesis performed yielding 5 liters (maximum ordered)of yellow fluid. No immediate complications. A portion of the fluid was sent to the lab for preordered studies. EBL none.   

## 2020-11-13 ENCOUNTER — Inpatient Hospital Stay (HOSPITAL_COMMUNITY): Payer: Medicaid Other

## 2020-11-13 DIAGNOSIS — I5033 Acute on chronic diastolic (congestive) heart failure: Secondary | ICD-10-CM | POA: Diagnosis not present

## 2020-11-13 HISTORY — PX: IR PARACENTESIS: IMG2679

## 2020-11-13 LAB — MAGNESIUM: Magnesium: 1.9 mg/dL (ref 1.7–2.4)

## 2020-11-13 LAB — COMPREHENSIVE METABOLIC PANEL
ALT: 10 U/L (ref 0–44)
AST: 13 U/L — ABNORMAL LOW (ref 15–41)
Albumin: 2.6 g/dL — ABNORMAL LOW (ref 3.5–5.0)
Alkaline Phosphatase: 83 U/L (ref 38–126)
Anion gap: 8 (ref 5–15)
BUN: 25 mg/dL — ABNORMAL HIGH (ref 8–23)
CO2: 25 mmol/L (ref 22–32)
Calcium: 8.5 mg/dL — ABNORMAL LOW (ref 8.9–10.3)
Chloride: 98 mmol/L (ref 98–111)
Creatinine, Ser: 1.5 mg/dL — ABNORMAL HIGH (ref 0.61–1.24)
GFR, Estimated: 52 mL/min — ABNORMAL LOW (ref >60)
Glucose, Bld: 169 mg/dL — ABNORMAL HIGH (ref 70–99)
Potassium: 4.3 mmol/L (ref 3.5–5.1)
Sodium: 131 mmol/L — ABNORMAL LOW (ref 135–145)
Total Bilirubin: 1 mg/dL (ref 0.3–1.2)
Total Protein: 5.9 g/dL — ABNORMAL LOW (ref 6.5–8.1)

## 2020-11-13 LAB — CBC WITH DIFFERENTIAL/PLATELET
Abs Immature Granulocytes: 0.02 10*3/uL (ref 0.00–0.07)
Basophils Absolute: 0 10*3/uL (ref 0.0–0.1)
Basophils Relative: 0 %
Eosinophils Absolute: 0 10*3/uL (ref 0.0–0.5)
Eosinophils Relative: 0 %
HCT: 27 % — ABNORMAL LOW (ref 39.0–52.0)
Hemoglobin: 8.7 g/dL — ABNORMAL LOW (ref 13.0–17.0)
Immature Granulocytes: 0 %
Lymphocytes Relative: 6 %
Lymphs Abs: 0.3 10*3/uL — ABNORMAL LOW (ref 0.7–4.0)
MCH: 30.6 pg (ref 26.0–34.0)
MCHC: 32.2 g/dL (ref 30.0–36.0)
MCV: 95.1 fL (ref 80.0–100.0)
Monocytes Absolute: 0.2 10*3/uL (ref 0.1–1.0)
Monocytes Relative: 4 %
Neutro Abs: 4.8 10*3/uL (ref 1.7–7.7)
Neutrophils Relative %: 90 %
Platelets: 198 10*3/uL (ref 150–400)
RBC: 2.84 MIL/uL — ABNORMAL LOW (ref 4.22–5.81)
RDW: 14.6 % (ref 11.5–15.5)
WBC: 5.3 10*3/uL (ref 4.0–10.5)
nRBC: 0 % (ref 0.0–0.2)

## 2020-11-13 LAB — GLUCOSE, CAPILLARY
Glucose-Capillary: 129 mg/dL — ABNORMAL HIGH (ref 70–99)
Glucose-Capillary: 135 mg/dL — ABNORMAL HIGH (ref 70–99)
Glucose-Capillary: 165 mg/dL — ABNORMAL HIGH (ref 70–99)
Glucose-Capillary: 191 mg/dL — ABNORMAL HIGH (ref 70–99)

## 2020-11-13 LAB — GRAM STAIN

## 2020-11-13 LAB — D-DIMER, QUANTITATIVE: D-Dimer, Quant: 7.88 ug/mL-FEU — ABNORMAL HIGH (ref 0.00–0.50)

## 2020-11-13 LAB — TSH: TSH: 3.036 u[IU]/mL (ref 0.350–4.500)

## 2020-11-13 LAB — PH, BODY FLUID: pH, Body Fluid: 7.7

## 2020-11-13 LAB — C-REACTIVE PROTEIN: CRP: 0.8 mg/dL (ref <1.0)

## 2020-11-13 LAB — PATHOLOGIST SMEAR REVIEW

## 2020-11-13 LAB — BRAIN NATRIURETIC PEPTIDE: B Natriuretic Peptide: 442 pg/mL — ABNORMAL HIGH (ref 0.0–100.0)

## 2020-11-13 MED ORDER — ALBUMIN HUMAN 25 % IV SOLN
50.0000 g | Freq: Once | INTRAVENOUS | Status: AC | PRN
  Administered 2020-11-13: 50 g via INTRAVENOUS
  Filled 2020-11-13: qty 200

## 2020-11-13 MED ORDER — LIDOCAINE HCL 1 % IJ SOLN
INTRAMUSCULAR | Status: AC
  Filled 2020-11-13: qty 20

## 2020-11-13 NOTE — Progress Notes (Signed)
Patient is en route to IR for paracentesis. 

## 2020-11-13 NOTE — Consult Note (Addendum)
Reason for Consult: Ascites, cirrhosis, and diastolic heart failure. Referring Physician: [facility]  [employer] HPI: This is a 65 year old male with a PMH of diastolic CHF, pulmonary HTN, chronic afib, HTN, and DM2 admitted for complaints of SOB.  He was tested and he was noted to have COVID, even though he was vaccinated.  On admission he was found to have a component of diastolic CHF with an elevation in his BNP.  His SOB started 10 days ago and he denied any chest pain or fever.  There was an associated LE edema with his SOB.  The CXR was consistent with vascular congestion and cardiomegaly.  Lasix 80 mg IV was provided for him and it helped his SOB.  Over the past 6 months he reports having a progressive abdominal distension.  It was painless and he did not have any fever.  Imaging with  RUQ U/S showed ascites and a nodular liver consistent with cirrhosis.  In his estimation, he gained 80 lbs.  The patient denies drinking ETOH for the past 10 years.  In his 20's and 30's he drank heavily, but over the years ETOH did not play a significant role in his life.  The viral hepatitis panel was negative and he denies any known family history of liver disease.  Five liters of ascites was removed with the paracentesis and he is to have another paracentesis today.  The SAAG with the first tap showed that it was 0.7 with a total protein of 4.3.  The WBC was low at 107 and only 3% of the total WBC were neutrophils.  Currently he is feeling well.  Past Medical History:  Diagnosis Date  . Atrial fibrillation (HCC)   . Cellulitis and abscess of left leg 06/2016  . CHF (congestive heart failure) (HCC)   . Diabetes (HCC)   . Dyspnea     Past Surgical History:  Procedure Laterality Date  . AMPUTATION Left 06/24/2016   Procedure: FOOT FIFTH RAY TOE AMPUTATION;  Surgeon: [name], MD;  Location: [facility] OR;  Service: Orthopedics;  Laterality: Left;  . IR PARACENTESIS  11/12/2020  . open heart surgery      As a child.  [age] years old.  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   [name] RIGHT/LEFT HEART CATH AND CORONARY ANGIOGRAPHY N/A 03/30/2017   Procedure: Right/Left Heart Cath and Coronary Angiography;  Surgeon: [name], MD;  Location: [facility] INVASIVE CV LAB;  Service: Cardiovascular;  Laterality: N/A;    Family History  Problem Relation Age of Onset  . Diabetes Mellitus II Mother        ESRD  . Diabetes Mellitus II Brother   . Emphysema Father     Social History:  reports that he has never smoked. He has never used smokeless tobacco. He reports that he does not drink alcohol and does not use drugs.  Allergies: No Known Allergies  Medications:  Scheduled: . albuterol  2 puff Inhalation Q6H  . vitamin C  500 mg Oral Daily  . atorvastatin  20 mg Oral Daily  . dapagliflozin propanediol  5 mg Oral QAC breakfast  . insulin aspart  0-15 Units Subcutaneous TID WC  . insulin aspart  0-5 Units Subcutaneous QHS  . insulin aspart  4 Units Subcutaneous TID WC  . loratadine  10 mg Oral Daily  . mouth rinse  15 mL Mouth Rinse BID  . methylPREDNISolone (SOLU-MEDROL) injection  40 mg Intravenous Daily  . pantoprazole  40 mg Oral  Daily  . rivaroxaban  20 mg Oral Q supper  . zinc sulfate  220 mg Oral Daily   Continuous: . albumin human    . remdesivir 100 mg in NS 100 mL 100 mg (11/13/20 0810)    Results for orders placed or performed during the hospital encounter of 11/10/20 (from the past 24 hour(s))  Lactate dehydrogenase (pleural or peritoneal fluid)     Status: Abnormal   Collection Time: 11/12/20  3:51 PM  Result Value Ref Range   LD, Fluid 50 (H) 3 - 23 U/L   Fluid Type-FLDH PERITONEAL   Body fluid cell count with differential     Status: Abnormal   Collection Time: 11/12/20  3:51 PM  Result Value Ref Range   Fluid Type-FCT PERITONEAL    Color, Fluid YELLOW (A) YELLOW   Appearance, Fluid CLEAR CLEAR   Total Nucleated Cell Count, Fluid 107 0 - 1,000 cu mm   Neutrophil Count, Fluid 3 0 - 25 %    Lymphs, Fluid 31 %   Monocyte-Macrophage-Serous Fluid 66 50 - 90 %   Eos, Fluid 0 %  Albumin, pleural or peritoneal fluid     Status: None   Collection Time: 11/12/20  3:51 PM  Result Value Ref Range   Albumin, Fluid 2.3 g/dL   Fluid Type-FALB PERITONEAL   Protein, pleural or peritoneal fluid     Status: None   Collection Time: 11/12/20  3:51 PM  Result Value Ref Range   Total protein, fluid 4.3 g/dL   Fluid Type-FTP PERITONEAL   Glucose, pleural or peritoneal fluid     Status: None   Collection Time: 11/12/20  3:51 PM  Result Value Ref Range   Glucose, Fluid 170 mg/dL   Fluid Type-FGLU PERITONEAL   Culture, body fluid w Gram Stain-bottle     Status: None (Preliminary result)   Collection Time: 11/12/20  3:51 PM   Specimen: Fluid  Result Value Ref Range   Specimen Description FLUID PERITONEAL ABDOMEN    Special Requests NONE    Culture      NO GROWTH < 24 HOURS Performed at [facility], [address] [place], [place] [address]    Report Status PENDING   Gram stain     Status: None (Preliminary result)   Collection Time: 11/12/20  3:51 PM   Specimen: Fluid  Result Value Ref Range   Specimen Description FLUID PERITONEAL    Special Requests      ABDOMEN Performed at [facility], [address], [place], [place] [address]    Gram Stain PENDING    Report Status PENDING   Pathologist smear review     Status: None   Collection Time: 11/12/20  3:51 PM  Result Value Ref Range   Path Review          Glucose, capillary     Status: Abnormal   Collection Time: 11/12/20  4:57 PM  Result Value Ref Range   Glucose-Capillary 199 (H) 70 - 99 mg/dL  Glucose, capillary     Status: Abnormal   Collection Time: 11/12/20  9:11 PM  Result Value Ref Range   Glucose-Capillary 167 (H) 70 - 99 mg/dL  CBC with Differential/Platelet     Status: Abnormal   Collection Time: 11/13/20  1:24 AM  Result Value Ref Range   WBC 5.3 4.0 - 10.5 K/uL   RBC 2.84 (L) 4.22 - 5.81 MIL/uL    Hemoglobin 8.7 (L) 13.0 - 17.0  g/dL   HCT 63.8 (L) 46.6 - 59.9 %   MCV 95.1 80.0 - 100.0 fL   MCH 30.6 26.0 - 34.0 pg   MCHC 32.2 30.0 - 36.0 g/dL   RDW 35.7 01.7 - 79.3 %   Platelets 198 150 - 400 K/uL   nRBC 0.0 0.0 - 0.2 %   Neutrophils Relative % 90 %   Neutro Abs 4.8 1.7 - 7.7 K/uL   Lymphocytes Relative 6 %   Lymphs Abs 0.3 (L) 0.7 - 4.0 K/uL   Monocytes Relative 4 %   Monocytes Absolute 0.2 0.1 - 1.0 K/uL   Eosinophils Relative 0 %   Eosinophils Absolute 0.0 0.0 - 0.5 K/uL   Basophils Relative 0 %   Basophils Absolute 0.0 0.0 - 0.1 K/uL   Immature Granulocytes 0 %   Abs Immature Granulocytes 0.02 0.00 - 0.07 K/uL  Comprehensive metabolic panel     Status: Abnormal   Collection Time: 11/13/20  1:24 AM  Result Value Ref Range   Sodium 131 (L) 135 - 145 mmol/L   Potassium 4.3 3.5 - 5.1 mmol/L   Chloride 98 98 - 111 mmol/L   CO2 25 22 - 32 mmol/L   Glucose, Bld 169 (H) 70 - 99 mg/dL   BUN 25 (H) 8 - 23 mg/dL   Creatinine, Ser 9.03 (H) 0.61 - 1.24 mg/dL   Calcium 8.5 (L) 8.9 - 10.3 mg/dL   Total Protein 5.9 (L) 6.5 - 8.1 g/dL   Albumin 2.6 (L) 3.5 - 5.0 g/dL   AST 13 (L) 15 - 41 U/L   ALT 10 0 - 44 U/L   Alkaline Phosphatase 83 38 - 126 U/L   Total Bilirubin 1.0 0.3 - 1.2 mg/dL   GFR, Estimated 52 (L) >60 mL/min   Anion gap 8 5 - 15  C-reactive protein     Status: None   Collection Time: 11/13/20  1:24 AM  Result Value Ref Range   CRP 0.8 <1.0 mg/dL  D-dimer, quantitative     Status: Abnormal   Collection Time: 11/13/20  1:24 AM  Result Value Ref Range   D-Dimer, Quant 7.88 (H) 0.00 - 0.50 ug/mL-FEU  Magnesium     Status: None   Collection Time: 11/13/20  1:24 AM  Result Value Ref Range   Magnesium 1.9 1.7 - 2.4 mg/dL  Brain natriuretic peptide     Status: Abnormal   Collection Time: 11/13/20  1:24 AM  Result Value Ref Range   B Natriuretic Peptide 442.0 (H) 0.0 - 100.0 pg/mL  TSH     Status: None   Collection Time: 11/13/20  1:24 AM  Result Value Ref Range    TSH 3.036 0.350 - 4.500 uIU/mL  Glucose, capillary     Status: Abnormal   Collection Time: 11/13/20  7:27 AM  Result Value Ref Range   Glucose-Capillary 129 (H) 70 - 99 mg/dL  Glucose, capillary     Status: Abnormal   Collection Time: 11/13/20 11:45 AM  Result Value Ref Range   Glucose-Capillary 135 (H) 70 - 99 mg/dL     DG Chest Port 1 View  Result Date: 11/12/2020 CLINICAL DATA:  Shortness of breath. EXAM: PORTABLE CHEST 1 VIEW COMPARISON:  November 10, 2020. FINDINGS: Stable cardiomegaly. No pneumothorax or pleural effusion is noted. Lungs are clear. Bony thorax is unremarkable. IMPRESSION: No active disease. Electronically Signed   By: Lupita Raider M.D.   On: 11/12/2020 09:10   IR Paracentesis  Result  Date: 11/12/2020 INDICATION: Patient with history of COVID 19 pneumonia, coronary artery disease with prior CABG, cirrhosis by imaging, ascites; request received for diagnostic and therapeutic paracentesis up to 5 liters. EXAM: ULTRASOUND GUIDED DIAGNOSTIC AND THERAPEUTIC  PARACENTESIS MEDICATIONS: 1% lidocaine to skin and SQ tissue COMPLICATIONS: None immediate. PROCEDURE: Informed written consent was obtained from the patient after a discussion of the risks, benefits and alternatives to treatment. A timeout was performed prior to the initiation of the procedure. Initial ultrasound scanning demonstrates a large amount of ascites within the left lower abdominal quadrant. The left lower abdomen was prepped and draped in the usual sterile fashion. 1% lidocaine was used for local anesthesia. Following this, a 19 gauge, 10-cm, Yueh catheter was introduced. An ultrasound image was saved for documentation purposes. The paracentesis was performed. The catheter was removed and a dressing was applied. The patient tolerated the procedure well without immediate post procedural complication. Patient received post-procedure intravenous albumin; see nursing notes for details. FINDINGS: A total of approximately 5  liters of yellow fluid was removed. Samples were sent to the laboratory as requested by the clinical team. IMPRESSION: Successful ultrasound-guided diagnostic and therapeutic paracentesis yielding 5 liters of peritoneal fluid. Read by: Artemio AlyKevin Allred,PA-C Electronically Signed   By: Marliss Cootsylan  Suttle MD   On: 11/12/2020 15:38    ROS:  As stated above in the HPI otherwise negative.  Blood pressure 129/70, pulse 61, temperature 98 F (36.7 C), temperature source Oral, resp. rate 18, height 6\' 1"  (1.854 m), weight (!) 138 kg, SpO2 95 %.    PE: Gen: NAD, Alert and Oriented HEENT:  [place]/AT, EOMI Neck: Supple, no LAD Lungs: CTA Bilaterally CV: RRR without M/G/R ABD: Soft, NT, +BS, markedly distended with ascites, but it is not tense. Ext: No C/C/E  Assessment/Plan: 1) Ascites. 2) Cirrhosis. 3) Afib. 4) COVID. 5) Diastolic CHF. 6) Pulmonary HTN. 7) DM (last A1C on 05/13/2020 was 5.5%). 8) Renal insufficiency.   The low SAAG with his clinical presentation is difficult to interpret.  The suspicion is that he truly does have portal HTN.  Malignancy, pancreatitis, TB do not appear to be the source of his ascites as he does not clinically fit into these categories.  If malignancy was an issue he would be expected to be in a worse clinical status.  A cardiogenic source is a possibility with his preserved TP, but his SAAG was less than 1.1.  Nephrotic syndrome is entertained, but is values are opposite of in that his SAAG is <1.1, but his TP is not decreased.  He is to undergo a repeat paracentesis today.  Plan: 1) Recheck albumin and TP for a recalculation of his SAAG.  If his SAAG is >1.1 and the TP is normal this is a cardiogenic source. 2) Full abdominal imaging and possibly a 24 hour urine collection may be required if his SAAG remains <1.1. 3) Dr. Leone PayorGessner will follow the patient this weekend. Desiree Daise D 11/13/2020, 2:35 PM

## 2020-11-13 NOTE — Progress Notes (Addendum)
PROGRESS NOTE                                                                                                                                                                                                             Patient Demographics:    Paul Mclaughlin, is a 65 y.o. male, DOB - June 08, 1956, ZOX:[id number]  Outpatient Primary MD for the patient is [name], MD    LOS - 2  Admit date - 11/10/2020    Chief Complaint  Patient presents with  . Shortness of Breath  . Congestive Heart Failure       Brief Narrative (HPI from H&P)   this is a 65 year old Caucasian gentleman with history of VSD, CAD s/p CABG, hypertension, dyslipidemia, paroxysmal A. fib on Xarelto for anticoagulation who has taken 2 shots of COVID-19 mRNA vaccine so far presents to the hospital with 7 to 10-day history of progressive shortness of breath and some increasing edema, he was in the ER and was diagnosed with acute COVID-19 infection/pneumonia along with acute on chronic CHF exacerbation.  He was also noted to have abdominal distention with possible ascites causing shortness of breath   Subjective:   Patient sitting in bed, feels much better after paracentesis yesterday, no chest or abdominal pain, improved shortness of breath.  Currently on room air   Assessment  & Plan :     1. Acute Hypoxic Resp. Failure due to Acute Covid 19 Viral Pneumonitis long with possible acute on chronic diastolic CHF percent and massive ascites causing diaphragmatic compression- he is vaccinated and could have had mild COVID-19 pneumonia, will place him on moderate dose IV steroids along with remdesivir, monitor inflammatory marker.  Has been diuresed with Lasix, I think there is some element of undiagnosed ascites which is contributing to his shortness of breath as well, kindly see #8 below.  Encouraged the patient to sit up in chair in the daytime use I-S and flutter  valve for pulmonary toiletry and then prone in bed when at night.  Will advance activity and titrate down oxygen as possible.  SpO2: 94 % O2 Flow Rate (L/min): 2 L/min  Recent Labs  Lab 11/10/20 1722 11/10/20 1946 11/10/20 2241 11/11/20 0404 11/11/20 0745 11/12/20 0134 11/13/20 0124  WBC 3.3*  --   --  2.9*  --  4.3 5.3  HGB 9.4*  --   --  9.6*  --  9.1* 8.7*  HCT 28.7*  --   --  30.7*  --  29.1* 27.0*  PLT 252  --   --  235  --  234 198  CRP  --   --   --   --  1.2* 1.2* 0.8  BNP  --  693.0*  --   --   --  395.2* 442.0*  DDIMER  --   --   --   --  8.35* 8.64* 7.88*  AST  --  20  --  18  --  21 13*  ALT  --  11  --  11  --  11 10  ALKPHOS  --  113  --  98  --  102 83  BILITOT  --  1.7*  --  1.6*  --  1.3* 1.0  ALBUMIN  --  3.3*  --  3.1*  --  3.0* 2.6*  SARSCOV2NAA  --   --  POSITIVE*  --   --   --   --     2. CAD s/p CABG. Currently chest pain-free no acute issues, has history of VSD. Currently stable no acute issues, continue statin, Xarelto echo shows preserved EF, discontinue ACE for now as he has developed mild AKI.  Only on Norvasc and hydralazine will change medications to beta-blocker and low-dose hydralazine.  3. Paroxysmal A. fib. [place] vas 2 score of greater than 3.  Continue Xarelto now on Coreg as well.  4. Hypretension.  Blood pressure slightly soft for now only Coreg and monitor.  Hold Norvasc and hydralazine..  5. Dyslipidemia on statin.  6. GERD. PPI.  7.  AKI.  Hold further diuretics, hold ACE inhibitor, avoid hypotension, blood pressure medications adjusted as well, since he is undergoing large volume paracentesis will hold further diuretics.  8.  New diagnosis of cirrhosis and ascites, he underwent ultrasound-guided paracentesis on 11/12/2020 with 5 L of ascitic fluid removed, no signs of infection, will repeat therapeutic draw again on 11/13/2020.  Cute hepatitis panel thus far negative, will require outpatient GI follow-up post discharge.  Will start him on  diuretic regimen once creatinine has stabilized and large-volume paracentesis has been done, and if he has portal condition due to underlying heart failure.  9. DM2 - continue Farxiga, placed on moderate dose ISS along with Premeal NovoLog, monitor in presence of steroids  Lab Results  Component Value Date   HGBA1C 5.3 11/10/2020   CBG (last 3)  Recent Labs    11/12/20 1657 11/12/20 2111 11/13/20 0727  GLUCAP 199* 167* 129*        Condition -   Guarded  Family Communication  : Patient will keep his family and friends updated himself, no immediate family around.  Code Status :  Full  Consults  :  None  PUD Prophylaxis : PPI   Procedures  :     Ultrasound-guided paracentesis with 5 L of fluid removed on 11/12/2020.  No signs of SBP, SAAG score surprisingly is under 1   right upper quadrant ultrasound.  Possible cirrhosis.  TTE - 1. Left ventricular ejection fraction, by estimation, is 55%. The left ventricle has normal function. The left ventricle has no regional wall motion abnormalities. Left ventricular diastolic parameters are indeterminate.  2. Right ventricular systolic function is moderately reduced. The right ventricular size is severely enlarged. There is severely elevated pulmonary artery systolic pressure. The estimated right ventricular  systolic pressure is 73.7 mmHg. D-shaped interventricular septum suggestive of RV pressure/volume overload.  3. Left atrial size was moderately dilated.  4. Right atrial size was moderately dilated.  5. The mitral valve is normal in structure. Trivial mitral valve regurgitation. No evidence of mitral stenosis.  6. Tricuspid valve regurgitation is mild to moderate.  7. The aortic valve is tricuspid. Aortic valve regurgitation is not visualized. Mild aortic valve sclerosis is present, with no evidence of aortic valve stenosis.  8. The inferior vena cava is dilated in size with <50% respiratory variability, suggesting right atrial pressure of  15 mmHg.  9. Prominent ascites noted. 10. The patient was in atrial fibrillation.       Disposition Plan  :    Status is: Inpatient  Dispo: The patient is from: Home              Anticipated d/c is to: Home              Patient currently is not medically stable to d/c.   Difficult to place patient No  DVT Prophylaxis  :  Xaralto  Lab Results  Component Value Date   PLT 198 11/13/2020    Diet :  Diet Order            Diet heart healthy/carb modified Room service appropriate? Yes; Fluid consistency: Thin  Diet effective now                  Inpatient Medications  Scheduled Meds: . albuterol  2 puff Inhalation Q6H  . vitamin C  500 mg Oral Daily  . atorvastatin  20 mg Oral Daily  . dapagliflozin propanediol  5 mg Oral QAC breakfast  . insulin aspart  0-15 Units Subcutaneous TID WC  . insulin aspart  0-5 Units Subcutaneous QHS  . insulin aspart  4 Units Subcutaneous TID WC  . loratadine  10 mg Oral Daily  . mouth rinse  15 mL Mouth Rinse BID  . methylPREDNISolone (SOLU-MEDROL) injection  40 mg Intravenous Daily  . pantoprazole  40 mg Oral Daily  . rivaroxaban  20 mg Oral Q supper  . zinc sulfate  220 mg Oral Daily   Continuous Infusions: . albumin human    . remdesivir 100 mg in NS 100 mL 100 mg (11/13/20 0810)   PRN Meds:.acetaminophen, albumin human, albuterol, chlorpheniramine-HYDROcodone, lidocaine, ondansetron (ZOFRAN) IV  Antibiotics  :    Anti-infectives (From admission, onward)   Start     Dose/Rate Route Frequency Ordered Stop   11/12/20 1000  remdesivir 100 mg in sodium chloride 0.9 % 100 mL IVPB  Status:  Discontinued       "Followed by" Linked Group Details   100 mg 200 mL/hr over 30 Minutes Intravenous Daily 11/11/20 0442 11/11/20 0822   11/12/20 1000  remdesivir 100 mg in sodium chloride 0.9 % 100 mL IVPB       "Followed by" Linked Group Details   100 mg 200 mL/hr over 30 Minutes Intravenous Daily 11/11/20 0822 11/16/20 0959   11/11/20 0500   remdesivir 200 mg in sodium chloride 0.9% 250 mL IVPB       "Followed by" Linked Group Details   200 mg 580 mL/hr over 30 Minutes Intravenous Once 11/11/20 0442 11/11/20 0726       Time Spent in minutes  30   Susa Raring M.D on 11/13/2020 at 9:50 AM  To page go to [email or web address]   [facility] -  Office  [phone]    See all Orders from today for further details    Objective:   Vitals:   11/12/20 1739 11/12/20 2115 11/13/20 0557 11/13/20 0600  BP: 136/68 134/72 (!) 107/48   Pulse: (!) 53  (!) 51   Resp:  18 19   Temp:  97.8 F (36.6 C) 98 F (36.7 C)   TempSrc:  Oral Oral   SpO2:  96% (!) 89% 94%  Weight:      Height:        Wt Readings from Last 3 Encounters:  11/12/20 (!) 138 kg  11/10/20 (!) 138.8 kg  05/13/20 115.7 kg     Intake/Output Summary (Last 24 hours) at 11/13/2020 0950 Last data filed at 11/13/2020 0811 Gross per 24 hour  Intake 951.67 ml  Output 1875 ml  Net -923.33 ml     Physical Exam  Awake Alert, No new F.N deficits, Normal affect [place].AT,PERRAL Supple Neck,No JVD, No cervical lymphadenopathy appriciated.  Symmetrical Chest wall movement, Good air movement bilaterally, CTAB RRR,No Gallops, Rubs or new Murmurs, No Parasternal Heave +ve B.Sounds, Abd is distended with ascites No Cyanosis, 1+ edema- TEDs     Data Review:    CBC Recent Labs  Lab 11/10/20 1722 11/10/20 1946 11/11/20 0404 11/12/20 0134 11/13/20 0124  WBC 3.3*  --  2.9* 4.3 5.3  HGB 9.4*  --  9.6* 9.1* 8.7*  HCT 28.7*  --  30.7* 29.1* 27.0*  PLT 252  --  235 234 198  MCV 96.6  --  98.7 97.0 95.1  MCH 31.6  --  30.9 30.3 30.6  MCHC 32.8  --  31.3 31.3 32.2  RDW 15.5  --  15.4 15.2 14.6  LYMPHSABS  --  0.4* 0.4* 0.2* 0.3*  MONOABS  --  0.3 0.3 0.1 0.2  EOSABS  --  0.1 0.1 0.0 0.0  BASOSABS  --  0.0 0.0 0.0 0.0    Recent Labs  Lab 11/10/20 1610 11/10/20 1722 11/10/20 1946 11/11/20 0404 11/11/20 0745 11/12/20 0134 11/13/20 0124  NA  --  137   --  136  --  135 131*  K  --  3.8  --  3.6  --  4.4 4.3  CL  --  101  --  100  --  99 98  CO2  --  26  --  26  --  26 25  GLUCOSE  --  119*  --  163*  --  142* 169*  BUN  --  21  --  20  --  22 25*  CREATININE  --  1.47*  --  1.43*  --  1.67* 1.50*  CALCIUM  --  8.7*  --  8.7*  --  8.6* 8.5*  AST  --   --  20 18  --  21 13*  ALT  --   --  11 11  --  11 10  ALKPHOS  --   --  113 98  --  102 83  BILITOT  --   --  1.7* 1.6*  --  1.3* 1.0  ALBUMIN  --   --  3.3* 3.1*  --  3.0* 2.6*  MG  --   --   --  1.9  --  2.1 1.9  CRP  --   --   --   --  1.2* 1.2* 0.8  DDIMER  --   --   --   --  8.35* 8.64* 7.88*  TSH  --   --   --   --  4.321  --  3.036  HGBA1C 5.3  --   --   --   --   --   --   BNP  --   --  693.0*  --   --  395.2* 442.0*   ------------------------------------------------------------------------------------------------------------------ No results for input(s): CHOL, HDL, LDLCALC, TRIG, CHOLHDL, LDLDIRECT in the last 72 hours.  Lab Results  Component Value Date   HGBA1C 5.3 11/10/2020   ------------------------------------------------------------------------------------------------------------------ Recent Labs    11/13/20 0124  TSH 3.036    Cardiac Enzymes No results for input(s): CKMB, TROPONINI, MYOGLOBIN in the last 168 hours.  Invalid input(s): CK ------------------------------------------------------------------------------------------------------------------    Component Value Date/Time   BNP 442.0 (H) 11/13/2020 0124   BNP 301.0 (H) 11/24/2016 1150    Micro Results Recent Results (from the past 240 hour(s))  Resp Panel by RT-PCR (Flu A&B, Covid) Nasopharyngeal Swab     Status: Abnormal   Collection Time: 11/10/20 10:41 PM   Specimen: Nasopharyngeal Swab; Nasopharyngeal(NP) swabs in vial transport medium  Result Value Ref Range Status   SARS Coronavirus 2 by RT PCR POSITIVE (A) NEGATIVE Final    Comment: RESULT CALLED TO, READ BACK BY AND VERIFIED WITH: E  SULLIVAN RN 0011 11/11/20 A BROWNING (NOTE) SARS-CoV-2 target nucleic acids are DETECTED.  The SARS-CoV-2 RNA is generally detectable in upper respiratory specimens during the acute phase of infection. Positive results are indicative of the presence of the identified virus, but do not rule out bacterial infection or co-infection with other pathogens not detected by the test. Clinical correlation with patient history and other diagnostic information is necessary to determine patient infection status. The expected result is Negative.  Fact Sheet for Patients: BloggerCourse.comhttps://www.fda.gov/media/152166/download  Fact Sheet for Healthcare Providers: SeriousBroker.ithttps://www.fda.gov/media/152162/download  This test is not yet approved or cleared by the [place] FDA and  has been authorized for detection and/or diagnosis of SARS-CoV-2 by FDA under an Emergency Use Authorization (EUA).  This EUA will remain in effect (meaning this test can  be used) for the duration of  the COVID-19 declaration under Section 564(b)(1) of the Act, 21 U.S.C. section 360bbb-3(b)(1), unless the authorization is terminated or revoked sooner.     Influenza A by PCR NEGATIVE NEGATIVE Final   Influenza B by PCR NEGATIVE NEGATIVE Final    Comment: (NOTE) The Xpert Xpress SARS-CoV-2/FLU/RSV plus assay is intended as an aid in the diagnosis of influenza from Nasopharyngeal swab specimens and should not be used as a sole basis for treatment. Nasal washings and aspirates are unacceptable for Xpert Xpress SARS-CoV-2/FLU/RSV testing.  Fact Sheet for Patients: BloggerCourse.comhttps://www.fda.gov/media/152166/download  Fact Sheet for Healthcare Providers: SeriousBroker.ithttps://www.fda.gov/media/152162/download  This test is not yet approved or cleared by the [place] FDA and has been authorized for detection and/or diagnosis of SARS-CoV-2 by FDA under an Emergency Use Authorization (EUA). This EUA will remain in effect (meaning this test can be used) for  the duration of the COVID-19 declaration under Section 564(b)(1) of the Act, 21 U.S.C. section 360bbb-3(b)(1), unless the authorization is terminated or revoked.  Performed at [facility], [address] [place], [place] [address]   Culture, body fluid w Gram Stain-bottle     Status: None (Preliminary result)   Collection Time: 11/12/20  3:51 PM   Specimen: Fluid  Result Value Ref Range Status   Specimen Description FLUID PERITONEAL ABDOMEN  Final   Special Requests NONE  Final   Culture   Final    NO GROWTH < 24 HOURS Performed  at [facility], [address], [place], [place] [address]    Report Status PENDING  Incomplete  Gram stain     Status: None (Preliminary result)   Collection Time: 11/12/20  3:51 PM   Specimen: Fluid  Result Value Ref Range Status   Specimen Description FLUID PERITONEAL  Final   Special Requests   Final    ABDOMEN Performed at [facility], [address], [place], [place] [address]    Gram Stain PENDING  Incomplete   Report Status PENDING  Incomplete    Radiology Reports DG Chest 2 View  Result Date: 11/10/2020 CLINICAL DATA:  Short of breath EXAM: CHEST - 2 VIEW COMPARISON:  03/30/2017 FINDINGS: Mild cardiomegaly with central vascular congestion. No overt edema or pleural effusion. No consolidation or pneumothorax. Old fracture deformity of the left clavicle. IMPRESSION: Cardiomegaly with mild central vascular congestion. Electronically Signed   By: Jasmine Pang M.D.   On: 11/10/2020 17:48   DG Chest Port 1 View  Result Date: 11/12/2020 CLINICAL DATA:  Shortness of breath. EXAM: PORTABLE CHEST 1 VIEW COMPARISON:  November 10, 2020. FINDINGS: Stable cardiomegaly. No pneumothorax or pleural effusion is noted. Lungs are clear. Bony thorax is unremarkable. IMPRESSION: No active disease. Electronically Signed   By: Lupita Raider M.D.   On: 11/12/2020 09:10   ECHOCARDIOGRAM COMPLETE  Result Date: 11/11/2020    ECHOCARDIOGRAM REPORT    Patient Name:   Paul Mclaughlin Date of Exam: 11/11/2020 Medical Rec #:  [id number]       Height:       73.0 in Accession #:    [id number]      Weight:       306.0 lb Date of Birth:  Sep 20, 1955       BSA:          2.578 m Patient Age:    64 years        BP:           134/74 mmHg Patient Gender: M               HR:           47 bpm. Exam Location:  Inpatient Procedure: 2D Echo, Cardiac Doppler and Color Doppler Indications:    I50.33 Acute on chronic diastolic (congestive) heart failure  History:        Patient has prior history of Echocardiogram examinations, most                 recent 11/28/2016. CHF, Arrythmias:Atrial Fibrillation,                 Signs/Symptoms:Dyspnea; Risk Factors:Diabetes. COVID-19                 Positive.  Sonographer:    Elmarie Shiley Dance Referring Phys: [id number] Deno Lunger [place] IMPRESSIONS  1. Left ventricular ejection fraction, by estimation, is 55%. The left ventricle has normal function. The left ventricle has no regional wall motion abnormalities. Left ventricular diastolic parameters are indeterminate.  2. Right ventricular systolic function is moderately reduced. The right ventricular size is severely enlarged. There is severely elevated pulmonary artery systolic pressure. The estimated right ventricular systolic pressure is 73.7 mmHg. D-shaped interventricular septum suggestive of RV pressure/volume overload.  3. Left atrial size was moderately dilated.  4. Right atrial size was moderately dilated.  5. The mitral valve is normal in structure. Trivial mitral valve regurgitation. No evidence of mitral stenosis.  6. Tricuspid  valve regurgitation is mild to moderate.  7. The aortic valve is tricuspid. Aortic valve regurgitation is not visualized. Mild aortic valve sclerosis is present, with no evidence of aortic valve stenosis.  8. The inferior vena cava is dilated in size with <50% respiratory variability, suggesting right atrial pressure of 15 mmHg.  9. Prominent ascites noted. 10. The  patient was in atrial fibrillation. FINDINGS  Left Ventricle: Left ventricular ejection fraction, by estimation, is 55%. The left ventricle has normal function. The left ventricle has no regional wall motion abnormalities. The left ventricular internal cavity size was normal in size. There is no left ventricular hypertrophy. Left ventricular diastolic parameters are indeterminate. Right Ventricle: The right ventricular size is severely enlarged. No increase in right ventricular wall thickness. Right ventricular systolic function is moderately reduced. There is severely elevated pulmonary artery systolic pressure. The tricuspid regurgitant velocity is 3.83 m/s, and with an assumed right atrial pressure of 15 mmHg, the estimated right ventricular systolic pressure is 73.7 mmHg. Left Atrium: Left atrial size was moderately dilated. Right Atrium: Right atrial size was moderately dilated. Pericardium: There is no evidence of pericardial effusion. Mitral Valve: The mitral valve is normal in structure. Mild mitral annular calcification. Trivial mitral valve regurgitation. No evidence of mitral valve stenosis. Tricuspid Valve: The tricuspid valve is normal in structure. Tricuspid valve regurgitation is mild to moderate. Aortic Valve: The aortic valve is tricuspid. Aortic valve regurgitation is not visualized. Mild aortic valve sclerosis is present, with no evidence of aortic valve stenosis. Pulmonic Valve: The pulmonic valve was normal in structure. Pulmonic valve regurgitation is trivial. Aorta: The aortic root is normal in size and structure. Venous: The inferior vena cava is dilated in size with less than 50% respiratory variability, suggesting right atrial pressure of 15 mmHg. IAS/Shunts: No atrial level shunt detected by color flow Doppler.  LEFT VENTRICLE PLAX 2D LVIDd:         5.00 cm LVIDs:         2.70 cm LV PW:         1.50 cm LV IVS:        0.90 cm LVOT diam:     2.00 cm LV SV:         86 LV SV Index:   33 LVOT  Area:     3.14 cm  RIGHT VENTRICLE          IVC RV Basal diam:  3.90 cm  IVC diam: 3.30 cm RV Mid diam:    2.60 cm TAPSE (M-mode): 1.8 cm LEFT ATRIUM              Index       RIGHT ATRIUM           Index LA diam:        5.40 cm  2.09 cm/m  RA Area:     27.40 cm LA Vol (A2C):   119.0 ml 46.16 ml/m RA Volume:   92.90 ml  36.04 ml/m LA Vol (A4C):   102.0 ml 39.57 ml/m LA Biplane Vol: 119.0 ml 46.16 ml/m  AORTIC VALVE LVOT Vmax:   119.50 cm/s LVOT Vmean:  78.700 cm/s LVOT VTI:    0.272 m  AORTA Ao Root diam: 3.50 cm Ao Asc diam:  3.20 cm MITRAL VALVE                TRICUSPID VALVE MV Area (PHT): 3.00 cm     TR Peak grad:   58.7 mmHg MV Decel  Time: 253 msec     TR Vmax:        383.00 cm/s MV E velocity: 105.00 cm/s MV A velocity: 36.10 cm/s   SHUNTS MV E/A ratio:  2.91         Systemic VTI:  0.27 m                             Systemic Diam: 2.00 cm Marca Ancona MD Electronically signed by Marca Ancona MD Signature Date/Time: 11/11/2020/4:21:17 PM    Final    US Abdomen Limited RUQ (LIVER/GB)  Result Date: 11/11/2020 CLINICAL DATA:  Cirrhosis.  Ascites.  Abdominal distention. EXAM: ULTRASOUND ABDOMEN LIMITED RIGHT UPPER QUADRANT COMPARISON:  No prior. FINDINGS: Gallbladder: No gallstones or wall thickening visualized. No sonographic Murphy sign noted by sonographer. Common bile duct: Diameter: 5.4 mm Liver: Increased echogenicity and nodular irregular cortex consistent with cirrhosis. No focal hepatic abnormality identified. Portal vein is patent on color Doppler imaging with normal direction of blood flow towards the liver. Other: Prominent ascites. IMPRESSION: 1. No gallstones or biliary distention. 2. Increased hepatic echogenicity and nodular hepatic cortex consistent with cirrhosis. No focal hepatic abnormality identified. 3. Prominent ascites. Electronically Signed   By: Maisie Fus  Register   On: 11/11/2020 06:58   IR Paracentesis  Result Date: 11/12/2020 INDICATION: Patient with history of COVID 19  pneumonia, coronary artery disease with prior CABG, cirrhosis by imaging, ascites; request received for diagnostic and therapeutic paracentesis up to 5 liters. EXAM: ULTRASOUND GUIDED DIAGNOSTIC AND THERAPEUTIC  PARACENTESIS MEDICATIONS: 1% lidocaine to skin and SQ tissue COMPLICATIONS: None immediate. PROCEDURE: Informed written consent was obtained from the patient after a discussion of the risks, benefits and alternatives to treatment. A timeout was performed prior to the initiation of the procedure. Initial ultrasound scanning demonstrates a large amount of ascites within the left lower abdominal quadrant. The left lower abdomen was prepped and draped in the usual sterile fashion. 1% lidocaine was used for local anesthesia. Following this, a 19 gauge, 10-cm, Yueh catheter was introduced. An ultrasound image was saved for documentation purposes. The paracentesis was performed. The catheter was removed and a dressing was applied. The patient tolerated the procedure well without immediate post procedural complication. Patient received post-procedure intravenous albumin; see nursing notes for details. FINDINGS: A total of approximately 5 liters of yellow fluid was removed. Samples were sent to the laboratory as requested by the clinical team. IMPRESSION: Successful ultrasound-guided diagnostic and therapeutic paracentesis yielding 5 liters of peritoneal fluid. Read by: Artemio Aly Electronically Signed   By: Marliss Coots MD   On: 11/12/2020 15:38

## 2020-11-13 NOTE — Procedures (Signed)
PROCEDURE SUMMARY:  Successful image-guided paracentesis from the right lower abdomen.  Yielded 6 liters of clear yellow fluid.  No immediate complications.  EBL = trace. Patient tolerated well.   Specimen was not sent for labs.  Please see imaging section of Epic for full dictation.   Lynann Bologna Han PA-C 11/13/2020 3:43 PM

## 2020-11-14 ENCOUNTER — Inpatient Hospital Stay (HOSPITAL_COMMUNITY): Payer: Medicaid Other

## 2020-11-14 DIAGNOSIS — R7989 Other specified abnormal findings of blood chemistry: Secondary | ICD-10-CM

## 2020-11-14 DIAGNOSIS — I50810 Right heart failure, unspecified: Secondary | ICD-10-CM

## 2020-11-14 DIAGNOSIS — J90 Pleural effusion, not elsewhere classified: Secondary | ICD-10-CM | POA: Diagnosis not present

## 2020-11-14 DIAGNOSIS — R0602 Shortness of breath: Secondary | ICD-10-CM | POA: Diagnosis not present

## 2020-11-14 DIAGNOSIS — R932 Abnormal findings on diagnostic imaging of liver and biliary tract: Secondary | ICD-10-CM | POA: Diagnosis not present

## 2020-11-14 DIAGNOSIS — U071 COVID-19: Secondary | ICD-10-CM

## 2020-11-14 DIAGNOSIS — Z8619 Personal history of other infectious and parasitic diseases: Secondary | ICD-10-CM | POA: Diagnosis not present

## 2020-11-14 DIAGNOSIS — R609 Edema, unspecified: Secondary | ICD-10-CM

## 2020-11-14 DIAGNOSIS — R188 Other ascites: Secondary | ICD-10-CM | POA: Diagnosis not present

## 2020-11-14 DIAGNOSIS — I5033 Acute on chronic diastolic (congestive) heart failure: Secondary | ICD-10-CM | POA: Diagnosis not present

## 2020-11-14 LAB — COMPREHENSIVE METABOLIC PANEL
ALT: 10 U/L (ref 0–44)
AST: 13 U/L — ABNORMAL LOW (ref 15–41)
Albumin: 2.6 g/dL — ABNORMAL LOW (ref 3.5–5.0)
Alkaline Phosphatase: 79 U/L (ref 38–126)
Anion gap: 7 (ref 5–15)
BUN: 26 mg/dL — ABNORMAL HIGH (ref 8–23)
CO2: 26 mmol/L (ref 22–32)
Calcium: 8.6 mg/dL — ABNORMAL LOW (ref 8.9–10.3)
Chloride: 97 mmol/L — ABNORMAL LOW (ref 98–111)
Creatinine, Ser: 1.29 mg/dL — ABNORMAL HIGH (ref 0.61–1.24)
GFR, Estimated: >60 mL/min (ref >60)
Glucose, Bld: 161 mg/dL — ABNORMAL HIGH (ref 70–99)
Potassium: 4.2 mmol/L (ref 3.5–5.1)
Sodium: 130 mmol/L — ABNORMAL LOW (ref 135–145)
Total Bilirubin: 0.9 mg/dL (ref 0.3–1.2)
Total Protein: 5.6 g/dL — ABNORMAL LOW (ref 6.5–8.1)

## 2020-11-14 LAB — CBC WITH DIFFERENTIAL/PLATELET
Abs Immature Granulocytes: 0.03 10*3/uL (ref 0.00–0.07)
Basophils Absolute: 0 10*3/uL (ref 0.0–0.1)
Basophils Relative: 0 %
Eosinophils Absolute: 0 10*3/uL (ref 0.0–0.5)
Eosinophils Relative: 0 %
HCT: 26.3 % — ABNORMAL LOW (ref 39.0–52.0)
Hemoglobin: 8.8 g/dL — ABNORMAL LOW (ref 13.0–17.0)
Immature Granulocytes: 1 %
Lymphocytes Relative: 8 %
Lymphs Abs: 0.4 10*3/uL — ABNORMAL LOW (ref 0.7–4.0)
MCH: 31.2 pg (ref 26.0–34.0)
MCHC: 33.5 g/dL (ref 30.0–36.0)
MCV: 93.3 fL (ref 80.0–100.0)
Monocytes Absolute: 0.3 10*3/uL (ref 0.1–1.0)
Monocytes Relative: 5 %
Neutro Abs: 4.6 10*3/uL (ref 1.7–7.7)
Neutrophils Relative %: 86 %
Platelets: 196 10*3/uL (ref 150–400)
RBC: 2.82 MIL/uL — ABNORMAL LOW (ref 4.22–5.81)
RDW: 14.3 % (ref 11.5–15.5)
WBC: 5.3 10*3/uL (ref 4.0–10.5)
nRBC: 0 % (ref 0.0–0.2)

## 2020-11-14 LAB — GLUCOSE, CAPILLARY
Glucose-Capillary: 135 mg/dL — ABNORMAL HIGH (ref 70–99)
Glucose-Capillary: 150 mg/dL — ABNORMAL HIGH (ref 70–99)
Glucose-Capillary: 176 mg/dL — ABNORMAL HIGH (ref 70–99)
Glucose-Capillary: 201 mg/dL — ABNORMAL HIGH (ref 70–99)

## 2020-11-14 LAB — C-REACTIVE PROTEIN: CRP: 0.7 mg/dL (ref <1.0)

## 2020-11-14 LAB — D-DIMER, QUANTITATIVE: D-Dimer, Quant: 6.23 ug/mL-FEU — ABNORMAL HIGH (ref 0.00–0.50)

## 2020-11-14 LAB — MAGNESIUM: Magnesium: 2 mg/dL (ref 1.7–2.4)

## 2020-11-14 LAB — BRAIN NATRIURETIC PEPTIDE: B Natriuretic Peptide: 439.8 pg/mL — ABNORMAL HIGH (ref 0.0–100.0)

## 2020-11-14 MED ORDER — ATROPINE SULFATE 1 MG/10ML IJ SOSY: 0.5000 mg | PREFILLED_SYRINGE | Freq: Once | INTRAMUSCULAR | Status: AC

## 2020-11-14 MED ORDER — FUROSEMIDE 40 MG PO TABS
40.0000 mg | ORAL_TABLET | Freq: Once | ORAL | Status: AC
  Administered 2020-11-14: 40 mg via ORAL
  Filled 2020-11-14: qty 1

## 2020-11-14 MED ORDER — ATROPINE SULFATE 1 MG/10ML IJ SOSY
PREFILLED_SYRINGE | INTRAMUSCULAR | Status: AC
  Administered 2020-11-14: 0.5 mg via INTRAVENOUS
  Filled 2020-11-14: qty 10

## 2020-11-14 NOTE — Progress Notes (Signed)
PROGRESS NOTE                                                                                                                                                                                                             Patient Demographics:    Paul Mclaughlin, is a 65 y.o. male, DOB - [birth year], WUJ:[id number]  Outpatient Primary MD for the patient is [name], MD    LOS - 3  Admit date - 11/10/2020    Chief Complaint  Patient presents with  . Shortness of Breath  . Congestive Heart Failure       Brief Narrative (HPI from H&P)   this is a 65 year old Caucasian gentleman with history of VSD, CAD s/p CABG, hypertension, dyslipidemia, paroxysmal A. fib on Xarelto for anticoagulation who has taken 2 shots of COVID-19 mRNA vaccine so far presents to the hospital with 7 to 10-day history of progressive shortness of breath and some increasing edema, he was in the ER and was diagnosed with acute COVID-19 infection/pneumonia along with acute on chronic CHF exacerbation.  He was also noted to have abdominal distention with possible ascites causing shortness of breath   Subjective:   Patient in bed, appears comfortable, denies any headache, no fever, no chest pain or pressure, no shortness of breath , no abdominal pain. No focal weakness.    Assessment  & Plan :     1. Acute Hypoxic Resp. Failure due to Acute Covid 19 Viral Pneumonitis long with possible acute on chronic diastolic CHF percent and massive ascites causing diaphragmatic compression- he is vaccinated and could have had mild COVID-19 pneumonia, will place him on moderate dose IV steroids along with remdesivir, monitor inflammatory marker.  Has been diuresed with Lasix, I think there is some element of undiagnosed ascites which is contributing to his shortness of breath as well, kindly see #8 below.  Encouraged the patient to sit up in chair in the daytime use I-S and  flutter valve for pulmonary toiletry and then prone in bed when at night.  Will advance activity and titrate down oxygen as possible.  SpO2: 95 % O2 Flow Rate (L/min): 2 L/min  Recent Labs  Lab 11/10/20 1722 11/10/20 1946 11/10/20 2241 11/11/20 0404 11/11/20 0745 11/12/20 0134 11/13/20 0124 11/14/20 0409  WBC 3.3*  --   --  2.9*  --  4.3 5.3 5.3  HGB 9.4*  --   --  9.6*  --  9.1* 8.7* 8.8*  HCT 28.7*  --   --  30.7*  --  29.1* 27.0* 26.3*  PLT 252  --   --  235  --  234 198 196  CRP  --   --   --   --  1.2* 1.2* 0.8 0.7  BNP  --  693.0*  --   --   --  395.2* 442.0* 439.8*  DDIMER  --   --   --   --  8.35* 8.64* 7.88* 6.23*  AST  --  20  --  18  --  21 13* 13*  ALT  --  11  --  11  --  ALKPHOS  --  113  --  98  --  102 83 79  BILITOT  --  1.7*  --  1.6*  --  1.3* 1.0 0.9  ALBUMIN  --  3.3*  --  3.1*  --  3.0* 2.6* 2.6*  SARSCOV2NAA  --   --  POSITIVE*  --   --   --   --   --     2. CAD s/p CABG. Currently chest pain-free no acute issues, has history of VSD. Currently stable no acute issues, continue statin, Xarelto echo shows preserved EF, discontinue ACE for now as he has developed mild AKI.  Only on Norvasc and hydralazine will change medications to beta-blocker and low-dose hydralazine.  3. Paroxysmal A. fib. [place] vas 2 score of greater than 3.  Continue Xarelto, has A. fib with low ventricular response hence no beta-blocker.  Stable TSH.  4. New diagnosis of cirrhosis and ascites, he underwent ultrasound-guided paracentesis on 11/12/2020 with 5 L of ascitic fluid removed, no signs of infection, had another 6 L removed with a ultrasound-guided therapeutic draw on 11/13/2020. Acute hepatitis panel thus far negative, GI has been consulted and seen, will order another therapeutic and diagnostic paracentesis on 11/15/2020 as blood pressure is slightly on the softer side today, initial SAAG is under 1.  If repeat draw suggests SAAG of 1-1 then his ascites could be due to  cardiogenic portal congestion.  5. Dyslipidemia on statin.  6. GERD. PPI.  7.  AKI.  Cautious with diuretics, hold ACE inhibitor, avoid hypotension, blood pressure medications adjusted as well, since he is undergoing large volume paracentesis will hold further diuretics.  8. Hypretension.  Blood pressure soft currently off of blood pressure medications except for low-dose diuretics as tolerated.  9. DM2 - continue Farxiga, placed on moderate dose ISS along with Premeal NovoLog, monitor in presence of steroids  Lab Results  Component Value Date   HGBA1C 5.3 11/10/2020   CBG (last 3)  Recent Labs    11/13/20 1752 11/13/20 2043 11/14/20 0715  GLUCAP 165* 191* 135*        Condition -   Guarded  Family Communication  : Patient will keep his family and friends updated himself, no immediate family around.  Code Status :  Full  Consults  :  None  PUD Prophylaxis : PPI   Procedures  :     Therapeutic paracentesis 11/13/2020 with 6 L of fluid removed.    Ultrasound-guided paracentesis with 5 L of fluid removed on 11/12/2020.  No signs of SBP, SAAG score surprisingly is under 1   right upper quadrant ultrasound.  Possible cirrhosis.  TTE - 1. Left ventricular ejection  fraction, by estimation, is 55%. The left ventricle has normal function. The left ventricle has no regional wall motion abnormalities. Left ventricular diastolic parameters are indeterminate.  2. Right ventricular systolic function is moderately reduced. The right ventricular size is severely enlarged. There is severely elevated pulmonary artery systolic pressure. The estimated right ventricular systolic pressure is 73.7 mmHg. D-shaped interventricular septum suggestive of RV pressure/volume overload.  3. Left atrial size was moderately dilated.  4. Right atrial size was moderately dilated.  5. The mitral valve is normal in structure. Trivial mitral valve regurgitation. No evidence of mitral stenosis.  6. Tricuspid valve  regurgitation is mild to moderate.  7. The aortic valve is tricuspid. Aortic valve regurgitation is not visualized. Mild aortic valve sclerosis is present, with no evidence of aortic valve stenosis.  8. The inferior vena cava is dilated in size with <50% respiratory variability, suggesting right atrial pressure of 15 mmHg.  9. Prominent ascites noted. 10. The patient was in atrial fibrillation.       Disposition Plan  :    Status is: Inpatient  Dispo: The patient is from: Home              Anticipated d/c is to: Home              Patient currently is not medically stable to d/c.   Difficult to place patient No  DVT Prophylaxis  :  Xaralto  Lab Results  Component Value Date   PLT 196 11/14/2020    Diet :  Diet Order            Diet heart healthy/carb modified Room service appropriate? Yes; Fluid consistency: Thin  Diet effective now                  Inpatient Medications  Scheduled Meds: . albuterol  2 puff Inhalation Q6H  . vitamin C  500 mg Oral Daily  . atorvastatin  20 mg Oral Daily  . dapagliflozin propanediol  5 mg Oral QAC breakfast  . insulin aspart  0-15 Units Subcutaneous TID WC  . insulin aspart  0-5 Units Subcutaneous QHS  . insulin aspart  4 Units Subcutaneous TID WC  . loratadine  10 mg Oral Daily  . mouth rinse  15 mL Mouth Rinse BID  . methylPREDNISolone (SOLU-MEDROL) injection  40 mg Intravenous Daily  . pantoprazole  40 mg Oral Daily  . rivaroxaban  20 mg Oral Q supper  . zinc sulfate  220 mg Oral Daily   Continuous Infusions: . remdesivir 100 mg in NS 100 mL Stopped (11/14/20 0841)   PRN Meds:.acetaminophen, albuterol, chlorpheniramine-HYDROcodone, lidocaine, ondansetron (ZOFRAN) IV  Antibiotics  :    Anti-infectives (From admission, onward)   Start     Dose/Rate Route Frequency Ordered Stop   11/12/20 1000  remdesivir 100 mg in sodium chloride 0.9 % 100 mL IVPB  Status:  Discontinued       "Followed by" Linked Group Details   100 mg 200  mL/hr over 30 Minutes Intravenous Daily 11/11/20 0442 11/11/20 0822   11/12/20 1000  remdesivir 100 mg in sodium chloride 0.9 % 100 mL IVPB       "Followed by" Linked Group Details   100 mg 200 mL/hr over 30 Minutes Intravenous Daily 11/11/20 0822 11/16/20 0959   11/11/20 0500  remdesivir 200 mg in sodium chloride 0.9% 250 mL IVPB       "Followed by" Linked Group Details   200 mg 580 mL/hr  over 30 Minutes Intravenous Once 11/11/20 0442 11/11/20 0726       Time Spent in minutes  30   Susa Raring M.D on 11/14/2020 at 9:16 AM  To page go to [email or web address]   [facility] -  Office  [phone]    See all Orders from today for further details    Objective:   Vitals:   11/14/20 0605 11/14/20 0610 11/14/20 0615 11/14/20 0752  BP:      Pulse: (!) 44 (!) 45 (!) 47   Resp:      Temp:      TempSrc:      SpO2: 92% 93% 90% 95%  Weight:      Height:        Wt Readings from Last 3 Encounters:  11/12/20 (!) 138 kg  11/10/20 (!) 138.8 kg  05/13/20 115.7 kg     Intake/Output Summary (Last 24 hours) at 11/14/2020 0916 Last data filed at 11/14/2020 0841 Gross per 24 hour  Intake 543.92 ml  Output 3980 ml  Net -3436.08 ml     Physical Exam  Awake Alert, No new F.N deficits, Normal affect [place].AT,PERRAL Supple Neck,No JVD, No cervical lymphadenopathy appriciated.  Symmetrical Chest wall movement, Good air movement bilaterally, CTAB RRR,No Gallops, Rubs or new Murmurs, No Parasternal Heave +ve B.Sounds, Abd distended, non tender. No Cyanosis, TEDs - 1+ edema    Data Review:    CBC Recent Labs  Lab 11/10/20 1722 11/10/20 1946 11/11/20 0404 11/12/20 0134 11/13/20 0124 11/14/20 0409  WBC 3.3*  --  2.9* 4.3 5.3 5.3  HGB 9.4*  --  9.6* 9.1* 8.7* 8.8*  HCT 28.7*  --  30.7* 29.1* 27.0* 26.3*  PLT 252  --  235 234 198 196  MCV 96.6  --  98.7 97.0 95.1 93.3  MCH 31.6  --  30.9 30.3 30.6 31.2  MCHC 32.8  --  31.3 31.3 32.2 33.5  RDW 15.5  --  15.4 15.2 14.6 14.3   LYMPHSABS  --  0.4* 0.4* 0.2* 0.3* 0.4*  MONOABS  --  0.3 0.3 0.1 0.2 0.3  EOSABS  --  0.1 0.1 0.0 0.0 0.0  BASOSABS  --  0.0 0.0 0.0 0.0 0.0    Recent Labs  Lab 11/10/20 1610 11/10/20 1722 11/10/20 1946 11/11/20 0404 11/11/20 0745 11/12/20 0134 11/13/20 0124 11/14/20 0409  NA  --  137  --  136  --  135 131* 130*  K  --  3.8  --  3.6  --  4.4 4.3 4.2  CL  --  101  --  100  --  99 98 97*  CO2  --  26  --  26  --  GLUCOSE  --  119*  --  163*  --  142* 169* 161*  BUN  --  21  --  20  --  22 25* 26*  CREATININE  --  1.47*  --  1.43*  --  1.67* 1.50* 1.29*  CALCIUM  --  8.7*  --  8.7*  --  8.6* 8.5* 8.6*  AST  --   --  20 18  --  21 13* 13*  ALT  --   --  11 11  --  ALKPHOS  --   --  113 98  --  102 83 79  BILITOT  --   --  1.7* 1.6*  --  1.3* 1.0 0.9  ALBUMIN  --   --  3.3* 3.1*  --  3.0* 2.6* 2.6*  MG  --   --   --  1.9  --  2.1 1.9 2.0  CRP  --   --   --   --  1.2* 1.2* 0.8 0.7  DDIMER  --   --   --   --  8.35* 8.64* 7.88* 6.23*  TSH  --   --   --   --  4.321  --  3.036  --   HGBA1C 5.3  --   --   --   --   --   --   --   BNP  --   --  693.0*  --   --  395.2* 442.0* 439.8*   ------------------------------------------------------------------------------------------------------------------ No results for input(s): CHOL, HDL, LDLCALC, TRIG, CHOLHDL, LDLDIRECT in the last 72 hours.  Lab Results  Component Value Date   HGBA1C 5.3 11/10/2020   ------------------------------------------------------------------------------------------------------------------ Recent Labs    11/13/20 0124  TSH 3.036    Cardiac Enzymes No results for input(s): CKMB, TROPONINI, MYOGLOBIN in the last 168 hours.  Invalid input(s): CK ------------------------------------------------------------------------------------------------------------------    Component Value Date/Time   BNP 439.8 (H) 11/14/2020 0409   BNP 301.0 (H) 11/24/2016 1150    Micro Results Recent  Results (from the past 240 hour(s))  Resp Panel by RT-PCR (Flu A&B, Covid) Nasopharyngeal Swab     Status: Abnormal   Collection Time: 11/10/20 10:41 PM   Specimen: Nasopharyngeal Swab; Nasopharyngeal(NP) swabs in vial transport medium  Result Value Ref Range Status   SARS Coronavirus 2 by RT PCR POSITIVE (A) NEGATIVE Final    Comment: RESULT CALLED TO, READ BACK BY AND VERIFIED WITH: E SULLIVAN RN 0011 11/11/20 A BROWNING (NOTE) SARS-CoV-2 target nucleic acids are DETECTED.  The SARS-CoV-2 RNA is generally detectable in upper respiratory specimens during the acute phase of infection. Positive results are indicative of the presence of the identified virus, but do not rule out bacterial infection or co-infection with other pathogens not detected by the test. Clinical correlation with patient history and other diagnostic information is necessary to determine patient infection status. The expected result is Negative.  Fact Sheet for Patients: BloggerCourse.com  Fact Sheet for Healthcare Providers: SeriousBroker.it  This test is not yet approved or cleared by the [place] FDA and  has been authorized for detection and/or diagnosis of SARS-CoV-2 by FDA under an Emergency Use Authorization (EUA).  This EUA will remain in effect (meaning this test can  be used) for the duration of  the COVID-19 declaration under Section 564(b)(1) of the Act, 21 U.S.C. section 360bbb-3(b)(1), unless the authorization is terminated or revoked sooner.     Influenza A by PCR NEGATIVE NEGATIVE Final   Influenza B by PCR NEGATIVE NEGATIVE Final    Comment: (NOTE) The Xpert Xpress SARS-CoV-2/FLU/RSV plus assay is intended as an aid in the diagnosis of influenza from Nasopharyngeal swab specimens and should not be used as a sole basis for treatment. Nasal washings and aspirates are unacceptable for Xpert Xpress SARS-CoV-2/FLU/RSV testing.  Fact Sheet for  Patients: BloggerCourse.com  Fact Sheet for Healthcare Providers: SeriousBroker.it  This test is not yet approved or cleared by the [place] FDA and has been authorized for detection and/or diagnosis of SARS-CoV-2 by FDA under an Emergency Use Authorization (EUA). This EUA will remain in effect (meaning this test can be used) for the duration of the COVID-19 declaration under Section 564(b)(1) of the Act, 21 U.S.C. section 360bbb-3(b)(1), unless the authorization  is terminated or revoked.  Performed at [facility], [address] [place], [place] [address]   Culture, body fluid w Gram Stain-bottle     Status: None (Preliminary result)   Collection Time: 11/12/20  3:51 PM   Specimen: Fluid  Result Value Ref Range Status   Specimen Description FLUID PERITONEAL ABDOMEN  Final   Special Requests NONE  Final   Culture   Final    NO GROWTH 2 DAYS Performed at [facility], [address] [place], [place] [address]    Report Status PENDING  Incomplete  Gram stain     Status: None   Collection Time: 11/12/20  3:51 PM   Specimen: Fluid  Result Value Ref Range Status   Specimen Description FLUID PERITONEAL  Final   Special Requests ABDOMEN  Final   Gram Stain   Final    WBC PRESENT, PREDOMINANTLY MONONUCLEAR NO ORGANISMS SEEN CYTOSPIN SMEAR Performed at [facility], [address] [place], [place] [address]    Report Status 11/13/2020 FINAL  Final    Radiology Reports DG Chest 2 View  Result Date: 11/10/2020 CLINICAL DATA:  Short of breath EXAM: CHEST - 2 VIEW COMPARISON:  03/30/2017 FINDINGS: Mild cardiomegaly with central vascular congestion. No overt edema or pleural effusion. No consolidation or pneumothorax. Old fracture deformity of the left clavicle. IMPRESSION: Cardiomegaly with mild central vascular congestion. Electronically Signed   By: Jasmine PangKim  Fujinaga M.D.   On: 11/10/2020 17:48   DG Chest Port  1 View  Result Date: 11/14/2020 CLINICAL DATA:  Shortness of breath.  History of COVID-19 pneumonia. EXAM: PORTABLE CHEST 1 VIEW COMPARISON:  November 12, 2020 FINDINGS: No pneumothorax. The cardiomediastinal silhouette is stable. No pulmonary nodules or masses. No focal infiltrates. No acute abnormalities. IMPRESSION: No active disease. Electronically Signed   By: Gerome Samavid  Williams III M.D   On: 11/14/2020 09:07   DG Chest Port 1 View  Result Date: 11/12/2020 CLINICAL DATA:  Shortness of breath. EXAM: PORTABLE CHEST 1 VIEW COMPARISON:  November 10, 2020. FINDINGS: Stable cardiomegaly. No pneumothorax or pleural effusion is noted. Lungs are clear. Bony thorax is unremarkable. IMPRESSION: No active disease. Electronically Signed   By: Lupita RaiderJames  Green Jr M.D.   On: 11/12/2020 09:10   ECHOCARDIOGRAM COMPLETE  Result Date: 11/11/2020    ECHOCARDIOGRAM REPORT   Patient Name:   Agapito GamesKENNETH W Schuermann Date of Exam: 11/11/2020 Medical Rec #:  [id number]       Height:       73.0 in Accession #:    [id number]      Weight:       306.0 lb Date of Birth:  01/26/1956       BSA:          2.578 m Patient Age:    64 years        BP:           134/74 mmHg Patient Gender: M               HR:           47 bpm. Exam Location:  Inpatient Procedure: 2D Echo, Cardiac Doppler and Color Doppler Indications:    I50.33 Acute on chronic diastolic (congestive) heart failure  History:        Patient has prior history of Echocardiogram examinations, most                 recent 11/28/2016. CHF, Arrythmias:Atrial Fibrillation,  Signs/Symptoms:Dyspnea; Risk Factors:Diabetes. COVID-19                 Positive.  Sonographer:    Elmarie Shiley Dance Referring Phys: [id number] Deno Lunger SHALHOUB IMPRESSIONS  1. Left ventricular ejection fraction, by estimation, is 55%. The left ventricle has normal function. The left ventricle has no regional wall motion abnormalities. Left ventricular diastolic parameters are indeterminate.  2. Right ventricular systolic function is  moderately reduced. The right ventricular size is severely enlarged. There is severely elevated pulmonary artery systolic pressure. The estimated right ventricular systolic pressure is 73.7 mmHg. D-shaped interventricular septum suggestive of RV pressure/volume overload.  3. Left atrial size was moderately dilated.  4. Right atrial size was moderately dilated.  5. The mitral valve is normal in structure. Trivial mitral valve regurgitation. No evidence of mitral stenosis.  6. Tricuspid valve regurgitation is mild to moderate.  7. The aortic valve is tricuspid. Aortic valve regurgitation is not visualized. Mild aortic valve sclerosis is present, with no evidence of aortic valve stenosis.  8. The inferior vena cava is dilated in size with <50% respiratory variability, suggesting right atrial pressure of 15 mmHg.  9. Prominent ascites noted. 10. The patient was in atrial fibrillation. FINDINGS  Left Ventricle: Left ventricular ejection fraction, by estimation, is 55%. The left ventricle has normal function. The left ventricle has no regional wall motion abnormalities. The left ventricular internal cavity size was normal in size. There is no left ventricular hypertrophy. Left ventricular diastolic parameters are indeterminate. Right Ventricle: The right ventricular size is severely enlarged. No increase in right ventricular wall thickness. Right ventricular systolic function is moderately reduced. There is severely elevated pulmonary artery systolic pressure. The tricuspid regurgitant velocity is 3.83 m/s, and with an assumed right atrial pressure of 15 mmHg, the estimated right ventricular systolic pressure is 73.7 mmHg. Left Atrium: Left atrial size was moderately dilated. Right Atrium: Right atrial size was moderately dilated. Pericardium: There is no evidence of pericardial effusion. Mitral Valve: The mitral valve is normal in structure. Mild mitral annular calcification. Trivial mitral valve regurgitation. No  evidence of mitral valve stenosis. Tricuspid Valve: The tricuspid valve is normal in structure. Tricuspid valve regurgitation is mild to moderate. Aortic Valve: The aortic valve is tricuspid. Aortic valve regurgitation is not visualized. Mild aortic valve sclerosis is present, with no evidence of aortic valve stenosis. Pulmonic Valve: The pulmonic valve was normal in structure. Pulmonic valve regurgitation is trivial. Aorta: The aortic root is normal in size and structure. Venous: The inferior vena cava is dilated in size with less than 50% respiratory variability, suggesting right atrial pressure of 15 mmHg. IAS/Shunts: No atrial level shunt detected by color flow Doppler.  LEFT VENTRICLE PLAX 2D LVIDd:         5.00 cm LVIDs:         2.70 cm LV PW:         1.50 cm LV IVS:        0.90 cm LVOT diam:     2.00 cm LV SV:         86 LV SV Index:   33 LVOT Area:     3.14 cm  RIGHT VENTRICLE          IVC RV Basal diam:  3.90 cm  IVC diam: 3.30 cm RV Mid diam:    2.60 cm TAPSE (M-mode): 1.8 cm LEFT ATRIUM              Index  RIGHT ATRIUM           Index LA diam:        5.40 cm  2.09 cm/m  RA Area:     27.40 cm LA Vol (A2C):   119.0 ml 46.16 ml/m RA Volume:   92.90 ml  36.04 ml/m LA Vol (A4C):   102.0 ml 39.57 ml/m LA Biplane Vol: 119.0 ml 46.16 ml/m  AORTIC VALVE LVOT Vmax:   119.50 cm/s LVOT Vmean:  78.700 cm/s LVOT VTI:    0.272 m  AORTA Ao Root diam: 3.50 cm Ao Asc diam:  3.20 cm MITRAL VALVE                TRICUSPID VALVE MV Area (PHT): 3.00 cm     TR Peak grad:   58.7 mmHg MV Decel Time: 253 msec     TR Vmax:        383.00 cm/s MV E velocity: 105.00 cm/s MV A velocity: 36.10 cm/s   SHUNTS MV E/A ratio:  2.91         Systemic VTI:  0.27 m                             Systemic Diam: 2.00 cm Marca Ancona MD Electronically signed by Marca Ancona MD Signature Date/Time: 11/11/2020/4:21:17 PM    Final    US Abdomen Limited RUQ (LIVER/GB)  Result Date: 11/11/2020 CLINICAL DATA:  Cirrhosis.  Ascites.  Abdominal  distention. EXAM: ULTRASOUND ABDOMEN LIMITED RIGHT UPPER QUADRANT COMPARISON:  No prior. FINDINGS: Gallbladder: No gallstones or wall thickening visualized. No sonographic Murphy sign noted by sonographer. Common bile duct: Diameter: 5.4 mm Liver: Increased echogenicity and nodular irregular cortex consistent with cirrhosis. No focal hepatic abnormality identified. Portal vein is patent on color Doppler imaging with normal direction of blood flow towards the liver. Other: Prominent ascites. IMPRESSION: 1. No gallstones or biliary distention. 2. Increased hepatic echogenicity and nodular hepatic cortex consistent with cirrhosis. No focal hepatic abnormality identified. 3. Prominent ascites. Electronically Signed   By: Maisie Fus  Register   On: 11/11/2020 06:58   IR Paracentesis  Result Date: 11/13/2020 INDICATION: History of CAD with prior CABG, cirrhosis, and ascites. Request was made for therapeutic paracentesis up to 6 L. EXAM: ULTRASOUND GUIDED THERAPEUTIC PARACENTESIS MEDICATIONS: 12 mL 1% lidocaine COMPLICATIONS: None immediate. PROCEDURE: Informed written consent was obtained from the patient after a discussion of the risks, benefits and alternatives to treatment. A timeout was performed prior to the initiation of the procedure. Initial ultrasound scanning demonstrates a large amount of ascites within the right lower abdominal quadrant. The right lower abdomen was prepped and draped in the usual sterile fashion. 1% lidocaine was used for local anesthesia. Following this, a 19 gauge, 15-cm, Yueh catheter was introduced. An ultrasound image was saved for documentation purposes. The paracentesis was performed. The catheter was removed and a dressing was applied. The patient tolerated the procedure well without immediate post procedural complication. Patient received post-procedure intravenous albumin; see nursing notes for details. FINDINGS: A total of approximately 6 L of clear yellow fluid was removed.  IMPRESSION: Successful ultrasound-guided paracentesis yielding 6 liters of peritoneal fluid. Read by: Lawernce Ion, PA-C Electronically Signed   By: Corlis Leak M.D.   On: 11/13/2020 16:18   IR Paracentesis  Result Date: 11/12/2020 INDICATION: Patient with history of COVID 19 pneumonia, coronary artery disease with prior CABG, cirrhosis by imaging, ascites; request received for diagnostic and  therapeutic paracentesis up to 5 liters. EXAM: ULTRASOUND GUIDED DIAGNOSTIC AND THERAPEUTIC  PARACENTESIS MEDICATIONS: 1% lidocaine to skin and SQ tissue COMPLICATIONS: None immediate. PROCEDURE: Informed written consent was obtained from the patient after a discussion of the risks, benefits and alternatives to treatment. A timeout was performed prior to the initiation of the procedure. Initial ultrasound scanning demonstrates a large amount of ascites within the left lower abdominal quadrant. The left lower abdomen was prepped and draped in the usual sterile fashion. 1% lidocaine was used for local anesthesia. Following this, a 19 gauge, 10-cm, Yueh catheter was introduced. An ultrasound image was saved for documentation purposes. The paracentesis was performed. The catheter was removed and a dressing was applied. The patient tolerated the procedure well without immediate post procedural complication. Patient received post-procedure intravenous albumin; see nursing notes for details. FINDINGS: A total of approximately 5 liters of yellow fluid was removed. Samples were sent to the laboratory as requested by the clinical team. IMPRESSION: Successful ultrasound-guided diagnostic and therapeutic paracentesis yielding 5 liters of peritoneal fluid. Read by: Artemio Aly Electronically Signed   By: Marliss Coots MD   On: 11/12/2020 15:38

## 2020-11-14 NOTE — Progress Notes (Signed)
Notified by central telemetry of patient's nonsustained bradycardia with rates as low as 25. Patient assessed by this RN. Patient A&O x4 and states, "I feel fine. I just can't get no rest." Patient denies CP or feeling "light headed" MD notified and gave TO for 1x dose of 0.5mg  of atropine. Atropine administered and HR increased to 50's/60's.

## 2020-11-14 NOTE — Progress Notes (Addendum)
   Patient Name: Paul Mclaughlin Date of Encounter: 11/14/2020, 9:44 AM Covering for Dr. Elnoria Howard  Subjective  Feels better after paracenteses    Objective  BP 132/66   Pulse (!) 47   Temp 98.1 F (36.7 C) (Oral)   Resp 20   Ht 6\' 1"  (1.854 m)   Wt (!) 138 kg   SpO2 95%   BMI 40.14 kg/m  Chronically ill white man lying in bed flat no respiratory distress Abdomen markedly protuberant with ascites Extremities with edema He is alert and oriented x3 Heart sounds are distant   Lab Results  Component Value Date   CREATININE 1.29 (H) 11/14/2020   BUN 26 (H) 11/14/2020   NA 130 (L) 11/14/2020   K 4.2 11/14/2020   CL 97 (L) 11/14/2020   CO2 26 11/14/2020   Lab Results  Component Value Date   ALT 10 11/14/2020   AST 13 (L) 11/14/2020   ALKPHOS 79 11/14/2020   BILITOT 0.9 11/14/2020   Lab Results  Component Value Date   INR 1.25 03/30/2017   INR 1.2 03/16/2017   Lab Results  Component Value Date   WBC 5.3 11/14/2020   HGB 8.8 (L) 11/14/2020   HCT 26.3 (L) 11/14/2020   MCV 93.3 11/14/2020   PLT 196 11/14/2020   Albumin 2.6 BNP 439.8  Ultrasound shows a nodular liver with also suspected steatosis changes.  Images reviewed personally  Assessment and Plan  Ascites and edema, ascites is high-protein SAAG was low repeat paracentesis but radiology did not obtain albumin and protein levels Abnormal liver on ultrasound raising question of cirrhosis Right heart failure   I think his main problem is right heart failure and question if he actually has true or typical cirrhosis he may have "cardiac cirrhosis" from hepatic congestion.   He needs cardiac consultation he may need a right heart cath, he could potentially benefit from hepatic wedge pressure to understand if he truly has portal hypertension.  His creatinine precludes adequate imaging of his liver with CT or MRI at this point though that is not urgent.  Treatment is going to be the same regardless at this point I  think.  Consider a complete abdominal ultrasound.  Portal vein was patent with flow towards the liver which goes against portal hypertension as a cause of his ascites as well.  He certainly is a candidate for fatty liver disease so intrinsic liver disease possible as well.  Will follow up tomorrow as well versus Monday depending upon clinical course.  Dr. Tuesday and/or Elnoria Howard resume GI consultation care on Monday   Sunday, MD, [facility] 11/14/2020 9:44 AM

## 2020-11-15 ENCOUNTER — Inpatient Hospital Stay (HOSPITAL_COMMUNITY): Payer: Medicaid Other

## 2020-11-15 DIAGNOSIS — I5033 Acute on chronic diastolic (congestive) heart failure: Secondary | ICD-10-CM | POA: Diagnosis not present

## 2020-11-15 DIAGNOSIS — Z8619 Personal history of other infectious and parasitic diseases: Secondary | ICD-10-CM | POA: Diagnosis not present

## 2020-11-15 DIAGNOSIS — J90 Pleural effusion, not elsewhere classified: Secondary | ICD-10-CM | POA: Diagnosis not present

## 2020-11-15 DIAGNOSIS — R0602 Shortness of breath: Secondary | ICD-10-CM | POA: Diagnosis not present

## 2020-11-15 LAB — CBC WITH DIFFERENTIAL/PLATELET
Abs Immature Granulocytes: 0.02 10*3/uL (ref 0.00–0.07)
Basophils Absolute: 0 10*3/uL (ref 0.0–0.1)
Basophils Relative: 0 %
Eosinophils Absolute: 0 10*3/uL (ref 0.0–0.5)
Eosinophils Relative: 0 %
HCT: 27.5 % — ABNORMAL LOW (ref 39.0–52.0)
Hemoglobin: 9.4 g/dL — ABNORMAL LOW (ref 13.0–17.0)
Immature Granulocytes: 0 %
Lymphocytes Relative: 6 %
Lymphs Abs: 0.4 10*3/uL — ABNORMAL LOW (ref 0.7–4.0)
MCH: 31.8 pg (ref 26.0–34.0)
MCHC: 34.2 g/dL (ref 30.0–36.0)
MCV: 92.9 fL (ref 80.0–100.0)
Monocytes Absolute: 0.3 10*3/uL (ref 0.1–1.0)
Monocytes Relative: 5 %
Neutro Abs: 6 10*3/uL (ref 1.7–7.7)
Neutrophils Relative %: 89 %
Platelets: 201 10*3/uL (ref 150–400)
RBC: 2.96 MIL/uL — ABNORMAL LOW (ref 4.22–5.81)
RDW: 14.3 % (ref 11.5–15.5)
WBC: 6.8 10*3/uL (ref 4.0–10.5)
nRBC: 0 % (ref 0.0–0.2)

## 2020-11-15 LAB — BRAIN NATRIURETIC PEPTIDE: B Natriuretic Peptide: 417 pg/mL — ABNORMAL HIGH (ref 0.0–100.0)

## 2020-11-15 LAB — COMPREHENSIVE METABOLIC PANEL
ALT: 13 U/L (ref 0–44)
AST: 17 U/L (ref 15–41)
Albumin: 2.8 g/dL — ABNORMAL LOW (ref 3.5–5.0)
Alkaline Phosphatase: 87 U/L (ref 38–126)
Anion gap: 9 (ref 5–15)
BUN: 27 mg/dL — ABNORMAL HIGH (ref 8–23)
CO2: 26 mmol/L (ref 22–32)
Calcium: 8.8 mg/dL — ABNORMAL LOW (ref 8.9–10.3)
Chloride: 96 mmol/L — ABNORMAL LOW (ref 98–111)
Creatinine, Ser: 1.23 mg/dL (ref 0.61–1.24)
GFR, Estimated: >60 mL/min (ref >60)
Glucose, Bld: 168 mg/dL — ABNORMAL HIGH (ref 70–99)
Potassium: 4.3 mmol/L (ref 3.5–5.1)
Sodium: 131 mmol/L — ABNORMAL LOW (ref 135–145)
Total Bilirubin: 0.8 mg/dL (ref 0.3–1.2)
Total Protein: 5.9 g/dL — ABNORMAL LOW (ref 6.5–8.1)

## 2020-11-15 LAB — MAGNESIUM: Magnesium: 2.2 mg/dL (ref 1.7–2.4)

## 2020-11-15 LAB — GLUCOSE, CAPILLARY
Glucose-Capillary: 131 mg/dL — ABNORMAL HIGH (ref 70–99)
Glucose-Capillary: 162 mg/dL — ABNORMAL HIGH (ref 70–99)
Glucose-Capillary: 175 mg/dL — ABNORMAL HIGH (ref 70–99)
Glucose-Capillary: 171 mg/dL — ABNORMAL HIGH (ref 70–99)

## 2020-11-15 LAB — D-DIMER, QUANTITATIVE: D-Dimer, Quant: 7.36 ug/mL-FEU — ABNORMAL HIGH (ref 0.00–0.50)

## 2020-11-15 LAB — C-REACTIVE PROTEIN: CRP: 0.7 mg/dL (ref <1.0)

## 2020-11-15 MED ORDER — TECHNETIUM TO 99M ALBUMIN AGGREGATED
4.3000 | Freq: Once | INTRAVENOUS | Status: AC | PRN
  Administered 2020-11-15: 4.3 via INTRAVENOUS

## 2020-11-15 MED ORDER — ALBUMIN HUMAN 25 % IV SOLN: 50.0000 g | Freq: Once | INTRAVENOUS | Status: DC | PRN

## 2020-11-15 NOTE — Progress Notes (Signed)
PROGRESS NOTE                                                                                                                                                                                                             Patient Demographics:    Paul Mclaughlin, is a 65 y.o. male, DOB - Jul 03, 1956, BJY:[id number]  Outpatient Primary MD for the patient is [name], MD    LOS - 4  Admit date - 11/10/2020    Chief Complaint  Patient presents with  . Shortness of Breath  . Congestive Heart Failure       Brief Narrative (HPI from H&P)   this is a 65 year old Caucasian gentleman with history of VSD, CAD s/p CABG, hypertension, dyslipidemia, paroxysmal A. fib on Xarelto for anticoagulation who has taken 2 shots of COVID-19 mRNA vaccine so far presents to the hospital with 7 to 10-day history of progressive shortness of breath and some increasing edema, he was in the ER and was diagnosed with acute COVID-19 infection/pneumonia along with acute on chronic CHF exacerbation.  He was also noted to have abdominal distention with possible ascites causing shortness of breath   Subjective:   Patient in bed, appears comfortable, denies any headache, no fever, no chest pain or pressure, no shortness of breath , no abdominal pain. No focal weakness.    Assessment  & Plan :     1. Acute Hypoxic Resp. Failure due to Acute Covid 19 Viral Pneumonitis long with possible acute on chronic diastolic CHF percent and massive ascites causing diaphragmatic compression- he is vaccinated and could have had mild COVID-19 pneumonia, will place him on moderate dose IV steroids along with remdesivir, monitor inflammatory marker.  Has been diuresed with Lasix, I think there is some element of undiagnosed ascites which is contributing to his shortness of breath as well, kindly see #8 below.  Encouraged the patient to sit up in chair in the daytime use I-S and  flutter valve for pulmonary toiletry and then prone in bed when at night.  Will advance activity and titrate down oxygen as possible.  SpO2: 99 % O2 Flow Rate (L/min): 2 L/min  Recent Labs  Lab 11/10/20 1946 11/10/20 2241 11/11/20 0404 11/11/20 0745 11/12/20 0134 11/13/20 0124 11/14/20 0409 11/15/20 0411  WBC  --   --  2.9*  --  4.3 5.3 5.3 6.8  HGB  --   --  9.6*  --  9.1* 8.7* 8.8* 9.4*  HCT  --   --  30.7*  --  29.1* 27.0* 26.3* 27.5*  PLT  --   --  235  --  234 198 196 201  CRP  --   --   --  1.2* 1.2* 0.8 0.7 0.7  BNP 693.0*  --   --   --  395.2* 442.0* 439.8* 417.0*  DDIMER  --   --   --  8.35* 8.64* 7.88* 6.23* 7.36*  AST 20  --  18  --  21 13* 13* 17  ALT 11  --  11  --  ALKPHOS 113  --  98  --  102 83 79 87  BILITOT 1.7*  --  1.6*  --  1.3* 1.0 0.9 0.8  ALBUMIN 3.3*  --  3.1*  --  3.0* 2.6* 2.6* 2.8*  SARSCOV2NAA  --  POSITIVE*  --   --   --   --   --   --     2. CAD s/p CABG. Currently chest pain-free no acute issues, has history of VSD. Currently stable no acute issues, continue statin, Xarelto echo shows preserved EF, discontinue ACE for now as he has developed mild AKI.  Only on Norvasc and hydralazine will change medications to beta-blocker and low-dose hydralazine.  3. Paroxysmal A. fib. [place] vas 2 score of greater than 3.  Continue Xarelto, has A. fib with low ventricular response hence no beta-blocker.  Stable TSH.  4. New diagnosis of cirrhosis and ascites with history of severe pulmonary venous hypertension diagnosed in 2018 - he has undergone 2 ultrasound-guided paracentesis with total of about 10 L of fluid removed so far this admission, SAAG score under 1, repeating another paracentesis with protein counts on 11/15/2020.  Diuretics as tolerated by blood pressure.  Acute hepatitis panel negative, this could be due to severe pulmonary venous hypertension, leg ultrasound shows no DVTs, VQ scan pending to rule out chronic PEs.  Already on full  anticoagulation but D-dimer always stays high.  Looked at his right heart cath report from 2018 and reviewed with cardiologist Dr. Anne Fu.  Outpatient cardiology follow-up.  5. Dyslipidemia on statin.  6. GERD. PPI.  7.  AKI.  Cautious with diuretics, hold ACE inhibitor, avoid hypotension, blood pressure medications adjusted as well, since he is undergoing large volume paracentesis will hold further diuretics.  8. Hypretension.  Blood pressure soft currently off of blood pressure medications except for low-dose diuretics as tolerated.  9. DM2 - continue Farxiga, placed on moderate dose ISS along with Premeal NovoLog, monitor in presence of steroids  Lab Results  Component Value Date   HGBA1C 5.3 11/10/2020   CBG (last 3)  Recent Labs    11/14/20 1755 11/14/20 2103 11/15/20 0809  GLUCAP 176* 201* 131*        Condition -   Guarded  Family Communication  : Patient will keep his family and friends updated himself, no immediate family around.  Code Status :  Full  Consults  :  None  PUD Prophylaxis : PPI   Procedures  :     Therapeutic paracentesis 11/13/2020 with 6 L of fluid removed.    Ultrasound-guided paracentesis with 5 L of fluid removed on 11/12/2020.  No signs of SBP, SAAG score surprisingly is under 1   right upper quadrant ultrasound.  Possible  cirrhosis.  TTE - 1. Left ventricular ejection fraction, by estimation, is 55%. The left ventricle has normal function. The left ventricle has no regional wall motion abnormalities. Left ventricular diastolic parameters are indeterminate.  2. Right ventricular systolic function is moderately reduced. The right ventricular size is severely enlarged. There is severely elevated pulmonary artery systolic pressure. The estimated right ventricular systolic pressure is 73.7 mmHg. D-shaped interventricular septum suggestive of RV pressure/volume overload.  3. Left atrial size was moderately dilated.  4. Right atrial size was moderately  dilated.  5. The mitral valve is normal in structure. Trivial mitral valve regurgitation. No evidence of mitral stenosis.  6. Tricuspid valve regurgitation is mild to moderate.  7. The aortic valve is tricuspid. Aortic valve regurgitation is not visualized. Mild aortic valve sclerosis is present, with no evidence of aortic valve stenosis.  8. The inferior vena cava is dilated in size with <50% respiratory variability, suggesting right atrial pressure of 15 mmHg.  9. Prominent ascites noted. 10. The patient was in atrial fibrillation.       Disposition Plan  :    Status is: Inpatient  Dispo: The patient is from: Home              Anticipated d/c is to: Home              Patient currently is not medically stable to d/c.   Difficult to place patient No  DVT Prophylaxis  :  Xaralto  Lab Results  Component Value Date   PLT 201 11/15/2020    Diet :  Diet Order            Diet heart healthy/carb modified Room service appropriate? Yes; Fluid consistency: Thin  Diet effective now                  Inpatient Medications  Scheduled Meds: . albuterol  2 puff Inhalation Q6H  . vitamin C  500 mg Oral Daily  . atorvastatin  20 mg Oral Daily  . dapagliflozin propanediol  5 mg Oral QAC breakfast  . insulin aspart  0-15 Units Subcutaneous TID WC  . insulin aspart  0-5 Units Subcutaneous QHS  . insulin aspart  4 Units Subcutaneous TID WC  . loratadine  10 mg Oral Daily  . mouth rinse  15 mL Mouth Rinse BID  . methylPREDNISolone (SOLU-MEDROL) injection  40 mg Intravenous Daily  . pantoprazole  40 mg Oral Daily  . rivaroxaban  20 mg Oral Q supper  . zinc sulfate  220 mg Oral Daily   Continuous Infusions:  PRN Meds:.acetaminophen, albuterol, chlorpheniramine-HYDROcodone, lidocaine, ondansetron (ZOFRAN) IV  Antibiotics  :    Anti-infectives (From admission, onward)   Start     Dose/Rate Route Frequency Ordered Stop   11/12/20 1000  remdesivir 100 mg in sodium chloride 0.9 % 100 mL IVPB   Status:  Discontinued       "Followed by" Linked Group Details   100 mg 200 mL/hr over 30 Minutes Intravenous Daily 11/11/20 0442 11/11/20 0822   11/12/20 1000  remdesivir 100 mg in sodium chloride 0.9 % 100 mL IVPB       "Followed by" Linked Group Details   100 mg 200 mL/hr over 30 Minutes Intravenous Daily 11/11/20 0822 11/15/20 0928   11/11/20 0500  remdesivir 200 mg in sodium chloride 0.9% 250 mL IVPB       "Followed by" Linked Group Details   200 mg 580 mL/hr over 30 Minutes Intravenous  Once 11/11/20 0442 11/11/20 0726       Time Spent in minutes  30   Susa Raring M.D on 11/15/2020 at 11:10 AM  To page go to [email or web address]   [facility] -  Office  [phone]    See all Orders from today for further details    Objective:   Vitals:   11/14/20 2054 11/14/20 2257 11/15/20 0642 11/15/20 0645  BP: (!) 144/78 139/88  (!) 151/74  Pulse: (!) 47 (!) 50  (!) 54  Resp:      Temp: 97.7 F (36.5 C) 97.8 F (36.6 C)  97.8 F (36.6 C)  TempSrc: Oral Oral  Oral  SpO2: 98% 96%  99%  Weight:   125.9 kg   Height:        Wt Readings from Last 3 Encounters:  11/15/20 125.9 kg  11/10/20 (!) 138.8 kg  05/13/20 115.7 kg     Intake/Output Summary (Last 24 hours) at 11/15/2020 1110 Last data filed at 11/15/2020 0845 Gross per 24 hour  Intake 300 ml  Output 4550 ml  Net -4250 ml     Physical Exam  Awake Alert, No new F.N deficits, Normal affect [place].AT,PERRAL Supple Neck,No JVD, No cervical lymphadenopathy appriciated.  Symmetrical Chest wall movement, Good air movement bilaterally, CTAB RRR,No Gallops, Rubs or new Murmurs, No Parasternal Heave +ve B.Sounds, Abd distended No Cyanosis, 1+ leg edema      Data Review:    CBC Recent Labs  Lab 11/11/20 0404 11/12/20 0134 11/13/20 0124 11/14/20 0409 11/15/20 0411  WBC 2.9* 4.3 5.3 5.3 6.8  HGB 9.6* 9.1* 8.7* 8.8* 9.4*  HCT 30.7* 29.1* 27.0* 26.3* 27.5*  PLT 235 234 198 196 201  MCV 98.7 97.0 95.1 93.3  92.9  MCH 30.9 30.3 30.6 31.2 31.8  MCHC 31.3 31.3 32.2 33.5 34.2  RDW 15.4 15.2 14.6 14.3 14.3  LYMPHSABS 0.4* 0.2* 0.3* 0.4* 0.4*  MONOABS 0.3 0.1 0.2 0.3 0.3  EOSABS 0.1 0.0 0.0 0.0 0.0  BASOSABS 0.0 0.0 0.0 0.0 0.0    Recent Labs  Lab 11/10/20 1610 11/10/20 1722 11/10/20 1946 11/11/20 0404 11/11/20 0745 11/12/20 0134 11/13/20 0124 11/14/20 0409 11/15/20 0411  NA  --    < >  --  136  --  135 131* 130* 131*  K  --    < >  --  3.6  --  4.4 4.3 4.2 4.3  CL  --    < >  --  100  --  99 98 97* 96*  CO2  --    < >  --  26  --  26 25 26 26   GLUCOSE  --    < >  --  163*  --  142* 169* 161* 168*  BUN  --    < >  --  20  --  22 25* 26* 27*  CREATININE  --    < >  --  1.43*  --  1.67* 1.50* 1.29* 1.23  CALCIUM  --    < >  --  8.7*  --  8.6* 8.5* 8.6* 8.8*  AST  --    < > 20 18  --  21 13* 13* 17  ALT  --    < > 11 11  --  11 10 10 13   ALKPHOS  --    < > 113 98  --  102 83 79 87  BILITOT  --    < > 1.7* 1.6*  --  1.3* 1.0 0.9 0.8  ALBUMIN  --    < > 3.3* 3.1*  --  3.0* 2.6* 2.6* 2.8*  MG  --   --   --  1.9  --  2.1 1.9 2.0 2.2  CRP  --   --   --   --  1.2* 1.2* 0.8 0.7 0.7  DDIMER  --   --   --   --  8.35* 8.64* 7.88* 6.23* 7.36*  TSH  --   --   --   --  4.321  --  3.036  --   --   HGBA1C 5.3  --   --   --   --   --   --   --   --   BNP  --   --  693.0*  --   --  395.2* 442.0* 439.8* 417.0*   < > = values in this interval not displayed.   ------------------------------------------------------------------------------------------------------------------ No results for input(s): CHOL, HDL, LDLCALC, TRIG, CHOLHDL, LDLDIRECT in the last 72 hours.  Lab Results  Component Value Date   HGBA1C 5.3 11/10/2020   ------------------------------------------------------------------------------------------------------------------ Recent Labs    11/13/20 0124  TSH 3.036    Cardiac Enzymes No results for input(s): CKMB, TROPONINI, MYOGLOBIN in the last 168 hours.  Invalid input(s):  CK ------------------------------------------------------------------------------------------------------------------    Component Value Date/Time   BNP 417.0 (H) 11/15/2020 0411   BNP 301.0 (H) 11/24/2016 1150    Micro Results Recent Results (from the past 240 hour(s))  Resp Panel by RT-PCR (Flu A&B, Covid) Nasopharyngeal Swab     Status: Abnormal   Collection Time: 11/10/20 10:41 PM   Specimen: Nasopharyngeal Swab; Nasopharyngeal(NP) swabs in vial transport medium  Result Value Ref Range Status   SARS Coronavirus 2 by RT PCR POSITIVE (A) NEGATIVE Final    Comment: RESULT CALLED TO, READ BACK BY AND VERIFIED WITH: E SULLIVAN RN 0011 11/11/20 A BROWNING (NOTE) SARS-CoV-2 target nucleic acids are DETECTED.  The SARS-CoV-2 RNA is generally detectable in upper respiratory specimens during the acute phase of infection. Positive results are indicative of the presence of the identified virus, but do not rule out bacterial infection or co-infection with other pathogens not detected by the test. Clinical correlation with patient history and other diagnostic information is necessary to determine patient infection status. The expected result is Negative.  Fact Sheet for Patients: BloggerCourse.com  Fact Sheet for Healthcare Providers: SeriousBroker.it  This test is not yet approved or cleared by the [place] FDA and  has been authorized for detection and/or diagnosis of SARS-CoV-2 by FDA under an Emergency Use Authorization (EUA).  This EUA will remain in effect (meaning this test can  be used) for the duration of  the COVID-19 declaration under Section 564(b)(1) of the Act, 21 U.S.C. section 360bbb-3(b)(1), unless the authorization is terminated or revoked sooner.     Influenza A by PCR NEGATIVE NEGATIVE Final   Influenza B by PCR NEGATIVE NEGATIVE Final    Comment: (NOTE) The Xpert Xpress SARS-CoV-2/FLU/RSV plus assay is  intended as an aid in the diagnosis of influenza from Nasopharyngeal swab specimens and should not be used as a sole basis for treatment. Nasal washings and aspirates are unacceptable for Xpert Xpress SARS-CoV-2/FLU/RSV testing.  Fact Sheet for Patients: BloggerCourse.com  Fact Sheet for Healthcare Providers: SeriousBroker.it  This test is not yet approved or cleared by the [place] FDA and has been authorized for detection and/or diagnosis of SARS-CoV-2 by FDA under an Emergency  Use Authorization (EUA). This EUA will remain in effect (meaning this test can be used) for the duration of the COVID-19 declaration under Section 564(b)(1) of the Act, 21 U.S.C. section 360bbb-3(b)(1), unless the authorization is terminated or revoked.  Performed at [facility], [address] [place], [place] [address]   Culture, body fluid w Gram Stain-bottle     Status: None (Preliminary result)   Collection Time: 11/12/20  3:51 PM   Specimen: Fluid  Result Value Ref Range Status   Specimen Description FLUID PERITONEAL ABDOMEN  Final   Special Requests NONE  Final   Culture   Final    NO GROWTH 3 DAYS Performed at [facility], [address], [place], [place] [address]    Report Status PENDING  Incomplete  Gram stain     Status: None   Collection Time: 11/12/20  3:51 PM   Specimen: Fluid  Result Value Ref Range Status   Specimen Description FLUID PERITONEAL  Final   Special Requests ABDOMEN  Final   Gram Stain   Final    WBC PRESENT, PREDOMINANTLY MONONUCLEAR NO ORGANISMS SEEN CYTOSPIN SMEAR Performed at [facility] [place], [address] [place], [place] [address]    Report Status 11/13/2020 FINAL  Final    Radiology Reports DG Chest 2 View  Result Date: 11/10/2020 CLINICAL DATA:  Short of breath EXAM: CHEST - 2 VIEW COMPARISON:  03/30/2017 FINDINGS: Mild cardiomegaly with central vascular congestion. No overt edema  or pleural effusion. No consolidation or pneumothorax. Old fracture deformity of the left clavicle. IMPRESSION: Cardiomegaly with mild central vascular congestion. Electronically Signed   By: Jasmine Pang M.D.   On: 11/10/2020 17:48   DG Chest Port 1 View  Result Date: 11/14/2020 CLINICAL DATA:  Shortness of breath.  History of COVID-19 pneumonia. EXAM: PORTABLE CHEST 1 VIEW COMPARISON:  November 12, 2020 FINDINGS: No pneumothorax. The cardiomediastinal silhouette is stable. No pulmonary nodules or masses. No focal infiltrates. No acute abnormalities. IMPRESSION: No active disease. Electronically Signed   By: Gerome Sam III M.D   On: 11/14/2020 09:07   DG Chest Port 1 View  Result Date: 11/12/2020 CLINICAL DATA:  Shortness of breath. EXAM: PORTABLE CHEST 1 VIEW COMPARISON:  November 10, 2020. FINDINGS: Stable cardiomegaly. No pneumothorax or pleural effusion is noted. Lungs are clear. Bony thorax is unremarkable. IMPRESSION: No active disease. Electronically Signed   By: Lupita Raider M.D.   On: 11/12/2020 09:10   ECHOCARDIOGRAM COMPLETE  Result Date: 11/11/2020    ECHOCARDIOGRAM REPORT   Patient Name:   BERNARDINO DOWELL Date of Exam: 11/11/2020 Medical Rec #:  [id number]       Height:       73.0 in Accession #:    [id number]      Weight:       306.0 lb Date of Birth:  03/13/1956       BSA:          2.578 m Patient Age:    64 years        BP:           134/74 mmHg Patient Gender: M               HR:           47 bpm. Exam Location:  Inpatient Procedure: 2D Echo, Cardiac Doppler and Color Doppler Indications:    I50.33 Acute on chronic diastolic (congestive) heart failure  History:  Patient has prior history of Echocardiogram examinations, most                 recent 11/28/2016. CHF, Arrythmias:Atrial Fibrillation,                 Signs/Symptoms:Dyspnea; Risk Factors:Diabetes. COVID-19                 Positive.  Sonographer:    Elmarie Shiley Dance Referring Phys: [id number] Deno Lunger [place]AL[place] IMPRESSIONS  1. Left  ventricular ejection fraction, by estimation, is 55%. The left ventricle has normal function. The left ventricle has no regional wall motion abnormalities. Left ventricular diastolic parameters are indeterminate.  2. Right ventricular systolic function is moderately reduced. The right ventricular size is severely enlarged. There is severely elevated pulmonary artery systolic pressure. The estimated right ventricular systolic pressure is 73.7 mmHg. D-shaped interventricular septum suggestive of RV pressure/volume overload.  3. Left atrial size was moderately dilated.  4. Right atrial size was moderately dilated.  5. The mitral valve is normal in structure. Trivial mitral valve regurgitation. No evidence of mitral stenosis.  6. Tricuspid valve regurgitation is mild to moderate.  7. The aortic valve is tricuspid. Aortic valve regurgitation is not visualized. Mild aortic valve sclerosis is present, with no evidence of aortic valve stenosis.  8. The inferior vena cava is dilated in size with <50% respiratory variability, suggesting right atrial pressure of 15 mmHg.  9. Prominent ascites noted. 10. The patient was in atrial fibrillation. FINDINGS  Left Ventricle: Left ventricular ejection fraction, by estimation, is 55%. The left ventricle has normal function. The left ventricle has no regional wall motion abnormalities. The left ventricular internal cavity size was normal in size. There is no left ventricular hypertrophy. Left ventricular diastolic parameters are indeterminate. Right Ventricle: The right ventricular size is severely enlarged. No increase in right ventricular wall thickness. Right ventricular systolic function is moderately reduced. There is severely elevated pulmonary artery systolic pressure. The tricuspid regurgitant velocity is 3.83 m/s, and with an assumed right atrial pressure of 15 mmHg, the estimated right ventricular systolic pressure is 73.7 mmHg. Left Atrium: Left atrial size was moderately  dilated. Right Atrium: Right atrial size was moderately dilated. Pericardium: There is no evidence of pericardial effusion. Mitral Valve: The mitral valve is normal in structure. Mild mitral annular calcification. Trivial mitral valve regurgitation. No evidence of mitral valve stenosis. Tricuspid Valve: The tricuspid valve is normal in structure. Tricuspid valve regurgitation is mild to moderate. Aortic Valve: The aortic valve is tricuspid. Aortic valve regurgitation is not visualized. Mild aortic valve sclerosis is present, with no evidence of aortic valve stenosis. Pulmonic Valve: The pulmonic valve was normal in structure. Pulmonic valve regurgitation is trivial. Aorta: The aortic root is normal in size and structure. Venous: The inferior vena cava is dilated in size with less than 50% respiratory variability, suggesting right atrial pressure of 15 mmHg. IAS/Shunts: No atrial level shunt detected by color flow Doppler.  LEFT VENTRICLE PLAX 2D LVIDd:         5.00 cm LVIDs:         2.70 cm LV PW:         1.50 cm LV IVS:        0.90 cm LVOT diam:     2.00 cm LV SV:         86 LV SV Index:   33 LVOT Area:     3.14 cm  RIGHT VENTRICLE  IVC RV Basal diam:  3.90 cm  IVC diam: 3.30 cm RV Mid diam:    2.60 cm TAPSE (M-mode): 1.8 cm LEFT ATRIUM              Index       RIGHT ATRIUM           Index LA diam:        5.40 cm  2.09 cm/m  RA Area:     27.40 cm LA Vol (A2C):   119.0 ml 46.16 ml/m RA Volume:   92.90 ml  36.04 ml/m LA Vol (A4C):   102.0 ml 39.57 ml/m LA Biplane Vol: 119.0 ml 46.16 ml/m  AORTIC VALVE LVOT Vmax:   119.50 cm/s LVOT Vmean:  78.700 cm/s LVOT VTI:    0.272 m  AORTA Ao Root diam: 3.50 cm Ao Asc diam:  3.20 cm MITRAL VALVE                TRICUSPID VALVE MV Area (PHT): 3.00 cm     TR Peak grad:   58.7 mmHg MV Decel Time: 253 msec     TR Vmax:        383.00 cm/s MV E velocity: 105.00 cm/s MV A velocity: 36.10 cm/s   SHUNTS MV E/A ratio:  2.91         Systemic VTI:  0.27 m                              Systemic Diam: 2.00 cm Marca Ancona MD Electronically signed by Marca Ancona MD Signature Date/Time: 11/11/2020/4:21:17 PM    Final    VAS [place] LOWER EXTREMITY VENOUS (DVT)  Result Date: 11/14/2020  Lower Venous DVT Study Indications: Edema, and Covid + elevated D-dimer.  Risk Factors: CHF - post paracentesis. Comparison Study: No previous exams Performing Technologist: Ernestene Mention  Examination Guidelines: A complete evaluation includes B-mode imaging, spectral Doppler, color Doppler, and power Doppler as needed of all accessible portions of each vessel. Bilateral testing is considered an integral part of a complete examination. Limited examinations for reoccurring indications may be performed as noted. The reflux portion of the exam is performed with the patient in reverse Trendelenburg.  +---------+---------------+---------+-----------+----------+--------------+ RIGHT    CompressibilityPhasicitySpontaneityPropertiesThrombus Aging +---------+---------------+---------+-----------+----------+--------------+ CFV      Full           Yes      Yes                                 +---------+---------------+---------+-----------+----------+--------------+ SFJ      Full                                                        +---------+---------------+---------+-----------+----------+--------------+ FV Prox  Full           Yes      Yes                                 +---------+---------------+---------+-----------+----------+--------------+ FV Mid   Full           Yes      Yes                                 +---------+---------------+---------+-----------+----------+--------------+  FV DistalFull           Yes      Yes                                 +---------+---------------+---------+-----------+----------+--------------+ PFV      Full                                                        +---------+---------------+---------+-----------+----------+--------------+  POP      Full           Yes      Yes                                 +---------+---------------+---------+-----------+----------+--------------+ PTV      Full                                                        +---------+---------------+---------+-----------+----------+--------------+ PERO     Full                                                        +---------+---------------+---------+-----------+----------+--------------+   +---------+---------------+---------+-----------+----------+--------------+ LEFT     CompressibilityPhasicitySpontaneityPropertiesThrombus Aging +---------+---------------+---------+-----------+----------+--------------+ CFV      Full           Yes      Yes                                 +---------+---------------+---------+-----------+----------+--------------+ SFJ      Full                                                        +---------+---------------+---------+-----------+----------+--------------+ FV Prox  Full           Yes      Yes                                 +---------+---------------+---------+-----------+----------+--------------+ FV Mid   Full           Yes      Yes                                 +---------+---------------+---------+-----------+----------+--------------+ FV DistalFull           Yes      Yes                                 +---------+---------------+---------+-----------+----------+--------------+ PFV      Full                                                        +---------+---------------+---------+-----------+----------+--------------+  POP      Full           Yes      Yes                                 +---------+---------------+---------+-----------+----------+--------------+ PTV      Full                                                        +---------+---------------+---------+-----------+----------+--------------+ PERO     Full                                                         +---------+---------------+---------+-----------+----------+--------------+     Summary: BILATERAL: - No evidence of deep vein thrombosis seen in the lower extremities, bilaterally. BLE subcutaneous edema from mid thighs to calves. - RIGHT: - A cystic structure is found in the popliteal fossa.  LEFT: - No cystic structure found in the popliteal fossa.  *See table(s) above for measurements and observations. Electronically signed by Heath Larkhomas Hawken on 11/14/2020 at 5:46:17 PM.    Final    US Abdomen Limited RUQ (LIVER/GB)  Result Date: 11/11/2020 CLINICAL DATA:  Cirrhosis.  Ascites.  Abdominal distention. EXAM: ULTRASOUND ABDOMEN LIMITED RIGHT UPPER QUADRANT COMPARISON:  No prior. FINDINGS: Gallbladder: No gallstones or wall thickening visualized. No sonographic Murphy sign noted by sonographer. Common bile duct: Diameter: 5.4 mm Liver: Increased echogenicity and nodular irregular cortex consistent with cirrhosis. No focal hepatic abnormality identified. Portal vein is patent on color Doppler imaging with normal direction of blood flow towards the liver. Other: Prominent ascites. IMPRESSION: 1. No gallstones or biliary distention. 2. Increased hepatic echogenicity and nodular hepatic cortex consistent with cirrhosis. No focal hepatic abnormality identified. 3. Prominent ascites. Electronically Signed   By: Maisie Fushomas  Register   On: 11/11/2020 06:58   IR Paracentesis  Result Date: 11/13/2020 INDICATION: History of CAD with prior CABG, cirrhosis, and ascites. Request was made for therapeutic paracentesis up to 6 L. EXAM: ULTRASOUND GUIDED THERAPEUTIC PARACENTESIS MEDICATIONS: 12 mL 1% lidocaine COMPLICATIONS: None immediate. PROCEDURE: Informed written consent was obtained from the patient after a discussion of the risks, benefits and alternatives to treatment. A timeout was performed prior to the initiation of the procedure. Initial ultrasound scanning demonstrates a large amount of ascites  within the right lower abdominal quadrant. The right lower abdomen was prepped and draped in the usual sterile fashion. 1% lidocaine was used for local anesthesia. Following this, a 19 gauge, 15-cm, Yueh catheter was introduced. An ultrasound image was saved for documentation purposes. The paracentesis was performed. The catheter was removed and a dressing was applied. The patient tolerated the procedure well without immediate post procedural complication. Patient received post-procedure intravenous albumin; see nursing notes for details. FINDINGS: A total of approximately 6 L of clear yellow fluid was removed. IMPRESSION: Successful ultrasound-guided paracentesis yielding 6 liters of peritoneal fluid. Read by: Lawernce IonAimee Han, PA-C Electronically Signed   By: Corlis Leak  Hassell M.D.   On: 11/13/2020 16:18   IR Paracentesis  Result Date: 11/12/2020 INDICATION: Patient with history of COVID 19 pneumonia,  coronary artery disease with prior CABG, cirrhosis by imaging, ascites; request received for diagnostic and therapeutic paracentesis up to 5 liters. EXAM: ULTRASOUND GUIDED DIAGNOSTIC AND THERAPEUTIC  PARACENTESIS MEDICATIONS: 1% lidocaine to skin and SQ tissue COMPLICATIONS: None immediate. PROCEDURE: Informed written consent was obtained from the patient after a discussion of the risks, benefits and alternatives to treatment. A timeout was performed prior to the initiation of the procedure. Initial ultrasound scanning demonstrates a large amount of ascites within the left lower abdominal quadrant. The left lower abdomen was prepped and draped in the usual sterile fashion. 1% lidocaine was used for local anesthesia. Following this, a 19 gauge, 10-cm, Yueh catheter was introduced. An ultrasound image was saved for documentation purposes. The paracentesis was performed. The catheter was removed and a dressing was applied. The patient tolerated the procedure well without immediate post procedural complication. Patient received  post-procedure intravenous albumin; see nursing notes for details. FINDINGS: A total of approximately 5 liters of yellow fluid was removed. Samples were sent to the laboratory as requested by the clinical team. IMPRESSION: Successful ultrasound-guided diagnostic and therapeutic paracentesis yielding 5 liters of peritoneal fluid. Read by: Artemio Aly Electronically Signed   By: Marliss Coots MD   On: 11/12/2020 15:38

## 2020-11-16 ENCOUNTER — Inpatient Hospital Stay (HOSPITAL_COMMUNITY): Payer: Medicaid Other

## 2020-11-16 DIAGNOSIS — K7460 Unspecified cirrhosis of liver: Secondary | ICD-10-CM | POA: Diagnosis not present

## 2020-11-16 DIAGNOSIS — I5033 Acute on chronic diastolic (congestive) heart failure: Secondary | ICD-10-CM | POA: Diagnosis not present

## 2020-11-16 DIAGNOSIS — R188 Other ascites: Secondary | ICD-10-CM | POA: Diagnosis not present

## 2020-11-16 HISTORY — PX: IR PARACENTESIS: IMG2679

## 2020-11-16 LAB — CBC WITH DIFFERENTIAL/PLATELET
Abs Immature Granulocytes: 0.03 10*3/uL (ref 0.00–0.07)
Basophils Absolute: 0 10*3/uL (ref 0.0–0.1)
Basophils Relative: 0 %
Eosinophils Absolute: 0 10*3/uL (ref 0.0–0.5)
Eosinophils Relative: 0 %
HCT: 29.8 % — ABNORMAL LOW (ref 39.0–52.0)
Hemoglobin: 9.7 g/dL — ABNORMAL LOW (ref 13.0–17.0)
Immature Granulocytes: 1 %
Lymphocytes Relative: 6 %
Lymphs Abs: 0.3 10*3/uL — ABNORMAL LOW (ref 0.7–4.0)
MCH: 30.4 pg (ref 26.0–34.0)
MCHC: 32.6 g/dL (ref 30.0–36.0)
MCV: 93.4 fL (ref 80.0–100.0)
Monocytes Absolute: 0.2 10*3/uL (ref 0.1–1.0)
Monocytes Relative: 3 %
Neutro Abs: 5.6 10*3/uL (ref 1.7–7.7)
Neutrophils Relative %: 90 %
Platelets: 216 10*3/uL (ref 150–400)
RBC: 3.19 MIL/uL — ABNORMAL LOW (ref 4.22–5.81)
RDW: 14 % (ref 11.5–15.5)
WBC: 6.2 10*3/uL (ref 4.0–10.5)
nRBC: 0 % (ref 0.0–0.2)

## 2020-11-16 LAB — MAGNESIUM: Magnesium: 2.2 mg/dL (ref 1.7–2.4)

## 2020-11-16 LAB — GLUCOSE, CAPILLARY
Glucose-Capillary: 178 mg/dL — ABNORMAL HIGH (ref 70–99)
Glucose-Capillary: 118 mg/dL — ABNORMAL HIGH (ref 70–99)
Glucose-Capillary: 180 mg/dL — ABNORMAL HIGH (ref 70–99)
Glucose-Capillary: 133 mg/dL — ABNORMAL HIGH (ref 70–99)

## 2020-11-16 LAB — COMPREHENSIVE METABOLIC PANEL
ALT: 11 U/L (ref 0–44)
AST: 16 U/L (ref 15–41)
Albumin: 2.8 g/dL — ABNORMAL LOW (ref 3.5–5.0)
Alkaline Phosphatase: 96 U/L (ref 38–126)
Anion gap: 8 (ref 5–15)
BUN: 29 mg/dL — ABNORMAL HIGH (ref 8–23)
CO2: 24 mmol/L (ref 22–32)
Calcium: 8.6 mg/dL — ABNORMAL LOW (ref 8.9–10.3)
Chloride: 97 mmol/L — ABNORMAL LOW (ref 98–111)
Creatinine, Ser: 1.38 mg/dL — ABNORMAL HIGH (ref 0.61–1.24)
GFR, Estimated: 57 mL/min — ABNORMAL LOW (ref >60)
Glucose, Bld: 253 mg/dL — ABNORMAL HIGH (ref 70–99)
Potassium: 4.5 mmol/L (ref 3.5–5.1)
Sodium: 129 mmol/L — ABNORMAL LOW (ref 135–145)
Total Bilirubin: 0.8 mg/dL (ref 0.3–1.2)
Total Protein: 6 g/dL — ABNORMAL LOW (ref 6.5–8.1)

## 2020-11-16 LAB — PROTEIN, PLEURAL OR PERITONEAL FLUID: Total protein, fluid: 3.5 g/dL

## 2020-11-16 LAB — ALBUMIN, PLEURAL OR PERITONEAL FLUID: Albumin, Fluid: 1.9 g/dL

## 2020-11-16 LAB — C-REACTIVE PROTEIN: CRP: 0.7 mg/dL (ref <1.0)

## 2020-11-16 LAB — D-DIMER, QUANTITATIVE: D-Dimer, Quant: 9 ug/mL-FEU — ABNORMAL HIGH (ref 0.00–0.50)

## 2020-11-16 MED ORDER — LIDOCAINE HCL 1 % IJ SOLN
INTRAMUSCULAR | Status: AC
  Filled 2020-11-16: qty 20

## 2020-11-16 NOTE — Procedures (Signed)
PROCEDURE SUMMARY:  Successful US guided paracentesis from left abdomen.  Yielded 8.5 L of amber-colored fluid.  No immediate complications.  Pt tolerated well.   Specimen sent for labs.  EBL < 1 mL  Mickie Kay, NP 11/16/2020 2:02 PM

## 2020-11-16 NOTE — Progress Notes (Signed)
PROGRESS NOTE                                                                                                                                                                                                             Patient Demographics:    Paul SandyKenneth Mclaughlin, is a 65 y.o. male, DOB - 1955-10-11, ZOX:[id number]  Outpatient Primary MD for the patient is [name], Enobong, MD    LOS - 5  Admit date - 11/10/2020    Chief Complaint  Patient presents with  . Shortness of Breath  . Congestive Heart Failure       Brief Narrative (HPI from H&P)   this is a 65 year old Caucasian gentleman with history of VSD, CAD s/p CABG, hypertension, dyslipidemia, paroxysmal A. fib on Xarelto for anticoagulation who has taken 2 shots of COVID-19 mRNA vaccine so far presents to the hospital with 7 to 10-day history of progressive shortness of breath and some increasing edema, he was in the ER and was diagnosed with acute COVID-19 infection/pneumonia along with acute on chronic CHF exacerbation.  He was also noted to have abdominal distention with possible ascites causing shortness of breath   Subjective:   Patient in bed, appears comfortable, denies any headache, no fever, no chest pain or pressure, no shortness of breath , no abdominal pain. No focal weakness.   Assessment  & Plan :     1. Acute Hypoxic Resp. Failure due to Acute Covid 19 Viral Pneumonitis long with possible acute on chronic diastolic CHF percent and massive ascites causing diaphragmatic compression- he is vaccinated and could have had mild COVID-19 pneumonia, will place him on moderate dose IV steroids along with remdesivir, monitor inflammatory marker.  Has been diuresed with Lasix, I think there is some element of undiagnosed ascites which is contributing to his shortness of breath as well, kindly see #4 below.  Encouraged the patient to sit up in chair in the daytime use I-S and  flutter valve for pulmonary toiletry and then prone in bed when at night.  Will advance activity and titrate down oxygen as possible.  SpO2: 96 % O2 Flow Rate (L/min): 2 L/min  Recent Labs  Lab 11/10/20 1946 11/10/20 2241 11/11/20 0404 11/12/20 0134 11/13/20 0124 11/14/20 0409 11/15/20 0411 11/16/20 0129  WBC  --   --    < >  4.3 5.3 5.3 6.8 6.2  HGB  --   --    < > 9.1* 8.7* 8.8* 9.4* 9.7*  HCT  --   --    < > 29.1* 27.0* 26.3* 27.5* 29.8*  PLT  --   --    < > 234 198 196 201 216  CRP  --   --    < > 1.2* 0.8 0.7 0.7 0.7  BNP 693.0*  --   --  395.2* 442.0* 439.8* 417.0*  --   DDIMER  --   --    < > 8.64* 7.88* 6.23* 7.36* 9.00*  AST 20  --    < > 21 13* 13* 17 16  ALT 11  --    < > ALKPHOS 113  --    < > 102 83 79 87 96  BILITOT 1.7*  --    < > 1.3* 1.0 0.9 0.8 0.8  ALBUMIN 3.3*  --    < > 3.0* 2.6* 2.6* 2.8* 2.8*  SARSCOV2NAA  --  POSITIVE*  --   --   --   --   --   --    < > = values in this interval not displayed.    2. CAD s/p CABG. Currently chest pain-free no acute issues, has history of VSD. Currently stable no acute issues, continue statin, Xarelto echo shows preserved EF, discontinue ACE for now as he has developed mild AKI.  Only on Norvasc and hydralazine will change medications to beta-blocker and low-dose hydralazine.  3. Paroxysmal A. fib. [place] vas 2 score of greater than 3.  Continue Xarelto, has A. fib with low ventricular response hence no beta-blocker.  Stable TSH.  4. New diagnosis of cirrhosis and ascites with history of severe pulmonary venous hypertension diagnosed in 2018 - he has undergone 2 ultrasound-guided paracentesis with total of about 10 L of fluid removed so far this admission, SAAG score under 1, repeating another paracentesis with protein counts on 11/15/2020 ( pending).  Diuretics as tolerated by blood pressure.  Acute hepatitis panel negative, this could be due to severe pulmonary venous hypertension, leg ultrasound shows no DVTs,  VQ scan negative for chronic PEs.  Already on full anticoagulation but D-dimer always stays high.  Looked at his right heart cath report from 2018 and reviewed with cardiologist Dr. Anne Fu.  Outpatient cardiology follow-up.  5. Dyslipidemia on statin.  6. GERD. PPI.  7.  AKI.  Cautious with diuretics, hold ACE inhibitor, avoid hypotension, blood pressure medications adjusted as well, since he is undergoing large volume paracentesis will hold further diuretics.  8. Hypretension.  Blood pressure soft currently off of blood pressure medications except for low-dose diuretics as tolerated.  9. DM2 - continue Farxiga, placed on moderate dose ISS along with Premeal NovoLog, monitor in presence of steroids  Lab Results  Component Value Date   HGBA1C 5.3 11/10/2020   CBG (last 3)  Recent Labs    11/15/20 1759 11/15/20 2049 11/16/20 0731  GLUCAP 175* 171* 178*        Condition -   Guarded  Family Communication  : Patient will keep his family and friends updated himself, no immediate family around.  Code Status :  Full  Consults  :  None  PUD Prophylaxis : PPI   Procedures  :     Therapeutic paracentesis 11/13/2020 with 6 L of fluid removed.    Ultrasound-guided paracentesis with 5 L of fluid  removed on 11/12/2020.  No signs of SBP, SAAG score surprisingly is under 1   right upper quadrant ultrasound.  Possible cirrhosis.  TTE - 1. Left ventricular ejection fraction, by estimation, is 55%. The left ventricle has normal function. The left ventricle has no regional wall motion abnormalities. Left ventricular diastolic parameters are indeterminate.  2. Right ventricular systolic function is moderately reduced. The right ventricular size is severely enlarged. There is severely elevated pulmonary artery systolic pressure. The estimated right ventricular systolic pressure is 73.7 mmHg. D-shaped interventricular septum suggestive of RV pressure/volume overload.  3. Left atrial size was  moderately dilated.  4. Right atrial size was moderately dilated.  5. The mitral valve is normal in structure. Trivial mitral valve regurgitation. No evidence of mitral stenosis.  6. Tricuspid valve regurgitation is mild to moderate.  7. The aortic valve is tricuspid. Aortic valve regurgitation is not visualized. Mild aortic valve sclerosis is present, with no evidence of aortic valve stenosis.  8. The inferior vena cava is dilated in size with <50% respiratory variability, suggesting right atrial pressure of 15 mmHg.  9. Prominent ascites noted. 10. The patient was in atrial fibrillation.       Disposition Plan  :    Status is: Inpatient  Dispo: The patient is from: Home              Anticipated d/c is to: Home              Patient currently is not medically stable to d/c.   Difficult to place patient No  DVT Prophylaxis  :  Xaralto  Lab Results  Component Value Date   PLT 216 11/16/2020    Diet :  Diet Order            Diet heart healthy/carb modified Room service appropriate? Yes; Fluid consistency: Thin  Diet effective now                  Inpatient Medications  Scheduled Meds: . albuterol  2 puff Inhalation Q6H  . vitamin C  500 mg Oral Daily  . atorvastatin  20 mg Oral Daily  . dapagliflozin propanediol  5 mg Oral QAC breakfast  . insulin aspart  0-15 Units Subcutaneous TID WC  . insulin aspart  0-5 Units Subcutaneous QHS  . insulin aspart  4 Units Subcutaneous TID WC  . loratadine  10 mg Oral Daily  . mouth rinse  15 mL Mouth Rinse BID  . methylPREDNISolone (SOLU-MEDROL) injection  40 mg Intravenous Daily  . pantoprazole  40 mg Oral Daily  . rivaroxaban  20 mg Oral Q supper  . zinc sulfate  220 mg Oral Daily   Continuous Infusions: . albumin human     PRN Meds:.acetaminophen, albumin human, albuterol, chlorpheniramine-HYDROcodone, lidocaine, ondansetron (ZOFRAN) IV  Antibiotics  :    Anti-infectives (From admission, onward)   Start     Dose/Rate Route  Frequency Ordered Stop   11/12/20 1000  remdesivir 100 mg in sodium chloride 0.9 % 100 mL IVPB  Status:  Discontinued       "Followed by" Linked Group Details   100 mg 200 mL/hr over 30 Minutes Intravenous Daily 11/11/20 0442 11/11/20 0822   11/12/20 1000  remdesivir 100 mg in sodium chloride 0.9 % 100 mL IVPB       "Followed by" Linked Group Details   100 mg 200 mL/hr over 30 Minutes Intravenous Daily 11/11/20 0822 11/15/20 0928   11/11/20 0500  remdesivir  200 mg in sodium chloride 0.9% 250 mL IVPB       "Followed by" Linked Group Details   200 mg 580 mL/hr over 30 Minutes Intravenous Once 11/11/20 0442 11/11/20 0726       Time Spent in minutes  30   Susa Raring M.D on 11/16/2020 at 9:54 AM  To page go to [email or web address]   [facility] -  Office  [phone]    See all Orders from today for further details    Objective:   Vitals:   11/15/20 0645 11/15/20 1505 11/15/20 2020 11/16/20 0533  BP: (!) 151/74 130/60 (!) 151/79 (!) 143/80  Pulse: (!) 54  79 (!) 58  Resp:  Temp: 97.8 F (36.6 C) 97.7 F (36.5 C) 97.7 F (36.5 C) 97.6 F (36.4 C)  TempSrc: Oral Oral Oral Oral  SpO2: 99%  95% 96%  Weight:      Height:        Wt Readings from Last 3 Encounters:  11/15/20 125.9 kg  11/10/20 (!) 138.8 kg  05/13/20 115.7 kg     Intake/Output Summary (Last 24 hours) at 11/16/2020 0954 Last data filed at 11/16/2020 0900 Gross per 24 hour  Intake 1460 ml  Output 5900 ml  Net -4440 ml     Physical Exam  Awake Alert, No new F.N deficits, Normal affect [place].AT,PERRAL Supple Neck,No JVD, No cervical lymphadenopathy appriciated.  Symmetrical Chest wall movement, Good air movement bilaterally, CTAB RRR,No Gallops, Rubs or new Murmurs, No Parasternal Heave +ve B.Sounds, Abd distended No Cyanosis, 1+ edema    Data Review:    CBC Recent Labs  Lab 11/12/20 0134 11/13/20 0124 11/14/20 0409 11/15/20 0411 11/16/20 0129  WBC 4.3 5.3 5.3 6.8 6.2  HGB  9.1* 8.7* 8.8* 9.4* 9.7*  HCT 29.1* 27.0* 26.3* 27.5* 29.8*  PLT 234 198 196 201 216  MCV 97.0 95.1 93.3 92.9 93.4  MCH 30.3 30.6 31.2 31.8 30.4  MCHC 31.3 32.2 33.5 34.2 32.6  RDW 15.2 14.6 14.3 14.3 14.0  LYMPHSABS 0.2* 0.3* 0.4* 0.4* 0.3*  MONOABS 0.1 0.2 0.3 0.3 0.2  EOSABS 0.0 0.0 0.0 0.0 0.0  BASOSABS 0.0 0.0 0.0 0.0 0.0    Recent Labs  Lab 11/10/20 1610 11/10/20 1722 11/10/20 1946 11/11/20 0404 11/11/20 0745 11/12/20 0134 11/13/20 0124 11/14/20 0409 11/15/20 0411 11/16/20 0129  NA  --    < >  --    < >  --  135 131* 130* 131* 129*  K  --    < >  --    < >  --  4.4 4.3 4.2 4.3 4.5  CL  --    < >  --    < >  --  99 98 97* 96* 97*  CO2  --    < >  --    < >  --  GLUCOSE  --    < >  --    < >  --  142* 169* 161* 168* 253*  BUN  --    < >  --    < >  --  22 25* 26* 27* 29*  CREATININE  --    < >  --    < >  --  1.67* 1.50* 1.29* 1.23 1.38*  CALCIUM  --    < >  --    < >  --  8.6* 8.5* 8.6* 8.8* 8.6*  AST  --   --  20   < >  --  21 13* 13* 17 16  ALT  --   --  11   < >  --  11 10 10 13 11   ALKPHOS  --   --  113   < >  --  102 83 79 87 96  BILITOT  --   --  1.7*   < >  --  1.3* 1.0 0.9 0.8 0.8  ALBUMIN  --   --  3.3*   < >  --  3.0* 2.6* 2.6* 2.8* 2.8*  MG  --   --   --    < >  --  2.1 1.9 2.0 2.2 2.2  CRP  --   --   --    < > 1.2* 1.2* 0.8 0.7 0.7 0.7  DDIMER  --   --   --    < > 8.35* 8.64* 7.88* 6.23* 7.36* 9.00*  TSH  --   --   --   --  4.321  --  3.036  --   --   --   HGBA1C 5.3  --   --   --   --   --   --   --   --   --   BNP  --   --  693.0*  --   --  395.2* 442.0* 439.8* 417.0*  --    < > = values in this interval not displayed.   ------------------------------------------------------------------------------------------------------------------ No results for input(s): CHOL, HDL, LDLCALC, TRIG, CHOLHDL, LDLDIRECT in the last 72 hours.  Lab Results  Component Value Date   HGBA1C 5.3 11/10/2020    ------------------------------------------------------------------------------------------------------------------ No results for input(s): TSH, T4TOTAL, T3FREE, THYROIDAB in the last 72 hours.  Invalid input(s): FREET3  Cardiac Enzymes No results for input(s): CKMB, TROPONINI, MYOGLOBIN in the last 168 hours.  Invalid input(s): CK ------------------------------------------------------------------------------------------------------------------    Component Value Date/Time   BNP 417.0 (H) 11/15/2020 0411   BNP 301.0 (H) 11/24/2016 1150    Micro Results Recent Results (from the past 240 hour(s))  Resp Panel by RT-PCR (Flu A&B, Covid) Nasopharyngeal Swab     Status: Abnormal   Collection Time: 11/10/20 10:41 PM   Specimen: Nasopharyngeal Swab; Nasopharyngeal(NP) swabs in vial transport medium  Result Value Ref Range Status   SARS Coronavirus 2 by RT PCR POSITIVE (A) NEGATIVE Final    Comment: RESULT CALLED TO, READ BACK BY AND VERIFIED WITH: E SULLIVAN RN 0011 11/11/20 A BROWNING (NOTE) SARS-CoV-2 target nucleic acids are DETECTED.  The SARS-CoV-2 RNA is generally detectable in upper respiratory specimens during the acute phase of infection. Positive results are indicative of the presence of the identified virus, but do not rule out bacterial infection or co-infection with other pathogens not detected by the test. Clinical correlation with patient history and other diagnostic information is necessary to determine patient infection status. The expected result is Negative.  Fact Sheet for Patients: BloggerCourse.com  Fact Sheet for Healthcare Providers: SeriousBroker.it  This test is not yet approved or cleared by the [place] FDA and  has been authorized for detection and/or diagnosis of SARS-CoV-2 by FDA under an Emergency Use Authorization (EUA).  This EUA will remain in effect (meaning this test can  be used) for the  duration of  the COVID-19 declaration under Section 564(b)(1) of the Act, 21 U.S.C. section 360bbb-3(b)(1), unless the authorization is terminated or revoked sooner.     Influenza A by PCR NEGATIVE NEGATIVE Final  Influenza B by PCR NEGATIVE NEGATIVE Final    Comment: (NOTE) The Xpert Xpress SARS-CoV-2/FLU/RSV plus assay is intended as an aid in the diagnosis of influenza from Nasopharyngeal swab specimens and should not be used as a sole basis for treatment. Nasal washings and aspirates are unacceptable for Xpert Xpress SARS-CoV-2/FLU/RSV testing.  Fact Sheet for Patients: BloggerCourse.com  Fact Sheet for Healthcare Providers: SeriousBroker.it  This test is not yet approved or cleared by the [place] FDA and has been authorized for detection and/or diagnosis of SARS-CoV-2 by FDA under an Emergency Use Authorization (EUA). This EUA will remain in effect (meaning this test can be used) for the duration of the COVID-19 declaration under Section 564(b)(1) of the Act, 21 U.S.C. section 360bbb-3(b)(1), unless the authorization is terminated or revoked.  Performed at [facility], [address] [place], [place] [address]   Culture, body fluid w Gram Stain-bottle     Status: None (Preliminary result)   Collection Time: 11/12/20  3:51 PM   Specimen: Fluid  Result Value Ref Range Status   Specimen Description FLUID PERITONEAL ABDOMEN  Final   Special Requests NONE  Final   Culture   Final    NO GROWTH 3 DAYS Performed at [facility], [address], [place], [place] [address]    Report Status PENDING  Incomplete  Gram stain     Status: None   Collection Time: 11/12/20  3:51 PM   Specimen: Fluid  Result Value Ref Range Status   Specimen Description FLUID PERITONEAL  Final   Special Requests ABDOMEN  Final   Gram Stain   Final    WBC PRESENT, PREDOMINANTLY MONONUCLEAR NO ORGANISMS SEEN CYTOSPIN  SMEAR Performed at [facility], [address], [place], [place] [address]    Report Status 11/13/2020 FINAL  Final    Radiology Reports DG Chest 2 View  Result Date: 11/10/2020 CLINICAL DATA:  Short of breath EXAM: CHEST - 2 VIEW COMPARISON:  03/30/2017 FINDINGS: Mild cardiomegaly with central vascular congestion. No overt edema or pleural effusion. No consolidation or pneumothorax. Old fracture deformity of the left clavicle. IMPRESSION: Cardiomegaly with mild central vascular congestion. Electronically Signed   By: Jasmine Pang M.D.   On: 11/10/2020 17:48   NM Pulmonary Perfusion  Result Date: 11/15/2020 CLINICAL DATA:  Shortness of breath EXAM: NUCLEAR MEDICINE PERFUSION LUNG SCAN TECHNIQUE: Perfusion images were obtained in multiple projections after intravenous injection of radiopharmaceutical. Ventilation scans intentionally deferred if perfusion scan and chest x-ray adequate for interpretation during COVID 19 epidemic. RADIOPHARMACEUTICALS:  4.3 mCi Tc-95m MAA IV COMPARISON:  November 15, 2020 radiograph FINDINGS: There is cardiomegaly. There is corresponding decreased perfusion following the contours of the cardiomegaly. Otherwise homogeneous distribution of radiotracer bilaterally. No focal wedge-shaped defect. IMPRESSION: PE absent.  Cardiomegaly. Electronically Signed   By: Meda Klinefelter MD   On: 11/15/2020 17:27   DG Chest Port 1 View  Result Date: 11/15/2020 CLINICAL DATA:  COVID-19 positive. EXAM: PORTABLE CHEST 1 VIEW COMPARISON:  November 14, 2020 FINDINGS: Stable cardiomegaly. The hila and mediastinum are unchanged. No pneumothorax. No nodules or masses. Mild opacity in the periphery of the left lung base. No other abnormalities. IMPRESSION: Mild opacity remains in the periphery of the left lung base. There may be a small effusion. No interval changes. Electronically Signed   By: Gerome Sam III M.D   On: 11/15/2020 13:02   DG Chest Port 1 View  Result Date:  11/14/2020 CLINICAL  DATA:  Shortness of breath.  History of COVID-19 pneumonia. EXAM: PORTABLE CHEST 1 VIEW COMPARISON:  November 12, 2020 FINDINGS: No pneumothorax. The cardiomediastinal silhouette is stable. No pulmonary nodules or masses. No focal infiltrates. No acute abnormalities. IMPRESSION: No active disease. Electronically Signed   By: Gerome Sam III M.D   On: 11/14/2020 09:07   DG Chest Port 1 View  Result Date: 11/12/2020 CLINICAL DATA:  Shortness of breath. EXAM: PORTABLE CHEST 1 VIEW COMPARISON:  November 10, 2020. FINDINGS: Stable cardiomegaly. No pneumothorax or pleural effusion is noted. Lungs are clear. Bony thorax is unremarkable. IMPRESSION: No active disease. Electronically Signed   By: Lupita Raider M.D.   On: 11/12/2020 09:10   ECHOCARDIOGRAM COMPLETE  Result Date: 11/11/2020    ECHOCARDIOGRAM REPORT   Patient Name:   TONG PIECZYNSKI Date of Exam: 11/11/2020 Medical Rec #:  [id number]       Height:       73.0 in Accession #:    [id number]      Weight:       306.0 lb Date of Birth:  21-May-1956       BSA:          2.578 m Patient Age:    64 years        BP:           134/74 mmHg Patient Gender: M               HR:           47 bpm. Exam Location:  Inpatient Procedure: 2D Echo, Cardiac Doppler and Color Doppler Indications:    I50.33 Acute on chronic diastolic (congestive) heart failure  History:        Patient has prior history of Echocardiogram examinations, most                 recent 11/28/2016. CHF, Arrythmias:Atrial Fibrillation,                 Signs/Symptoms:Dyspnea; Risk Factors:Diabetes. COVID-19                 Positive.  Sonographer:    Elmarie Shiley Dance Referring Phys: [id number] Deno Lunger SHAL[place] IMPRESSIONS  1. Left ventricular ejection fraction, by estimation, is 55%. The left ventricle has normal function. The left ventricle has no regional wall motion abnormalities. Left ventricular diastolic parameters are indeterminate.  2. Right ventricular systolic function is moderately reduced.  The right ventricular size is severely enlarged. There is severely elevated pulmonary artery systolic pressure. The estimated right ventricular systolic pressure is 73.7 mmHg. D-shaped interventricular septum suggestive of RV pressure/volume overload.  3. Left atrial size was moderately dilated.  4. Right atrial size was moderately dilated.  5. The mitral valve is normal in structure. Trivial mitral valve regurgitation. No evidence of mitral stenosis.  6. Tricuspid valve regurgitation is mild to moderate.  7. The aortic valve is tricuspid. Aortic valve regurgitation is not visualized. Mild aortic valve sclerosis is present, with no evidence of aortic valve stenosis.  8. The inferior vena cava is dilated in size with <50% respiratory variability, suggesting right atrial pressure of 15 mmHg.  9. Prominent ascites noted. 10. The patient was in atrial fibrillation. FINDINGS  Left Ventricle: Left ventricular ejection fraction, by estimation, is 55%. The left ventricle has normal function. The left ventricle has no regional wall motion abnormalities. The left ventricular internal cavity size was normal in size. There is no left ventricular hypertrophy. Left ventricular  diastolic parameters are indeterminate. Right Ventricle: The right ventricular size is severely enlarged. No increase in right ventricular wall thickness. Right ventricular systolic function is moderately reduced. There is severely elevated pulmonary artery systolic pressure. The tricuspid regurgitant velocity is 3.83 m/s, and with an assumed right atrial pressure of 15 mmHg, the estimated right ventricular systolic pressure is 73.7 mmHg. Left Atrium: Left atrial size was moderately dilated. Right Atrium: Right atrial size was moderately dilated. Pericardium: There is no evidence of pericardial effusion. Mitral Valve: The mitral valve is normal in structure. Mild mitral annular calcification. Trivial mitral valve regurgitation. No evidence of mitral valve  stenosis. Tricuspid Valve: The tricuspid valve is normal in structure. Tricuspid valve regurgitation is mild to moderate. Aortic Valve: The aortic valve is tricuspid. Aortic valve regurgitation is not visualized. Mild aortic valve sclerosis is present, with no evidence of aortic valve stenosis. Pulmonic Valve: The pulmonic valve was normal in structure. Pulmonic valve regurgitation is trivial. Aorta: The aortic root is normal in size and structure. Venous: The inferior vena cava is dilated in size with less than 50% respiratory variability, suggesting right atrial pressure of 15 mmHg. IAS/Shunts: No atrial level shunt detected by color flow Doppler.  LEFT VENTRICLE PLAX 2D LVIDd:         5.00 cm LVIDs:         2.70 cm LV PW:         1.50 cm LV IVS:        0.90 cm LVOT diam:     2.00 cm LV SV:         86 LV SV Index:   33 LVOT Area:     3.14 cm  RIGHT VENTRICLE          IVC RV Basal diam:  3.90 cm  IVC diam: 3.30 cm RV Mid diam:    2.60 cm TAPSE (M-mode): 1.8 cm LEFT ATRIUM              Index       RIGHT ATRIUM           Index LA diam:        5.40 cm  2.09 cm/m  RA Area:     27.40 cm LA Vol (A2C):   119.0 ml 46.16 ml/m RA Volume:   92.90 ml  36.04 ml/m LA Vol (A4C):   102.0 ml 39.57 ml/m LA Biplane Vol: 119.0 ml 46.16 ml/m  AORTIC VALVE LVOT Vmax:   119.50 cm/s LVOT Vmean:  78.700 cm/s LVOT VTI:    0.272 m  AORTA Ao Root diam: 3.50 cm Ao Asc diam:  3.20 cm MITRAL VALVE                TRICUSPID VALVE MV Area (PHT): 3.00 cm     TR Peak grad:   58.7 mmHg MV Decel Time: 253 msec     TR Vmax:        383.00 cm/s MV E velocity: 105.00 cm/s MV A velocity: 36.10 cm/s   SHUNTS MV E/A ratio:  2.91         Systemic VTI:  0.27 m                             Systemic Diam: 2.00 cm Marca Ancona MD Electronically signed by Marca Ancona MD Signature Date/Time: 11/11/2020/4:21:17 PM    Final    VAS [place] LOWER EXTREMITY VENOUS (DVT)  Result Date: 11/14/2020  Lower Venous DVT Study Indications: Edema, and Covid + elevated  D-dimer.  Risk Factors: CHF - post paracentesis. Comparison Study: No previous exams Performing Technologist: Ernestene Mention  Examination Guidelines: A complete evaluation includes B-mode imaging, spectral Doppler, color Doppler, and power Doppler as needed of all accessible portions of each vessel. Bilateral testing is considered an integral part of a complete examination. Limited examinations for reoccurring indications may be performed as noted. The reflux portion of the exam is performed with the patient in reverse Trendelenburg.  +---------+---------------+---------+-----------+----------+--------------+ RIGHT    CompressibilityPhasicitySpontaneityPropertiesThrombus Aging +---------+---------------+---------+-----------+----------+--------------+ CFV      Full           Yes      Yes                                 +---------+---------------+---------+-----------+----------+--------------+ SFJ      Full                                                        +---------+---------------+---------+-----------+----------+--------------+ FV Prox  Full           Yes      Yes                                 +---------+---------------+---------+-----------+----------+--------------+ FV Mid   Full           Yes      Yes                                 +---------+---------------+---------+-----------+----------+--------------+ FV DistalFull           Yes      Yes                                 +---------+---------------+---------+-----------+----------+--------------+ PFV      Full                                                        +---------+---------------+---------+-----------+----------+--------------+ POP      Full           Yes      Yes                                 +---------+---------------+---------+-----------+----------+--------------+ PTV      Full                                                         +---------+---------------+---------+-----------+----------+--------------+ PERO     Full                                                        +---------+---------------+---------+-----------+----------+--------------+   +---------+---------------+---------+-----------+----------+--------------+  LEFT     CompressibilityPhasicitySpontaneityPropertiesThrombus Aging +---------+---------------+---------+-----------+----------+--------------+ CFV      Full           Yes      Yes                                 +---------+---------------+---------+-----------+----------+--------------+ SFJ      Full                                                        +---------+---------------+---------+-----------+----------+--------------+ FV Prox  Full           Yes      Yes                                 +---------+---------------+---------+-----------+----------+--------------+ FV Mid   Full           Yes      Yes                                 +---------+---------------+---------+-----------+----------+--------------+ FV DistalFull           Yes      Yes                                 +---------+---------------+---------+-----------+----------+--------------+ PFV      Full                                                        +---------+---------------+---------+-----------+----------+--------------+ POP      Full           Yes      Yes                                 +---------+---------------+---------+-----------+----------+--------------+ PTV      Full                                                        +---------+---------------+---------+-----------+----------+--------------+ PERO     Full                                                        +---------+---------------+---------+-----------+----------+--------------+     Summary: BILATERAL: - No evidence of deep vein thrombosis seen in the lower extremities, bilaterally. BLE subcutaneous  edema from mid thighs to calves. - RIGHT: - A cystic structure is found in the popliteal fossa.  LEFT: - No cystic structure found in the popliteal fossa.  *See table(s) above for measurements and observations. Electronically signed by Heath Lark on 11/14/2020  at 5:46:17 PM.    Final    US Abdomen Limited RUQ (LIVER/GB)  Result Date: 11/11/2020 CLINICAL DATA:  Cirrhosis.  Ascites.  Abdominal distention. EXAM: ULTRASOUND ABDOMEN LIMITED RIGHT UPPER QUADRANT COMPARISON:  No prior. FINDINGS: Gallbladder: No gallstones or wall thickening visualized. No sonographic Murphy sign noted by sonographer. Common bile duct: Diameter: 5.4 mm Liver: Increased echogenicity and nodular irregular cortex consistent with cirrhosis. No focal hepatic abnormality identified. Portal vein is patent on color Doppler imaging with normal direction of blood flow towards the liver. Other: Prominent ascites. IMPRESSION: 1. No gallstones or biliary distention. 2. Increased hepatic echogenicity and nodular hepatic cortex consistent with cirrhosis. No focal hepatic abnormality identified. 3. Prominent ascites. Electronically Signed   By: Maisie Fus  Register   On: 11/11/2020 06:58   IR Paracentesis  Result Date: 11/13/2020 INDICATION: History of CAD with prior CABG, cirrhosis, and ascites. Request was made for therapeutic paracentesis up to 6 L. EXAM: ULTRASOUND GUIDED THERAPEUTIC PARACENTESIS MEDICATIONS: 12 mL 1% lidocaine COMPLICATIONS: None immediate. PROCEDURE: Informed written consent was obtained from the patient after a discussion of the risks, benefits and alternatives to treatment. A timeout was performed prior to the initiation of the procedure. Initial ultrasound scanning demonstrates a large amount of ascites within the right lower abdominal quadrant. The right lower abdomen was prepped and draped in the usual sterile fashion. 1% lidocaine was used for local anesthesia. Following this, a 19 gauge, 15-cm, Yueh catheter was introduced.  An ultrasound image was saved for documentation purposes. The paracentesis was performed. The catheter was removed and a dressing was applied. The patient tolerated the procedure well without immediate post procedural complication. Patient received post-procedure intravenous albumin; see nursing notes for details. FINDINGS: A total of approximately 6 L of clear yellow fluid was removed. IMPRESSION: Successful ultrasound-guided paracentesis yielding 6 liters of peritoneal fluid. Read by: Lawernce Ion, PA-C Electronically Signed   By: Corlis Leak M.D.   On: 11/13/2020 16:18   IR Paracentesis  Result Date: 11/12/2020 INDICATION: Patient with history of COVID 19 pneumonia, coronary artery disease with prior CABG, cirrhosis by imaging, ascites; request received for diagnostic and therapeutic paracentesis up to 5 liters. EXAM: ULTRASOUND GUIDED DIAGNOSTIC AND THERAPEUTIC  PARACENTESIS MEDICATIONS: 1% lidocaine to skin and SQ tissue COMPLICATIONS: None immediate. PROCEDURE: Informed written consent was obtained from the patient after a discussion of the risks, benefits and alternatives to treatment. A timeout was performed prior to the initiation of the procedure. Initial ultrasound scanning demonstrates a large amount of ascites within the left lower abdominal quadrant. The left lower abdomen was prepped and draped in the usual sterile fashion. 1% lidocaine was used for local anesthesia. Following this, a 19 gauge, 10-cm, Yueh catheter was introduced. An ultrasound image was saved for documentation purposes. The paracentesis was performed. The catheter was removed and a dressing was applied. The patient tolerated the procedure well without immediate post procedural complication. Patient received post-procedure intravenous albumin; see nursing notes for details. FINDINGS: A total of approximately 5 liters of yellow fluid was removed. Samples were sent to the laboratory as requested by the clinical team. IMPRESSION:  Successful ultrasound-guided diagnostic and therapeutic paracentesis yielding 5 liters of peritoneal fluid. Read by: Artemio Aly Electronically Signed   By: Marliss Coots MD   On: 11/12/2020 15:38

## 2020-11-17 ENCOUNTER — Inpatient Hospital Stay (HOSPITAL_COMMUNITY): Payer: Medicaid Other

## 2020-11-17 DIAGNOSIS — J9811 Atelectasis: Secondary | ICD-10-CM | POA: Diagnosis not present

## 2020-11-17 DIAGNOSIS — J90 Pleural effusion, not elsewhere classified: Secondary | ICD-10-CM | POA: Diagnosis not present

## 2020-11-17 DIAGNOSIS — I5033 Acute on chronic diastolic (congestive) heart failure: Secondary | ICD-10-CM | POA: Diagnosis not present

## 2020-11-17 LAB — COMPREHENSIVE METABOLIC PANEL
ALT: 13 U/L (ref 0–44)
AST: 14 U/L — ABNORMAL LOW (ref 15–41)
Albumin: 2.8 g/dL — ABNORMAL LOW (ref 3.5–5.0)
Alkaline Phosphatase: 76 U/L (ref 38–126)
Anion gap: 7 (ref 5–15)
BUN: 28 mg/dL — ABNORMAL HIGH (ref 8–23)
CO2: 25 mmol/L (ref 22–32)
Calcium: 8.8 mg/dL — ABNORMAL LOW (ref 8.9–10.3)
Chloride: 99 mmol/L (ref 98–111)
Creatinine, Ser: 1.16 mg/dL (ref 0.61–1.24)
GFR, Estimated: >60 mL/min (ref >60)
Glucose, Bld: 125 mg/dL — ABNORMAL HIGH (ref 70–99)
Potassium: 4.7 mmol/L (ref 3.5–5.1)
Sodium: 131 mmol/L — ABNORMAL LOW (ref 135–145)
Total Bilirubin: 1 mg/dL (ref 0.3–1.2)
Total Protein: 5.8 g/dL — ABNORMAL LOW (ref 6.5–8.1)

## 2020-11-17 LAB — CBC
HCT: 29.5 % — ABNORMAL LOW (ref 39.0–52.0)
Hemoglobin: 10 g/dL — ABNORMAL LOW (ref 13.0–17.0)
MCH: 31.4 pg (ref 26.0–34.0)
MCHC: 33.9 g/dL (ref 30.0–36.0)
MCV: 92.8 fL (ref 80.0–100.0)
Platelets: 224 10*3/uL (ref 150–400)
RBC: 3.18 MIL/uL — ABNORMAL LOW (ref 4.22–5.81)
RDW: 13.9 % (ref 11.5–15.5)
WBC: 5.8 10*3/uL (ref 4.0–10.5)
nRBC: 0 % (ref 0.0–0.2)

## 2020-11-17 LAB — GLUCOSE, CAPILLARY
Glucose-Capillary: 128 mg/dL — ABNORMAL HIGH (ref 70–99)
Glucose-Capillary: 118 mg/dL — ABNORMAL HIGH (ref 70–99)
Glucose-Capillary: 132 mg/dL — ABNORMAL HIGH (ref 70–99)
Glucose-Capillary: 144 mg/dL — ABNORMAL HIGH (ref 70–99)

## 2020-11-17 LAB — CULTURE, BODY FLUID W GRAM STAIN -BOTTLE: Culture: NO GROWTH

## 2020-11-17 LAB — BRAIN NATRIURETIC PEPTIDE: B Natriuretic Peptide: 354.8 pg/mL — ABNORMAL HIGH (ref 0.0–100.0)

## 2020-11-17 MED ORDER — SPIRONOLACTONE 25 MG PO TABS
25.0000 mg | ORAL_TABLET | Freq: Every day | ORAL | Status: DC
  Administered 2020-11-17 – 2020-11-18 (×2): 25 mg via ORAL
  Filled 2020-11-17 (×2): qty 1

## 2020-11-17 MED ORDER — HYDRALAZINE HCL 25 MG PO TABS: 25.0000 mg | ORAL_TABLET | Freq: Three times a day (TID) | ORAL | Status: DC

## 2020-11-17 MED ORDER — FUROSEMIDE 40 MG PO TABS
60.0000 mg | ORAL_TABLET | Freq: Once | ORAL | Status: AC
  Administered 2020-11-17: 60 mg via ORAL
  Filled 2020-11-17: qty 1

## 2020-11-17 MED ORDER — AMLODIPINE BESYLATE 10 MG PO TABS
10.0000 mg | ORAL_TABLET | Freq: Every day | ORAL | Status: DC
  Administered 2020-11-17 – 2020-11-18 (×2): 10 mg via ORAL
  Filled 2020-11-17: qty 1
  Filled 2020-11-17: qty 2

## 2020-11-17 NOTE — Plan of Care (Signed)
  Problem: Education: Goal: Knowledge of General Education information will improve Description: Including pain rating scale, medication(s)/side effects and non-pharmacologic comfort measures Outcome: Adequate for Discharge   Problem: Health Behavior/Discharge Planning: Goal: Ability to manage health-related needs will improve Outcome: Adequate for Discharge   Problem: Clinical Measurements: Goal: Ability to maintain clinical measurements within normal limits will improve Outcome: Adequate for Discharge Goal: Will remain free from infection Outcome: Adequate for Discharge Goal: Diagnostic test results will improve Outcome: Adequate for Discharge Goal: Respiratory complications will improve Outcome: Adequate for Discharge Goal: Cardiovascular complication will be avoided Outcome: Adequate for Discharge   Problem: Activity: Goal: Risk for activity intolerance will decrease Outcome: Adequate for Discharge   Problem: Nutrition: Goal: Adequate nutrition will be maintained Outcome: Adequate for Discharge   Problem: Coping: Goal: Level of anxiety will decrease Outcome: Adequate for Discharge   Problem: Elimination: Goal: Will not experience complications related to bowel motility Outcome: Adequate for Discharge Goal: Will not experience complications related to urinary retention Outcome: Adequate for Discharge   Problem: Pain Managment: Goal: General experience of comfort will improve Outcome: Adequate for Discharge   Problem: Safety: Goal: Ability to remain free from injury will improve Outcome: Adequate for Discharge   Problem: Skin Integrity: Goal: Risk for impaired skin integrity will decrease Outcome: Adequate for Discharge   Problem: Education: Goal: Knowledge of risk factors and measures for prevention of condition will improve Outcome: Adequate for Discharge   Problem: Coping: Goal: Psychosocial and spiritual needs will be supported Outcome: Adequate for  Discharge   Problem: Respiratory: Goal: Will maintain a patent airway Outcome: Adequate for Discharge Goal: Complications related to the disease process, condition or treatment will be avoided or minimized Outcome: Adequate for Discharge   Problem: Education: Goal: Ability to demonstrate management of disease process will improve Outcome: Adequate for Discharge Goal: Ability to verbalize understanding of medication therapies will improve Outcome: Adequate for Discharge Goal: Individualized Educational Video(s) Outcome: Adequate for Discharge   Problem: Activity: Goal: Capacity to carry out activities will improve Outcome: Adequate for Discharge   Problem: Cardiac: Goal: Ability to achieve and maintain adequate cardiopulmonary perfusion will improve Outcome: Adequate for Discharge

## 2020-11-17 NOTE — Progress Notes (Signed)
PROGRESS NOTE                                                                                                                                                                                                             Patient Demographics:    Paul Mclaughlin, is a 65 y.o. male, DOB - 1956-02-19, ZOX:[id number]  Outpatient Primary MD for the patient is Paul Mclaughlin, Enobong, MD    LOS - 6  Admit date - 11/10/2020    Chief Complaint  Patient presents with  . Shortness of Breath  . Congestive Heart Failure       Brief Narrative (HPI from H&P)   this is a 65 year old Caucasian gentleman with history of VSD, CAD s/p CABG, hypertension, dyslipidemia, paroxysmal A. fib on Xarelto for anticoagulation who has taken 2 shots of COVID-19 mRNA vaccine so far presents to the hospital with 7 to 10-day history of progressive shortness of breath and some increasing edema, he was in the ER and was diagnosed with acute COVID-19 infection/pneumonia along with acute on chronic CHF exacerbation.  He was also noted to have abdominal distention with possible ascites causing shortness of breath.   Subjective:   Patient in bed, appears comfortable, denies any headache, no fever, no chest pain or pressure, no shortness of breath , no abdominal pain. No focal weakness.   Assessment  & Plan :     1. Acute Hypoxic Resp. Failure due to Acute Covid 19 Viral Pneumonitis long with possible acute on chronic diastolic CHF percent and massive ascites causing diaphragmatic compression- he is vaccinated and could have had mild COVID-19 pneumonia, will place him on moderate dose IV steroids along with remdesivir, monitor inflammatory marker.  Has been diuresed with Lasix, I think most of his shortness of breath was due to massive undiagnosed ascites, with treatment as in #2 below he is much better, continue diuresis as tolerated with Lasix dose daily.  Encouraged the  patient to sit up in chair in the daytime use I-S and flutter valve for pulmonary toiletry and then prone in bed when at night.  Will advance activity and titrate down oxygen as possible.  SpO2: 94 % O2 Flow Rate (L/min): 2 L/min  Recent Labs  Lab 11/10/20 1946 11/10/20 2241 11/11/20 0404 11/12/20 0134 11/13/20 0124 11/14/20 0409 11/15/20 0411 11/16/20 0129  WBC  --   --    < >  4.3 5.3 5.3 6.8 6.2  HGB  --   --    < > 9.1* 8.7* 8.8* 9.4* 9.7*  HCT  --   --    < > 29.1* 27.0* 26.3* 27.5* 29.8*  PLT  --   --    < > 234 198 196 201 216  CRP  --   --    < > 1.2* 0.8 0.7 0.7 0.7  BNP 693.0*  --   --  395.2* 442.0* 439.8* 417.0*  --   DDIMER  --   --    < > 8.64* 7.88* 6.23* 7.36* 9.00*  AST 20  --    < > 21 13* 13* 17 16  ALT 11  --    < > 11 10 10 13 11   ALKPHOS 113  --    < > 102 83 79 87 96  BILITOT 1.7*  --    < > 1.3* 1.0 0.9 0.8 0.8  ALBUMIN 3.3*  --    < > 3.0* 2.6* 2.6* 2.8* 2.8*  SARSCOV2NAA  --  POSITIVE*  --   --   --   --   --   --    < > = values in this interval not displayed.    2. New diagnosis of ascites with history of severe pulmonary venous hypertension diagnosed in 2018 - he has now undergone 3 ultrasound-guided paracentesis this admission with a total of 20 L of fluid removed, SAAG score less than 1 suggesting nonportal hypertension etiology, acute hepatitis panel negative, seen by GI.  Leg ultrasound shows no DVTs, VQ scan negative for chronic PEs.  Already on full anticoagulation but D-dimer always stays high.  Looked at his right heart cath report from 2018 and reviewed with cardiologist Dr. 2019.  Outpatient cardiology follow-up, he has severe pulmonary hypertension which is venous in etiology, for now his treatment is diuresis as tolerated with outpatient cardiology follow-up.  3. Paroxysmal A. fib. Anne Fu vas 2 score of greater than 3.  Continue Xarelto, has A. fib with slow ventricular response hence no beta-blocker.  Stable TSH.  4. CAD s/p CABG.  Currently chest pain-free no acute issues, has history of VSD. Currently stable no acute issues, continue statin, Xarelto echo shows preserved EF, avoiding beta-blocker due to bradycardia, blood pressure medications will be adjusted further on 11/17/2020.  5. Dyslipidemia on statin.  6. GERD. PPI.  7.  AKI.  He had massive fluid overload due to ascites, 20 L of fluid removed so far, will monitor renal function closely and avoid nephrotoxins, avoid large drop in blood pressure.  8. Hypretension.  Blood pressure has now improved, avoiding beta-blocker again due to bradycardia, will add Norvasc and monitor closely  9. DM2 - continue Farxiga, placed on moderate dose ISS along  monitor.  Lab Results  Component Value Date   HGBA1C 5.3 11/10/2020   CBG (last 3)  Recent Labs    11/16/20 1716 11/16/20 2039 11/17/20 0754  GLUCAP 180* 133* 128*        Condition -   Guarded  Family Communication  : Patient will keep his family and friends updated himself, no immediate family around.  Code Status :  Full  Consults  :  None  PUD Prophylaxis : PPI   Procedures  :     Therapeutic paracentesis 11/13/2020 with 6 L of fluid removed.    Ultrasound-guided paracentesis with 5 L of fluid removed on 11/12/2020.  No signs of SBP, SAAG  score surprisingly is under 1   Ultrasound-guided paracentesis on 11/16/2020 with 8-1/2 L of fluid removed.  SAAG score is still under 1  right upper quadrant ultrasound.  Possible cirrhosis.  TTE - 1. Left ventricular ejection fraction, by estimation, is 55%. The left ventricle has normal function. The left ventricle has no regional wall motion abnormalities. Left ventricular diastolic parameters are indeterminate.  2. Right ventricular systolic function is moderately reduced. The right ventricular size is severely enlarged. There is severely elevated pulmonary artery systolic pressure. The estimated right ventricular systolic pressure is 73.7 mmHg. D-shaped  interventricular septum suggestive of RV pressure/volume overload.  3. Left atrial size was moderately dilated.  4. Right atrial size was moderately dilated.  5. The mitral valve is normal in structure. Trivial mitral valve regurgitation. No evidence of mitral stenosis.  6. Tricuspid valve regurgitation is mild to moderate.  7. The aortic valve is tricuspid. Aortic valve regurgitation is not visualized. Mild aortic valve sclerosis is present, with no evidence of aortic valve stenosis.  8. The inferior vena cava is dilated in size with <50% respiratory variability, suggesting right atrial pressure of 15 mmHg.  9. Prominent ascites noted. 10. The patient was in atrial fibrillation.       Disposition Plan  :    Status is: Inpatient  Dispo: The patient is from: Home              Anticipated d/c is to: Home              Patient currently is not medically stable to d/c.   Difficult to place patient No  DVT Prophylaxis  :  Xaralto  Lab Results  Component Value Date   PLT 216 11/16/2020    Diet :  Diet Order            Diet heart healthy/carb modified Room service appropriate? Yes; Fluid consistency: Thin  Diet effective now                  Inpatient Medications  Scheduled Meds: . albuterol  2 puff Inhalation Q6H  . vitamin C  500 mg Oral Daily  . atorvastatin  20 mg Oral Daily  . dapagliflozin propanediol  5 mg Oral QAC breakfast  . furosemide  60 mg Oral Once  . insulin aspart  0-15 Units Subcutaneous TID WC  . insulin aspart  0-5 Units Subcutaneous QHS  . insulin aspart  4 Units Subcutaneous TID WC  . loratadine  10 mg Oral Daily  . mouth rinse  15 mL Mouth Rinse BID  . pantoprazole  40 mg Oral Daily  . rivaroxaban  20 mg Oral Q supper  . zinc sulfate  220 mg Oral Daily   Continuous Infusions: . albumin human     PRN Meds:.acetaminophen, albumin human, albuterol, chlorpheniramine-HYDROcodone, lidocaine, ondansetron (ZOFRAN) IV  Antibiotics  :    Anti-infectives (From  admission, onward)   Start     Dose/Rate Route Frequency Ordered Stop   11/12/20 1000  remdesivir 100 mg in sodium chloride 0.9 % 100 mL IVPB  Status:  Discontinued       "Followed by" Linked Group Details   100 mg 200 mL/hr over 30 Minutes Intravenous Daily 11/11/20 0442 11/11/20 0822   11/12/20 1000  remdesivir 100 mg in sodium chloride 0.9 % 100 mL IVPB       "Followed by" Linked Group Details   100 mg 200 mL/hr over 30 Minutes Intravenous Daily 11/11/20 0822 11/15/20  1610   11/11/20 0500  remdesivir 200 mg in sodium chloride 0.9% 250 mL IVPB       "Followed by" Linked Group Details   200 mg 580 mL/hr over 30 Minutes Intravenous Once 11/11/20 0442 11/11/20 0726       Time Spent in minutes  30   Susa Raring M.D on 11/17/2020 at 9:00 AM  To page go to [email or web address]   [facility] -  Office  [phone]    See all Orders from today for further details    Objective:   Vitals:   11/16/20 1436 11/16/20 2038 11/17/20 0500 11/17/20 0802  BP: (!) 152/96 (!) 162/79 (!) 158/88 (!) 147/72  Pulse: 75 75 78 (!) 57  Resp: Temp: 98.6 F (37 C) 98.5 F (36.9 C) 98.6 F (37 C)   TempSrc: Oral Oral Oral   SpO2: 96% 96% 95% 94%  Weight:      Height:        Wt Readings from Last 3 Encounters:  11/15/20 125.9 kg  11/10/20 (!) 138.8 kg  05/13/20 115.7 kg     Intake/Output Summary (Last 24 hours) at 11/17/2020 0900 Last data filed at 11/17/2020 0858 Gross per 24 hour  Intake 900 ml  Output 5425 ml  Net -4525 ml     Physical Exam  Awake Alert, No new F.N deficits, Normal affect [place].AT,PERRAL Supple Neck,No JVD, No cervical lymphadenopathy appriciated.  Symmetrical Chest wall movement, Good air movement bilaterally, CTAB RRR,No Gallops, Rubs or new Murmurs, No Parasternal Heave +ve B.Sounds, Abd distended No Cyanosis, 1+ edema    Data Review:    CBC Recent Labs  Lab 11/12/20 0134 11/13/20 0124 11/14/20 0409 11/15/20 0411 11/16/20 0129  WBC  4.3 5.3 5.3 6.8 6.2  HGB 9.1* 8.7* 8.8* 9.4* 9.7*  HCT 29.1* 27.0* 26.3* 27.5* 29.8*  PLT 234 198 196 201 216  MCV 97.0 95.1 93.3 92.9 93.4  MCH 30.3 30.6 31.2 31.8 30.4  MCHC 31.3 32.2 33.5 34.2 32.6  RDW 15.2 14.6 14.3 14.3 14.0  LYMPHSABS 0.2* 0.3* 0.4* 0.4* 0.3*  MONOABS 0.1 0.2 0.3 0.3 0.2  EOSABS 0.0 0.0 0.0 0.0 0.0  BASOSABS 0.0 0.0 0.0 0.0 0.0    Recent Labs  Lab 11/10/20 1610 11/10/20 1722 11/10/20 1946 11/11/20 0404 11/11/20 0745 11/12/20 0134 11/13/20 0124 11/14/20 0409 11/15/20 0411 11/16/20 0129  NA  --    < >  --    < >  --  135 131* 130* 131* 129*  K  --    < >  --    < >  --  4.4 4.3 4.2 4.3 4.5  CL  --    < >  --    < >  --  99 98 97* 96* 97*  CO2  --    < >  --    < >  --  GLUCOSE  --    < >  --    < >  --  142* 169* 161* 168* 253*  BUN  --    < >  --    < >  --  22 25* 26* 27* 29*  CREATININE  --    < >  --    < >  --  1.67* 1.50* 1.29* 1.23 1.38*  CALCIUM  --    < >  --    < >  --  8.6* 8.5* 8.6*  8.8* 8.6*  AST  --   --  20   < >  --  21 13* 13* 17 16  ALT  --   --  11   < >  --  ALKPHOS  --   --  113   < >  --  102 83 79 87 96  BILITOT  --   --  1.7*   < >  --  1.3* 1.0 0.9 0.8 0.8  ALBUMIN  --   --  3.3*   < >  --  3.0* 2.6* 2.6* 2.8* 2.8*  MG  --   --   --    < >  --  2.1 1.9 2.0 2.2 2.2  CRP  --   --   --    < > 1.2* 1.2* 0.8 0.7 0.7 0.7  DDIMER  --   --   --    < > 8.35* 8.64* 7.88* 6.23* 7.36* 9.00*  TSH  --   --   --   --  4.321  --  3.036  --   --   --   HGBA1C 5.3  --   --   --   --   --   --   --   --   --   BNP  --   --  693.0*  --   --  395.2* 442.0* 439.8* 417.0*  --    < > = values in this interval not displayed.   ------------------------------------------------------------------------------------------------------------------ No results for input(s): CHOL, HDL, LDLCALC, TRIG, CHOLHDL, LDLDIRECT in the last 72 hours.  Lab Results  Component Value Date   HGBA1C 5.3 11/10/2020    ------------------------------------------------------------------------------------------------------------------ No results for input(s): TSH, T4TOTAL, T3FREE, THYROIDAB in the last 72 hours.  Invalid input(s): FREET3  Cardiac Enzymes No results for input(s): CKMB, TROPONINI, MYOGLOBIN in the last 168 hours.  Invalid input(s): CK ------------------------------------------------------------------------------------------------------------------    Component Value Date/Time   BNP 417.0 (H) 11/15/2020 0411   BNP 301.0 (H) 11/24/2016 1150    Micro Results Recent Results (from the past 240 hour(s))  Resp Panel by RT-PCR (Flu A&B, Covid) Nasopharyngeal Swab     Status: Abnormal   Collection Time: 11/10/20 10:41 PM   Specimen: Nasopharyngeal Swab; Nasopharyngeal(NP) swabs in vial transport medium  Result Value Ref Range Status   SARS Coronavirus 2 by RT PCR POSITIVE (A) NEGATIVE Final    Comment: RESULT CALLED TO, READ BACK BY AND VERIFIED WITH: E SULLIVAN RN 0011 11/11/20 A BROWNING (NOTE) SARS-CoV-2 target nucleic acids are DETECTED.  The SARS-CoV-2 RNA is generally detectable in upper respiratory specimens during the acute phase of infection. Positive results are indicative of the presence of the identified virus, but do not rule out bacterial infection or co-infection with other pathogens not detected by the test. Clinical correlation with patient history and other diagnostic information is necessary to determine patient infection status. The expected result is Negative.  Fact Sheet for Patients: BloggerCourse.com  Fact Sheet for Healthcare Providers: SeriousBroker.it  This test is not yet approved or cleared by the [place] FDA and  has been authorized for detection and/or diagnosis of SARS-CoV-2 by FDA under an Emergency Use Authorization (EUA).  This EUA will remain in effect (meaning this test can  be used) for the  duration of  the COVID-19 declaration under Section 564(b)(1) of the Act, 21 U.S.C. section 360bbb-3(b)(1), unless the authorization is terminated or revoked sooner.  Influenza A by PCR NEGATIVE NEGATIVE Final   Influenza B by PCR NEGATIVE NEGATIVE Final    Comment: (NOTE) The Xpert Xpress SARS-CoV-2/FLU/RSV plus assay is intended as an aid in the diagnosis of influenza from Nasopharyngeal swab specimens and should not be used as a sole basis for treatment. Nasal washings and aspirates are unacceptable for Xpert Xpress SARS-CoV-2/FLU/RSV testing.  Fact Sheet for Patients: BloggerCourse.com  Fact Sheet for Healthcare Providers: SeriousBroker.it  This test is not yet approved or cleared by the [place] FDA and has been authorized for detection and/or diagnosis of SARS-CoV-2 by FDA under an Emergency Use Authorization (EUA). This EUA will remain in effect (meaning this test can be used) for the duration of the COVID-19 declaration under Section 564(b)(1) of the Act, 21 U.S.C. section 360bbb-3(b)(1), unless the authorization is terminated or revoked.  Performed at [facility], [address] [place], [place] [address]   Culture, body fluid w Gram Stain-bottle     Status: None (Preliminary result)   Collection Time: 11/12/20  3:51 PM   Specimen: Fluid  Result Value Ref Range Status   Specimen Description FLUID PERITONEAL ABDOMEN  Final   Special Requests NONE  Final   Culture   Final    NO GROWTH 4 DAYS Performed at [facility], [address] [place], [place] [address]    Report Status PENDING  Incomplete  Gram stain     Status: None   Collection Time: 11/12/20  3:51 PM   Specimen: Fluid  Result Value Ref Range Status   Specimen Description FLUID PERITONEAL  Final   Special Requests ABDOMEN  Final   Gram Stain   Final    WBC PRESENT, PREDOMINANTLY MONONUCLEAR NO ORGANISMS SEEN CYTOSPIN  SMEAR Performed at [facility], [address], [place], [place] [address]    Report Status 11/13/2020 FINAL  Final    Radiology Reports DG Chest 2 View  Result Date: 11/10/2020 CLINICAL DATA:  Short of breath EXAM: CHEST - 2 VIEW COMPARISON:  03/30/2017 FINDINGS: Mild cardiomegaly with central vascular congestion. No overt edema or pleural effusion. No consolidation or pneumothorax. Old fracture deformity of the left clavicle. IMPRESSION: Cardiomegaly with mild central vascular congestion. Electronically Signed   By: Jasmine Pang M.D.   On: 11/10/2020 17:48   NM Pulmonary Perfusion  Result Date: 11/15/2020 CLINICAL DATA:  Shortness of breath EXAM: NUCLEAR MEDICINE PERFUSION LUNG SCAN TECHNIQUE: Perfusion images were obtained in multiple projections after intravenous injection of radiopharmaceutical. Ventilation scans intentionally deferred if perfusion scan and chest x-ray adequate for interpretation during COVID 19 epidemic. RADIOPHARMACEUTICALS:  4.3 mCi Tc-96m MAA IV COMPARISON:  November 15, 2020 radiograph FINDINGS: There is cardiomegaly. There is corresponding decreased perfusion following the contours of the cardiomegaly. Otherwise homogeneous distribution of radiotracer bilaterally. No focal wedge-shaped defect. IMPRESSION: PE absent.  Cardiomegaly. Electronically Signed   By: Meda Klinefelter MD   On: 11/15/2020 17:27   DG Chest Port 1 View  Result Date: 11/17/2020 CLINICAL DATA:  Shortness of breath EXAM: PORTABLE CHEST 1 VIEW COMPARISON:  November 15, 2020 at November 14, 2020 FINDINGS: There has been interval clearing of opacity in the left base. There is an equivocal left pleural effusion. There is no appreciable edema or airspace opacity elsewhere. There is slight atelectasis in the left mid lung. Heart is mildly enlarged with pulmonary vascularity normal. No adenopathy. There is an old healed fracture of the left clavicle. IMPRESSION: Interval clearing of opacity from  the left base.  Equivocal left pleural effusion currently present with mild left midlung atelectasis. Lungs elsewhere clear. Stable cardiac prominence. Electronically Signed   By: Bretta Bang III M.D.   On: 11/17/2020 08:12   DG Chest Port 1 View  Result Date: 11/15/2020 CLINICAL DATA:  COVID-19 positive. EXAM: PORTABLE CHEST 1 VIEW COMPARISON:  November 14, 2020 FINDINGS: Stable cardiomegaly. The hila and mediastinum are unchanged. No pneumothorax. No nodules or masses. Mild opacity in the periphery of the left lung base. No other abnormalities. IMPRESSION: Mild opacity remains in the periphery of the left lung base. There may be a small effusion. No interval changes. Electronically Signed   By: Gerome Sam III M.D   On: 11/15/2020 13:02   DG Chest Port 1 View  Result Date: 11/14/2020 CLINICAL DATA:  Shortness of breath.  History of COVID-19 pneumonia. EXAM: PORTABLE CHEST 1 VIEW COMPARISON:  November 12, 2020 FINDINGS: No pneumothorax. The cardiomediastinal silhouette is stable. No pulmonary nodules or masses. No focal infiltrates. No acute abnormalities. IMPRESSION: No active disease. Electronically Signed   By: Gerome Sam III M.D   On: 11/14/2020 09:07   DG Chest Port 1 View  Result Date: 11/12/2020 CLINICAL DATA:  Shortness of breath. EXAM: PORTABLE CHEST 1 VIEW COMPARISON:  November 10, 2020. FINDINGS: Stable cardiomegaly. No pneumothorax or pleural effusion is noted. Lungs are clear. Bony thorax is unremarkable. IMPRESSION: No active disease. Electronically Signed   By: Lupita Raider M.D.   On: 11/12/2020 09:10   ECHOCARDIOGRAM COMPLETE  Result Date: 11/11/2020    ECHOCARDIOGRAM REPORT   Patient Name:   WILHO SHARPLEY Date of Exam: 11/11/2020 Medical Rec #:  [id number]       Height:       73.0 in Accession #:    [id number]      Weight:       306.0 lb Date of Birth:  27-Aug-1956       BSA:          2.578 m Patient Age:    64 years        BP:           134/74 mmHg Patient Gender: M               HR:           47  bpm. Exam Location:  Inpatient Procedure: 2D Echo, Cardiac Doppler and Color Doppler Indications:    I50.33 Acute on chronic diastolic (congestive) heart failure  History:        Patient has prior history of Echocardiogram examinations, most                 recent 11/28/2016. CHF, Arrythmias:Atrial Fibrillation,                 Signs/Symptoms:Dyspnea; Risk Factors:Diabetes. COVID-19                 Positive.  Sonographer:    Elmarie Shiley Dance Referring Phys: [id number] Deno Lunger [place] IMPRESSIONS  1. Left ventricular ejection fraction, by estimation, is 55%. The left ventricle has normal function. The left ventricle has no regional wall motion abnormalities. Left ventricular diastolic parameters are indeterminate.  2. Right ventricular systolic function is moderately reduced. The right ventricular size is severely enlarged. There is severely elevated pulmonary artery systolic pressure. The estimated right ventricular systolic pressure is 73.7 mmHg. D-shaped interventricular septum suggestive of RV pressure/volume overload.  3. Left atrial size was moderately dilated.  4. Right atrial size was moderately dilated.  5. The mitral valve is normal in structure. Trivial mitral valve regurgitation. No evidence of mitral stenosis.  6. Tricuspid valve regurgitation is mild to moderate.  7. The aortic valve is tricuspid. Aortic valve regurgitation is not visualized. Mild aortic valve sclerosis is present, with no evidence of aortic valve stenosis.  8. The inferior vena cava is dilated in size with <50% respiratory variability, suggesting right atrial pressure of 15 mmHg.  9. Prominent ascites noted. 10. The patient was in atrial fibrillation. FINDINGS  Left Ventricle: Left ventricular ejection fraction, by estimation, is 55%. The left ventricle has normal function. The left ventricle has no regional wall motion abnormalities. The left ventricular internal cavity size was normal in size. There is no left ventricular hypertrophy.  Left ventricular diastolic parameters are indeterminate. Right Ventricle: The right ventricular size is severely enlarged. No increase in right ventricular wall thickness. Right ventricular systolic function is moderately reduced. There is severely elevated pulmonary artery systolic pressure. The tricuspid regurgitant velocity is 3.83 m/s, and with an assumed right atrial pressure of 15 mmHg, the estimated right ventricular systolic pressure is 73.7 mmHg. Left Atrium: Left atrial size was moderately dilated. Right Atrium: Right atrial size was moderately dilated. Pericardium: There is no evidence of pericardial effusion. Mitral Valve: The mitral valve is normal in structure. Mild mitral annular calcification. Trivial mitral valve regurgitation. No evidence of mitral valve stenosis. Tricuspid Valve: The tricuspid valve is normal in structure. Tricuspid valve regurgitation is mild to moderate. Aortic Valve: The aortic valve is tricuspid. Aortic valve regurgitation is not visualized. Mild aortic valve sclerosis is present, with no evidence of aortic valve stenosis. Pulmonic Valve: The pulmonic valve was normal in structure. Pulmonic valve regurgitation is trivial. Aorta: The aortic root is normal in size and structure. Venous: The inferior vena cava is dilated in size with less than 50% respiratory variability, suggesting right atrial pressure of 15 mmHg. IAS/Shunts: No atrial level shunt detected by color flow Doppler.  LEFT VENTRICLE PLAX 2D LVIDd:         5.00 cm LVIDs:         2.70 cm LV PW:         1.50 cm LV IVS:        0.90 cm LVOT diam:     2.00 cm LV SV:         86 LV SV Index:   33 LVOT Area:     3.14 cm  RIGHT VENTRICLE          IVC RV Basal diam:  3.90 cm  IVC diam: 3.30 cm RV Mid diam:    2.60 cm TAPSE (M-mode): 1.8 cm LEFT ATRIUM              Index       RIGHT ATRIUM           Index LA diam:        5.40 cm  2.09 cm/m  RA Area:     27.40 cm LA Vol (A2C):   119.0 ml 46.16 ml/m RA Volume:   92.90 ml   36.04 ml/m LA Vol (A4C):   102.0 ml 39.57 ml/m LA Biplane Vol: 119.0 ml 46.16 ml/m  AORTIC VALVE LVOT Vmax:   119.50 cm/s LVOT Vmean:  78.700 cm/s LVOT VTI:    0.272 m  AORTA Ao Root diam: 3.50 cm Ao Asc diam:  3.20 cm MITRAL VALVE  TRICUSPID VALVE MV Area (PHT): 3.00 cm     TR Peak grad:   58.7 mmHg MV Decel Time: 253 msec     TR Vmax:        383.00 cm/s MV E velocity: 105.00 cm/s MV A velocity: 36.10 cm/s   SHUNTS MV E/A ratio:  2.91         Systemic VTI:  0.27 m                             Systemic Diam: 2.00 cm Marca Ancona MD Electronically signed by Marca Ancona MD Signature Date/Time: 11/11/2020/4:21:17 PM    Final    VAS [place] LOWER EXTREMITY VENOUS (DVT)  Result Date: 11/14/2020  Lower Venous DVT Study Indications: Edema, and Covid + elevated D-dimer.  Risk Factors: CHF - post paracentesis. Comparison Study: No previous exams Performing Technologist: Ernestene Mention  Examination Guidelines: A complete evaluation includes B-mode imaging, spectral Doppler, color Doppler, and power Doppler as needed of all accessible portions of each vessel. Bilateral testing is considered an integral part of a complete examination. Limited examinations for reoccurring indications may be performed as noted. The reflux portion of the exam is performed with the patient in reverse Trendelenburg.  +---------+---------------+---------+-----------+----------+--------------+ RIGHT    CompressibilityPhasicitySpontaneityPropertiesThrombus Aging +---------+---------------+---------+-----------+----------+--------------+ CFV      Full           Yes      Yes                                 +---------+---------------+---------+-----------+----------+--------------+ SFJ      Full                                                        +---------+---------------+---------+-----------+----------+--------------+ FV Prox  Full           Yes      Yes                                  +---------+---------------+---------+-----------+----------+--------------+ FV Mid   Full           Yes      Yes                                 +---------+---------------+---------+-----------+----------+--------------+ FV DistalFull           Yes      Yes                                 +---------+---------------+---------+-----------+----------+--------------+ PFV      Full                                                        +---------+---------------+---------+-----------+----------+--------------+ POP      Full           Yes      Yes                                 +---------+---------------+---------+-----------+----------+--------------+  PTV      Full                                                        +---------+---------------+---------+-----------+----------+--------------+ PERO     Full                                                        +---------+---------------+---------+-----------+----------+--------------+   +---------+---------------+---------+-----------+----------+--------------+ LEFT     CompressibilityPhasicitySpontaneityPropertiesThrombus Aging +---------+---------------+---------+-----------+----------+--------------+ CFV      Full           Yes      Yes                                 +---------+---------------+---------+-----------+----------+--------------+ SFJ      Full                                                        +---------+---------------+---------+-----------+----------+--------------+ FV Prox  Full           Yes      Yes                                 +---------+---------------+---------+-----------+----------+--------------+ FV Mid   Full           Yes      Yes                                 +---------+---------------+---------+-----------+----------+--------------+ FV DistalFull           Yes      Yes                                  +---------+---------------+---------+-----------+----------+--------------+ PFV      Full                                                        +---------+---------------+---------+-----------+----------+--------------+ POP      Full           Yes      Yes                                 +---------+---------------+---------+-----------+----------+--------------+ PTV      Full                                                        +---------+---------------+---------+-----------+----------+--------------+  PERO     Full                                                        +---------+---------------+---------+-----------+----------+--------------+     Summary: BILATERAL: - No evidence of deep vein thrombosis seen in the lower extremities, bilaterally. BLE subcutaneous edema from mid thighs to calves. - RIGHT: - A cystic structure is found in the popliteal fossa.  LEFT: - No cystic structure found in the popliteal fossa.  *See table(s) above for measurements and observations. Electronically signed by Heath Lark on 11/14/2020 at 5:46:17 PM.    Final    US Abdomen Limited RUQ (LIVER/GB)  Result Date: 11/11/2020 CLINICAL DATA:  Cirrhosis.  Ascites.  Abdominal distention. EXAM: ULTRASOUND ABDOMEN LIMITED RIGHT UPPER QUADRANT COMPARISON:  No prior. FINDINGS: Gallbladder: No gallstones or wall thickening visualized. No sonographic Murphy sign noted by sonographer. Common bile duct: Diameter: 5.4 mm Liver: Increased echogenicity and nodular irregular cortex consistent with cirrhosis. No focal hepatic abnormality identified. Portal vein is patent on color Doppler imaging with normal direction of blood flow towards the liver. Other: Prominent ascites. IMPRESSION: 1. No gallstones or biliary distention. 2. Increased hepatic echogenicity and nodular hepatic cortex consistent with cirrhosis. No focal hepatic abnormality identified. 3. Prominent ascites. Electronically Signed   By: Maisie Fus  Register    On: 11/11/2020 06:58   IR Paracentesis  Result Date: 11/16/2020 INDICATION: History of CAD with prior CABG, cirrhosis, and ascites. Request was made for therapeutic and diagnostic paracentesis EXAM: ULTRASOUND GUIDED PARACENTESIS MEDICATIONS: 1% lidocaine 10 mL COMPLICATIONS: None immediate. PROCEDURE: Informed written consent was obtained from the patient after a discussion of the risks, benefits and alternatives to treatment. A timeout was performed prior to the initiation of the procedure. Initial ultrasound scanning demonstrates a large amount of ascites within the left lower abdominal quadrant. The left lower abdomen was prepped and draped in the usual sterile fashion. 1% lidocaine was used for local anesthesia. Following this, a 19 gauge, 7-cm, Yueh catheter was introduced. An ultrasound image was saved for documentation purposes. The paracentesis was performed. The catheter was removed and a dressing was applied. The patient tolerated the procedure well without immediate post procedural complication. Patient received post-procedure intravenous albumin; see nursing notes for details. FINDINGS: A total of approximately 8.5 L of amber-colored fluid was removed. Samples were sent to the laboratory as requested by the clinical team. IMPRESSION: Successful ultrasound-guided paracentesis yielding 8.5 liters of peritoneal fluid. Read by: Alwyn Ren, NP Electronically Signed   By: Judie Petit.  Shick M.D.   On: 11/16/2020 14:02   IR Paracentesis  Result Date: 11/13/2020 INDICATION: History of CAD with prior CABG, cirrhosis, and ascites. Request was made for therapeutic paracentesis up to 6 L. EXAM: ULTRASOUND GUIDED THERAPEUTIC PARACENTESIS MEDICATIONS: 12 mL 1% lidocaine COMPLICATIONS: None immediate. PROCEDURE: Informed written consent was obtained from the patient after a discussion of the risks, benefits and alternatives to treatment. A timeout was performed prior to the initiation of the procedure. Initial  ultrasound scanning demonstrates a large amount of ascites within the right lower abdominal quadrant. The right lower abdomen was prepped and draped in the usual sterile fashion. 1% lidocaine was used for local anesthesia. Following this, a 19 gauge, 15-cm, Yueh catheter was introduced. An ultrasound image was saved for documentation purposes. The paracentesis was  performed. The catheter was removed and a dressing was applied. The patient tolerated the procedure well without immediate post procedural complication. Patient received post-procedure intravenous albumin; see nursing notes for details. FINDINGS: A total of approximately 6 L of clear yellow fluid was removed. IMPRESSION: Successful ultrasound-guided paracentesis yielding 6 liters of peritoneal fluid. Read by: Lawernce Ion, PA-C Electronically Signed   By: Corlis Leak M.D.   On: 11/13/2020 16:18   IR Paracentesis  Result Date: 11/12/2020 INDICATION: Patient with history of COVID 19 pneumonia, coronary artery disease with prior CABG, cirrhosis by imaging, ascites; request received for diagnostic and therapeutic paracentesis up to 5 liters. EXAM: ULTRASOUND GUIDED DIAGNOSTIC AND THERAPEUTIC  PARACENTESIS MEDICATIONS: 1% lidocaine to skin and SQ tissue COMPLICATIONS: None immediate. PROCEDURE: Informed written consent was obtained from the patient after a discussion of the risks, benefits and alternatives to treatment. A timeout was performed prior to the initiation of the procedure. Initial ultrasound scanning demonstrates a large amount of ascites within the left lower abdominal quadrant. The left lower abdomen was prepped and draped in the usual sterile fashion. 1% lidocaine was used for local anesthesia. Following this, a 19 gauge, 10-cm, Yueh catheter was introduced. An ultrasound image was saved for documentation purposes. The paracentesis was performed. The catheter was removed and a dressing was applied. The patient tolerated the procedure well without  immediate post procedural complication. Patient received post-procedure intravenous albumin; see nursing notes for details. FINDINGS: A total of approximately 5 liters of yellow fluid was removed. Samples were sent to the laboratory as requested by the clinical team. IMPRESSION: Successful ultrasound-guided diagnostic and therapeutic paracentesis yielding 5 liters of peritoneal fluid. Read by: Artemio Aly Electronically Signed   By: Marliss Coots MD   On: 11/12/2020 15:38

## 2020-11-18 ENCOUNTER — Other Ambulatory Visit (HOSPITAL_COMMUNITY): Payer: Self-pay | Admitting: Internal Medicine

## 2020-11-18 DIAGNOSIS — I272 Pulmonary hypertension, unspecified: Secondary | ICD-10-CM

## 2020-11-18 DIAGNOSIS — I5033 Acute on chronic diastolic (congestive) heart failure: Secondary | ICD-10-CM | POA: Diagnosis not present

## 2020-11-18 DIAGNOSIS — R188 Other ascites: Secondary | ICD-10-CM | POA: Diagnosis not present

## 2020-11-18 LAB — COMPREHENSIVE METABOLIC PANEL WITH GFR
ALT: 17 U/L (ref 0–44)
AST: 20 U/L (ref 15–41)
Albumin: 2.7 g/dL — ABNORMAL LOW (ref 3.5–5.0)
Alkaline Phosphatase: 75 U/L (ref 38–126)
Anion gap: 9 (ref 5–15)
BUN: 30 mg/dL — ABNORMAL HIGH (ref 8–23)
CO2: 25 mmol/L (ref 22–32)
Calcium: 8.5 mg/dL — ABNORMAL LOW (ref 8.9–10.3)
Chloride: 99 mmol/L (ref 98–111)
Creatinine, Ser: 1.21 mg/dL (ref 0.61–1.24)
GFR, Estimated: >60 mL/min
Glucose, Bld: 161 mg/dL — ABNORMAL HIGH (ref 70–99)
Potassium: 4.5 mmol/L (ref 3.5–5.1)
Sodium: 133 mmol/L — ABNORMAL LOW (ref 135–145)
Total Bilirubin: 0.9 mg/dL (ref 0.3–1.2)
Total Protein: 5.6 g/dL — ABNORMAL LOW (ref 6.5–8.1)

## 2020-11-18 LAB — GLUCOSE, CAPILLARY
Glucose-Capillary: 127 mg/dL — ABNORMAL HIGH (ref 70–99)
Glucose-Capillary: 116 mg/dL — ABNORMAL HIGH (ref 70–99)

## 2020-11-18 LAB — CBC WITH DIFFERENTIAL/PLATELET
Abs Immature Granulocytes: 0.02 10*3/uL (ref 0.00–0.07)
Basophils Absolute: 0 10*3/uL (ref 0.0–0.1)
Basophils Relative: 0 %
Eosinophils Absolute: 0.1 10*3/uL (ref 0.0–0.5)
Eosinophils Relative: 2 %
HCT: 29.5 % — ABNORMAL LOW (ref 39.0–52.0)
Hemoglobin: 10 g/dL — ABNORMAL LOW (ref 13.0–17.0)
Immature Granulocytes: 0 %
Lymphocytes Relative: 13 %
Lymphs Abs: 0.8 10*3/uL (ref 0.7–4.0)
MCH: 31.3 pg (ref 26.0–34.0)
MCHC: 33.9 g/dL (ref 30.0–36.0)
MCV: 92.2 fL (ref 80.0–100.0)
Monocytes Absolute: 0.4 10*3/uL (ref 0.1–1.0)
Monocytes Relative: 7 %
Neutro Abs: 4.7 10*3/uL (ref 1.7–7.7)
Neutrophils Relative %: 78 %
Platelets: 242 10*3/uL (ref 150–400)
RBC: 3.2 MIL/uL — ABNORMAL LOW (ref 4.22–5.81)
RDW: 14 % (ref 11.5–15.5)
WBC: 6 10*3/uL (ref 4.0–10.5)
nRBC: 0 % (ref 0.0–0.2)

## 2020-11-18 LAB — BRAIN NATRIURETIC PEPTIDE: B Natriuretic Peptide: 221.3 pg/mL — ABNORMAL HIGH (ref 0.0–100.0)

## 2020-11-18 MED ORDER — AMLODIPINE BESYLATE 10 MG PO TABS: 10.0000 mg | ORAL_TABLET | Freq: Every day | ORAL | 0 refills | Status: DC

## 2020-11-18 MED ORDER — SPIRONOLACTONE 25 MG PO TABS: 25.0000 mg | ORAL_TABLET | Freq: Every day | ORAL | 0 refills | Status: DC

## 2020-11-18 MED FILL — LISINOPRIL 2.5 MG TABLET: 2.5 | 30 days supply | Qty: 30 | Fill #0

## 2020-11-18 MED FILL — AMLODIPINE BESYLATE 10 MG T: 10 | 30 days supply | Qty: 30 | Fill #0

## 2020-11-18 MED FILL — METFORMIN HCL 500 MG TABS: 500 | 90 days supply | Qty: 180 | Fill #1

## 2020-11-18 MED FILL — METOPROLOL TARTRATE 50 MG T: 50 | 90 days supply | Qty: 180 | Fill #0

## 2020-11-18 MED FILL — SPIRONOLACTONE 25 MG TABLET: 25 | 30 days supply | Qty: 30 | Fill #0

## 2020-11-18 MED FILL — FARXIGA 5 MG TABLET: 5 | 30 days supply | Qty: 30 | Fill #0

## 2020-11-18 MED FILL — PANTOPRAZOLE SOD DR 40 MG T: 40 | 30 days supply | Qty: 30 | Fill #0

## 2020-11-18 MED FILL — ATORVASTATIN CALCIUM 20 MG: 20 | 30 days supply | Qty: 30 | Fill #0

## 2020-11-18 NOTE — Plan of Care (Signed)
  Problem: Education: Goal: Knowledge of General Education information will improve Description: Including pain rating scale, medication(s)/side effects and non-pharmacologic comfort measures Outcome: Adequate for Discharge   Problem: Health Behavior/Discharge Planning: Goal: Ability to manage health-related needs will improve Outcome: Adequate for Discharge   Problem: Clinical Measurements: Goal: Ability to maintain clinical measurements within normal limits will improve Outcome: Adequate for Discharge Goal: Will remain free from infection Outcome: Adequate for Discharge Goal: Diagnostic test results will improve Outcome: Adequate for Discharge Goal: Respiratory complications will improve Outcome: Adequate for Discharge Goal: Cardiovascular complication will be avoided Outcome: Adequate for Discharge   Problem: Activity: Goal: Risk for activity intolerance will decrease Outcome: Adequate for Discharge   Problem: Nutrition: Goal: Adequate nutrition will be maintained Outcome: Adequate for Discharge   Problem: Coping: Goal: Level of anxiety will decrease Outcome: Adequate for Discharge   Problem: Elimination: Goal: Will not experience complications related to bowel motility Outcome: Adequate for Discharge Goal: Will not experience complications related to urinary retention Outcome: Adequate for Discharge   Problem: Pain Managment: Goal: General experience of comfort will improve Outcome: Adequate for Discharge   Problem: Safety: Goal: Ability to remain free from injury will improve Outcome: Adequate for Discharge   Problem: Skin Integrity: Goal: Risk for impaired skin integrity will decrease Outcome: Adequate for Discharge   Problem: Education: Goal: Knowledge of risk factors and measures for prevention of condition will improve Outcome: Adequate for Discharge   Problem: Coping: Goal: Psychosocial and spiritual needs will be supported Outcome: Adequate for  Discharge   Problem: Respiratory: Goal: Will maintain a patent airway Outcome: Adequate for Discharge Goal: Complications related to the disease process, condition or treatment will be avoided or minimized Outcome: Adequate for Discharge   Problem: Education: Goal: Ability to demonstrate management of disease process will improve Outcome: Adequate for Discharge Goal: Ability to verbalize understanding of medication therapies will improve Outcome: Adequate for Discharge Goal: Individualized Educational Video(s) Outcome: Adequate for Discharge   Problem: Activity: Goal: Capacity to carry out activities will improve Outcome: Adequate for Discharge   Problem: Cardiac: Goal: Ability to achieve and maintain adequate cardiopulmonary perfusion will improve Outcome: Adequate for Discharge   

## 2020-11-18 NOTE — Progress Notes (Signed)
Patient discharged from unit at this time. All discharge instructions reviewed with patient at bedside. All questions answered and follow up appointments verbalized by patient. IV site removed, catheter intact, pressure dressing applied. All patient belongings in disposition of the patient. No further needs at this time.

## 2020-11-18 NOTE — Discharge Summary (Signed)
ADORIAN GWYNNE, is a 65 y.o. male  DOB 1955-12-16  MRN [id number].  Admission date:  11/10/2020  Admitting Physician  Thurnell Lose, MD  Discharge Date:  11/18/2020   Primary MD  Charlott Rakes, MD  Recommendations for primary care physician for things to follow:  - please check CBC, CMP during next visit. - patient to follow with cardiology as an outpatient, appointment has been scheduled with CHF clinic.   Admission Diagnosis  Shortness of breath [R06.02] Cirrhosis (HCC) [K74.60] Other ascites [R18.8] Ascites [R18.8] Acute on chronic diastolic (congestive) heart failure (HCC) [I50.33] Congestive heart failure, unspecified HF chronicity, unspecified heart failure type ([place]) [I50.9] COVID-19 [U07.1]   Discharge Diagnosis  Shortness of breath [R06.02] Cirrhosis ([place]) [K74.60] Other ascites [R18.8] Ascites [R18.8] Acute on chronic diastolic (congestive) heart failure (HCC) [I50.33] Congestive heart failure, unspecified HF chronicity, unspecified heart failure type ([place]) [I50.9] COVID-19 [U07.1]    Principal Problem:   Acute on chronic diastolic CHF (congestive heart failure) (HCC) Active Problems:   Atrial fibrillation, chronic (HCC)   Essential hypertension   Type 2 diabetes mellitus with stage 3a chronic kidney disease, without long-term current use of insulin (HCC)   Class 3 severe obesity due to excess calories with serious comorbidity and body mass index (BMI) of 40.0 to 44.9 in adult [facility])   Chronic kidney disease, stage 3a (HCC)   GERD without esophagitis   COVID-19 virus infection   Acute on chronic diastolic (congestive) heart failure (HCC)   Ascites   Abnormal liver ultrasound   Right-sided heart failure ([place])      Past Medical History:  Diagnosis Date  . Atrial fibrillation ([place])   . Cellulitis and abscess of left leg 06/2016  . CHF (congestive heart failure) ([place])   .  Diabetes ([place])   . Dyspnea     Past Surgical History:  Procedure Laterality Date  . AMPUTATION Left 06/24/2016   Procedure: FOOT FIFTH RAY TOE AMPUTATION;  Surgeon: Newt Minion, MD;  Location: [place];  Service: Orthopedics;  Laterality: Left;  . IR PARACENTESIS  11/12/2020  . IR PARACENTESIS  11/13/2020  . IR PARACENTESIS  11/16/2020  . open heart surgery     As a child.  71 years old.  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   [name] RIGHT/LEFT HEART CATH AND CORONARY ANGIOGRAPHY N/A 03/30/2017   Procedure: Right/Left Heart Cath and Coronary Angiography;  Surgeon: Larey Dresser, MD;  Location: [place] CV LAB;  Service: Cardiovascular;  Laterality: N/A;       History of present illness and  Hospital Course:     Kindly see H&P for history of present illness and admission details, please review complete Labs, Consult reports and Test reports for all details in brief  HPI  from the history and physical done on the day of admission 11/10/2020  65 year old male with past medical history of diabetes mellitus type 2, hypertension, chronic atrial fibrillation (on Xarelto), pulmonary hypertension, diastolic congestive heart failure (Echo 2018 EF 55-60%), peripheral vascular disease and gastroesophageal  reflux disease who presents to [facility] emergency department with complaints of shortness of breath and cough.  Patient explains that for approximately past 6 months he has been developing gradually worsening bilateral lower extremity swelling.  Patient endorses an approximate 80 pound weight gain over the span of time.  Patient explains that approximately 10 days ago he began to develop shortness of breath.  Shortness of breath is worse with exertion and improved with rest.  Patient denies any associated chest pain.  Patient does also endorse associated cough that is nonproductive.  Patient denies any fevers, sick contacts, recent travel or confirmed contact with COVID-19 infection.  Patient symptoms of  progressively worsening peripheral edema shortness of breath and cough continued to persist until the patient eventually presented to [facility] emergency department for evaluation.  Upon evaluation in the emergency department, patient was clinically felt to be suffering from acute congestive heart failure.  BNP was found to be elevated at 693.  Troponin was found to be slightly elevated at 21.  Chest x-ray revealed evidence of cardiomegaly and vascular congestion.  Patient was found to be Covid positive.  Patient was administered 80 mg of IV Lasix.  The hospitalist group was then called to assess the patient for admission to the hospital.  Hospital Course   1. Acute Hypoxic Resp. Failure due to Acute Covid 19 Viral Pneumonitis along with possible acute on chronic diastolic CHF/right heart failure: - he presents with  massive ascites causing diaphragmatic compression- he is vaccinated and did have a  mild COVID-19pneumonia, he was treated with steroids and Remdesivir. -Has been diuresed with Lasix, I think most of his shortness of breath was due to massive undiagnosed ascites, with treatment as in #2 below he is much better. - 2 D echo with Right ventricular systolic function is moderately reduced. The right  ventricular size is severely enlarged. And  severely elevated  pulmonary artery systolic pressure.  estimated right ventricular  systolic pressure is 02.5 mmHg. D-shaped  interventricular septum suggestive of RV pressure/volume overload, patient appears to be euvolemic at time of discharge, he is following with CHF clinic, appointment been scheduled upon discharge , he is instructed to follow-up.  SpO2: 94 % O2 Flow Rate (L/min): 2 L/min  Last Labs             Recent Labs  Lab 11/10/20 1946 11/10/20 2241 11/11/20 0404 11/12/20 0134 11/13/20 0124 11/14/20 0409 11/15/20 0411 11/16/20 0129  WBC  --   --    < > 4.3 5.3 5.3 6.8 6.2  HGB  --   --    < > 9.1* 8.7* 8.8* 9.4* 9.7*   HCT  --   --    < > 29.1* 27.0* 26.3* 27.5* 29.8*  PLT  --   --    < > 234 198 196 201 216  CRP  --   --    < > 1.2* 0.8 0.7 0.7 0.7  BNP 693.0*  --   --  395.2* 442.0* 439.8* 417.0*  --   DDIMER  --   --    < > 8.64* 7.88* 6.23* 7.36* 9.00*  AST 20  --    < > 21 13* 13* 17 16  ALT 11  --    < > _0 ALKPHOS 113  --    < > 102 83 79 87 96  BILITOT 1.7*  --    < > 1.3* 1.0 0.9  0.8 0.8  ALBUMIN 3.3*  --    < > 3.0* 2.6* 2.6* 2.8* 2.8*  SARSCOV2NAA  --  POSITIVE*  --   --   --   --   --   --    < > = values in this interval not displayed.      2. New diagnosis of ascites with history of severe pulmonary venous hypertension diagnosed in 2018 - he has  undergone 3 ultrasound-guided paracentesis this admission with a total of 20 L of fluid removed, SAAG score less than 1 suggesting nonportal hypertension etiology, acute hepatitis panel negative, seen by GI. -He will be discharged on home dose torsemide and started on low-dose Aldactone.  Leg ultrasound shows no DVTs, VQ scan negative for chronic PEs.  Already on full anticoagulation but D-dimer always stays high.  he has severe pulmonary hypertension which is venous in etiology, for now his treatment is diuresis as tolerated with outpatient cardiology follow-up.  3. Paroxysmal A. fib. [place] vas 2 score of greater than 3.  Continue Xarelto, has A. fib with slow ventricular response hence no beta-blocker.  Stable TSH.  4. CAD s/p CABG. Currently chest pain-free no acute issues, has history of VSD. Currently stable no acute issues, continue statin, Xarelto echo shows preserved EF, avoiding beta-blocker due to bradycardia, blood pressure medications has been adjusted.  5. Dyslipidemia on statin.  6. GERD. PPI.  7.  AKI.  He had massive fluid overload due to ascites, 20 L of fluid removed during hospital stay, renal function stable at discharge.  8. Hypretension.  -Blood pressure is acceptable Norvasc 10 mg oral  daily. -Metoprolol has been discontinued given bradycardia. -Hydralazine and lisinopril has been restarted during hospital stay, and has not been resumed on discharge given stable blood pressure only on Norvasc.  9. DM2 - continue [place].     Discharge Condition:  stable   Follow UP   Follow-up Information    [facility] Follow up on 12/10/2020.   Specialty: Cardiology Why: 2:30 PM [facility], Entrance C Parking Garage Code 1223 Contact information: [address] [phone] [place]       Charlott Rakes, MD. Schedule an appointment as soon as possible for a visit in 10 day(s).   Specialty: Family Medicine Why: schedule an appointment within 10 days Contact information: [place] [address] [phone]                 Discharge Instructions  and  Discharge Medications    Discharge Instructions    Discharge instructions   Complete by: As directed    Follow with Primary MD Charlott Rakes, MD in 10 days   Get CBC, CMP, checked  by Primary MD next visit.    Activity: As tolerated with Full fall precautions use walker/cane & assistance as needed   Disposition Home    Diet: Heart Healthy / carb modified .  For Heart failure patients - Check your Weight same time everyday, if you gain over 2 pounds, or you develop in leg swelling, experience more shortness of breath or chest pain, call your Primary MD immediately. Follow Cardiac Low Salt Diet and 1.5 lit/day fluid restriction.   On your next visit with your primary care physician please Get Medicines reviewed and adjusted.   Please request your Prim.MD to go over all Hospital Tests and Procedure/Radiological results at the  follow up, please get all Hospital records sent to your Prim MD by signing hospital release before you go home.   If you  experience worsening of your admission symptoms, develop shortness of breath, life threatening emergency, suicidal or homicidal thoughts you must seek medical attention immediately by calling 911 or calling your MD immediately  if symptoms less severe.  You Must read complete instructions/literature along with all the possible adverse reactions/side effects for all the Medicines you take and that have been prescribed to you. Take any new Medicines after you have completely understood and accpet all the possible adverse reactions/side effects.   Do not drive, operating heavy machinery, perform activities at heights, swimming or participation in water activities or provide baby sitting services if your were admitted for syncope or siezures until you have seen by Primary MD or a Neurologist and advised to do so again.  Do not drive when taking Pain medications.    Do not take more than prescribed Pain, Sleep and Anxiety Medications  Special Instructions: If you have smoked or chewed Tobacco  in the last 2 yrs please stop smoking, stop any regular Alcohol  and or any Recreational drug use.  Wear Seat belts while driving.   Please note  You were cared for by a hospitalist during your hospital stay. If you have any questions about your discharge medications or the care you received while you were in the hospital after you are discharged, you can call the unit and asked to speak with the hospitalist on call if the hospitalist that took care of you is not available. Once you are discharged, your primary care physician will handle any further medical issues. Please note that NO REFILLS for any discharge medications will be authorized once you are discharged, as it is imperative that you return to your primary care physician (or establish a relationship with a primary care physician if you do not have one) for your aftercare needs so that they can reassess your need for medications and monitor your lab  values.   Increase activity slowly   Complete by: As directed      Allergies as of 11/18/2020   No Known Allergies     Medication List    STOP taking these medications   hydrALAZINE 100 MG tablet Commonly known as: APRESOLINE   lisinopril 2.5 MG tablet Commonly known as: ZESTRIL   metoprolol tartrate 50 MG tablet Commonly known as: LOPRESSOR     TAKE these medications   Accu-Chek Guide Me w/Device Kit Use to check blood sugar daily.   Accu-Chek Guide test strip Generic drug: glucose blood Use to check blood sugar daily.   Accu-Chek Softclix Lancets lancets Use to check blood sugar daily.   albuterol 108 (90 Base) MCG/ACT inhaler Commonly known as: VENTOLIN HFA Inhale 2 puffs into the lungs every 6 (six) hours as needed for wheezing or shortness of breath.   amLODipine 10 MG tablet Commonly known as: NORVASC Take 1 tablet (10 mg total) by mouth daily. Start taking on: November 19, 2020 What changed:   medication strength  how much to take   atorvastatin 20 MG tablet Commonly known as: LIPITOR Take 1 tablet (20 mg total) by mouth daily.   cetirizine 10 MG tablet Commonly known as: ZYRTEC Take 1 tablet (10 mg total) by mouth daily.   dapagliflozin propanediol 5 MG Tabs tablet Commonly known as: Farxiga Take 1 tablet (5 mg total) by mouth daily before breakfast.   Misc. Devices  Misc Blood pressure monitor.  Diagnosis- hypertension   pantoprazole 40 MG tablet Commonly known as: PROTONIX Take 1 tablet (40 mg total) by mouth daily.   rivaroxaban 20 MG Tabs tablet Commonly known as: Xarelto TAKE 1 TABLET BY MOUTH DAILY WITH SUPPER. What changed:   how much to take  how to take this  when to take this  additional instructions   spironolactone 25 MG tablet Commonly known as: ALDACTONE Take 1 tablet (25 mg total) by mouth daily. Start taking on: November 19, 2020   torsemide 20 MG tablet Commonly known as: DEMADEX Take 2 tablets (40 mg total) by  mouth 2 (two) times daily.         Diet and Activity recommendation: See Discharge Instructions above   Consults obtained -  GI   Major procedures and Radiology Reports - PLEASE review detailed and final reports for all details, in brief -     DG Chest 2 View  Result Date: 11/10/2020 CLINICAL DATA:  Short of breath EXAM: CHEST - 2 VIEW COMPARISON:  03/30/2017 FINDINGS: Mild cardiomegaly with central vascular congestion. No overt edema or pleural effusion. No consolidation or pneumothorax. Old fracture deformity of the left clavicle. IMPRESSION: Cardiomegaly with mild central vascular congestion. Electronically Signed   By: Donavan Foil M.D.   On: 11/10/2020 17:48   NM Pulmonary Perfusion  Result Date: 11/15/2020 CLINICAL DATA:  Shortness of breath EXAM: NUCLEAR MEDICINE PERFUSION LUNG SCAN TECHNIQUE: Perfusion images were obtained in multiple projections after intravenous injection of radiopharmaceutical. Ventilation scans intentionally deferred if perfusion scan and chest x-ray adequate for interpretation during COVID 19 epidemic. RADIOPHARMACEUTICALS:  4.3 mCi Tc-12mMAA IV COMPARISON:  November 15, 2020 radiograph FINDINGS: There is cardiomegaly. There is corresponding decreased perfusion following the contours of the cardiomegaly. Otherwise homogeneous distribution of radiotracer bilaterally. No focal wedge-shaped defect. IMPRESSION: PE absent.  Cardiomegaly. Electronically Signed   By: SValentino SaxonMD   On: 11/15/2020 17:27   DG Chest Port 1 View  Result Date: 11/17/2020 CLINICAL DATA:  Shortness of breath EXAM: PORTABLE CHEST 1 VIEW COMPARISON:  November 15, 2020 at November 14, 2020 FINDINGS: There has been interval clearing of opacity in the left base. There is an equivocal left pleural effusion. There is no appreciable edema or airspace opacity elsewhere. There is slight atelectasis in the left mid lung. Heart is mildly enlarged with pulmonary vascularity normal. No adenopathy. There is  an old healed fracture of the left clavicle. IMPRESSION: Interval clearing of opacity from the left base. Equivocal left pleural effusion currently present with mild left midlung atelectasis. Lungs elsewhere clear. Stable cardiac prominence. Electronically Signed   By: WLowella GripIII M.D.   On: 11/17/2020 08:12   DG Chest Port 1 View  Result Date: 11/15/2020 CLINICAL DATA:  COVID-19 positive. EXAM: PORTABLE CHEST 1 VIEW COMPARISON:  November 14, 2020 FINDINGS: Stable cardiomegaly. The hila and mediastinum are unchanged. No pneumothorax. No nodules or masses. Mild opacity in the periphery of the left lung base. No other abnormalities. IMPRESSION: Mild opacity remains in the periphery of the left lung base. There may be a small effusion. No interval changes. Electronically Signed   By: DDorise BullionIII M.D   On: 11/15/2020 13:02   DG Chest Port 1 View  Result Date: 11/14/2020 CLINICAL DATA:  Shortness of breath.  History of COVID-19 pneumonia. EXAM: PORTABLE CHEST 1 VIEW COMPARISON:  November 12, 2020 FINDINGS: No pneumothorax. The cardiomediastinal silhouette is stable. No pulmonary nodules or  masses. No focal infiltrates. No acute abnormalities. IMPRESSION: No active disease. Electronically Signed   By: Dorise Bullion III M.D   On: 11/14/2020 09:07   DG Chest Port 1 View  Result Date: 11/12/2020 CLINICAL DATA:  Shortness of breath. EXAM: PORTABLE CHEST 1 VIEW COMPARISON:  November 10, 2020. FINDINGS: Stable cardiomegaly. No pneumothorax or pleural effusion is noted. Lungs are clear. Bony thorax is unremarkable. IMPRESSION: No active disease. Electronically Signed   By: Marijo Conception M.D.   On: 11/12/2020 09:10   ECHOCARDIOGRAM COMPLETE  Result Date: 11/11/2020    ECHOCARDIOGRAM REPORT   Patient Name:   KASSIM GUERTIN Date of Exam: 11/11/2020 Medical Rec #:  [id number]       Height:       73.0 in Accession #:    [id number]      Weight:       306.0 lb Date of Birth:  1955-10-29       BSA:          2.578 m  Patient Age:    27 years        BP:           134/74 mmHg Patient Gender: M               HR:           47 bpm. Exam Location:  Inpatient Procedure: 2D Echo, Cardiac Doppler and Color Doppler Indications:    I50.33 Acute on chronic diastolic (congestive) heart failure  History:        Patient has prior history of Echocardiogram examinations, most                 recent 11/28/2016. CHF, Arrythmias:Atrial Fibrillation,                 Signs/Symptoms:Dyspnea; Risk Factors:Diabetes. COVID-19                 Positive.  Sonographer:    Jonelle Sidle Dance Referring Phys: [id number] [place]  1. Left ventricular ejection fraction, by estimation, is 55%. The left ventricle has normal function. The left ventricle has no regional wall motion abnormalities. Left ventricular diastolic parameters are indeterminate.  2. Right ventricular systolic function is moderately reduced. The right ventricular size is severely enlarged. There is severely elevated pulmonary artery systolic pressure. The estimated right ventricular systolic pressure is 02.5 mmHg. D-shaped interventricular septum suggestive of RV pressure/volume overload.  3. Left atrial size was moderately dilated.  4. Right atrial size was moderately dilated.  5. The mitral valve is normal in structure. Trivial mitral valve regurgitation. No evidence of mitral stenosis.  6. Tricuspid valve regurgitation is mild to moderate.  7. The aortic valve is tricuspid. Aortic valve regurgitation is not visualized. Mild aortic valve sclerosis is present, with no evidence of aortic valve stenosis.  8. The inferior vena cava is dilated in size with <50% respiratory variability, suggesting right atrial pressure of 15 mmHg.  9. Prominent ascites noted. 10. The patient was in atrial fibrillation. FINDINGS  Left Ventricle: Left ventricular ejection fraction, by estimation, is 55%. The left ventricle has normal function. The left ventricle has no regional wall motion abnormalities.  The left ventricular internal cavity size was normal in size. There is no left ventricular hypertrophy. Left ventricular diastolic parameters are indeterminate. Right Ventricle: The right ventricular size is severely enlarged. No increase in right ventricular wall thickness. Right ventricular systolic function is moderately reduced. There is severely elevated  pulmonary artery systolic pressure. The tricuspid regurgitant velocity is 3.83 m/s, and with an assumed right atrial pressure of 15 mmHg, the estimated right ventricular systolic pressure is 94.1 mmHg. Left Atrium: Left atrial size was moderately dilated. Right Atrium: Right atrial size was moderately dilated. Pericardium: There is no evidence of pericardial effusion. Mitral Valve: The mitral valve is normal in structure. Mild mitral annular calcification. Trivial mitral valve regurgitation. No evidence of mitral valve stenosis. Tricuspid Valve: The tricuspid valve is normal in structure. Tricuspid valve regurgitation is mild to moderate. Aortic Valve: The aortic valve is tricuspid. Aortic valve regurgitation is not visualized. Mild aortic valve sclerosis is present, with no evidence of aortic valve stenosis. Pulmonic Valve: The pulmonic valve was normal in structure. Pulmonic valve regurgitation is trivial. Aorta: The aortic root is normal in size and structure. Venous: The inferior vena cava is dilated in size with less than 50% respiratory variability, suggesting right atrial pressure of 15 mmHg. IAS/Shunts: No atrial level shunt detected by color flow Doppler.  LEFT VENTRICLE PLAX 2D LVIDd:         5.00 cm LVIDs:         2.70 cm LV PW:         1.50 cm LV IVS:        0.90 cm LVOT diam:     2.00 cm LV SV:         86 LV SV Index:   33 LVOT Area:     3.14 cm  RIGHT VENTRICLE          IVC RV Basal diam:  3.90 cm  IVC diam: 3.30 cm RV Mid diam:    2.60 cm TAPSE (M-mode): 1.8 cm LEFT ATRIUM              Index       RIGHT ATRIUM           Index LA diam:         5.40 cm  2.09 cm/m  RA Area:     27.40 cm LA Vol (A2C):   119.0 ml 46.16 ml/m RA Volume:   92.90 ml  36.04 ml/m LA Vol (A4C):   102.0 ml 39.57 ml/m LA Biplane Vol: 119.0 ml 46.16 ml/m  AORTIC VALVE LVOT Vmax:   119.50 cm/s LVOT Vmean:  78.700 cm/s LVOT VTI:    0.272 m  AORTA Ao Root diam: 3.50 cm Ao Asc diam:  3.20 cm MITRAL VALVE                TRICUSPID VALVE MV Area (PHT): 3.00 cm     TR Peak grad:   58.7 mmHg MV Decel Time: 253 msec     TR Vmax:        383.00 cm/s MV E velocity: 105.00 cm/s MV A velocity: 36.10 cm/s   SHUNTS MV E/A ratio:  2.91         Systemic VTI:  0.27 m                             Systemic Diam: 2.00 cm Loralie Champagne MD Electronically signed by Loralie Champagne MD Signature Date/Time: 11/11/2020/4:21:17 PM    Final    VAS [place] LOWER EXTREMITY VENOUS (DVT)  Result Date: 11/14/2020  Lower Venous DVT Study Indications: Edema, and Covid + elevated D-dimer.  Risk Factors: CHF - post paracentesis. Comparison Study: No previous exams Performing Technologist: Rogelia Rohrer  Examination Guidelines:  A complete evaluation includes B-mode imaging, spectral Doppler, color Doppler, and power Doppler as needed of all accessible portions of each vessel. Bilateral testing is considered an integral part of a complete examination. Limited examinations for reoccurring indications may be performed as noted. The reflux portion of the exam is performed with the patient in reverse Trendelenburg.  +---------+---------------+---------+-----------+----------+--------------+ RIGHT    CompressibilityPhasicitySpontaneityPropertiesThrombus Aging +---------+---------------+---------+-----------+----------+--------------+ CFV      Full           Yes      Yes                                 +---------+---------------+---------+-----------+----------+--------------+ SFJ      Full                                                         +---------+---------------+---------+-----------+----------+--------------+ FV Prox  Full           Yes      Yes                                 +---------+---------------+---------+-----------+----------+--------------+ FV Mid   Full           Yes      Yes                                 +---------+---------------+---------+-----------+----------+--------------+ FV DistalFull           Yes      Yes                                 +---------+---------------+---------+-----------+----------+--------------+ PFV      Full                                                        +---------+---------------+---------+-----------+----------+--------------+ POP      Full           Yes      Yes                                 +---------+---------------+---------+-----------+----------+--------------+ PTV      Full                                                        +---------+---------------+---------+-----------+----------+--------------+ PERO     Full                                                        +---------+---------------+---------+-----------+----------+--------------+   +---------+---------------+---------+-----------+----------+--------------+ LEFT  CompressibilityPhasicitySpontaneityPropertiesThrombus Aging +---------+---------------+---------+-----------+----------+--------------+ CFV      Full           Yes      Yes                                 +---------+---------------+---------+-----------+----------+--------------+ SFJ      Full                                                        +---------+---------------+---------+-----------+----------+--------------+ FV Prox  Full           Yes      Yes                                 +---------+---------------+---------+-----------+----------+--------------+ FV Mid   Full           Yes      Yes                                  +---------+---------------+---------+-----------+----------+--------------+ FV DistalFull           Yes      Yes                                 +---------+---------------+---------+-----------+----------+--------------+ PFV      Full                                                        +---------+---------------+---------+-----------+----------+--------------+ POP      Full           Yes      Yes                                 +---------+---------------+---------+-----------+----------+--------------+ PTV      Full                                                        +---------+---------------+---------+-----------+----------+--------------+ PERO     Full                                                        +---------+---------------+---------+-----------+----------+--------------+     Summary: BILATERAL: - No evidence of deep vein thrombosis seen in the lower extremities, bilaterally. BLE subcutaneous edema from mid thighs to calves. - RIGHT: - A cystic structure is found in the popliteal fossa.  LEFT: - No cystic structure found in the popliteal fossa.  *See table(s) above for measurements and observations. Electronically signed by Jamelle Haring on 11/14/2020 at 5:46:17 PM.  Final    US Abdomen Limited RUQ (LIVER/GB)  Result Date: 11/11/2020 CLINICAL DATA:  Cirrhosis.  Ascites.  Abdominal distention. EXAM: ULTRASOUND ABDOMEN LIMITED RIGHT UPPER QUADRANT COMPARISON:  No prior. FINDINGS: Gallbladder: No gallstones or wall thickening visualized. No sonographic Murphy sign noted by sonographer. Common bile duct: Diameter: 5.4 mm Liver: Increased echogenicity and nodular irregular cortex consistent with cirrhosis. No focal hepatic abnormality identified. Portal vein is patent on color Doppler imaging with normal direction of blood flow towards the liver. Other: Prominent ascites. IMPRESSION: 1. No gallstones or biliary distention. 2. Increased hepatic echogenicity and  nodular hepatic cortex consistent with cirrhosis. No focal hepatic abnormality identified. 3. Prominent ascites. Electronically Signed   By: Marcello Moores  Register   On: 11/11/2020 06:58   IR Paracentesis  Result Date: 11/16/2020 INDICATION: History of CAD with prior CABG, cirrhosis, and ascites. Request was made for therapeutic and diagnostic paracentesis EXAM: ULTRASOUND GUIDED PARACENTESIS MEDICATIONS: 1% lidocaine 10 mL COMPLICATIONS: None immediate. PROCEDURE: Informed written consent was obtained from the patient after a discussion of the risks, benefits and alternatives to treatment. A timeout was performed prior to the initiation of the procedure. Initial ultrasound scanning demonstrates a large amount of ascites within the left lower abdominal quadrant. The left lower abdomen was prepped and draped in the usual sterile fashion. 1% lidocaine was used for local anesthesia. Following this, a 19 gauge, 7-cm, Yueh catheter was introduced. An ultrasound image was saved for documentation purposes. The paracentesis was performed. The catheter was removed and a dressing was applied. The patient tolerated the procedure well without immediate post procedural complication. Patient received post-procedure intravenous albumin; see nursing notes for details. FINDINGS: A total of approximately 8.5 L of amber-colored fluid was removed. Samples were sent to the laboratory as requested by the clinical team. IMPRESSION: Successful ultrasound-guided paracentesis yielding 8.5 liters of peritoneal fluid. Read by: Soyla Dryer, NP Electronically Signed   By: Jerilynn Mages.  Shick M.D.   On: 11/16/2020 14:02   IR Paracentesis  Result Date: 11/13/2020 INDICATION: History of CAD with prior CABG, cirrhosis, and ascites. Request was made for therapeutic paracentesis up to 6 L. EXAM: ULTRASOUND GUIDED THERAPEUTIC PARACENTESIS MEDICATIONS: 12 mL 1% lidocaine COMPLICATIONS: None immediate. PROCEDURE: Informed written consent was obtained from the  patient after a discussion of the risks, benefits and alternatives to treatment. A timeout was performed prior to the initiation of the procedure. Initial ultrasound scanning demonstrates a large amount of ascites within the right lower abdominal quadrant. The right lower abdomen was prepped and draped in the usual sterile fashion. 1% lidocaine was used for local anesthesia. Following this, a 19 gauge, 15-cm, Yueh catheter was introduced. An ultrasound image was saved for documentation purposes. The paracentesis was performed. The catheter was removed and a dressing was applied. The patient tolerated the procedure well without immediate post procedural complication. Patient received post-procedure intravenous albumin; see nursing notes for details. FINDINGS: A total of approximately 6 L of clear yellow fluid was removed. IMPRESSION: Successful ultrasound-guided paracentesis yielding 6 liters of peritoneal fluid. Read by: Durenda Guthrie, PA-C Electronically Signed   By: Lucrezia Europe M.D.   On: 11/13/2020 16:18   IR Paracentesis  Result Date: 11/12/2020 INDICATION: Patient with history of COVID 19 pneumonia, coronary artery disease with prior CABG, cirrhosis by imaging, ascites; request received for diagnostic and therapeutic paracentesis up to 5 liters. EXAM: ULTRASOUND GUIDED DIAGNOSTIC AND THERAPEUTIC  PARACENTESIS MEDICATIONS: 1% lidocaine to skin and SQ tissue COMPLICATIONS: None immediate. PROCEDURE: Informed written consent  was obtained from the patient after a discussion of the risks, benefits and alternatives to treatment. A timeout was performed prior to the initiation of the procedure. Initial ultrasound scanning demonstrates a large amount of ascites within the left lower abdominal quadrant. The left lower abdomen was prepped and draped in the usual sterile fashion. 1% lidocaine was used for local anesthesia. Following this, a 19 gauge, 10-cm, Yueh catheter was introduced. An ultrasound image was saved for  documentation purposes. The paracentesis was performed. The catheter was removed and a dressing was applied. The patient tolerated the procedure well without immediate post procedural complication. Patient received post-procedure intravenous albumin; see nursing notes for details. FINDINGS: A total of approximately 5 liters of yellow fluid was removed. Samples were sent to the laboratory as requested by the clinical team. IMPRESSION: Successful ultrasound-guided diagnostic and therapeutic paracentesis yielding 5 liters of peritoneal fluid. Read by: Lindaann Pascal Electronically Signed   By: Ruthann Cancer MD   On: 11/12/2020 15:38    Micro Results    Recent Results (from the past 240 hour(s))  Resp Panel by RT-PCR (Flu A&B, Covid) Nasopharyngeal Swab     Status: Abnormal   Collection Time: 11/10/20 10:41 PM   Specimen: Nasopharyngeal Swab; Nasopharyngeal(NP) swabs in vial transport medium  Result Value Ref Range Status   SARS Coronavirus 2 by RT PCR POSITIVE (A) NEGATIVE Final    Comment: RESULT CALLED TO, READ BACK BY AND VERIFIED WITH: E SULLIVAN RN [id number] 11/11/20 A BROWNING (NOTE) SARS-CoV-2 target nucleic acids are DETECTED.  The SARS-CoV-2 RNA is generally detectable in upper respiratory specimens during the acute phase of infection. Positive results are indicative of the presence of the identified virus, but do not rule out bacterial infection or co-infection with other pathogens not detected by the test. Clinical correlation with patient history and other diagnostic information is necessary to determine patient infection status. The expected result is Negative.  Fact Sheet for Patients: EntrepreneurPulse.com.au  Fact Sheet for Healthcare Providers: IncredibleEmployment.be  This test is not yet approved or cleared by the [place] FDA and  has been authorized for detection and/or diagnosis of SARS-CoV-2 by FDA under an Emergency Use  Authorization (EUA).  This EUA will remain in effect (meaning this test can  be used) for the duration of  the COVID-19 declaration under Section 564(b)(1) of the Act, 21 U.S.C. section 360bbb-3(b)(1), unless the authorization is terminated or revoked sooner.     Influenza A by PCR NEGATIVE NEGATIVE Final   Influenza B by PCR NEGATIVE NEGATIVE Final    Comment: (NOTE) The Xpert Xpress SARS-CoV-2/FLU/RSV plus assay is intended as an aid in the diagnosis of influenza from Nasopharyngeal swab specimens and should not be used as a sole basis for treatment. Nasal washings and aspirates are unacceptable for Xpert Xpress SARS-CoV-2/FLU/RSV testing.  Fact Sheet for Patients: EntrepreneurPulse.com.au  Fact Sheet for Healthcare Providers: IncredibleEmployment.be  This test is not yet approved or cleared by the [place] FDA and has been authorized for detection and/or diagnosis of SARS-CoV-2 by FDA under an Emergency Use Authorization (EUA). This EUA will remain in effect (meaning this test can be used) for the duration of the COVID-19 declaration under Section 564(b)(1) of the Act, 21 U.S.C. section 360bbb-3(b)(1), unless the authorization is terminated or revoked.  Performed at [facility], [place] [address], [place], [place] [address]   Culture, body fluid w Gram Stain-bottle     Status: None   Collection Time: 11/12/20  3:51  PM   Specimen: Fluid  Result Value Ref Range Status   Specimen Description FLUID PERITONEAL ABDOMEN  Final   Special Requests NONE  Final   Culture   Final    NO GROWTH 5 DAYS Performed at [facility], [address] [place], [place] [address]    Report Status 11/17/2020 FINAL  Final  Gram stain     Status: None   Collection Time: 11/12/20  3:51 PM   Specimen: Fluid  Result Value Ref Range Status   Specimen Description FLUID PERITONEAL  Final   Special Requests ABDOMEN  Final   Gram Stain   Final     WBC PRESENT, PREDOMINANTLY MONONUCLEAR NO ORGANISMS SEEN CYTOSPIN SMEAR Performed at [facility], [place] [address] [place], [place] [address]    Report Status 11/13/2020 FINAL  Final       Today   Subjective:   Maleek Craver today has no headache,no chest or abdominal pain, he denies any focal deficits, reports dyspnea has resolved.  Objective:   Blood pressure 132/63, pulse (!) 51, temperature 97.8 F (36.6 C), temperature source Oral, resp. rate 20, height _0  (1.854 m), weight 125.9 kg, SpO2 98 %.   Intake/Output Summary (Last 24 hours) at 11/18/2020 1635 Last data filed at 11/18/2020 1234 Gross per 24 hour  Intake 720 ml  Output 6150 ml  Net -5430 ml    Exam Awake Alert, Oriented x 3, No new F.N deficits, Normal affect Symmetrical Chest wall movement, Good air movement bilaterally, CTAB RRR,No Gallops,Rubs or new Murmurs, No Parasternal Heave +ve B.Sounds, Abd mildly distended, no rebound -guarding or rigidity. No Cyanosis, Clubbing, +1 edema, No new Rash or bruise  Data Review   CBC w Diff:  Lab Results  Component Value Date   WBC 6.0 11/18/2020   HGB 10.0 (L) 11/18/2020   HGB 9.9 (L) 03/16/2017   HCT 29.5 (L) 11/18/2020   HCT 30.1 (L) 03/16/2017   PLT 242 11/18/2020   PLT 222 03/16/2017   LYMPHOPCT 13 11/18/2020   MONOPCT 7 11/18/2020   EOSPCT 2 11/18/2020   BASOPCT 0 11/18/2020    CMP:  Lab Results  Component Value Date   NA 133 (L) 11/18/2020   NA 140 05/13/2020   K 4.5 11/18/2020   CL 99 11/18/2020   CO2 25 11/18/2020   BUN 30 (H) 11/18/2020   BUN 28 (H) 05/13/2020   CREATININE 1.21 11/18/2020   CREATININE 1.23 11/29/2016   PROT 5.6 (L) 11/18/2020   PROT 8.1 05/13/2020   ALBUMIN 2.7 (L) 11/18/2020   ALBUMIN 4.9 (H) 05/13/2020   BILITOT 0.9 11/18/2020   BILITOT 1.2 05/13/2020   ALKPHOS 75 11/18/2020   AST 20 11/18/2020   ALT 17 11/18/2020  .   Total Time in preparing paper work, data evaluation and todays exam - 62  minutes  Phillips Climes M.D on 11/18/2020 at 4:35 PM  [facility]   Office  [phone]

## 2020-11-19 ENCOUNTER — Telehealth: Payer: Self-pay

## 2020-11-19 NOTE — Telephone Encounter (Signed)
error 

## 2020-11-19 NOTE — Telephone Encounter (Signed)
Transition Care Management Unsuccessful Follow-up Telephone Call  Date of discharge and from where:  11/18/2020, [facility]   Attempts:  1st Attempt  Reason for unsuccessful TCM follow-up call:  Left voice message on # [phone].  Call back requested to this CM.  The patient has an appointment scheduled with Dr Alvis Lemmings 11/24/2020

## 2020-11-20 ENCOUNTER — Telehealth: Payer: Self-pay

## 2020-11-20 NOTE — Telephone Encounter (Signed)
Transition Care Management Unsuccessful Follow-up Telephone Call  Date of discharge and from where:  11/18/2020, [facility]   Attempts:  2nd Attempt  Reason for unsuccessful TCM follow-up call:  Unable to reach pt, left voice message on # [phone].  Call back requested to this CM.  Patient has an appointment scheduled with Dr Alvis Lemmings 11/24/2020

## 2020-11-23 ENCOUNTER — Telehealth: Payer: Self-pay

## 2020-11-23 NOTE — Telephone Encounter (Signed)
Transition Care Management Unsuccessful Follow-up Telephone Call  Date of discharge and from where:  11/18/2020, [facility]  Attempts:  3rd Attempt  Reason for unsuccessful TCM follow-up call:  Left voice message on # [phone], call back requested to this CM.  The patient has an appointment with Dr Alvis Lemmings tomorrow, 11/24/2020.

## 2020-11-24 ENCOUNTER — Ambulatory Visit: Payer: Medicaid Other | Attending: Family Medicine | Admitting: Family Medicine

## 2020-11-24 ENCOUNTER — Encounter: Payer: Self-pay | Admitting: Family Medicine

## 2020-11-24 VITALS — BP 136/55 | HR 72 | Ht 73.0 in | Wt 233.0 lb

## 2020-11-24 DIAGNOSIS — R188 Other ascites: Secondary | ICD-10-CM | POA: Diagnosis not present

## 2020-11-24 DIAGNOSIS — I5043 Acute on chronic combined systolic (congestive) and diastolic (congestive) heart failure: Secondary | ICD-10-CM | POA: Diagnosis not present

## 2020-11-24 DIAGNOSIS — E118 Type 2 diabetes mellitus with unspecified complications: Secondary | ICD-10-CM

## 2020-11-24 DIAGNOSIS — I110 Hypertensive heart disease with heart failure: Secondary | ICD-10-CM

## 2020-11-24 DIAGNOSIS — I5042 Chronic combined systolic (congestive) and diastolic (congestive) heart failure: Secondary | ICD-10-CM

## 2020-11-24 DIAGNOSIS — K7460 Unspecified cirrhosis of liver: Secondary | ICD-10-CM | POA: Diagnosis not present

## 2020-11-24 DIAGNOSIS — I480 Paroxysmal atrial fibrillation: Secondary | ICD-10-CM | POA: Diagnosis not present

## 2020-11-24 NOTE — Patient Instructions (Signed)
Cirrhosis  Cirrhosis is long-term (chronic) liver injury. The liver is the body's largest internal organ, and it performs many functions. It converts food into energy, removes toxic material from the blood, makes important proteins, and absorbs necessary vitamins from food. In cirrhosis, healthy liver cells are replaced by scar tissue. This prevents blood from flowing through the liver and makes it difficult for the liver to complete its functions. What are the causes? Common causes of this condition are hepatitis C and long-term alcohol abuse. Other causes include:  Nonalcoholic fatty liver disease (NAFLD). This happens when fat is deposited in the liver by causes other than alcohol.  Hepatitis B infection.  Autoimmune hepatitis. In this condition, the body's defense system (immune system) mistakenly attacks the liver cells, causing inflammation.  Diseases that cause blockage of ducts inside the liver.  Inherited liver diseases, such as hemochromatosis. This is one of the most common inherited liver diseases. In this disease, deposits of iron collect in the liver and other organs.  Reactions to certain long-term medicines, such as amiodarone, a heart medicine.  Parasitic infections. These include schistosomiasis, which is caused by a flatworm.  Long-term contact to certain toxins. These toxins include certain organic solvents, such as toluene and chloroform. What increases the risk? You are more likely to develop this condition if:  You have certain types of viral hepatitis.  You abuse alcohol, especially if you are male.  You are overweight.  You use IV drugs and share needles.  You have unprotected sex with someone who has viral hepatitis. What are the signs or symptoms? You may not have any signs and symptoms at first. Symptoms may not develop until the damage to your liver starts to get worse. Early symptoms may include:  Weakness and tiredness (fatigue).  Changes in  sleep patterns or having trouble sleeping.  Itchiness.  Tenderness in the right-upper part of your abdomen.  Weight loss and muscle loss.  Nausea.  Loss of appetite. Later symptoms may include:  Fatigue or weakness that is getting worse.  Yellow skin and eyes (jaundice).  Buildup of fluid in the abdomen (ascites). You may notice that your clothes are tight around your waist.  Weight gain and swelling of the feet and ankles (edema).  Trouble breathing.  Easy bruising and bleeding.  Vomiting blood, or black or bloody stool.  Mental confusion. How is this diagnosed? Your health care provider may suspect cirrhosis based on your symptoms and medical history, especially if you have other medical conditions or a history of alcohol abuse. Your health care provider will do a physical exam to feel your liver and to check for signs of cirrhosis. Tests may include:  Blood tests to check: ? For hepatitis B or C. ? Kidney function. ? Liver function.  Imaging tests such as: ? MRI or CT scan to look for changes seen in advanced cirrhosis. ? Ultrasound to see if normal liver tissue is being replaced by scar tissue.  A procedure in which a long needle is used to take a sample of liver tissue to be checked in a lab (biopsy). Liver biopsy can confirm the diagnosis of cirrhosis. How is this treated? Treatment for this condition depends on how damaged your liver is and what caused the damage. It may include treating the symptoms of cirrhosis, or treating the underlying causes to slow the damage. Treatment may include:  Making lifestyle changes, such as: ? Eating a healthy diet. You may need to work with your health care   provider or a dietitian to develop an eating plan. ? Restricting salt intake. ? Maintaining a healthy weight. ? Not abusing drugs or alcohol.  Taking medicines to: ? Treat liver infections or other infections. ? Control itching. ? Reduce fluid buildup. ? Reduce certain  blood toxins. ? Reduce risk of bleeding from enlarged blood vessels in the stomach or esophagus (varices).  Liver transplant. In this procedure, a liver from a donor is used to replace your diseased liver. This is done if cirrhosis has caused liver failure. Other treatments and procedures may be done depending on the problems that you get from cirrhosis. Common problems include liver-related kidney failure (hepatorenal syndrome). Follow these instructions at home:  Take medicines only as told by your health care provider. Do not use medicines that are toxic to your liver. Ask your health care provider before taking any new medicines, including over-the-counter medicines such as NSAIDs.  Rest as needed.  Eat a well-balanced diet.  Limit your salt or water intake, if your health care provider asks you to do this.  Do not drink alcohol. This is especially important if you routinely take acetaminophen.  Keep all follow-up visits. This is important.   Contact a health care provider if you:  Have fatigue or weakness that is getting worse.  Develop swelling of the hands, feet, or legs, or a buildup of fluid in the abdomen (ascites).  Have a fever or chills.  Develop loss of appetite.  Have nausea or vomiting.  Develop jaundice.  Develop easy bruising or bleeding. Get help right away if you:  Vomit bright red blood or a material that looks like coffee grounds.  Have blood in your stools.  Notice that your stools appear black and tarry.  Become confused.  Have chest pain or trouble breathing. These symptoms may represent a serious problem that is an emergency. Do not wait to see if the symptoms will go away. Get medical help right away. Call your local emergency services (911 in the [place] Do not drive yourself to the hospital. Summary  Cirrhosis is chronic liver injury. Common causes are hepatitis C and long-term alcohol abuse.  Tests used to diagnose cirrhosis include blood  tests, imaging tests, and liver biopsy.  Treatment for this condition involves treating the underlying cause. Avoid alcohol, drugs, salt, and medicines that may damage your liver.  Get help right away if you vomit bright red blood or a material that looks like coffee grounds. This information is not intended to replace advice given to you by your health care provider. Make sure you discuss any questions you have with your health care provider. Document Revised: 06/11/2020 Document Reviewed: 06/11/2020 Elsevier Patient Education  2021 [employer]  

## 2020-11-24 NOTE — Progress Notes (Signed)
Subjective:  Patient ID: Paul Mclaughlin, male    DOB: 01-12-1956  Age: 65 y.o. MRN: [id number]  CC: Hospitalization Follow-up   HPI Paul Mclaughlin is a 65 year old male with a history of type 2 diabetes mellitus (A1c 5.3), hypertension, atrial fibrillation (on anticoagulation with Xarelto and rate control with metoprolol), history of Congenital heart disease status post surgery, Pulmonary hypertension, congestive heart failure ( EF of 55% from 2-D echo 11/2020), peripheral arterial disease here for transitional care clinic visit after hospitalization at [facility] from 11/10/2020 through 11/18/2020 for CHF exacerbation, liver cirrhosis and acute respiratory failure secondary to COVID-19 pneumonitis.  I had referred him 2 weeks ago from the clinic to the ED due to weight gain of about 56 pounds over 6 months.  BP was elevated at 693 on presentation, troponin was 21.  Chest x-ray revealed pulmonary vascular congestion and cardiomegaly. He underwent IV diuresis.  Abdominal ultrasound was in keeping with cirrhosis and prominent ascites. During his hospital course he underwent abdominal paracentesis with removal of 20 L of ascitic fluid, lab suggestive of nonportal hypertension. Metoprolol was discontinued due to bradycardia, hydralazine and lisinopril restarted. Echocardiogram revealed: IMPRESSIONS    1. Left ventricular ejection fraction, by estimation, is 55%. The left  ventricle has normal function. The left ventricle has no regional wall  motion abnormalities. Left ventricular diastolic parameters are  indeterminate.  2. Right ventricular systolic function is moderately reduced. The right  ventricular size is severely enlarged. There is severely elevated  pulmonary artery systolic pressure. The estimated right ventricular  systolic pressure is 10.6 mmHg. D-shaped  interventricular septum suggestive of RV pressure/volume overload.  3. Left atrial size was moderately dilated.  4.  Right atrial size was moderately dilated.  5. The mitral valve is normal in structure. Trivial mitral valve  regurgitation. No evidence of mitral stenosis.  6. Tricuspid valve regurgitation is mild to moderate.  7. The aortic valve is tricuspid. Aortic valve regurgitation is not  visualized. Mild aortic valve sclerosis is present, with no evidence of  aortic valve stenosis.  8. The inferior vena cava is dilated in size with <50% respiratory  variability, suggesting right atrial pressure of 15 mmHg.  9. Prominent ascites noted.  10. The patient was in atrial fibrillation.    Today he reports feeling better than he has felt in a while.  Denies presence of dyspnea, paroxysmal nocturnal dyspnea, pedal edema, weight gain, abdominal swelling or pain.  Blood sugars are running around 170 prior to breakfast since he got discharged.  At his visit with me 2 weeks ago I had initiated [place] however I am not sure where he is taking this but he informs me he picked up all his medications at his last office visit. With regards to his liver cirrhosis, states he has not had a beer in 10 years. He has a visit at the cardiology clinic on 12/10/2020 He has no additional concerns today.  Past Medical History:  Diagnosis Date  . Atrial fibrillation ([place])   . Cellulitis and abscess of left leg 06/2016  . CHF (congestive heart failure) ([place])   . Diabetes ([place])   . Dyspnea     Past Surgical History:  Procedure Laterality Date  . AMPUTATION Left 06/24/2016   Procedure: FOOT FIFTH RAY TOE AMPUTATION;  Surgeon: Newt Minion, MD;  Location: [facility];  Service: Orthopedics;  Laterality: Left;  . IR PARACENTESIS  11/12/2020  . IR PARACENTESIS  11/13/2020  . IR  PARACENTESIS  11/16/2020  . open heart surgery     As a child.  48 years old.  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   [name] RIGHT/LEFT HEART CATH AND CORONARY ANGIOGRAPHY N/A 03/30/2017   Procedure: Right/Left Heart Cath and Coronary Angiography;  Surgeon: Larey Dresser, MD;   Location: [facility];  Service: Cardiovascular;  Laterality: N/A;    Family History  Problem Relation Age of Onset  . Diabetes Mellitus II Mother        ESRD  . Diabetes Mellitus II Brother   . Emphysema Father     No Known Allergies  Outpatient Medications Prior to Visit  Medication Sig Dispense Refill  . Accu-Chek Softclix Lancets lancets Use to check blood sugar daily. 100 each 2  . albuterol (PROVENTIL HFA;VENTOLIN HFA) 108 (90 Base) MCG/ACT inhaler Inhale 2 puffs into the lungs every 6 (six) hours as needed for wheezing or shortness of breath. 1 Inhaler 0  . amLODipine (NORVASC) 10 MG tablet Take 1 tablet (10 mg total) by mouth daily. 30 tablet 0  . atorvastatin (LIPITOR) 20 MG tablet Take 1 tablet (20 mg total) by mouth daily. 30 tablet 6  . Blood Glucose Monitoring Suppl (ACCU-CHEK GUIDE ME) w/Device KIT Use to check blood sugar daily. 1 kit 0  . cetirizine (ZYRTEC) 10 MG tablet Take 1 tablet (10 mg total) by mouth daily. 30 tablet 1  . dapagliflozin propanediol (FARXIGA) 5 MG TABS tablet Take 1 tablet (5 mg total) by mouth daily before breakfast. 30 tablet 6  . glucose blood (ACCU-CHEK GUIDE) test strip Use to check blood sugar daily. 100 each 3  . Misc. Devices MISC Blood pressure monitor.  Diagnosis- hypertension 1 each 0  . pantoprazole (PROTONIX) 40 MG tablet Take 1 tablet (40 mg total) by mouth daily. 30 tablet 3  . rivaroxaban (XARELTO) 20 MG TABS tablet TAKE 1 TABLET BY MOUTH DAILY WITH SUPPER. (Patient taking differently: Take 20 mg by mouth daily with supper.) 90 tablet 1  . spironolactone (ALDACTONE) 25 MG tablet Take 1 tablet (25 mg total) by mouth daily. 30 tablet 0  . torsemide (DEMADEX) 20 MG tablet Take 2 tablets (40 mg total) by mouth 2 (two) times daily. 120 tablet 3   No facility-administered medications prior to visit.     ROS Review of Systems General: negative for fever, weight loss, appetite change Eyes: no visual symptoms. ENT: no ear  symptoms, no sinus tenderness, no nasal congestion or sore throat. Neck: no pain  Respiratory: no wheezing, shortness of breath, cough Cardiovascular: no chest pain, no dyspnea on exertion, no pedal edema, no orthopnea. Gastrointestinal: no abdominal pain, no diarrhea, no constipation Genito-Urinary: no urinary frequency, no dysuria, no polyuria. Hematologic: + bruising Endocrine: no cold or heat intolerance Neurological: no headaches, no seizures, no tremors Musculoskeletal: no joint pains, no joint swelling Skin: no pruritus, no rash. Psychological: no depression, no anxiety,    Objective:  BP (!) 136/55   Pulse 72   Ht 6' 1"  (1.854 m)   Wt 233 lb (105.7 kg)   SpO2 98%   BMI 30.74 kg/m   BP/Weight 11/24/2020 10/21/9240 02/17/3418  Systolic BP 622 297 -  Diastolic BP 55 63 -  Wt. (Lbs) 233 - 277.56  BMI 30.74 - -      Physical Exam Constitutional: normal appearing,  Eyes: PERRLA HEENT: Head is atraumatic, normal sinuses, normal oropharynx, normal appearing tonsils and palate, tympanic membrane is normal bilaterally. Neck: normal range of motion, no thyromegaly,  no JVD Cardiovascular: normal rate and rhythm, normal heart sounds, no murmurs, rub or gallop, no pedal edema Respiratory: Normal breath sounds, clear to auscultation bilaterally, no wheezes, no rales, no rhonchi Abdomen: soft, not tender to palpation, normal bowel sounds, no enlarged organs Musculoskeletal: Full ROM, no tenderness in joints Skin: warm and dry, no lesions. Neurological: alert, oriented x3, cranial nerves I-XII grossly intact , normal motor strength, normal sensation. Psychological: normal mood.   CMP Latest Ref Rng & Units 11/18/2020 11/17/2020 11/16/2020  Glucose 70 - 99 mg/dL 161(H) 125(H) 253(H)  BUN 8 - 23 mg/dL 30(H) 28(H) 29(H)  Creatinine 0.61 - 1.24 mg/dL 1.21 1.16 1.38(H)  Sodium 135 - 145 mmol/L 133(L) 131(L) 129(L)  Potassium 3.5 - 5.1 mmol/L 4.5 4.7 4.5  Chloride 98 - 111 mmol/L 99 99  97(L)  CO2 22 - 32 mmol/L 25 25 24   Calcium 8.9 - 10.3 mg/dL 8.5(L) 8.8(L) 8.6(L)  Total Protein 6.5 - 8.1 g/dL 5.6(L) 5.8(L) 6.0(L)  Total Bilirubin 0.3 - 1.2 mg/dL 0.9 1.0 0.8  Alkaline Phos 38 - 126 U/L 75 76 96  AST 15 - 41 U/L 20 14(L) 16  ALT 0 - 44 U/L 17 13 11     Lipid Panel     Component Value Date/Time   CHOL 120 12/31/2019 1520   TRIG 61 12/31/2019 1520   HDL 33 (L) 12/31/2019 1520   CHOLHDL 3.6 12/31/2019 1520   CHOLHDL 2.3 10/03/2017 1457   VLDL 14 10/03/2017 1457   LDLCALC 74 12/31/2019 1520    CBC    Component Value Date/Time   WBC 6.0 11/18/2020 0120   RBC 3.20 (L) 11/18/2020 0120   HGB 10.0 (L) 11/18/2020 0120   HGB 9.9 (L) 03/16/2017 1644   HCT 29.5 (L) 11/18/2020 0120   HCT 30.1 (L) 03/16/2017 1644   PLT 242 11/18/2020 0120   PLT 222 03/16/2017 1644   MCV 92.2 11/18/2020 0120   MCV 97 03/16/2017 1644   MCH 31.3 11/18/2020 0120   MCHC 33.9 11/18/2020 0120   RDW 14.0 11/18/2020 0120   RDW 13.8 03/16/2017 1644   LYMPHSABS 0.8 11/18/2020 0120   MONOABS 0.4 11/18/2020 0120   EOSABS 0.1 11/18/2020 0120   BASOSABS 0.0 11/18/2020 0120    Lab Results  Component Value Date   HGBA1C 5.3 11/10/2020    Assessment & Plan:  1. Cirrhosis of liver with ascites, unspecified hepatic cirrhosis type ([place]) New diagnosis Status post IR paracentesis with removal of 20 L of ascitic fluid Continue spironolactone, PPI He is also on torsemide Counseled on daily weight checks and he is to notify me if he notices an increase of over 5 pounds in 1 week or greater than 3 pounds in 1 day - Ambulatory referral to Gastroenterology  2. Acute on chronic combined systolic and diastolic congestive heart failure (HCC) EF of 55% He is euvolemic at this time Counseled on daily weight checks, medication compliance, low-sodium and fluid restriction Keep appointment with cardiology   3. Type 2 diabetes mellitus with complication, without long-term current use of insulin  (HCC) Controlled with A1c of 5.3 Recent elevations in blood sugar at home I had placed him on Farxiga and he has been advised to review his medications at home to ensure he has this and if he is on 5 mg of Farxiga and blood sugar remains elevated consider increase to 10 mg Counseled on Diabetic diet, my plate method, 773 minutes of moderate intensity exercise/week Blood sugar logs with fasting  goals of 80-120 mg/dl, random of less than 180 and in the event of sugars less than 60 mg/dl or greater than 400 mg/dl encouraged to notify the clinic. Advised on the need for annual eye exams, annual foot exams, Pneumonia vaccine.   4. Hypertensive heart disease with chronic combined systolic and diastolic congestive heart failure ([place]) See #2 above Hypertension is controlled Counseled on blood pressure goal of less than 130/80, low-sodium, DASH diet, medication compliance, 150 minutes of moderate intensity exercise per week. Discussed medication compliance, adverse effects.  5. Paroxysmal atrial fibrillation (HCC) Currently in sinus rhythm Continue Eliquis for anticoagulation Metoprolol discontinued during hospitalization due to bradycardia    No orders of the defined types were placed in this encounter.   Follow-up: Return in about 3 months (around 02/24/2021) for Chronic disease management.       Charlott Rakes, MD, FAAFP. [facility] and [place], [place]   11/24/2020, 1:06 PM

## 2020-11-24 NOTE — Progress Notes (Signed)
HFU States that he is doing better.

## 2020-12-08 ENCOUNTER — Other Ambulatory Visit: Payer: Self-pay | Admitting: Family Medicine

## 2020-12-08 DIAGNOSIS — E118 Type 2 diabetes mellitus with unspecified complications: Secondary | ICD-10-CM

## 2020-12-08 DIAGNOSIS — I5043 Acute on chronic combined systolic (congestive) and diastolic (congestive) heart failure: Secondary | ICD-10-CM

## 2020-12-08 MED ORDER — DAPAGLIFLOZIN PROPANEDIOL 10 MG PO TABS: 10.0000 mg | ORAL_TABLET | Freq: Every day | ORAL | 3 refills | Status: DC

## 2020-12-09 MED FILL — FARXIGA 10 MG TABLET: 10 | 30 days supply | Qty: 30 | Fill #0

## 2020-12-10 ENCOUNTER — Encounter (HOSPITAL_COMMUNITY): Payer: Self-pay

## 2020-12-10 ENCOUNTER — Other Ambulatory Visit: Payer: Self-pay

## 2020-12-10 ENCOUNTER — Ambulatory Visit (HOSPITAL_COMMUNITY)
Admit: 2020-12-10 | Discharge: 2020-12-10 | Disposition: A | Discharge status: Home / Self Care | Payer: Medicaid Other | Source: Ambulatory Visit | Attending: Cardiology | Admitting: Cardiology

## 2020-12-10 VITALS — BP 146/88 | HR 60 | Wt 245.2 lb

## 2020-12-10 DIAGNOSIS — I4821 Permanent atrial fibrillation: Secondary | ICD-10-CM | POA: Diagnosis not present

## 2020-12-10 DIAGNOSIS — I2510 Atherosclerotic heart disease of native coronary artery without angina pectoris: Secondary | ICD-10-CM | POA: Insufficient documentation

## 2020-12-10 DIAGNOSIS — Z7901 Long term (current) use of anticoagulants: Secondary | ICD-10-CM | POA: Diagnosis not present

## 2020-12-10 DIAGNOSIS — E1122 Type 2 diabetes mellitus with diabetic chronic kidney disease: Secondary | ICD-10-CM | POA: Diagnosis not present

## 2020-12-10 DIAGNOSIS — I272 Pulmonary hypertension, unspecified: Secondary | ICD-10-CM

## 2020-12-10 DIAGNOSIS — I5032 Chronic diastolic (congestive) heart failure: Secondary | ICD-10-CM

## 2020-12-10 DIAGNOSIS — I130 Hypertensive heart and chronic kidney disease with heart failure and stage 1 through stage 4 chronic kidney disease, or unspecified chronic kidney disease: Secondary | ICD-10-CM | POA: Diagnosis not present

## 2020-12-10 DIAGNOSIS — Z7984 Long term (current) use of oral hypoglycemic drugs: Secondary | ICD-10-CM | POA: Insufficient documentation

## 2020-12-10 DIAGNOSIS — N183 Chronic kidney disease, stage 3 unspecified: Secondary | ICD-10-CM | POA: Diagnosis not present

## 2020-12-10 DIAGNOSIS — Z8616 Personal history of COVID-19: Secondary | ICD-10-CM | POA: Insufficient documentation

## 2020-12-10 DIAGNOSIS — Z79899 Other long term (current) drug therapy: Secondary | ICD-10-CM | POA: Diagnosis not present

## 2020-12-10 LAB — BASIC METABOLIC PANEL
Anion gap: 8 (ref 5–15)
BUN: 29 mg/dL — ABNORMAL HIGH (ref 8–23)
CO2: 28 mmol/L (ref 22–32)
Calcium: 9.1 mg/dL (ref 8.9–10.3)
Chloride: 100 mmol/L (ref 98–111)
Creatinine, Ser: 1.33 mg/dL — ABNORMAL HIGH (ref 0.61–1.24)
GFR, Estimated: >60 mL/min (ref >60)
Glucose, Bld: 141 mg/dL — ABNORMAL HIGH (ref 70–99)
Potassium: 3.9 mmol/L (ref 3.5–5.1)
Sodium: 136 mmol/L (ref 135–145)

## 2020-12-10 LAB — BRAIN NATRIURETIC PEPTIDE: B Natriuretic Peptide: 213.2 pg/mL — ABNORMAL HIGH (ref 0.0–100.0)

## 2020-12-10 NOTE — Patient Instructions (Addendum)
Labs done today. We will contact you only if your labs are abnormal.  No medication changes were made. Please continue all current medications as prescribed.  Your physician recommends that you schedule a follow-up appointment soon for an echo and in 2 months for an appointment with Dr. Shirlee Latch.  Your physician has requested that you have an echocardiogram. Echocardiography is a painless test that uses sound waves to create images of your heart. It provides your doctor with information about the size and shape of your heart and how well your heart's chambers and valves are working. This procedure takes approximately one hour. There are no restrictions for this procedure.   If you have any questions or concerns before your next appointment please send [place] a message through [place] or call our office at [phone].    TO LEAVE A MESSAGE FOR THE NURSE SELECT OPTION 2, PLEASE LEAVE A MESSAGE INCLUDING: . YOUR NAME . DATE OF BIRTH . CALL BACK NUMBER . REASON FOR CALL**this is important as we prioritize the call backs  YOU WILL RECEIVE A CALL BACK THE SAME DAY AS LONG AS YOU CALL BEFORE 4:00 PM   Do the following things EVERYDAY: 1) Weigh yourself in the morning before breakfast. Write it down and keep it in a log. 2) Take your medicines as prescribed 3) Eat low salt foods--Limit salt (sodium) to 2000 mg per day.  4) Stay as active as you can everyday 5) Limit all fluids for the day to less than 2 liters   At the [facility], you and your health needs are our priority. As part of our continuing mission to provide you with exceptional heart care, we have created designated Provider Care Teams. These Care Teams include your primary Cardiologist (physician) and Advanced Practice Providers (APPs- Physician Assistants and Nurse Practitioners) who all work together to provide you with the care you need, when you need it.   You may see any of the following providers on your  designated Care Team at your next follow up: [name] Dr Arvilla Meres . Dr Marca Ancona . Tonye Becket, NP . Robbie Lis, PA . Karle Plumber, PharmD   Please be sure to bring in all your medications bottles to every appointment.  '

## 2020-12-11 ENCOUNTER — Encounter (HOSPITAL_COMMUNITY): Payer: Self-pay

## 2020-12-11 NOTE — Progress Notes (Signed)
[facility] Note   PCP: Dr. Jarold Song  HF Cardiology: Dr. Aundra Dubin   HPI: Paul Mclaughlin is a 65 year old male with a past medical history of chronic diastolic CHF, atrial fibrillation, DM, and congenital heart disease. He was seen in Dr. Rosezella [place] office on 03/16/17 and set up for a right and left heart cath with concerns for type I pulmonary HTN.   He was admitted in 7/18 after right and left heart cath with elevated filling pressures and severe pulmonary HTN. Results below, predominantly pulmonary venous HTN with PVR 3.1 WU. Also with 75% stenosis in the distal LAD. He was started on IV diuresis and diuresed 34 pounds total. Transitioned to po torsemide 60 mg daily (had previously been on Lasix) at home. During his hospitalization he was noted to have transient, asymptomatic episodes of bradycardia. His metoprolol was reduced to 25 mg BID. Discharge weight was 276 pounds.   He was unfortunately lost to f/u in the [facility] for a period of time, not seen since 2019.   He was recently admitted by [facility] earlier this month from 3/1-3/9 w/ acute hypoxic respiratory failure due to a/c diastolic heart failure w/ prominent RV dysfunction w/ massive anasarca/ ascites requiring paracentesis and IV Lasix. Also w/ viral pneumonitis  in the setting of acute COVID infection, treated w/ steroids and Remdesivir. 2D echo showed normal LVEF at 55%. RV noted to be severely enlarged w/ moderately reduced systolic function and severely elevated pulmonary artery systolic pressure, 85.2 mmHg w/ D-shaped  interventricular septum suggestive of RV pressure/volume overload.  V/Q scan was negative for PE. He was diuresed w/ IV Lasix down to 276 lb w/ significant symptomatic improvement. He was transitioned back to PO torsemide at 40 mg bid and instructed to f/u in the [facility].   He presents today for f/u. Reports doing well since discharge. Respiratory status remains good, overall much improved. NYHA Class II. Denies resting  dyspnea. No orthopnea/PND. Denies any significant wt gain post d/c. His weight is down, significantly lower than d/c wt at 245 lb today. Reports full compliance w/ meds and good urinary response to torsemide.   2D Echo 11/14/20 1. Left ventricular ejection fraction, by estimation, is 55%. The left ventricle has normal function. The left ventricle has no regional wall motion abnormalities. Left ventricular diastolic parameters are indeterminate. 2. Right ventricular systolic function is moderately reduced. The right ventricular size is severely enlarged. There is severely elevated pulmonary artery systolic pressure. The estimated right ventricular systolic pressure is 77.8 mmHg. D-shaped interventricular septum suggestive of RV pressure/volume overload. 3. Left atrial size was moderately dilated. 4. Right atrial size was moderately dilated. 5. The mitral valve is normal in structure. Trivial mitral valve regurgitation. No evidence of mitral stenosis. 6. Tricuspid valve regurgitation is mild to moderate. 7. The aortic valve is tricuspid. Aortic valve regurgitation is not visualized. Mild aortic valve sclerosis is present, with no evidence of aortic valve stenosis. 8. The inferior vena cava is dilated in size with <50% respiratory variability, suggesting right atrial pressure of 15 mmHg. 9. Prominent ascites noted. 10. The patient was in atrial fibrillation.  V/Q Scan 3/22 - negative for PE     Right/Left Heart Cath and Coronary Angiography 03/30/17 1. Markedly elevated right and left heart filling pressures.  2. Severe pulmonary hypertension, but likely predominantly pulmonary venous hypertension with PVR 3.1 WU.  3. Most significant coronary lesion was 75% distal LAD stenosis, but vessel is small caliber at this point.  Doubt this contributes to his symptoms.  Diabetic-appearing vessels with diffuse disease.  RA mean 23 RV 90/23 PA 94/35, mean 56 PCWP mean 31 LV 155/31 AO  151/86 Oxygen saturations: RA 66% RV 65% PA 61% AO 98% Cardiac Output (Fick) 8.03  Cardiac Index (Fick) 3.09 PVR 3.1 WU  Labs (8/18): creatinine 1.71 Labs (9/18): K 4.7, creatinine 1.37 Labs (1/19): LDL 46, HDL 45 Labs (2/19): K 4.2, creatinine 1.21 Labs (3/22): SCr 1.38, K 4.5     Past Medical History:  Diagnosis Date  . Atrial fibrillation ([place])   . Cellulitis and abscess of left leg 06/2016  . CHF (congestive heart failure) ([place])   . Diabetes ([place])   . Dyspnea    Current Outpatient Medications  Medication Sig Dispense Refill  . Accu-Chek Softclix Lancets lancets Use to check blood sugar daily. 100 each 2  . albuterol (PROVENTIL HFA;VENTOLIN HFA) 108 (90 Base) MCG/ACT inhaler Inhale 2 puffs into the lungs every 6 (six) hours as needed for wheezing or shortness of breath. 1 Inhaler 0  . amLODipine (NORVASC) 10 MG tablet Take 1 tablet (10 mg total) by mouth daily. 30 tablet 0  . atorvastatin (LIPITOR) 20 MG tablet Take 1 tablet (20 mg total) by mouth daily. 30 tablet 6  . Blood Glucose Monitoring Suppl (ACCU-CHEK GUIDE ME) w/Device KIT Use to check blood sugar daily. 1 kit 0  . cetirizine (ZYRTEC) 10 MG tablet Take 1 tablet (10 mg total) by mouth daily. 30 tablet 1  . dapagliflozin propanediol (FARXIGA) 10 MG TABS tablet Take 20 mg by mouth daily.    [name] glucose blood (ACCU-CHEK GUIDE) test strip Use to check blood sugar daily. 100 each 3  . Misc. Devices MISC Blood pressure monitor.  Diagnosis- hypertension 1 each 0  . pantoprazole (PROTONIX) 40 MG tablet Take 1 tablet (40 mg total) by mouth daily. 30 tablet 3  . rivaroxaban (XARELTO) 20 MG TABS tablet TAKE 1 TABLET BY MOUTH DAILY WITH SUPPER. (Patient taking differently: Take 20 mg by mouth daily with supper.) 90 tablet 1  . spironolactone (ALDACTONE) 25 MG tablet Take 1 tablet (25 mg total) by mouth daily. 30 tablet 0  . torsemide (DEMADEX) 20 MG tablet Take 2 tablets (40 mg total) by mouth 2 (two) times daily. 120 tablet 3    No current facility-administered medications for this encounter.   No Known Allergies  Social History   Socioeconomic History  . Marital status: Divorced    Spouse name: Not on file  . Number of children: 1  . Years of education: Not on file  . Highest education level: Not on file  Occupational History  . Not on file  Tobacco Use  . Smoking status: Never Smoker  . Smokeless tobacco: Never Used  Vaping Use  . Vaping Use: Never used  Substance and Sexual Activity  . Alcohol use: No    Comment: gave up drinking ~20 years ago  . Drug use: No    Types: Marijuana    Comment: not since the 1st of January  . Sexual activity: Not on file  Other Topics Concern  . Not on file  Social History Narrative   Lives alone    Social Determinants of Health   Financial Resource Strain: Not on file  Food Insecurity: Not on file  Transportation Needs: Not on file  Physical Activity: Not on file  Stress: Not on file  Social Connections: Not on file  Intimate Partner Violence: Not on file  Family History  Problem Relation Age of Onset  . Diabetes Mellitus II Mother        ESRD  . Diabetes Mellitus II Brother   . Emphysema Father    Vitals:   12/10/20 1434  BP: (!) 146/88  Pulse: 60  SpO2: 98%  Weight: 111.2 kg   Wt Readings from Last 3 Encounters:  12/10/20 111.2 kg  11/24/20 105.7 kg  11/15/20 125.9 kg    PHYSICAL EXAM: General:  Well appearing, obese WM No respiratory difficulty HEENT: normal Neck: supple. JVD not well visualized (heavily bearded). Carotids 2+ bilat; no bruits. No lymphadenopathy or thyromegaly appreciated. Cor: PMI nondisplaced. Regular rate & rhythm. No rubs, gallops or murmurs. Lungs: clear Abdomen: obese but soft, nontender, nondistended. No hepatosplenomegaly. No bruits or masses. Good bowel sounds. Extremities: no cyanosis, clubbing, rash, edema Neuro: alert & oriented x 3, cranial nerves grossly intact. moves all 4 extremities w/o  difficulty. Affect pleasant.   ASSESSMENT & PLAN:  1. Pulmonary hypertension: Primarily pulmonary venous hypertension, WHO Group 2, on RHC in 7/18.  No role for selective pulmonary vasodilators. PFTs in 2018 c/w moderate-severe obstructive airway disease  - recent admit 3/22 for a/c CHF w/ prominent RV failure. 2D echo w/ severely elevated pulmonary artery systolic pressure, 16.1 mmHg w/ D-shaped  interventricular septum suggestive of RV pressure/volume overload. Repeat RHC not perused. V/Q scan was negative for PE.  - will plan repeat 2D echo to reassess RVSP now that he has been diuresed. If still elevated, may need repeat outpatient RHC - will arrange sleep study to r/o OSA   2. Chronic diastolic HF w/ Prominent RV Failure: Echo in 3/18 with LV EF 60-65%, RV severely dilated with moderate to severely decreased systolic function. Filling pressures markedly elevated on RHC. Recent admit per above, 3/22, w/ a/c RV failure w/ marked fluid overload and anasarca/ ascites requiring paracentesis. Repeat echo showed  normal LVEF at 55%. RV noted to be severely enlarged w/ moderately reduced systolic function and severely elevated pulmonary artery systolic pressure, 09.6 mmHg w/ D-shaped  interventricular septum suggestive of RV pressure/volume overload. He was diuresed w/ improvement in volume and symptoms and transitioned back to PO torsemide.  - reports feeling well post d/c, NYHA Class II. Wt down from d/c wt. Volume assessment is a bit challenging due to body habitus - will check f/u BNP  - continue torsemide 40 mg bid - repeat echo to reassess RVSP now that he has been diuresed, may need RHC if still markedly elevated - discussed low sodium diet and daily wts  3. Moderate non-obstructive CAD: 75% distal LAD lesion on 7/18 cath. Stable w/o CP  - Continue atorvastatin   - No ASA with need for Xarelto and stable CAD  4. Afib: Permanent. Good rate control.  - Continue Xarelto for anticoagulation.  Denies abnormal bleeding  5. Congenital Heart Disease:  Underwent surgical repair at age 3. He is not sure what type of congenital issue he had. 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  [place] in 7/18 did not suggest residual defect (no evidence for significant shunt).   6. HTN: controlled on current regimen.   7. CKD: Stage 3. Last SCr ~1.3 - check BMP today   F/u w/ Dr. Aundra Dubin in 2 months or sooner if echo still shows markedly elevated RVSP. May need outpatient [place], [place]  12/11/2020

## 2020-12-12 ENCOUNTER — Other Ambulatory Visit: Payer: Self-pay

## 2020-12-14 ENCOUNTER — Other Ambulatory Visit: Payer: Self-pay | Admitting: Internal Medicine

## 2020-12-14 ENCOUNTER — Other Ambulatory Visit: Payer: Self-pay

## 2020-12-14 MED FILL — Torsemide Tab 20 MG: ORAL | 35 days supply | Qty: 75 | Fill #0 | Status: AC

## 2020-12-14 MED FILL — Pantoprazole Sodium EC Tab 40 MG (Base Equiv): ORAL | 30 days supply | Qty: 30 | Fill #0 | Status: CN

## 2020-12-15 ENCOUNTER — Other Ambulatory Visit: Payer: Self-pay

## 2020-12-17 ENCOUNTER — Other Ambulatory Visit: Payer: Self-pay

## 2020-12-17 ENCOUNTER — Other Ambulatory Visit: Payer: Self-pay | Admitting: Pharmacist

## 2020-12-17 MED ORDER — AMLODIPINE BESYLATE 10 MG PO TABS
10.0000 mg | ORAL_TABLET | Freq: Every day | ORAL | 2 refills | Status: DC
  Filled 2020-12-17: qty 30, 30d supply, fill #0
  Filled 2021-01-18: qty 30, 30d supply, fill #1
  Filled 2021-03-01: qty 30, 30d supply, fill #2

## 2020-12-17 MED FILL — Pantoprazole Sodium EC Tab 40 MG (Base Equiv): ORAL | 30 days supply | Qty: 30 | Fill #0 | Status: AC

## 2020-12-30 ENCOUNTER — Other Ambulatory Visit: Payer: Self-pay

## 2020-12-30 MED FILL — Atorvastatin Calcium Tab 20 MG (Base Equivalent): ORAL | 30 days supply | Qty: 30 | Fill #0 | Status: AC

## 2020-12-31 ENCOUNTER — Other Ambulatory Visit: Payer: Self-pay

## 2021-01-01 ENCOUNTER — Other Ambulatory Visit: Payer: Self-pay

## 2021-01-11 ENCOUNTER — Other Ambulatory Visit: Payer: Self-pay

## 2021-01-11 ENCOUNTER — Ambulatory Visit (HOSPITAL_COMMUNITY)
Admission: RE | Admit: 2021-01-11 | Discharge: 2021-01-11 | Disposition: A | Discharge status: Home / Self Care | Payer: Medicaid Other | Source: Ambulatory Visit | Attending: Family Medicine | Admitting: Family Medicine

## 2021-01-11 DIAGNOSIS — I5032 Chronic diastolic (congestive) heart failure: Secondary | ICD-10-CM | POA: Diagnosis not present

## 2021-01-11 DIAGNOSIS — I700 Atherosclerosis of aorta: Secondary | ICD-10-CM | POA: Insufficient documentation

## 2021-01-11 DIAGNOSIS — I071 Rheumatic tricuspid insufficiency: Secondary | ICD-10-CM | POA: Diagnosis not present

## 2021-01-11 DIAGNOSIS — I272 Pulmonary hypertension, unspecified: Secondary | ICD-10-CM | POA: Diagnosis not present

## 2021-01-11 DIAGNOSIS — I4819 Other persistent atrial fibrillation: Secondary | ICD-10-CM | POA: Diagnosis not present

## 2021-01-11 DIAGNOSIS — E119 Type 2 diabetes mellitus without complications: Secondary | ICD-10-CM | POA: Insufficient documentation

## 2021-01-11 LAB — ECHOCARDIOGRAM COMPLETE
Area-P 1/2: 3.53 cm2
S' Lateral: 3.3 cm

## 2021-01-11 MED ORDER — DAPAGLIFLOZIN PROPANEDIOL 10 MG PO TABS
ORAL_TABLET | Freq: Every day | ORAL | 1 refills | Status: DC
  Filled 2021-01-11: qty 30, 30d supply, fill #0
  Filled 2021-02-10: qty 30, 30d supply, fill #1

## 2021-01-11 NOTE — Progress Notes (Signed)
  Echocardiogram 2D Echocardiogram has been performed.  Paul Mclaughlin 01/11/2021, 3:41 PM

## 2021-01-12 ENCOUNTER — Other Ambulatory Visit: Payer: Self-pay

## 2021-01-12 MED FILL — Pantoprazole Sodium EC Tab 40 MG (Base Equiv): ORAL | 30 days supply | Qty: 30 | Fill #1 | Status: AC

## 2021-01-13 ENCOUNTER — Other Ambulatory Visit: Payer: Self-pay

## 2021-01-18 ENCOUNTER — Other Ambulatory Visit: Payer: Self-pay

## 2021-01-18 ENCOUNTER — Other Ambulatory Visit: Payer: Self-pay | Admitting: Family Medicine

## 2021-01-18 DIAGNOSIS — I110 Hypertensive heart disease with heart failure: Secondary | ICD-10-CM

## 2021-01-18 DIAGNOSIS — I5042 Chronic combined systolic (congestive) and diastolic (congestive) heart failure: Secondary | ICD-10-CM

## 2021-01-18 MED ORDER — TORSEMIDE 20 MG PO TABS
ORAL_TABLET | ORAL | 1 refills | Status: DC
  Filled 2021-01-18: qty 75, 35d supply, fill #0

## 2021-01-18 NOTE — Telephone Encounter (Signed)
Requested Prescriptions  Pending Prescriptions Disp Refills  . torsemide (DEMADEX) 20 MG tablet 75 tablet 0    Sig: ALTERNATE 60 MG (3 TABS), AND 40 MG (2 TABS) DAILY     Cardiovascular:  Diuretics - Loop Failed - 01/18/2021  2:11 PM      Failed - Cr in normal range and within 360 days    Creat  Date Value Ref Range Status  11/29/2016 1.23 0.70 - 1.33 mg/dL Final    Comment:      For patients > or = 64 years of age: The upper reference limit for Creatinine is approximately 13% higher for people identified as African-American.      Creatinine, Ser  Date Value Ref Range Status  12/10/2020 1.33 (H) 0.61 - 1.24 mg/dL Final   Creatinine, Urine  Date Value Ref Range Status  11/29/2016 CANCELED 20 - 370 mg/dL     Comment:    Test not performed, no urine was received.    Result canceled by the ancillary          Failed - Last BP in normal range    BP Readings from Last 1 Encounters:  12/10/20 (!) 146/88         Passed - K in normal range and within 360 days    Potassium  Date Value Ref Range Status  12/10/2020 3.9 3.5 - 5.1 mmol/L Final         Passed - Ca in normal range and within 360 days    Calcium  Date Value Ref Range Status  12/10/2020 9.1 8.9 - 10.3 mg/dL Final         Passed - Na in normal range and within 360 days    Sodium  Date Value Ref Range Status  12/10/2020 136 135 - 145 mmol/L Final  05/13/2020 140 134 - 144 mmol/L Final         Passed - Valid encounter within last 6 months    Recent Outpatient Visits          1 month ago Cirrhosis of liver with ascites, unspecified hepatic cirrhosis type (HCC)   [facility] [place], [place], MD   2 months ago Type 2 diabetes mellitus with complication, without long-term current use of insulin (HCC)   [facility] [place], [place], MD   8 months ago Type 2 diabetes mellitus with complication, without long-term current use of insulin (HCC)   [facility] [place], [place], MD   1 year ago Type 2 diabetes mellitus with complication, without long-term current use of insulin (HCC)    [facility] [place], [place], MD   2 years ago Type 2 diabetes mellitus with complication, without long-term current use of insulin (HCC)    [facility] [name], MD      Future Appointments            In 1 month [name], MD [facility]

## 2021-01-20 ENCOUNTER — Other Ambulatory Visit: Payer: Self-pay

## 2021-01-20 MED FILL — Rivaroxaban Tab 20 MG: ORAL | 90 days supply | Qty: 90 | Fill #0 | Status: AC

## 2021-01-22 ENCOUNTER — Other Ambulatory Visit: Payer: Self-pay

## 2021-02-02 ENCOUNTER — Other Ambulatory Visit: Payer: Self-pay

## 2021-02-02 MED FILL — Atorvastatin Calcium Tab 20 MG (Base Equivalent): ORAL | 30 days supply | Qty: 30 | Fill #1 | Status: AC

## 2021-02-03 ENCOUNTER — Other Ambulatory Visit: Payer: Self-pay

## 2021-02-10 ENCOUNTER — Other Ambulatory Visit: Payer: Self-pay

## 2021-02-12 ENCOUNTER — Other Ambulatory Visit: Payer: Self-pay

## 2021-02-12 ENCOUNTER — Ambulatory Visit (HOSPITAL_COMMUNITY)
Admission: RE | Admit: 2021-02-12 | Discharge: 2021-02-12 | Disposition: A | Discharge status: Home / Self Care | Payer: Medicaid Other | Source: Ambulatory Visit | Attending: Cardiology | Admitting: Cardiology

## 2021-02-12 ENCOUNTER — Encounter (HOSPITAL_COMMUNITY): Payer: Self-pay | Admitting: Cardiology

## 2021-02-12 VITALS — BP 142/68 | HR 69 | Ht 73.0 in | Wt 228.2 lb

## 2021-02-12 DIAGNOSIS — I5042 Chronic combined systolic (congestive) and diastolic (congestive) heart failure: Secondary | ICD-10-CM | POA: Diagnosis not present

## 2021-02-12 DIAGNOSIS — I4510 Unspecified right bundle-branch block: Secondary | ICD-10-CM | POA: Insufficient documentation

## 2021-02-12 DIAGNOSIS — K7460 Unspecified cirrhosis of liver: Secondary | ICD-10-CM | POA: Diagnosis not present

## 2021-02-12 DIAGNOSIS — I272 Pulmonary hypertension, unspecified: Secondary | ICD-10-CM | POA: Diagnosis not present

## 2021-02-12 DIAGNOSIS — Z7984 Long term (current) use of oral hypoglycemic drugs: Secondary | ICD-10-CM | POA: Insufficient documentation

## 2021-02-12 DIAGNOSIS — Z79899 Other long term (current) drug therapy: Secondary | ICD-10-CM | POA: Diagnosis not present

## 2021-02-12 DIAGNOSIS — I5032 Chronic diastolic (congestive) heart failure: Secondary | ICD-10-CM

## 2021-02-12 DIAGNOSIS — I5030 Unspecified diastolic (congestive) heart failure: Secondary | ICD-10-CM | POA: Diagnosis not present

## 2021-02-12 DIAGNOSIS — Z7901 Long term (current) use of anticoagulants: Secondary | ICD-10-CM | POA: Insufficient documentation

## 2021-02-12 DIAGNOSIS — I110 Hypertensive heart disease with heart failure: Secondary | ICD-10-CM | POA: Diagnosis not present

## 2021-02-12 DIAGNOSIS — I130 Hypertensive heart and chronic kidney disease with heart failure and stage 1 through stage 4 chronic kidney disease, or unspecified chronic kidney disease: Secondary | ICD-10-CM | POA: Insufficient documentation

## 2021-02-12 DIAGNOSIS — I4821 Permanent atrial fibrillation: Secondary | ICD-10-CM | POA: Insufficient documentation

## 2021-02-12 DIAGNOSIS — Z8616 Personal history of COVID-19: Secondary | ICD-10-CM | POA: Insufficient documentation

## 2021-02-12 DIAGNOSIS — I2510 Atherosclerotic heart disease of native coronary artery without angina pectoris: Secondary | ICD-10-CM | POA: Insufficient documentation

## 2021-02-12 DIAGNOSIS — E1122 Type 2 diabetes mellitus with diabetic chronic kidney disease: Secondary | ICD-10-CM | POA: Insufficient documentation

## 2021-02-12 DIAGNOSIS — N183 Chronic kidney disease, stage 3 unspecified: Secondary | ICD-10-CM | POA: Diagnosis not present

## 2021-02-12 LAB — BASIC METABOLIC PANEL
Anion gap: 10 (ref 5–15)
BUN: 34 mg/dL — ABNORMAL HIGH (ref 8–23)
CO2: 28 mmol/L (ref 22–32)
Calcium: 9.6 mg/dL (ref 8.9–10.3)
Chloride: 99 mmol/L (ref 98–111)
Creatinine, Ser: 1.46 mg/dL — ABNORMAL HIGH (ref 0.61–1.24)
GFR, Estimated: 53 mL/min — ABNORMAL LOW (ref >60)
Glucose, Bld: 202 mg/dL — ABNORMAL HIGH (ref 70–99)
Potassium: 4.2 mmol/L (ref 3.5–5.1)
Sodium: 137 mmol/L (ref 135–145)

## 2021-02-12 LAB — HEPATITIS C ANTIBODY: HCV Ab: NONREACTIVE

## 2021-02-12 LAB — BRAIN NATRIURETIC PEPTIDE: B Natriuretic Peptide: 134.5 pg/mL — ABNORMAL HIGH (ref 0.0–100.0)

## 2021-02-12 LAB — HEPATITIS A ANTIBODY, TOTAL: hep A Total Ab: NONREACTIVE

## 2021-02-12 LAB — HEPATITIS B SURFACE ANTIGEN: Hepatitis B Surface Ag: NONREACTIVE

## 2021-02-12 MED ORDER — TORSEMIDE 20 MG PO TABS
ORAL_TABLET | ORAL | 1 refills | Status: DC
  Filled 2021-02-12: qty 75, 28d supply, fill #0

## 2021-02-12 MED ORDER — SPIRONOLACTONE 25 MG PO TABS
ORAL_TABLET | Freq: Every day | ORAL | 0 refills | Status: DC
Start: 2021-02-12 — End: 2021-04-01
  Filled 2021-02-12: qty 30, 30d supply, fill #0

## 2021-02-12 NOTE — Patient Instructions (Addendum)
Increase Torsemide to 60 mg daily  Increase Spironolactone to 50 mg Daily  Lab work done today we will call you with any abnormal results  Lab work follow up in 10 days  Your physician recommends that you schedule a follow-up appointment in: 6 weeks     You are scheduled for a Cardiac Catheterization on Wednesday, June 8 with Dr. Marca Ancona.  1. Please arrive at the [facility] (Main Entrance A) at [facility]: [address] [place], [place] [address] at 11:00 AM (This time is two hours before your procedure to ensure your preparation). Free valet parking service is available.   Special note: Every effort is made to have your procedure done on time. Please understand that emergencies sometimes delay scheduled procedures.  2. Diet: Do not eat solid foods after midnight.  The patient may have clear liquids until 5am upon the day of the procedure.  4. Medication instructions in preparation for your procedure:   Tuesday 6/7 DO NOT TAKE Xarelto  Wednesday 6/8 AM DO NOT TAKE: Xarelto, Torsemide, Spironolactone, or Farxiga  On the morning of your procedure, take any morning medicines NOT listed above.  You may use sips of water.  5. Plan for one night stay--bring personal belongings. 6. Bring a current list of your medications and current insurance cards. 7. You MUST have a responsible person to drive you home. 8. Someone MUST be with you the first 24 hours after you arrive home or your discharge will be delayed. 9. Please wear clothes that are easy to get on and off and wear slip-on shoes.  Thank you for allowing [place] to care for you!   --  Invasive Cardiovascular service    If you have any questions or concerns before your next appointment please send [place] a message through [facility] or call our office at [phone].    TO LEAVE A MESSAGE FOR THE NURSE SELECT OPTION 2, PLEASE LEAVE A MESSAGE INCLUDING: . YOUR NAME . DATE OF BIRTH . CALL BACK NUMBER . REASON  FOR CALL**this is important as we prioritize the call backs  YOU WILL RECEIVE A CALL BACK THE SAME DAY AS LONG AS YOU CALL BEFORE 4:00 PM  At the [facility], you and your health needs are our priority. As part of our continuing mission to provide you with exceptional heart care, we have created designated Provider Care Teams. These Care Teams include your primary Cardiologist (physician) and Advanced Practice Providers (APPs- Physician Assistants and Nurse Practitioners) who all work together to provide you with the care you need, when you need it.   You may see any of the following providers on your designated Care Team at your next follow up: [name] Dr Arvilla Meres . Dr Marca Ancona . Dr Thornell Mule . Tonye Becket, NP . Robbie Lis, PA . Shanda Bumps [place],NP . Karle Plumber, PharmD   Please be sure to bring in all your medications bottles to every appointment.

## 2021-02-13 LAB — ANA W/REFLEX: Anti Nuclear Antibody (ANA): NEGATIVE

## 2021-02-14 LAB — ANTI-SMOOTH MUSCLE ANTIBODY, IGG: F-Actin IgG: 14 Units (ref 0–19)

## 2021-02-14 NOTE — H&P (View-Only) (Signed)
[facility] Note   PCP: Dr. Jarold Song  HF Cardiology: Dr. Aundra Dubin   HPI: [name] is a 65 y.o. with a past medical history of chronic diastolic CHF, atrial fibrillation, DM. He was seen in Dr. Rosezella [place] office on 03/16/17 and set up for a right and left heart cath with concerns for type I pulmonary HTN.   He was admitted in 7/18 after right and left heart cath with elevated filling pressures and severe pulmonary HTN. Results below, predominantly pulmonary venous HTN with PVR 3.1 WU. Also with 75% stenosis in the distal LAD. He was started on IV diuresis and diuresed 34 pounds total. Transitioned to po torsemide 60 mg daily (had previously been on Lasix) at home. During his hospitalization he was noted to have transient, asymptomatic episodes of bradycardia. His metoprolol was reduced to 25 mg BID. Discharge weight was 276 pounds.   He was unfortunately lost to f/u in the [facility] from 2019 to 2022.   He was admitted by [facility] from 3/1-11/18/20 w/ acute hypoxic respiratory failure due to a/c diastolic heart failure w/ prominent RV dysfunction w/ massive anasarca/ ascites requiring paracentesis and IV Lasix. Also w/ viral pneumonitis  in the setting of acute COVID infection, treated w/ steroids and Remdesivir. 2D echo showed normal LVEF at 55%. RV noted to be severely enlarged w/ moderately reduced systolic function and severely elevated pulmonary artery systolic pressure, 78.5 mmHg w/ D-shaped  interventricular septum suggestive of RV pressure/volume overload.  V/Q scan was negative for PE. He was diuresed w/ IV Lasix down to 276 lb w/ significant symptomatic improvement. He was transitioned back to PO torsemide at 40 mg bid and instructed to f/u in the [facility]   He presents today for followup of CHF.  Weight is down 17 lbs.  Short of breath with stairs, no problems walking on flat ground. No ETOH x 10 years.  No orthopnea/PND.  No claudication.  No chest pain.  Able to go shopping and get around  stores with no problems. No lightheadedness.   ECG (personally reviewed): atrial fibrillation rate 49 with RBBB  Labs (3/22): K 3.9, creatinine 1.33  PMH: 1. Atrial fibrillation: Permanent.  2. CKD stage 3 3. Cirrhosis: Abdominal US (3/22) with cirrhosis.  4. Chronic diastolic CHF with prominent RV dysfunction: Echo in (3/22) with EF 55%, severely dilated RV with moderately decreased systolic function, PASP 74 mmHg, D-shaped septum, dilated IVC - LHC/RHC (7/18) with 75% distal LAD stenosis (small caliber distal vessel); mean RA 23, PA 94/35, mean 31, CI 3.09 - V/Q scan 3/22: No evidence for chronic PE.  - Echo (5/22): EF 65-70%, Mild LVH, moderately decreased RV systolic function, moderate biatrial enlargement, PASP 70 mmHg.  5. PFTs (2018): Moderate-severe obstruction.  6. CAD: LHC (7/18) with 75% distal LAD stenosis (small caliber distal vessel) 7. HTN   Current Outpatient Medications  Medication Sig Dispense Refill  . Accu-Chek Softclix Lancets lancets USE TO CHECK BLOOD SUGAR DAILY. 100 each 2  . albuterol (PROVENTIL HFA;VENTOLIN HFA) 108 (90 Base) MCG/ACT inhaler Inhale 2 puffs into the lungs every 6 (six) hours as needed for wheezing or shortness of breath. 1 Inhaler 0  . amLODipine (NORVASC) 10 MG tablet Take 1 tablet (10 mg total) by mouth daily. 30 tablet 2  . atorvastatin (LIPITOR) 20 MG tablet TAKE 1 TABLET (20 MG TOTAL) BY MOUTH DAILY. 30 tablet 6  . Blood Glucose Monitoring Suppl (ACCU-CHEK GUIDE ME) w/Device KIT USE TO CHECK BLOOD SUGAR DAILY. 1  kit 0  . cetirizine (ZYRTEC) 10 MG tablet Take 1 tablet (10 mg total) by mouth daily. 30 tablet 1  . dapagliflozin propanediol (FARXIGA) 10 MG TABS tablet Take by mouth daily before breakfast. 30 tablet 1  . glucose blood test strip USE TO CHECK BLOOD SUGAR DAILY. 100 strip 3  . Misc. Devices MISC Blood pressure monitor.  Diagnosis- hypertension 1 each 0  . pantoprazole (PROTONIX) 40 MG tablet TAKE 1 TABLET (40 MG TOTAL) BY MOUTH  DAILY. 30 tablet 3  . rivaroxaban (XARELTO) 20 MG TABS tablet TAKE 1 TABLET BY MOUTH DAILY WITH SUPPER. 90 tablet 1  . spironolactone (ALDACTONE) 25 MG tablet TAKE 1 TABLET (25 MG TOTAL) BY MOUTH DAILY. 30 tablet 0  . torsemide (DEMADEX) 20 MG tablet ALTERNATE 60 MG (3 TABS), AND 40 MG (2 TABS) DAILY 75 tablet 1   No current facility-administered medications for this encounter.   No Known Allergies  Social History   Socioeconomic History  . Marital status: Divorced    Spouse name: Not on file  . Number of children: 1  . Years of education: Not on file  . Highest education level: Not on file  Occupational History  . Not on file  Tobacco Use  . Smoking status: Never Smoker  . Smokeless tobacco: Never Used  Vaping Use  . Vaping Use: Never used  Substance and Sexual Activity  . Alcohol use: No    Comment: gave up drinking ~20 years ago  . Drug use: Yes    Types: Marijuana    Comment: daily  . Sexual activity: Not on file  Other Topics Concern  . Not on file  Social History Narrative   Lives alone    Social Determinants of Health   Financial Resource Strain: Not on file  Food Insecurity: Not on file  Transportation Needs: Not on file  Physical Activity: Not on file  Stress: Not on file  Social Connections: Not on file  Intimate Partner Violence: Not on file    Family History  Problem Relation Age of Onset  . Diabetes Mellitus II Mother        ESRD  . Diabetes Mellitus II Brother   . Emphysema Father    Vitals:   02/12/21 1504  BP: (!) 142/68  Pulse: 69  SpO2: 97%  Weight: 103.5 kg (228 lb 3.2 oz)  Height: _0  (1.854 m)   Wt Readings from Last 3 Encounters:  02/12/21 103.5 kg (228 lb 3.2 oz)  12/10/20 111.2 kg (245 lb 3.2 oz)  11/24/20 105.7 kg (233 lb)    PHYSICAL EXAM: General: NAD Neck: JVP 8-9 cm, no thyromegaly or thyroid nodule.  Lungs: Clear to auscultation bilaterally with normal respiratory effort. CV: Nondisplaced PMI.  Heart irregular  S1/S2, no S3/S4, no murmur.  1+ ankle edema.  No carotid bruit.  Normal pedal pulses.  Abdomen: Soft, nontender, no hepatosplenomegaly, mild distention.  Skin: Intact without lesions or rashes.  Neurologic: Alert and oriented x 3.  Psych: Normal affect. Extremities: No clubbing or cyanosis.  HEENT: Normal.    ASSESSMENT & PLAN:  1. Pulmonary hypertension: Primarily pulmonary venous hypertension, WHO Group 2, on RHC in 7/18.  No role at that time for selective pulmonary vasodilators. PFTs in 2018 c/w moderate-severe obstructive airway disease. V/Q scan negative in 3/22.  Significant RV dysfunction by echo, PASP estimate on 5/22 echo was 70 mmHg.   - He refuses sleep study.   - I will arrange for RHC  after further diuresis.  It is possible that he could have developed portopulmonary hypertension. We discussed risks/benefits and he agrees to procedure.  2. Chronic diastolic HF w/ Prominent RV Failure: Echo in 3/18 with LV EF 60-65%, RV severely dilated with moderate to severely decreased systolic function. Filling pressures markedly elevated on RHC in 2018. Admission in 3/22 with RV failure w/ marked fluid overload and anasarca/ ascites requiring paracentesis.  Most recent echo in 5/22 showed EF 65-70%, Mild LVH, moderately decreased RV systolic function, moderate biatrial enlargement, PASP 70 mmHg. Patient is at least mildly volume overloaded still on exam. - Increase torsemide to 60 mg very day.   - Increase spironolactone to 50 mg daily.  - BMET/BNP today, BMET 10 days.  3. CAD: 75% distal LAD lesion on 7/18 cath. Stable w/o CP  - Continue atorvastatin   - No ASA with need for Xarelto and stable CAD 4. Afib: Permanent. Good rate control.  - Continue Xarelto for anticoagulation.  5. Congenital Heart Disease:  Underwent surgical repair at age 22. He is not sure what type of congenital issue he had. 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  [facility] in 7/18 did not suggest  residual defect (no evidence for significant shunt).  6. HTN: controlled on current regimen.  7. CKD: Stage 3. Last SCr ~1.3 - check BMP today  8. Cirrhosis: Cause uncertain.  He is not drinking ETOH.  - Send viral hepatitis labs.  - As above, ?portopulmonary hypertension.   F/u 6 wks NP/PA.   Loralie Champagne 02/14/2021

## 2021-02-14 NOTE — Progress Notes (Signed)
[facility] Note   PCP: Dr. Jarold Song  HF Cardiology: Dr. Aundra Dubin   HPI: [name] is a 65 y.o. with a past medical history of chronic diastolic CHF, atrial fibrillation, DM. He was seen in Dr. Rosezella [place] office on 03/16/17 and set up for a right and left heart cath with concerns for type I pulmonary HTN.   He was admitted in 7/18 after right and left heart cath with elevated filling pressures and severe pulmonary HTN. Results below, predominantly pulmonary venous HTN with PVR 3.1 WU. Also with 75% stenosis in the distal LAD. He was started on IV diuresis and diuresed 34 pounds total. Transitioned to po torsemide 60 mg daily (had previously been on Lasix) at home. During his hospitalization he was noted to have transient, asymptomatic episodes of bradycardia. His metoprolol was reduced to 25 mg BID. Discharge weight was 276 pounds.   He was unfortunately lost to f/u in the [facility] from 2019 to 2022.   He was admitted by [facility] from 3/1-11/18/20 w/ acute hypoxic respiratory failure due to a/c diastolic heart failure w/ prominent RV dysfunction w/ massive anasarca/ ascites requiring paracentesis and IV Lasix. Also w/ viral pneumonitis  in the setting of acute COVID infection, treated w/ steroids and Remdesivir. 2D echo showed normal LVEF at 55%. RV noted to be severely enlarged w/ moderately reduced systolic function and severely elevated pulmonary artery systolic pressure, 78.5 mmHg w/ D-shaped  interventricular septum suggestive of RV pressure/volume overload.  V/Q scan was negative for PE. He was diuresed w/ IV Lasix down to 276 lb w/ significant symptomatic improvement. He was transitioned back to PO torsemide at 40 mg bid and instructed to f/u in the [facility]   He presents today for followup of CHF.  Weight is down 17 lbs.  Short of breath with stairs, no problems walking on flat ground. No ETOH x 10 years.  No orthopnea/PND.  No claudication.  No chest pain.  Able to go shopping and get around  stores with no problems. No lightheadedness.   ECG (personally reviewed): atrial fibrillation rate 49 with RBBB  Labs (3/22): K 3.9, creatinine 1.33  PMH: 1. Atrial fibrillation: Permanent.  2. CKD stage 3 3. Cirrhosis: Abdominal US (3/22) with cirrhosis.  4. Chronic diastolic CHF with prominent RV dysfunction: Echo in (3/22) with EF 55%, severely dilated RV with moderately decreased systolic function, PASP 74 mmHg, D-shaped septum, dilated IVC - LHC/RHC (7/18) with 75% distal LAD stenosis (small caliber distal vessel); mean RA 23, PA 94/35, mean 31, CI 3.09 - V/Q scan 3/22: No evidence for chronic PE.  - Echo (5/22): EF 65-70%, Mild LVH, moderately decreased RV systolic function, moderate biatrial enlargement, PASP 70 mmHg.  5. PFTs (2018): Moderate-severe obstruction.  6. CAD: LHC (7/18) with 75% distal LAD stenosis (small caliber distal vessel) 7. HTN   Current Outpatient Medications  Medication Sig Dispense Refill  . Accu-Chek Softclix Lancets lancets USE TO CHECK BLOOD SUGAR DAILY. 100 each 2  . albuterol (PROVENTIL HFA;VENTOLIN HFA) 108 (90 Base) MCG/ACT inhaler Inhale 2 puffs into the lungs every 6 (six) hours as needed for wheezing or shortness of breath. 1 Inhaler 0  . amLODipine (NORVASC) 10 MG tablet Take 1 tablet (10 mg total) by mouth daily. 30 tablet 2  . atorvastatin (LIPITOR) 20 MG tablet TAKE 1 TABLET (20 MG TOTAL) BY MOUTH DAILY. 30 tablet 6  . Blood Glucose Monitoring Suppl (ACCU-CHEK GUIDE ME) w/Device KIT USE TO CHECK BLOOD SUGAR DAILY. 1  kit 0  . cetirizine (ZYRTEC) 10 MG tablet Take 1 tablet (10 mg total) by mouth daily. 30 tablet 1  . dapagliflozin propanediol (FARXIGA) 10 MG TABS tablet Take by mouth daily before breakfast. 30 tablet 1  . glucose blood test strip USE TO CHECK BLOOD SUGAR DAILY. 100 strip 3  . Misc. Devices MISC Blood pressure monitor.  Diagnosis- hypertension 1 each 0  . pantoprazole (PROTONIX) 40 MG tablet TAKE 1 TABLET (40 MG TOTAL) BY MOUTH  DAILY. 30 tablet 3  . rivaroxaban (XARELTO) 20 MG TABS tablet TAKE 1 TABLET BY MOUTH DAILY WITH SUPPER. 90 tablet 1  . spironolactone (ALDACTONE) 25 MG tablet TAKE 1 TABLET (25 MG TOTAL) BY MOUTH DAILY. 30 tablet 0  . torsemide (DEMADEX) 20 MG tablet ALTERNATE 60 MG (3 TABS), AND 40 MG (2 TABS) DAILY 75 tablet 1   No current facility-administered medications for this encounter.   No Known Allergies  Social History   Socioeconomic History  . Marital status: Divorced    Spouse name: Not on file  . Number of children: 1  . Years of education: Not on file  . Highest education level: Not on file  Occupational History  . Not on file  Tobacco Use  . Smoking status: Never Smoker  . Smokeless tobacco: Never Used  Vaping Use  . Vaping Use: Never used  Substance and Sexual Activity  . Alcohol use: No    Comment: gave up drinking ~20 years ago  . Drug use: Yes    Types: Marijuana    Comment: daily  . Sexual activity: Not on file  Other Topics Concern  . Not on file  Social History Narrative   Lives alone    Social Determinants of Health   Financial Resource Strain: Not on file  Food Insecurity: Not on file  Transportation Needs: Not on file  Physical Activity: Not on file  Stress: Not on file  Social Connections: Not on file  Intimate Partner Violence: Not on file    Family History  Problem Relation Age of Onset  . Diabetes Mellitus II Mother        ESRD  . Diabetes Mellitus II Brother   . Emphysema Father    Vitals:   02/12/21 1504  BP: (!) 142/68  Pulse: 69  SpO2: 97%  Weight: 103.5 kg (228 lb 3.2 oz)  Height: _0  (1.854 m)   Wt Readings from Last 3 Encounters:  02/12/21 103.5 kg (228 lb 3.2 oz)  12/10/20 111.2 kg (245 lb 3.2 oz)  11/24/20 105.7 kg (233 lb)    PHYSICAL EXAM: General: NAD Neck: JVP 8-9 cm, no thyromegaly or thyroid nodule.  Lungs: Clear to auscultation bilaterally with normal respiratory effort. CV: Nondisplaced PMI.  Heart irregular  S1/S2, no S3/S4, no murmur.  1+ ankle edema.  No carotid bruit.  Normal pedal pulses.  Abdomen: Soft, nontender, no hepatosplenomegaly, mild distention.  Skin: Intact without lesions or rashes.  Neurologic: Alert and oriented x 3.  Psych: Normal affect. Extremities: No clubbing or cyanosis.  HEENT: Normal.    ASSESSMENT & PLAN:  1. Pulmonary hypertension: Primarily pulmonary venous hypertension, WHO Group 2, on RHC in 7/18.  No role at that time for selective pulmonary vasodilators. PFTs in 2018 c/w moderate-severe obstructive airway disease. V/Q scan negative in 3/22.  Significant RV dysfunction by echo, PASP estimate on 5/22 echo was 70 mmHg.   - He refuses sleep study.   - I will arrange for RHC  after further diuresis.  It is possible that he could have developed portopulmonary hypertension. We discussed risks/benefits and he agrees to procedure.  2. Chronic diastolic HF w/ Prominent RV Failure: Echo in 3/18 with LV EF 60-65%, RV severely dilated with moderate to severely decreased systolic function. Filling pressures markedly elevated on RHC in 2018. Admission in 3/22 with RV failure w/ marked fluid overload and anasarca/ ascites requiring paracentesis.  Most recent echo in 5/22 showed EF 65-70%, Mild LVH, moderately decreased RV systolic function, moderate biatrial enlargement, PASP 70 mmHg. Patient is at least mildly volume overloaded still on exam. - Increase torsemide to 60 mg very day.   - Increase spironolactone to 50 mg daily.  - BMET/BNP today, BMET 10 days.  3. CAD: 75% distal LAD lesion on 7/18 cath. Stable w/o CP  - Continue atorvastatin   - No ASA with need for Xarelto and stable CAD 4. Afib: Permanent. Good rate control.  - Continue Xarelto for anticoagulation.  5. Congenital Heart Disease:  Underwent surgical repair at age 22. He is not sure what type of congenital issue he had. 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  [facility] in 7/18 did not suggest  residual defect (no evidence for significant shunt).  6. HTN: controlled on current regimen.  7. CKD: Stage 3. Last SCr ~1.3 - check BMP today  8. Cirrhosis: Cause uncertain.  He is not drinking ETOH.  - Send viral hepatitis labs.  - As above, ?portopulmonary hypertension.   F/u 6 wks NP/PA.   Loralie Champagne 02/14/2021

## 2021-02-15 ENCOUNTER — Other Ambulatory Visit: Payer: Self-pay

## 2021-02-16 ENCOUNTER — Other Ambulatory Visit: Payer: Self-pay

## 2021-02-16 MED FILL — Pantoprazole Sodium EC Tab 40 MG (Base Equiv): ORAL | 30 days supply | Qty: 30 | Fill #2 | Status: AC

## 2021-02-17 ENCOUNTER — Other Ambulatory Visit (HOSPITAL_COMMUNITY): Payer: Self-pay

## 2021-02-17 ENCOUNTER — Encounter (HOSPITAL_COMMUNITY)
Admission: RE | Disposition: A | Discharge status: Home / Self Care | Payer: Self-pay | Source: Home / Self Care | Attending: Cardiology

## 2021-02-17 ENCOUNTER — Other Ambulatory Visit: Payer: Self-pay

## 2021-02-17 ENCOUNTER — Ambulatory Visit (HOSPITAL_COMMUNITY)
Admission: RE | Admit: 2021-02-17 | Discharge: 2021-02-17 | Disposition: A | Discharge status: Home / Self Care | Payer: Medicaid Other | Attending: Cardiology | Admitting: Cardiology

## 2021-02-17 ENCOUNTER — Other Ambulatory Visit (HOSPITAL_COMMUNITY): Payer: Self-pay | Admitting: *Deleted

## 2021-02-17 ENCOUNTER — Telehealth: Payer: Self-pay | Admitting: Family Medicine

## 2021-02-17 DIAGNOSIS — I130 Hypertensive heart and chronic kidney disease with heart failure and stage 1 through stage 4 chronic kidney disease, or unspecified chronic kidney disease: Secondary | ICD-10-CM | POA: Diagnosis not present

## 2021-02-17 DIAGNOSIS — I2510 Atherosclerotic heart disease of native coronary artery without angina pectoris: Secondary | ICD-10-CM | POA: Diagnosis not present

## 2021-02-17 DIAGNOSIS — Z7901 Long term (current) use of anticoagulants: Secondary | ICD-10-CM | POA: Diagnosis not present

## 2021-02-17 DIAGNOSIS — Z7984 Long term (current) use of oral hypoglycemic drugs: Secondary | ICD-10-CM | POA: Diagnosis not present

## 2021-02-17 DIAGNOSIS — K7460 Unspecified cirrhosis of liver: Secondary | ICD-10-CM | POA: Diagnosis not present

## 2021-02-17 DIAGNOSIS — E1122 Type 2 diabetes mellitus with diabetic chronic kidney disease: Secondary | ICD-10-CM | POA: Insufficient documentation

## 2021-02-17 DIAGNOSIS — Z79899 Other long term (current) drug therapy: Secondary | ICD-10-CM | POA: Insufficient documentation

## 2021-02-17 DIAGNOSIS — I272 Pulmonary hypertension, unspecified: Secondary | ICD-10-CM

## 2021-02-17 DIAGNOSIS — N183 Chronic kidney disease, stage 3 unspecified: Secondary | ICD-10-CM | POA: Diagnosis not present

## 2021-02-17 DIAGNOSIS — I5032 Chronic diastolic (congestive) heart failure: Secondary | ICD-10-CM | POA: Insufficient documentation

## 2021-02-17 DIAGNOSIS — I4821 Permanent atrial fibrillation: Secondary | ICD-10-CM | POA: Diagnosis not present

## 2021-02-17 HISTORY — PX: RIGHT HEART CATH: CATH118263

## 2021-02-17 LAB — POCT I-STAT EG7
Acid-Base Excess: 0 mmol/L (ref 0.0–2.0)
Acid-base deficit: 1 mmol/L (ref 0.0–2.0)
Bicarbonate: 23.6 mmol/L (ref 20.0–28.0)
Bicarbonate: 24.6 mmol/L (ref 20.0–28.0)
Calcium, Ion: 1.12 mmol/L — ABNORMAL LOW (ref 1.15–1.40)
Calcium, Ion: 1.17 mmol/L (ref 1.15–1.40)
HCT: 32 % — ABNORMAL LOW (ref 39.0–52.0)
HCT: 32 % — ABNORMAL LOW (ref 39.0–52.0)
Hemoglobin: 10.9 g/dL — ABNORMAL LOW (ref 13.0–17.0)
Hemoglobin: 10.9 g/dL — ABNORMAL LOW (ref 13.0–17.0)
O2 Saturation: 73 %
O2 Saturation: 74 %
Potassium: 4.2 mmol/L (ref 3.5–5.1)
Potassium: 4.4 mmol/L (ref 3.5–5.1)
Sodium: 135 mmol/L (ref 135–145)
Sodium: 137 mmol/L (ref 135–145)
TCO2: 25 mmol/L (ref 22–32)
TCO2: 26 mmol/L (ref 22–32)
pCO2, Ven: 36.4 mmHg — ABNORMAL LOW (ref 44.0–60.0)
pCO2, Ven: 37 mmHg — ABNORMAL LOW (ref 44.0–60.0)
pH, Ven: 7.42 (ref 7.250–7.430)
pH, Ven: 7.431 — ABNORMAL HIGH (ref 7.250–7.430)
pO2, Ven: 37 mmHg (ref 32.0–45.0)
pO2, Ven: 39 mmHg (ref 32.0–45.0)

## 2021-02-17 LAB — CBC
HCT: 34.9 % — ABNORMAL LOW (ref 39.0–52.0)
Hemoglobin: 11.3 g/dL — ABNORMAL LOW (ref 13.0–17.0)
MCH: 28.9 pg (ref 26.0–34.0)
MCHC: 32.4 g/dL (ref 30.0–36.0)
MCV: 89.3 fL (ref 80.0–100.0)
Platelets: 200 10*3/uL (ref 150–400)
RBC: 3.91 MIL/uL — ABNORMAL LOW (ref 4.22–5.81)
RDW: 15 % (ref 11.5–15.5)
WBC: 4.7 10*3/uL (ref 4.0–10.5)
nRBC: 0 % (ref 0.0–0.2)

## 2021-02-17 LAB — BASIC METABOLIC PANEL
Anion gap: 10 (ref 5–15)
BUN: 32 mg/dL — ABNORMAL HIGH (ref 8–23)
CO2: 25 mmol/L (ref 22–32)
Calcium: 9 mg/dL (ref 8.9–10.3)
Chloride: 97 mmol/L — ABNORMAL LOW (ref 98–111)
Creatinine, Ser: 1.56 mg/dL — ABNORMAL HIGH (ref 0.61–1.24)
GFR, Estimated: 49 mL/min — ABNORMAL LOW (ref >60)
Glucose, Bld: 141 mg/dL — ABNORMAL HIGH (ref 70–99)
Potassium: 4.3 mmol/L (ref 3.5–5.1)
Sodium: 132 mmol/L — ABNORMAL LOW (ref 135–145)

## 2021-02-17 LAB — GLUCOSE, CAPILLARY
Glucose-Capillary: 154 mg/dL — ABNORMAL HIGH (ref 70–99)
Glucose-Capillary: 131 mg/dL — ABNORMAL HIGH (ref 70–99)

## 2021-02-17 SURGERY — RIGHT HEART CATH
Anesthesia: LOCAL

## 2021-02-17 MED ORDER — ASPIRIN 81 MG PO CHEW
CHEWABLE_TABLET | ORAL | Status: AC
  Filled 2021-02-17: qty 1

## 2021-02-17 MED ORDER — HEPARIN (PORCINE) IN NACL 1000-0.9 UT/500ML-% IV SOLN
INTRAVENOUS | Status: DC | PRN
  Administered 2021-02-17: 500 mL

## 2021-02-17 MED ORDER — SODIUM CHLORIDE 0.9% FLUSH: 3.0000 mL | Freq: Two times a day (BID) | INTRAVENOUS | Status: DC

## 2021-02-17 MED ORDER — TADALAFIL (PAH) 20 MG PO TABS
20.0000 mg | ORAL_TABLET | Freq: Every day | ORAL | 3 refills | Status: DC
  Filled 2021-02-17: qty 30, 30d supply, fill #0

## 2021-02-17 MED ORDER — HEPARIN (PORCINE) IN NACL 1000-0.9 UT/500ML-% IV SOLN
INTRAVENOUS | Status: AC
  Filled 2021-02-17: qty 500

## 2021-02-17 MED ORDER — SODIUM CHLORIDE 0.9 % IV SOLN: INTRAVENOUS | Status: DC

## 2021-02-17 MED ORDER — SODIUM CHLORIDE 0.9% FLUSH: 3.0000 mL | INTRAVENOUS | Status: DC | PRN

## 2021-02-17 MED ORDER — LIDOCAINE HCL (PF) 1 % IJ SOLN
INTRAMUSCULAR | Status: DC | PRN
  Administered 2021-02-17: 2 mL via INTRADERMAL

## 2021-02-17 MED ORDER — ASPIRIN 81 MG PO CHEW
81.0000 mg | CHEWABLE_TABLET | ORAL | Status: DC
Start: 2021-02-17 — End: 2021-02-17

## 2021-02-17 MED ORDER — ACETAMINOPHEN 325 MG PO TABS: 650.0000 mg | ORAL_TABLET | ORAL | Status: DC | PRN

## 2021-02-17 MED ORDER — HYDRALAZINE HCL 20 MG/ML IJ SOLN: 10.0000 mg | INTRAMUSCULAR | Status: DC | PRN

## 2021-02-17 MED ORDER — SODIUM CHLORIDE 0.9 % IV SOLN: 250.0000 mL | INTRAVENOUS | Status: DC | PRN

## 2021-02-17 MED ORDER — LABETALOL HCL 5 MG/ML IV SOLN: 10.0000 mg | INTRAVENOUS | Status: DC | PRN

## 2021-02-17 MED ORDER — ONDANSETRON HCL 4 MG/2ML IJ SOLN: 4.0000 mg | Freq: Four times a day (QID) | INTRAMUSCULAR | Status: DC | PRN

## 2021-02-17 MED ORDER — LIDOCAINE HCL (PF) 1 % IJ SOLN
INTRAMUSCULAR | Status: AC
  Filled 2021-02-17: qty 30

## 2021-02-17 SURGICAL SUPPLY — 8 items
CATH BALLN WEDGE 5F 110CM (CATHETERS) ×1 IMPLANT
PACK CARDIAC CATHETERIZATION (CUSTOM PROCEDURE TRAY) ×2 IMPLANT
PROTECTION STATION PRESSURIZED (MISCELLANEOUS) ×2
SHEATH GLIDE SLENDER 4/5FR (SHEATH) ×1 IMPLANT
STATION PROTECTION PRESSURIZED (MISCELLANEOUS) IMPLANT
TRANSDUCER W/STOPCOCK (MISCELLANEOUS) ×2 IMPLANT
TUBING ART PRESS 72  MALE/FEM (TUBING) ×2
TUBING ART PRESS 72 MALE/FEM (TUBING) IMPLANT

## 2021-02-17 NOTE — Telephone Encounter (Signed)
Called patient and LVM advising him that I was calling from [facility] in regards to his upcoming appt for June 15th at 2:10pm. Unfortunately the provider is going to be out of the office and we need to get this appointment rescheduled.   Advised patient to call [phone] to reschedule.

## 2021-02-17 NOTE — Discharge Instructions (Signed)
Brachial Site Care  This sheet gives you information about how to care for yourself after your procedure. Your health care provider may also give you more specific instructions. If you have problems or questions, contact your health care provider. What can I expect after the procedure? After the procedure, it is common to have:  Bruising and tenderness at the catheter insertion area. Follow these instructions at home: Medicines  Take over-the-counter and prescription medicines only as told by your health care provider. Insertion site care 1. Follow instructions from your health care provider about how to take care of your insertion site. Make sure you: ? Wash your hands with soap and water before you change your bandage (dressing). If soap and water are not available, use hand sanitizer. ? Remove your dressing as told by your health care provider. In 24 hours 2. Check your insertion site every day for signs of infection. Check for: ? Redness, swelling, or pain. ? Fluid or blood. ? Pus or a bad smell. ? Warmth. 3. Do not take baths, swim, or use a hot tub until your health care provider approves. 4. You may shower 24-48 hours after the procedure, or as directed by your health care provider. ? Remove the dressing and gently wash the site with plain soap and water. ? Pat the area dry with a clean towel. ? Do not rub the site. That could cause bleeding. 5. Do not apply powder or lotion to the site. Activity   1. For 24 hours after the procedure, or as directed by your health care provider: ? Do not flex or bend the affected arm. ? Do not push or pull heavy objects with the affected arm. ? Do not drive yourself home from the hospital or clinic. You may drive 24 hours after the procedure unless your health care provider tells you not to. ? Do not operate machinery or power tools. 2. Do not lift anything that is heavier than 10 lb (4.5 kg), or the limit that you are told, until your  health care provider says that it is safe.  For 2 days 3. Ask your health care provider when it is okay to: ? Return to work or school. ? Resume usual physical activities or sports. ? Resume sexual activity. General instructions  If the catheter site starts to bleed, raise your arm and put firm pressure on the site. If the bleeding does not stop, get help right away.  If you went home on the same day as your procedure, a responsible adult should be with you for the first 24 hours after you arrive home.  Keep all follow-up visits as told by your health care provider. This is important. Contact a health care provider if:  You have a fever.  You have redness, swelling, or yellow drainage around your insertion site. Get help right away if:  You have unusual pain at the radial site.  The catheter insertion area swells very fast.  The insertion area is bleeding, and the bleeding does not stop when you hold steady pressure on the area.  Your arm or hand becomes pale, cool, tingly, or numb. These symptoms may represent a serious problem that is an emergency. Do not wait to see if the symptoms will go away. Get medical help right away. Call your local emergency services (911 in the [place] Do not drive yourself to the hospital. Summary  After the procedure, it is common to have bruising and tenderness at the site.  Follow  instructions from your health care provider about how to take care of your radial site wound. Check the wound every day for signs of infection.  Do not lift anything that is heavier than 10 lb (4.5 kg), or the limit that you are told, until your health care provider says that it is safe. This information is not intended to replace advice given to you by your health care provider. Make sure you discuss any questions you have with your health care provider. Document Revised: 10/04/2017 Document Reviewed: 10/04/2017 Elsevier Patient Education  2020 [employer].

## 2021-02-17 NOTE — Interval H&P Note (Signed)
History and Physical Interval Note:  02/17/2021 1:37 PM  Paul Mclaughlin  has presented today for surgery, with the diagnosis of pulmonary HTN.  The various methods of treatment have been discussed with the patient and family. After consideration of risks, benefits and other options for treatment, the patient has consented to  Procedure(s): RIGHT HEART CATH (N/A) as a surgical intervention.  The patient's history has been reviewed, patient examined, no change in status, stable for surgery.  I have reviewed the patient's chart and labs.  Questions were answered to the patient's satisfaction.     Deland Slocumb [employer]

## 2021-02-18 ENCOUNTER — Other Ambulatory Visit: Payer: Self-pay

## 2021-02-18 ENCOUNTER — Encounter (HOSPITAL_COMMUNITY): Payer: Self-pay | Admitting: Cardiology

## 2021-02-22 ENCOUNTER — Other Ambulatory Visit: Payer: Self-pay

## 2021-02-22 ENCOUNTER — Ambulatory Visit (HOSPITAL_COMMUNITY)
Admission: RE | Admit: 2021-02-22 | Discharge: 2021-02-22 | Disposition: A | Discharge status: Home / Self Care | Payer: Medicaid Other | Source: Ambulatory Visit | Attending: Internal Medicine | Admitting: Internal Medicine

## 2021-02-22 DIAGNOSIS — I5030 Unspecified diastolic (congestive) heart failure: Secondary | ICD-10-CM | POA: Insufficient documentation

## 2021-02-22 LAB — BASIC METABOLIC PANEL
Anion gap: 14 (ref 5–15)
BUN: 59 mg/dL — ABNORMAL HIGH (ref 8–23)
CO2: 21 mmol/L — ABNORMAL LOW (ref 22–32)
Calcium: 8.8 mg/dL — ABNORMAL LOW (ref 8.9–10.3)
Chloride: 91 mmol/L — ABNORMAL LOW (ref 98–111)
Creatinine, Ser: 2.8 mg/dL — ABNORMAL HIGH (ref 0.61–1.24)
GFR, Estimated: 24 mL/min — ABNORMAL LOW (ref >60)
Glucose, Bld: 157 mg/dL — ABNORMAL HIGH (ref 70–99)
Potassium: 3.9 mmol/L (ref 3.5–5.1)
Sodium: 126 mmol/L — ABNORMAL LOW (ref 135–145)

## 2021-02-24 ENCOUNTER — Ambulatory Visit: Payer: Medicaid Other | Admitting: Family Medicine

## 2021-03-01 ENCOUNTER — Other Ambulatory Visit (HOSPITAL_COMMUNITY): Payer: Self-pay

## 2021-03-01 ENCOUNTER — Other Ambulatory Visit: Payer: Self-pay

## 2021-03-02 ENCOUNTER — Other Ambulatory Visit: Payer: Self-pay

## 2021-03-04 ENCOUNTER — Telehealth (HOSPITAL_COMMUNITY): Payer: Self-pay

## 2021-03-04 NOTE — Telephone Encounter (Signed)
Lmtrc,letter mailed to address on file 

## 2021-03-04 NOTE — Telephone Encounter (Signed)
-----   Message from [name], MD sent at 02/22/2021  5:50 PM EDT ----- 1. Cut back on po fluid intake with low sodium . 2. Hold torsemide for 2 days then decrease back to 40 mg daily.  3. BMET 1 week.

## 2021-03-10 ENCOUNTER — Other Ambulatory Visit: Payer: Self-pay

## 2021-03-10 MED FILL — Atorvastatin Calcium Tab 20 MG (Base Equivalent): ORAL | 30 days supply | Qty: 30 | Fill #2 | Status: AC

## 2021-03-11 ENCOUNTER — Other Ambulatory Visit: Payer: Self-pay

## 2021-03-17 ENCOUNTER — Other Ambulatory Visit (HOSPITAL_COMMUNITY): Payer: Self-pay | Admitting: Cardiology

## 2021-03-17 ENCOUNTER — Ambulatory Visit (HOSPITAL_COMMUNITY)
Admission: RE | Admit: 2021-03-17 | Discharge: 2021-03-17 | Disposition: A | Discharge status: Home / Self Care | Payer: Medicaid Other | Source: Ambulatory Visit | Attending: Cardiology | Admitting: Cardiology

## 2021-03-17 ENCOUNTER — Other Ambulatory Visit: Payer: Self-pay

## 2021-03-17 DIAGNOSIS — I482 Chronic atrial fibrillation, unspecified: Secondary | ICD-10-CM

## 2021-03-17 LAB — BASIC METABOLIC PANEL
Anion gap: 11 (ref 5–15)
BUN: 38 mg/dL — ABNORMAL HIGH (ref 8–23)
CO2: 23 mmol/L (ref 22–32)
Calcium: 9.1 mg/dL (ref 8.9–10.3)
Chloride: 101 mmol/L (ref 98–111)
Creatinine, Ser: 1.81 mg/dL — ABNORMAL HIGH (ref 0.61–1.24)
GFR, Estimated: 41 mL/min — ABNORMAL LOW (ref >60)
Glucose, Bld: 169 mg/dL — ABNORMAL HIGH (ref 70–99)
Potassium: 4.3 mmol/L (ref 3.5–5.1)
Sodium: 135 mmol/L (ref 135–145)

## 2021-03-23 ENCOUNTER — Other Ambulatory Visit: Payer: Self-pay

## 2021-03-23 ENCOUNTER — Other Ambulatory Visit: Payer: Self-pay | Admitting: Family Medicine

## 2021-03-23 DIAGNOSIS — K219 Gastro-esophageal reflux disease without esophagitis: Secondary | ICD-10-CM

## 2021-03-23 MED ORDER — PANTOPRAZOLE SODIUM 40 MG PO TBEC
40.0000 mg | DELAYED_RELEASE_TABLET | Freq: Every day | ORAL | 0 refills | Status: DC
  Filled 2021-03-23: qty 30, 30d supply, fill #0

## 2021-03-23 MED ORDER — DAPAGLIFLOZIN PROPANEDIOL 10 MG PO TABS
10.0000 mg | ORAL_TABLET | Freq: Every day | ORAL | 0 refills | Status: DC
  Filled 2021-03-23: qty 30, 30d supply, fill #0

## 2021-03-23 NOTE — Telephone Encounter (Signed)
Notes to clinic:  Patient has upcoming appt on 04/07/2021 Review for refills   Requested Prescriptions  Pending Prescriptions Disp Refills   dapagliflozin propanediol (FARXIGA) 10 MG TABS tablet 30 tablet 1    Sig: Take by mouth daily before breakfast.      Endocrinology:  Diabetes - SGLT2 Inhibitors Failed - 03/23/2021  1:23 PM      Failed - Cr in normal range and within 360 days    Creat  Date Value Ref Range Status  11/29/2016 1.23 0.70 - 1.33 mg/dL Final    Comment:      For patients > or = 64 years of age: The upper reference limit for Creatinine is approximately 13% higher for people identified as African-American.      Creatinine, Ser  Date Value Ref Range Status  03/17/2021 1.81 (H) 0.61 - 1.24 mg/dL Final   Creatinine, Urine  Date Value Ref Range Status  11/29/2016 CANCELED 20 - 370 mg/dL     Comment:    Test not performed, no urine was received.    Result canceled by the ancillary           Failed - LDL in normal range and within 360 days    LDL Chol Calc (NIH)  Date Value Ref Range Status  12/31/2019 74 0 - 99 mg/dL Final          Failed - eGFR in normal range and within 360 days    GFR, Est African American  Date Value Ref Range Status  11/29/2016 74 >=60 mL/min Final   GFR calc Af Amer  Date Value Ref Range Status  05/13/2020 74 >59 mL/min/1.73 Final    Comment:    **[facility] currently reports eGFR in compliance with the current**   recommendations of the [employer]. [facility] will   update reporting as new guidelines are published from the NKF-ASN   Task force.    GFR, Est Non African American  Date Value Ref Range Status  11/29/2016 64 >=60 mL/min Final   GFR, Estimated  Date Value Ref Range Status  03/17/2021 41 (L) >60 mL/min Final    Comment:    (NOTE) Calculated using the CKD-EPI Creatinine Equation (2021)           Passed - HBA1C is between 0 and 7.9 and within 180 days    Hemoglobin A1C  Date Value Ref  Range Status  11/10/2020 5.3 4.0 - 5.6 % Final   HbA1c, POC (controlled diabetic range)  Date Value Ref Range Status  05/13/2020 5.5 0.0 - 7.0 % Final          Passed - Valid encounter within last 6 months    Recent Outpatient Visits           3 months ago Cirrhosis of liver with ascites, unspecified hepatic cirrhosis type [facility])   [place], [place], MD   4 months ago Type 2 diabetes mellitus with complication, without long-term current use of insulin ([place])   [place], [place], MD   10 months ago Type 2 diabetes mellitus with complication, without long-term current use of insulin ([place])   [place], [place], MD   1 year ago Type 2 diabetes mellitus with complication, without long-term current use of insulin ([place])   [place], Charlane Ferretti, MD   2 years ago Type 2 diabetes mellitus with complication, without  long-term current use of insulin ([place])   [place], Enobong, MD       Future Appointments             In 2 weeks Charlott Rakes, MD [place]               pantoprazole (PROTONIX) 40 MG tablet 30 tablet 3    Sig: Take by mouth daily.      Gastroenterology: Proton Pump Inhibitors Passed - 03/23/2021  1:23 PM      Passed - Valid encounter within last 12 months    Recent Outpatient Visits           3 months ago Cirrhosis of liver with ascites, unspecified hepatic cirrhosis type [facility])   [place], [place], MD   4 months ago Type 2 diabetes mellitus with complication, without long-term current use of insulin ([place])   [place], [place], MD   10 months ago Type 2 diabetes mellitus with complication, without long-term current use of insulin ([place])   [place], Charlane Ferretti, MD   1 year ago Type 2 diabetes mellitus with complication, without long-term current use of insulin ([place])   [place], Charlane Ferretti, MD   2 years ago Type 2 diabetes mellitus with complication, without long-term current use of insulin ([place])   [place], Enobong, MD       Future Appointments             In 2 weeks Charlott Rakes, MD [place]

## 2021-03-24 ENCOUNTER — Other Ambulatory Visit: Payer: Self-pay

## 2021-03-30 ENCOUNTER — Other Ambulatory Visit: Payer: Self-pay

## 2021-03-30 ENCOUNTER — Ambulatory Visit (HOSPITAL_COMMUNITY)
Admission: RE | Admit: 2021-03-30 | Discharge: 2021-03-30 | Disposition: A | Discharge status: Home / Self Care | Payer: Medicaid Other | Source: Ambulatory Visit | Attending: Cardiology | Admitting: Cardiology

## 2021-03-30 ENCOUNTER — Encounter (HOSPITAL_COMMUNITY): Payer: Self-pay

## 2021-03-30 VITALS — BP 122/69 | HR 70 | Wt 233.0 lb

## 2021-03-30 DIAGNOSIS — I5032 Chronic diastolic (congestive) heart failure: Secondary | ICD-10-CM | POA: Diagnosis not present

## 2021-03-30 DIAGNOSIS — I130 Hypertensive heart and chronic kidney disease with heart failure and stage 1 through stage 4 chronic kidney disease, or unspecified chronic kidney disease: Secondary | ICD-10-CM | POA: Diagnosis not present

## 2021-03-30 DIAGNOSIS — I4821 Permanent atrial fibrillation: Secondary | ICD-10-CM | POA: Diagnosis not present

## 2021-03-30 DIAGNOSIS — I272 Pulmonary hypertension, unspecified: Secondary | ICD-10-CM

## 2021-03-30 DIAGNOSIS — N183 Chronic kidney disease, stage 3 unspecified: Secondary | ICD-10-CM | POA: Diagnosis not present

## 2021-03-30 DIAGNOSIS — E1122 Type 2 diabetes mellitus with diabetic chronic kidney disease: Secondary | ICD-10-CM | POA: Diagnosis not present

## 2021-03-30 DIAGNOSIS — Z7984 Long term (current) use of oral hypoglycemic drugs: Secondary | ICD-10-CM | POA: Insufficient documentation

## 2021-03-30 DIAGNOSIS — Z79899 Other long term (current) drug therapy: Secondary | ICD-10-CM | POA: Diagnosis not present

## 2021-03-30 DIAGNOSIS — Z7901 Long term (current) use of anticoagulants: Secondary | ICD-10-CM | POA: Diagnosis not present

## 2021-03-30 DIAGNOSIS — K7460 Unspecified cirrhosis of liver: Secondary | ICD-10-CM | POA: Diagnosis not present

## 2021-03-30 DIAGNOSIS — I2510 Atherosclerotic heart disease of native coronary artery without angina pectoris: Secondary | ICD-10-CM | POA: Diagnosis not present

## 2021-03-30 LAB — BASIC METABOLIC PANEL
Anion gap: 7 (ref 5–15)
BUN: 34 mg/dL — ABNORMAL HIGH (ref 8–23)
CO2: 25 mmol/L (ref 22–32)
Calcium: 9.4 mg/dL (ref 8.9–10.3)
Chloride: 101 mmol/L (ref 98–111)
Creatinine, Ser: 1.36 mg/dL — ABNORMAL HIGH (ref 0.61–1.24)
GFR, Estimated: 58 mL/min — ABNORMAL LOW (ref >60)
Glucose, Bld: 174 mg/dL — ABNORMAL HIGH (ref 70–99)
Potassium: 4.7 mmol/L (ref 3.5–5.1)
Sodium: 133 mmol/L — ABNORMAL LOW (ref 135–145)

## 2021-03-30 NOTE — Progress Notes (Signed)
Advanced Heart Failure Clinic Note   PCP: Dr. Jarold Song  HF Cardiology: Dr. Aundra Dubin   HPI: [name] is a 65 y.o. with a past medical history of chronic diastolic CHF, atrial fibrillation, DM. He was seen in Dr. Rosezella [place] office on 03/16/17 and set up for a right and left heart cath with concerns for type I pulmonary HTN.   He was admitted in 7/18 after right and left heart cath with elevated filling pressures and severe pulmonary HTN. Results below, predominantly pulmonary venous HTN with PVR 3.1 WU. Also with 75% stenosis in the distal LAD. He was started on IV diuresis and diuresed 34 pounds total. Transitioned to po torsemide 60 mg daily (had previously been on Lasix) at home. During his hospitalization he was noted to have transient, asymptomatic episodes of bradycardia. His metoprolol was reduced to 25 mg BID. Discharge weight was 276 pounds.   He was unfortunately lost to f/u in the [facility] from 2019 to 2022.   He was admitted by [facility] from 3/1-11/18/20 w/ acute hypoxic respiratory failure due to a/c diastolic heart failure w/ prominent RV dysfunction w/ massive anasarca/ ascites requiring paracentesis and IV Lasix. Also w/ viral pneumonitis  in the setting of acute COVID infection, treated w/ steroids and Remdesivir. 2D echo showed normal LVEF at 55%. RV noted to be severely enlarged w/ moderately reduced systolic function and severely elevated pulmonary artery systolic pressure, 61.4 mmHg w/ D-shaped  interventricular septum suggestive of RV pressure/volume overload.  V/Q scan was negative for PE. He was diuresed w/ IV Lasix down to 276 lb w/ significant symptomatic improvement. He was transitioned back to PO torsemide at 40 mg bid and instructed to f/u in the [facility].   Had f/u w/ Dr. Aundra Dubin 6/20 and torsemide further increased to 60 mg daily. Also referred for RHC. This showed normal filling pressures and moderate PH w/ with PVR 3.1 WU. 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 He was placed on a trial of Tadalafil but unfortunately did not tolerate due to severe HAs and self discontinued. Also of note, hepatis panel was negative.   He returns to clinic today for f/u. Very nice but adamant about what he will and will not do. He refuses sleep study. Has only been taking 40 of torsemide daily instead of 60. He thinks he "pees enough" with just 2 tablets, though he will take an extra tablet if he feels that he needs it. Also refuses retrial of Tadalafil. He reports that he is currently happy w/ his QOL and is able to do the activities that he desires w/o significant limitation, NYHA Class II. Does not desire to change current treatment plan.   RHC 6/22  Right Heart Pressures RHC Procedural Findings: Hemodynamics (mmHg) RA mean 5 RV 57/5 PA 62/23, mean 36 PCWP mean 13  Oxygen saturations: PA 73% AO 99%  Cardiac Output (Fick) 7.4  Cardiac Index (Fick) 3.3 PVR 3.1 WU     ECG: not performed   Labs (3/22): K 3.9, creatinine 1.33 Labs (6/22): Scr 1.56, K 4.3 Labs (6/22): SCr 2.80, K 3.9  Labs (7/22): SCr 1.81, K 4.3   PMH: 1. Atrial fibrillation: Permanent.  2. CKD stage 3 3. Cirrhosis: Abdominal US (3/22) with cirrhosis.  4. Chronic diastolic CHF with prominent RV dysfunction: Echo in (3/22) with EF 55%, severely dilated RV with moderately decreased systolic  function, PASP 74 mmHg, D-shaped septum, dilated IVC - LHC/RHC (7/18) with 75% distal LAD stenosis (small caliber distal vessel); mean RA 23, PA 94/35, mean 31, CI 3.09 - V/Q scan 3/22: No evidence for chronic PE.  - Echo (5/22): EF 65-70%, Mild LVH, moderately decreased RV systolic function, moderate biatrial enlargement, PASP 70 mmHg.  5. PFTs (2018): Moderate-severe obstruction.  6. CAD: LHC (7/18) with 75% distal LAD stenosis (small caliber distal vessel) 7. HTN   Current Outpatient Medications   Medication Sig Dispense Refill   Accu-Chek Softclix Lancets lancets USE TO CHECK BLOOD SUGAR DAILY. (Patient taking differently: USE TO CHECK BLOOD SUGAR DAILY.) 100 each 2   albuterol (PROVENTIL HFA;VENTOLIN HFA) 108 (90 Base) MCG/ACT inhaler Inhale 2 puffs into the lungs every 6 (six) hours as needed for wheezing or shortness of breath. 1 Inhaler 0   amLODipine (NORVASC) 10 MG tablet Take 1 tablet (10 mg total) by mouth daily. 30 tablet 2   atorvastatin (LIPITOR) 20 MG tablet TAKE 1 TABLET (20 MG TOTAL) BY MOUTH DAILY. 30 tablet 6   Blood Glucose Monitoring Suppl (ACCU-CHEK GUIDE ME) w/Device KIT USE TO CHECK BLOOD SUGAR DAILY. 1 kit 0   dapagliflozin propanediol (FARXIGA) 10 MG TABS tablet Take 1 tablet (10 mg total) by mouth daily before breakfast. 30 tablet 0   glucose blood test strip USE TO CHECK BLOOD SUGAR DAILY. (Patient taking differently: USE TO CHECK BLOOD SUGAR DAILY.) 100 strip 3   Misc. Devices MISC Blood pressure monitor.  Diagnosis- hypertension 1 each 0   rivaroxaban (XARELTO) 20 MG TABS tablet TAKE 1 TABLET BY MOUTH DAILY WITH SUPPER. 90 tablet 1   spironolactone (ALDACTONE) 25 MG tablet TAKE 1 TABLET (25 MG TOTAL) BY MOUTH DAILY. 30 tablet 0   torsemide (DEMADEX) 20 MG tablet Take 20 mg by mouth 2 (two) times daily.     No current facility-administered medications for this encounter.   Allergies  Allergen Reactions   Tadalafil Other (See Comments)    Patient experiences a head ache with this medication.    Social History   Socioeconomic History   Marital status: Divorced    Spouse name: Not on file   Number of children: 1   Years of education: Not on file   Highest education level: Not on file  Occupational History   Not on file  Tobacco Use   Smoking status: Never   Smokeless tobacco: Never  Vaping Use   Vaping Use: Never used  Substance and Sexual Activity   Alcohol use: No    Comment: gave up drinking ~20 years ago   Drug use: Yes    Types: Marijuana     Comment: daily   Sexual activity: Not on file  Other Topics Concern   Not on file  Social History Narrative   Lives alone    Social Determinants of Health   Financial Resource Strain: Not on file  Food Insecurity: Not on file  Transportation Needs: Not on file  Physical Activity: Not on file  Stress: Not on file  Social Connections: Not on file  Intimate Partner Violence: Not on file    Family History  Problem Relation Age of Onset   Diabetes Mellitus II Mother        ESRD   Diabetes Mellitus II Brother    Emphysema Father    Vitals:   03/30/21 1444  BP: 122/69  Pulse: 70  SpO2: 97%  Weight: 105.7 kg (233 lb)   Wt  Readings from Last 3 Encounters:  03/30/21 105.7 kg (233 lb)  02/17/21 100.2 kg (221 lb)  02/12/21 103.5 kg (228 lb 3.2 oz)    PHYSICAL EXAM: General:  Well appearing. No respiratory difficulty HEENT: normal Neck: supple. JVD 8 cm. Carotids 2+ bilat; no bruits. No lymphadenopathy or thyromegaly appreciated. Cor: PMI nondisplaced. Regular rate & rhythm. No rubs, gallops or murmurs. Lungs: clear Abdomen: obese, nontender, mildly distended. + hepatosplenomegaly. No bruits or masses. Good bowel sounds. Extremities: no cyanosis, clubbing, rash, edema Neuro: alert & oriented x 3, cranial nerves grossly intact. moves all 4 extremities w/o difficulty. Affect pleasant.    ASSESSMENT & PLAN:  1. Pulmonary hypertension: Primarily pulmonary venous hypertension, WHO Group 2, on RHC in 7/18.  No role at that time for selective pulmonary vasodilators. PFTs in 2018 c/w moderate-severe obstructive airway disease. V/Q scan negative in 3/22.  Significant RV dysfunction by echo, PASP estimate on 5/22 echo was 70 mmHg.  RHC 6/22: Normal filling pressure. Moderate pulmonary hypertension with PVR 3.1 WU. 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 He was placed on trial of tadalafil but did not ut unfortunately did not tolerate due to severe HAs and self discontinued. Hepatitis panel negative - refuses retrial of tadalafil  - refuses sleep study  - volume status currently ok, NYHA Class II - continue torsemide 40 mg daily, he will increase to 60 mg if wt gain > 3 lb in 24 hr or > 5 lb in 1 week  2. Chronic diastolic HF w/ Prominent RV Failure: Echo in 3/18 with LV EF 60-65%, RV severely dilated with moderate to severely decreased systolic function. Filling pressures markedly elevated on RHC in 2018. Admission in 3/22 with RV failure w/ marked fluid overload and anasarca/ ascites requiring paracentesis.  Most recent echo in 5/22 showed EF 65-70%, Mild LVH, moderately decreased RV systolic function, moderate biatrial enlargement, PASP 70 mmHg. Recent RHC per above.  - NYHA Class II, volume status ok  - continue torsemide 40 mg daily, he will increase to 60 mg if wt gain > 3 lb in 24 hr or > 5 lb in 1 week  - continue spironolactone 50 mg daily.  - discussed daily wts, fluid restriction and low sodium diet  - Check BMP today  3. CAD: 75% distal LAD lesion on 7/18 cath. Stable. Denies chest pain  - Continue atorvastatin   - No ASA with need for Xarelto and stable CAD 4. Afib: Permanent. Good rate control. HR 70s.  - Continue Xarelto for anticoagulation.  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 He is not sure what type of congenital issue he had. 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  [place] in 7/18 did not suggest residual defect (no evidence for significant shunt).  6. HTN: controlled on current regimen. 122/69  7. CKD: Stage 3. Last SCr ~1.8 - check BMP today  8. Cirrhosis: Cause uncertain.  He is not drinking ETOH.  - Viral hepatitis panel negative  - As above, ?portopulmonary hypertension. He refuses to restart Tadalafill  F/u in 2-3 months w/ Dr. Aundra Dubin.   Lyda Jester, PA-C  03/30/2021

## 2021-03-30 NOTE — Patient Instructions (Signed)
Continue current medications  Labs done today, we will call you for abnormal results  Your physician recommends that you schedule a follow-up appointment in: 3 months with Dr Shirlee Latch  If you have any questions or concerns before your next appointment please send [place] a message through [place] or call our office at [phone].    TO LEAVE A MESSAGE FOR THE NURSE SELECT OPTION 2, PLEASE LEAVE A MESSAGE INCLUDING: YOUR NAME DATE OF BIRTH CALL BACK NUMBER REASON FOR CALL**this is important as we prioritize the call backs  YOU WILL RECEIVE A CALL BACK THE SAME DAY AS LONG AS YOU CALL BEFORE 4:00 PM  milAt the [facility], you and your health needs are our priority. As part of our continuing mission to provide you with exceptional heart care, we have created designated Provider Care Teams. These Care Teams include your primary Cardiologist (physician) and Advanced Practice Providers (APPs- Physician Assistants and Nurse Practitioners) who all work together to provide you with the care you need, when you need it.   You may see any of the following providers on your designated Care Team at your next follow up: Dr Arvilla Meres Dr Marca Ancona Dr Brandon Melnick, NP Robbie Lis, [place] Mikki Santee Karle Plumber, PharmD   Please be sure to bring in all your medications bottles to every appointment.

## 2021-04-01 ENCOUNTER — Other Ambulatory Visit: Payer: Self-pay | Admitting: Family Medicine

## 2021-04-01 ENCOUNTER — Other Ambulatory Visit (HOSPITAL_COMMUNITY): Payer: Self-pay | Admitting: Cardiology

## 2021-04-01 ENCOUNTER — Other Ambulatory Visit: Payer: Self-pay

## 2021-04-01 MED FILL — Atorvastatin Calcium Tab 20 MG (Base Equivalent): ORAL | 30 days supply | Qty: 30 | Fill #3 | Status: CN

## 2021-04-01 NOTE — Telephone Encounter (Signed)
   Notes to clinic Medication is still on med list but has an end date of 04/01/21, please assess.

## 2021-04-02 ENCOUNTER — Other Ambulatory Visit: Payer: Self-pay

## 2021-04-02 MED ORDER — AMLODIPINE BESYLATE 10 MG PO TABS
10.0000 mg | ORAL_TABLET | Freq: Every day | ORAL | 1 refills | Status: DC
  Filled 2021-04-02: qty 30, 30d supply, fill #0
  Filled 2021-05-04: qty 30, 30d supply, fill #1

## 2021-04-05 ENCOUNTER — Other Ambulatory Visit: Payer: Self-pay

## 2021-04-05 MED ORDER — SPIRONOLACTONE 25 MG PO TABS
ORAL_TABLET | Freq: Every day | ORAL | 11 refills | Status: DC
  Filled 2021-04-05: qty 30, 30d supply, fill #0
  Filled 2021-05-04: qty 30, 30d supply, fill #1
  Filled 2021-06-07: qty 30, 30d supply, fill #2
  Filled 2021-07-07: qty 30, 30d supply, fill #3
  Filled 2021-08-06: qty 30, 30d supply, fill #4
  Filled 2021-09-02: qty 30, 30d supply, fill #5
  Filled 2021-10-06: qty 30, 30d supply, fill #6
  Filled 2021-10-06: qty 30, 30d supply, fill #0
  Filled 2021-11-08: qty 30, 30d supply, fill #1
  Filled 2021-12-06: qty 30, 30d supply, fill #2
  Filled 2022-01-06: qty 30, 30d supply, fill #3
  Filled 2022-02-01: qty 30, 30d supply, fill #4
  Filled 2022-03-07: qty 30, 30d supply, fill #5

## 2021-04-05 MED FILL — Atorvastatin Calcium Tab 20 MG (Base Equivalent): ORAL | 30 days supply | Qty: 30 | Fill #3 | Status: AC

## 2021-04-06 ENCOUNTER — Other Ambulatory Visit: Payer: Self-pay

## 2021-04-06 ENCOUNTER — Other Ambulatory Visit (HOSPITAL_COMMUNITY): Payer: Self-pay

## 2021-04-06 ENCOUNTER — Other Ambulatory Visit (HOSPITAL_COMMUNITY): Payer: Self-pay | Admitting: *Deleted

## 2021-04-06 MED ORDER — TORSEMIDE 20 MG PO TABS
20.0000 mg | ORAL_TABLET | Freq: Two times a day (BID) | ORAL | 3 refills | Status: DC
  Filled 2021-04-06: qty 60, 30d supply, fill #0
  Filled 2021-05-07: qty 60, 30d supply, fill #1
  Filled 2021-06-16: qty 60, 30d supply, fill #2
  Filled 2021-07-26: qty 60, 30d supply, fill #3

## 2021-04-07 ENCOUNTER — Ambulatory Visit: Payer: Medicaid Other | Admitting: Family Medicine

## 2021-04-26 ENCOUNTER — Other Ambulatory Visit: Payer: Self-pay | Admitting: Family Medicine

## 2021-04-27 ENCOUNTER — Other Ambulatory Visit: Payer: Self-pay

## 2021-04-27 MED ORDER — DAPAGLIFLOZIN PROPANEDIOL 10 MG PO TABS
10.0000 mg | ORAL_TABLET | Freq: Every day | ORAL | 1 refills | Status: DC
  Filled 2021-04-27: qty 30, 30d supply, fill #0
  Filled 2021-05-26: qty 30, 30d supply, fill #1

## 2021-04-28 ENCOUNTER — Other Ambulatory Visit: Payer: Self-pay

## 2021-05-05 ENCOUNTER — Other Ambulatory Visit: Payer: Self-pay

## 2021-05-07 ENCOUNTER — Other Ambulatory Visit: Payer: Self-pay

## 2021-05-07 MED FILL — Atorvastatin Calcium Tab 20 MG (Base Equivalent): ORAL | 30 days supply | Qty: 30 | Fill #4 | Status: AC

## 2021-05-18 ENCOUNTER — Other Ambulatory Visit: Payer: Self-pay

## 2021-05-18 MED FILL — Rivaroxaban Tab 20 MG: ORAL | 90 days supply | Qty: 90 | Fill #1 | Status: AC

## 2021-05-27 ENCOUNTER — Other Ambulatory Visit: Payer: Self-pay

## 2021-06-07 ENCOUNTER — Other Ambulatory Visit: Payer: Self-pay

## 2021-06-07 ENCOUNTER — Other Ambulatory Visit: Payer: Self-pay | Admitting: Family Medicine

## 2021-06-07 MED ORDER — AMLODIPINE BESYLATE 10 MG PO TABS
10.0000 mg | ORAL_TABLET | Freq: Every day | ORAL | 0 refills | Status: DC
  Filled 2021-06-07: qty 30, 30d supply, fill #0

## 2021-06-07 MED FILL — Atorvastatin Calcium Tab 20 MG (Base Equivalent): ORAL | 30 days supply | Qty: 30 | Fill #5 | Status: AC

## 2021-06-08 ENCOUNTER — Other Ambulatory Visit: Payer: Self-pay

## 2021-06-16 ENCOUNTER — Other Ambulatory Visit: Payer: Self-pay

## 2021-06-21 ENCOUNTER — Other Ambulatory Visit: Payer: Self-pay

## 2021-06-28 ENCOUNTER — Encounter (HOSPITAL_COMMUNITY): Payer: Medicaid Other | Admitting: Cardiology

## 2021-06-28 ENCOUNTER — Other Ambulatory Visit: Payer: Self-pay | Admitting: Family Medicine

## 2021-06-28 ENCOUNTER — Other Ambulatory Visit: Payer: Self-pay

## 2021-06-29 ENCOUNTER — Encounter: Payer: Self-pay | Admitting: Family Medicine

## 2021-06-29 ENCOUNTER — Other Ambulatory Visit: Payer: Self-pay

## 2021-06-29 ENCOUNTER — Ambulatory Visit: Payer: Medicaid Other | Attending: Family Medicine | Admitting: Family Medicine

## 2021-06-29 VITALS — BP 129/68 | HR 60 | Ht 73.0 in | Wt 226.0 lb

## 2021-06-29 DIAGNOSIS — I152 Hypertension secondary to endocrine disorders: Secondary | ICD-10-CM | POA: Diagnosis not present

## 2021-06-29 DIAGNOSIS — E1169 Type 2 diabetes mellitus with other specified complication: Secondary | ICD-10-CM | POA: Diagnosis not present

## 2021-06-29 DIAGNOSIS — Z833 Family history of diabetes mellitus: Secondary | ICD-10-CM | POA: Insufficient documentation

## 2021-06-29 DIAGNOSIS — E1159 Type 2 diabetes mellitus with other circulatory complications: Secondary | ICD-10-CM | POA: Diagnosis not present

## 2021-06-29 DIAGNOSIS — Z79899 Other long term (current) drug therapy: Secondary | ICD-10-CM | POA: Diagnosis not present

## 2021-06-29 DIAGNOSIS — Z7984 Long term (current) use of oral hypoglycemic drugs: Secondary | ICD-10-CM | POA: Insufficient documentation

## 2021-06-29 DIAGNOSIS — I272 Pulmonary hypertension, unspecified: Secondary | ICD-10-CM | POA: Insufficient documentation

## 2021-06-29 DIAGNOSIS — I5042 Chronic combined systolic (congestive) and diastolic (congestive) heart failure: Secondary | ICD-10-CM | POA: Diagnosis not present

## 2021-06-29 DIAGNOSIS — Z8616 Personal history of COVID-19: Secondary | ICD-10-CM | POA: Diagnosis not present

## 2021-06-29 DIAGNOSIS — I480 Paroxysmal atrial fibrillation: Secondary | ICD-10-CM | POA: Diagnosis not present

## 2021-06-29 DIAGNOSIS — R1905 Periumbilic swelling, mass or lump: Secondary | ICD-10-CM

## 2021-06-29 DIAGNOSIS — Z888 Allergy status to other drugs, medicaments and biological substances status: Secondary | ICD-10-CM | POA: Insufficient documentation

## 2021-06-29 DIAGNOSIS — Z713 Dietary counseling and surveillance: Secondary | ICD-10-CM | POA: Diagnosis not present

## 2021-06-29 DIAGNOSIS — Z89422 Acquired absence of other left toe(s): Secondary | ICD-10-CM | POA: Diagnosis not present

## 2021-06-29 DIAGNOSIS — E118 Type 2 diabetes mellitus with unspecified complications: Secondary | ICD-10-CM | POA: Diagnosis not present

## 2021-06-29 DIAGNOSIS — I110 Hypertensive heart disease with heart failure: Secondary | ICD-10-CM

## 2021-06-29 DIAGNOSIS — Z7182 Exercise counseling: Secondary | ICD-10-CM | POA: Insufficient documentation

## 2021-06-29 DIAGNOSIS — Z7901 Long term (current) use of anticoagulants: Secondary | ICD-10-CM | POA: Diagnosis not present

## 2021-06-29 DIAGNOSIS — E1151 Type 2 diabetes mellitus with diabetic peripheral angiopathy without gangrene: Secondary | ICD-10-CM | POA: Diagnosis present

## 2021-06-29 DIAGNOSIS — K7460 Unspecified cirrhosis of liver: Secondary | ICD-10-CM | POA: Diagnosis not present

## 2021-06-29 DIAGNOSIS — Z1211 Encounter for screening for malignant neoplasm of colon: Secondary | ICD-10-CM

## 2021-06-29 LAB — GLUCOSE, POCT (MANUAL RESULT ENTRY): POC Glucose: 200 mg/dl — AB (ref 70–99)

## 2021-06-29 MED ORDER — AMLODIPINE BESYLATE 10 MG PO TABS
10.0000 mg | ORAL_TABLET | Freq: Every day | ORAL | 6 refills | Status: DC
  Filled 2021-06-29 – 2021-07-07 (×2): qty 30, 30d supply, fill #0
  Filled 2021-08-06: qty 30, 30d supply, fill #1
  Filled 2021-09-06: qty 30, 30d supply, fill #2
  Filled 2021-10-06: qty 30, 30d supply, fill #3
  Filled 2021-10-06: qty 30, 30d supply, fill #0
  Filled 2021-11-08: qty 30, 30d supply, fill #1
  Filled 2021-12-08: qty 30, 30d supply, fill #2
  Filled 2022-01-06: qty 30, 30d supply, fill #3

## 2021-06-29 MED ORDER — GLIPIZIDE 5 MG PO TABS
2.5000 mg | ORAL_TABLET | Freq: Two times a day (BID) | ORAL | 6 refills | Status: DC
  Filled 2021-06-29: qty 30, 30d supply, fill #0
  Filled 2021-07-28: qty 30, 30d supply, fill #1
  Filled 2021-08-30: qty 30, 30d supply, fill #2

## 2021-06-29 MED ORDER — DAPAGLIFLOZIN PROPANEDIOL 10 MG PO TABS
10.0000 mg | ORAL_TABLET | Freq: Every day | ORAL | 6 refills | Status: DC
  Filled 2021-06-29: qty 30, 30d supply, fill #0
  Filled 2021-07-26: qty 30, 30d supply, fill #1
  Filled 2021-08-27: qty 30, 30d supply, fill #2
  Filled 2021-09-27: qty 30, 30d supply, fill #0
  Filled 2021-11-01: qty 30, 30d supply, fill #1
  Filled 2021-11-30: qty 30, 30d supply, fill #2
  Filled 2021-12-30: qty 30, 30d supply, fill #3

## 2021-06-29 MED ORDER — RIVAROXABAN 20 MG PO TABS
ORAL_TABLET | Freq: Every day | ORAL | 1 refills | Status: DC
  Filled 2021-06-29: qty 90, fill #0
  Filled 2021-08-23 – 2021-11-24 (×2): qty 90, 90d supply, fill #0

## 2021-06-29 NOTE — Patient Instructions (Signed)
Diabetes Mellitus and Nutrition, Adult When you have diabetes, or diabetes mellitus, it is very important to have healthy eating habits because your blood sugar (glucose) levels are greatly affected by what you eat and drink. Eating healthy foods in the right amounts, at about the same times every day, can help you:  Control your blood glucose.  Lower your risk of heart disease.  Improve your blood pressure.  Reach or maintain a healthy weight. What can affect my meal plan? Every person with diabetes is different, and each person has different needs for a meal plan. Your health care provider may recommend that you work with a dietitian to make a meal plan that is best for you. Your meal plan may vary depending on factors such as:  The calories you need.  The medicines you take.  Your weight.  Your blood glucose, blood pressure, and cholesterol levels.  Your activity level.  Other health conditions you have, such as heart or kidney disease. How do carbohydrates affect me? Carbohydrates, also called carbs, affect your blood glucose level more than any other type of food. Eating carbs naturally raises the amount of glucose in your blood. Carb counting is a method for keeping track of how many carbs you eat. Counting carbs is important to keep your blood glucose at a healthy level, especially if you use insulin or take certain oral diabetes medicines. It is important to know how many carbs you can safely have in each meal. This is different for every person. Your dietitian can help you calculate how many carbs you should have at each meal and for each snack. How does alcohol affect me? Alcohol can cause a sudden decrease in blood glucose (hypoglycemia), especially if you use insulin or take certain oral diabetes medicines. Hypoglycemia can be a life-threatening condition. Symptoms of hypoglycemia, such as sleepiness, dizziness, and confusion, are similar to symptoms of having too much  alcohol.  Do not drink alcohol if: ? Your health care provider tells you not to drink. ? You are pregnant, may be pregnant, or are planning to become pregnant.  If you drink alcohol: ? Do not drink on an empty stomach. ? Limit how much you use to:  0-1 drink a day for women.  0-2 drinks a day for men. ? Be aware of how much alcohol is in your drink. In the [place] one drink equals one 12 oz bottle of beer (355 mL), one 5 oz glass of wine (148 mL), or one 1 oz glass of hard liquor (44 mL). ? Keep yourself hydrated with water, diet soda, or unsweetened iced tea.  Keep in mind that regular soda, juice, and other mixers may contain a lot of sugar and must be counted as carbs. What are tips for following this plan? Reading food labels  Start by checking the serving size on the "Nutrition Facts" label of packaged foods and drinks. The amount of calories, carbs, fats, and other nutrients listed on the label is based on one serving of the item. Many items contain more than one serving per package.  Check the total grams (g) of carbs in one serving. You can calculate the number of servings of carbs in one serving by dividing the total carbs by 15. For example, if a food has 30 g of total carbs per serving, it would be equal to 2 servings of carbs.  Check the number of grams (g) of saturated fats and trans fats in one serving. Choose foods that have   a low amount or none of these fats.  Check the number of milligrams (mg) of salt (sodium) in one serving. Most people should limit total sodium intake to less than 2,300 mg per day.  Always check the nutrition information of foods labeled as "low-fat" or "nonfat." These foods may be higher in added sugar or refined carbs and should be avoided.  Talk to your dietitian to identify your daily goals for nutrients listed on the label. Shopping  Avoid buying canned, pre-made, or processed foods. These foods tend to be high in fat, sodium, and added  sugar.  Shop around the outside edge of the grocery store. This is where you will most often find fresh fruits and vegetables, bulk grains, fresh meats, and fresh dairy. Cooking  Use low-heat cooking methods, such as baking, instead of high-heat cooking methods like deep frying.  Cook using healthy oils, such as olive, canola, or sunflower oil.  Avoid cooking with butter, cream, or high-fat meats. Meal planning  Eat meals and snacks regularly, preferably at the same times every day. Avoid going long periods of time without eating.  Eat foods that are high in fiber, such as fresh fruits, vegetables, beans, and whole grains. Talk with your dietitian about how many servings of carbs you can eat at each meal.  Eat 4-6 oz (112-168 g) of lean protein each day, such as lean meat, chicken, fish, eggs, or tofu. One ounce (oz) of lean protein is equal to: ? 1 oz (28 g) of meat, chicken, or fish. ? 1 egg. ?  cup (62 g) of tofu.  Eat some foods each day that contain healthy fats, such as avocado, nuts, seeds, and fish.   What foods should I eat? Fruits Berries. Apples. Oranges. Peaches. Apricots. Plums. Grapes. Mango. Papaya. Pomegranate. Kiwi. Cherries. Vegetables Lettuce. Spinach. Leafy greens, including kale, chard, collard greens, and mustard greens. Beets. Cauliflower. Cabbage. Broccoli. Carrots. Green beans. Tomatoes. Peppers. Onions. Cucumbers. Brussels sprouts. Grains Whole grains, such as whole-wheat or whole-grain bread, crackers, tortillas, cereal, and pasta. Unsweetened oatmeal. Quinoa. Brown or wild rice. Meats and other proteins Seafood. Poultry without skin. Lean cuts of poultry and beef. Tofu. Nuts. Seeds. Dairy Low-fat or fat-free dairy products such as milk, yogurt, and cheese. The items listed above may not be a complete list of foods and beverages you can eat. Contact a dietitian for more information. What foods should I avoid? Fruits Fruits canned with  syrup. Vegetables Canned vegetables. Frozen vegetables with butter or cream sauce. Grains Refined white flour and flour products such as bread, pasta, snack foods, and cereals. Avoid all processed foods. Meats and other proteins Fatty cuts of meat. Poultry with skin. Breaded or fried meats. Processed meat. Avoid saturated fats. Dairy Full-fat yogurt, cheese, or milk. Beverages Sweetened drinks, such as soda or iced tea. The items listed above may not be a complete list of foods and beverages you should avoid. Contact a dietitian for more information. Questions to ask a health care provider  Do I need to meet with a diabetes educator?  Do I need to meet with a dietitian?  What number can I call if I have questions?  When are the best times to check my blood glucose? Where to find more information:  American Diabetes Association: diabetes.org  Academy of Nutrition and Dietetics: www.eatright.org  National Institute of Diabetes and Digestive and Kidney Diseases: www.niddk.nih.gov  Association of Diabetes Care and Education Specialists: www.diabeteseducator.org Summary  It is important to have healthy eating   habits because your blood sugar (glucose) levels are greatly affected by what you eat and drink.  A healthy meal plan will help you control your blood glucose and maintain a healthy lifestyle.  Your health care provider may recommend that you work with a dietitian to make a meal plan that is best for you.  Keep in mind that carbohydrates (carbs) and alcohol have immediate effects on your blood glucose levels. It is important to count carbs and to use alcohol carefully. This information is not intended to replace advice given to you by your health care provider. Make sure you discuss any questions you have with your health care provider. Document Revised: 08/06/2019 Document Reviewed: 08/06/2019 Elsevier Patient Education  2021 Elsevier Inc.  

## 2021-06-29 NOTE — Progress Notes (Signed)
Has a knot in abdomen near belly button.

## 2021-06-29 NOTE — Progress Notes (Signed)
Subjective:  Patient ID: Paul Mclaughlin, male    DOB: 08/31/1956  Age: 65 y.o. MRN: [id number]  CC: Diabetes   HPI Paul Mclaughlin is a 65 y.o. 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  Interval History: Last seen at the CHF clinic in 03/2021 and notes reviewed.  Patient could not tolerate trial of Tadalafil due to headaches per notes. He denies presence of weight gain, chest pain, pedal edema. His liver cirrhosis is also stable and he does not consume alcohol.  He complains of a periumbilical knot which sticks out on some occassions and has been present for a couple of months.  Denies presence of nausea, vomiting, diarrhea or constipation.  Lesion is not tender.  His A1c is 8.0 up from 5.3. His sugar is 200 and this is fasting. He has not been checking his sugars at home.  Endorses compliance with [place].  He was on glipizide in the past which was discontinued due to A1c of 5.3. He endorses presence of visual symptoms and floaters once in a while.  Past Medical History:  Diagnosis Date   Atrial fibrillation ([place])    Cellulitis and abscess of left leg 06/2016   CHF (congestive heart failure) ([place])    Diabetes ([place])    Dyspnea     Past Surgical History:  Procedure Laterality Date   AMPUTATION Left 06/24/2016   Procedure: FOOT FIFTH RAY TOE AMPUTATION;  Surgeon: Newt Minion, MD;  Location: [facility];  Service: Orthopedics;  Laterality: Left;   IR PARACENTESIS  11/12/2020   IR PARACENTESIS  11/13/2020   IR PARACENTESIS  11/16/2020   open heart surgery     As a child.  31 years old.  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    RIGHT HEART CATH N/A 02/17/2021   Procedure: RIGHT  HEART CATH;  Surgeon: Larey Dresser, MD;  Location: [facility];  Service: Cardiovascular;  Laterality: N/A;   RIGHT/LEFT HEART CATH AND CORONARY ANGIOGRAPHY N/A 03/30/2017   Procedure: Right/Left Heart Cath and Coronary Angiography;  Surgeon: Larey Dresser, MD;  Location: [facility];  Service: Cardiovascular;  Laterality: N/A;    Family History  Problem Relation Age of Onset   Diabetes Mellitus II Mother        ESRD   Diabetes Mellitus II Brother    Emphysema Father     Allergies  Allergen Reactions   Tadalafil Other (See Comments)    Patient experiences a head ache with this medication.    Outpatient Medications Prior to Visit  Medication Sig Dispense Refill   Accu-Chek Softclix Lancets lancets USE TO CHECK BLOOD SUGAR DAILY. (Patient taking differently: USE TO CHECK BLOOD SUGAR DAILY.) 100 each 2   albuterol (PROVENTIL HFA;VENTOLIN HFA) 108 (90 Base) MCG/ACT inhaler Inhale 2 puffs into the lungs every 6 (six) hours as needed for wheezing or shortness of breath. 1 Inhaler 0   atorvastatin (LIPITOR) 20 MG tablet TAKE 1 TABLET (20 MG TOTAL) BY MOUTH DAILY. 30 tablet 6   Blood Glucose Monitoring Suppl (ACCU-CHEK GUIDE ME) w/Device KIT USE TO CHECK BLOOD SUGAR DAILY. 1 kit 0   glucose blood test strip USE TO CHECK BLOOD SUGAR DAILY. (Patient taking differently: USE  TO CHECK BLOOD SUGAR DAILY.) 100 strip 3   Misc. Devices MISC Blood pressure monitor.  Diagnosis- hypertension 1 each 0   spironolactone (ALDACTONE) 25 MG tablet TAKE 1 TABLET (25 MG TOTAL) BY MOUTH DAILY. 30 tablet 11   torsemide (DEMADEX) 20 MG tablet Take 1 tablet (20 mg total) by mouth 2 (two) times daily. 60 tablet 3   amLODipine (NORVASC) 10 MG tablet Take 1 tablet (10 mg total) by mouth daily. 30 tablet 0   dapagliflozin propanediol (FARXIGA) 10 MG TABS tablet Take 1 tablet (10 mg total) by mouth daily before breakfast. 30 tablet 1   rivaroxaban (XARELTO) 20 MG TABS tablet TAKE 1 TABLET BY MOUTH  DAILY WITH SUPPER. 90 tablet 1   No facility-administered medications prior to visit.     ROS Review of Systems  Constitutional:  Negative for activity change and appetite change.  HENT:  Negative for sinus pressure and sore throat.   Eyes:  Negative for visual disturbance.  Respiratory:  Negative for cough, chest tightness and shortness of breath.   Cardiovascular:  Negative for chest pain and leg swelling.  Gastrointestinal:  Negative for abdominal distention, abdominal pain, constipation and diarrhea.  Endocrine: Negative.   Genitourinary:  Negative for dysuria.  Musculoskeletal:  Negative for joint swelling and myalgias.  Skin:  Negative for rash.  Allergic/Immunologic: Negative.   Neurological:  Negative for weakness, light-headedness and numbness.  Psychiatric/Behavioral:  Negative for dysphoric mood and suicidal ideas.    Objective:  BP 129/68   Pulse 60   Ht $R'6\' 1"'NX$  (1.854 m)   Wt 226 lb (102.5 kg)   SpO2 99%   BMI 29.82 kg/m   BP/Weight 06/29/2021 4/62/7035 0/0/9381  Systolic BP 829 937 169  Diastolic BP 68 69 68  Wt. (Lbs) 226 233 221  BMI 29.82 30.74 29.16      Physical Exam Constitutional:      Appearance: He is well-developed.  Cardiovascular:     Rate and Rhythm: Normal rate.     Heart sounds: Normal heart sounds. No murmur heard. Pulmonary:     Effort: Pulmonary effort is normal.     Breath sounds: Normal breath sounds. No wheezing or rales.  Chest:     Chest wall: No tenderness.  Abdominal:     General: Bowel sounds are normal. There is no distension.     Palpations: Abdomen is soft. There is mass (supraumbilical mass, firm, not tender to palpation).     Tenderness: There is no abdominal tenderness.  Musculoskeletal:        General: Normal range of motion.     Right lower leg: No edema.     Left lower leg: No edema.  Neurological:     Mental Status: He is alert and oriented to person, place, and time.  Psychiatric:        Mood and Affect:  Mood normal.   Diabetic Foot Exam - Simple   Simple Foot Form Diabetic Foot exam was performed with the following findings: Yes 06/29/2021  3:54 PM  Visual Inspection See comments: Yes Sensation Testing See comments: Yes Pulse Check Posterior Tibialis and Dorsalis pulse intact bilaterally: Yes Comments Left fifth toe amputation Abnormal monofilament testing in lower half of right sole; left foot is normal     CMP Latest Ref Rng & Units 03/30/2021 03/17/2021 02/22/2021  Glucose 70 - 99 mg/dL 174(H) 169(H) 157(H)  BUN 8 - 23 mg/dL 34(H) 38(H) 59(H)  Creatinine 0.61 - 1.24 mg/dL 1.36(H) 1.81(H)  2.80(H)  Sodium 135 - 145 mmol/L 133(L) 135 126(L)  Potassium 3.5 - 5.1 mmol/L 4.7 4.3 3.9  Chloride 98 - 111 mmol/L 101 101 91(L)  CO2 22 - 32 mmol/L 25 23 21(L)  Calcium 8.9 - 10.3 mg/dL 9.4 9.1 8.8(L)  Total Protein 6.5 - 8.1 g/dL - - -  Total Bilirubin 0.3 - 1.2 mg/dL - - -  Alkaline Phos 38 - 126 U/L - - -  AST 15 - 41 U/L - - -  ALT 0 - 44 U/L - - -    Lipid Panel     Component Value Date/Time   CHOL 120 12/31/2019 1520   TRIG 61 12/31/2019 1520   HDL 33 (L) 12/31/2019 1520   CHOLHDL 3.6 12/31/2019 1520   CHOLHDL 2.3 10/03/2017 1457   VLDL 14 10/03/2017 1457   LDLCALC 74 12/31/2019 1520    CBC    Component Value Date/Time   WBC 4.7 02/17/2021 1125   RBC 3.91 (L) 02/17/2021 1125   HGB 10.9 (L) 02/17/2021 1359   HGB 9.9 (L) 03/16/2017 1644   HCT 32.0 (L) 02/17/2021 1359   HCT 30.1 (L) 03/16/2017 1644   PLT 200 02/17/2021 1125   PLT 222 03/16/2017 1644   MCV 89.3 02/17/2021 1125   MCV 97 03/16/2017 1644   MCH 28.9 02/17/2021 1125   MCHC 32.4 02/17/2021 1125   RDW 15.0 02/17/2021 1125   RDW 13.8 03/16/2017 1644   LYMPHSABS 0.8 11/18/2020 0120   MONOABS 0.4 11/18/2020 0120   EOSABS 0.1 11/18/2020 0120   BASOSABS 0.0 11/18/2020 0120   Lab Results  Component Value Date   HGBA1C 5.3 11/10/2020      Assessment & Plan:  1. Type 2 diabetes mellitus with  complication, without long-term current use of insulin (HCC) Uncontrolled with A1c of 8.0 which is up from 5.3 previously; goal is less than 7.0 We will place him back on glipizide; low dose to prevent hypoglycemia. Holding off on metformin as his GFR did drop to 24 in 02/2021 Counseled on Diabetic diet, my plate method, 470 minutes of moderate intensity exercise/week Blood sugar logs with fasting goals of 80-120 mg/dl, random of less than 180 and in the event of sugars less than 60 mg/dl or greater than 400 mg/dl encouraged to notify the clinic. Advised on the need for annual eye exams, annual foot exams, Pneumonia vaccine. - POCT glucose (manual entry) - POCT glycosylated hemoglobin (Hb A1C) - Microalbumin / creatinine urine ratio - Ambulatory referral to Ophthalmology - dapagliflozin propanediol (FARXIGA) 10 MG TABS tablet; Take 1 tablet (10 mg total) by mouth daily before breakfast.  Dispense: 30 tablet; Refill: 6 - LP+Non-HDL Cholesterol - CMP14+EGFR - glipiZIDE (GLUCOTROL) 5 MG tablet; Take 0.5 tablets (2.5 mg total) by mouth 2 (two) times daily before a meal.  Dispense: 60 tablet; Refill: 6  2. Screening for colon cancer He declines colonoscopy - Cologuard  3. Paroxysmal atrial fibrillation (HCC) Currently in sinus rhythm - rivaroxaban (XARELTO) 20 MG TABS tablet; TAKE 1 TABLET BY MOUTH DAILY WITH SUPPER.  Dispense: 90 tablet; Refill: 1  4. Hypertensive heart disease with chronic combined systolic and diastolic congestive heart failure ([place]) Euvolemic with EF of 65 to 70% from 01/2021, severely elevated pulmonary artery pressure Previously on tadalafil which she could not tolerate Continue guideline directed medical therapy Follow-up with cardiology  5. Hypertension associated with diabetes ([place]) Controlled Counseled on blood pressure goal of less than 130/80, low-sodium, DASH diet, medication compliance, 150 minutes  of moderate intensity exercise per week. Discussed medication  compliance, adverse effects. - amLODipine (NORVASC) 10 MG tablet; Take 1 tablet (10 mg total) by mouth daily.  Dispense: 30 tablet; Refill: 6  6. Periumbilical mass Not tender, no evidence of obstruction or strangulation Suspicious for ventral hernia - CT Abdomen Pelvis W Contrast; Future     Meds ordered this encounter  Medications   dapagliflozin propanediol (FARXIGA) 10 MG TABS tablet    Sig: Take 1 tablet (10 mg total) by mouth daily before breakfast.    Dispense:  30 tablet    Refill:  6   amLODipine (NORVASC) 10 MG tablet    Sig: Take 1 tablet (10 mg total) by mouth daily.    Dispense:  30 tablet    Refill:  6   glipiZIDE (GLUCOTROL) 5 MG tablet    Sig: Take 0.5 tablets (2.5 mg total) by mouth 2 (two) times daily before a meal.    Dispense:  60 tablet    Refill:  6   rivaroxaban (XARELTO) 20 MG TABS tablet    Sig: TAKE 1 TABLET BY MOUTH DAILY WITH SUPPER.    Dispense:  90 tablet    Refill:  1     Follow-up: Return in about 3 months (around 09/29/2021) for Diabetes mellitus.       Charlott Rakes, MD, FAAFP. [facility] and [facility], [place]   06/29/2021, 6:07 PM

## 2021-06-29 NOTE — Telephone Encounter (Signed)
Requested medication (s) are due for refill today: Yes  Requested medication (s) are on the active medication list: Yes  Last refill:  04/27/21  Future visit scheduled: Yes  Notes to clinic:  Protocol indicates pt. Needs lab work.    Requested Prescriptions  Pending Prescriptions Disp Refills   dapagliflozin propanediol (FARXIGA) 10 MG TABS tablet 30 tablet 1    Sig: Take 1 tablet (10 mg total) by mouth daily before breakfast.     Endocrinology:  Diabetes - SGLT2 Inhibitors Failed - 06/28/2021  2:25 PM      Failed - Cr in normal range and within 360 days    Creat  Date Value Ref Range Status  11/29/2016 1.23 0.70 - 1.33 mg/dL Final    Comment:      For patients > or = 64 years of age: The upper reference limit for Creatinine is approximately 13% higher for people identified as African-American.      Creatinine, Ser  Date Value Ref Range Status  03/30/2021 1.36 (H) 0.61 - 1.24 mg/dL Final   Creatinine, Urine  Date Value Ref Range Status  11/29/2016 CANCELED 20 - 370 mg/dL     Comment:    Test not performed, no urine was received.    Result canceled by the ancillary           Failed - LDL in normal range and within 360 days    LDL Chol Calc (NIH)  Date Value Ref Range Status  12/31/2019 74 0 - 99 mg/dL Final          Failed - HBA1C is between 0 and 7.9 and within 180 days    Hemoglobin A1C  Date Value Ref Range Status  11/10/2020 5.3 4.0 - 5.6 % Final   HbA1c, POC (controlled diabetic range)  Date Value Ref Range Status  05/13/2020 5.5 0.0 - 7.0 % Final          Failed - eGFR in normal range and within 360 days    GFR, Est African American  Date Value Ref Range Status  11/29/2016 74 >=60 mL/min Final   GFR calc Af Amer  Date Value Ref Range Status  05/13/2020 74 >59 mL/min/1.73 Final    Comment:    **[facility] currently reports eGFR in compliance with the current**   recommendations of the [employer]. [facility] will   update  reporting as new guidelines are published from the NKF-ASN   Task force.    GFR, Est Non African American  Date Value Ref Range Status  11/29/2016 64 >=60 mL/min Final   GFR, Estimated  Date Value Ref Range Status  03/30/2021 58 (L) >60 mL/min Final    Comment:    (NOTE) Calculated using the CKD-EPI Creatinine Equation (2021)           Failed - Valid encounter within last 6 months    Recent Outpatient Visits           7 months ago Cirrhosis of liver with ascites, unspecified hepatic cirrhosis type [facility])   [place], [place], MD   7 months ago Type 2 diabetes mellitus with complication, without long-term current use of insulin ([place])   [place], [place], MD   1 year ago Type 2 diabetes mellitus with complication, without long-term current use of insulin ([place])   [place], Charlane Ferretti, MD   1 year ago Type 2 diabetes mellitus  with complication, without long-term current use of insulin ([place])   [place], [place], MD   2 years ago Type 2 diabetes mellitus with complication, without long-term current use of insulin [facility])   [place], Charlane Ferretti, MD       Future Appointments             Today Charlott Rakes, MD [place]

## 2021-06-30 ENCOUNTER — Other Ambulatory Visit: Payer: Self-pay

## 2021-06-30 LAB — CMP14+EGFR
ALT: 26 IU/L (ref 0–44)
AST: 23 IU/L (ref 0–40)
Albumin/Globulin Ratio: 1.5 (ref 1.2–2.2)
Albumin: 4.9 g/dL — ABNORMAL HIGH (ref 3.8–4.8)
Alkaline Phosphatase: 140 IU/L — ABNORMAL HIGH (ref 44–121)
BUN/Creatinine Ratio: 27 — ABNORMAL HIGH (ref 10–24)
BUN: 35 mg/dL — ABNORMAL HIGH (ref 8–27)
Bilirubin Total: 1.2 mg/dL (ref 0.0–1.2)
CO2: 21 mmol/L (ref 20–29)
Calcium: 9.5 mg/dL (ref 8.6–10.2)
Chloride: 100 mmol/L (ref 96–106)
Creatinine, Ser: 1.31 mg/dL — ABNORMAL HIGH (ref 0.76–1.27)
Globulin, Total: 3.2 g/dL (ref 1.5–4.5)
Glucose: 189 mg/dL — ABNORMAL HIGH (ref 70–99)
Potassium: 4.4 mmol/L (ref 3.5–5.2)
Sodium: 138 mmol/L (ref 134–144)
Total Protein: 8.1 g/dL (ref 6.0–8.5)
eGFR: 61 mL/min/{1.73_m2} (ref >59)

## 2021-06-30 LAB — MICROALBUMIN / CREATININE URINE RATIO
Creatinine, Urine: 44 mg/dL
Microalb/Creat Ratio: 176 mg/g creat — ABNORMAL HIGH (ref 0–29)
Microalbumin, Urine: 77.5 ug/mL

## 2021-06-30 LAB — LP+NON-HDL CHOLESTEROL
Cholesterol, Total: 103 mg/dL (ref 100–199)
HDL: 46 mg/dL (ref >39)
LDL Chol Calc (NIH): 42 mg/dL (ref 0–99)
Total Non-HDL-Chol (LDL+VLDL): 57 mg/dL (ref 0–129)
Triglycerides: 70 mg/dL (ref 0–149)
VLDL Cholesterol Cal: 15 mg/dL (ref 5–40)

## 2021-06-30 LAB — POCT GLYCOSYLATED HEMOGLOBIN (HGB A1C): HbA1c, POC (controlled diabetic range): 8 % — AB (ref 0.0–7.0)

## 2021-07-01 ENCOUNTER — Telehealth: Payer: Self-pay

## 2021-07-01 NOTE — Telephone Encounter (Signed)
Patient was called and a voicemail was left informing patient to return phone call for lab results. 

## 2021-07-01 NOTE — Telephone Encounter (Signed)
-----   Message from [name], MD sent at 06/30/2021  9:40 PM EDT ----- Please inform him that cholesterol is normal, kidney function is slightly abnormal but improved from previous labs. Liver function is slightly abnormal but this is likely from the liver cirrhosis.

## 2021-07-02 ENCOUNTER — Ambulatory Visit (HOSPITAL_COMMUNITY): Payer: Medicaid Other

## 2021-07-07 ENCOUNTER — Other Ambulatory Visit: Payer: Self-pay

## 2021-07-07 ENCOUNTER — Other Ambulatory Visit: Payer: Self-pay | Admitting: Family Medicine

## 2021-07-07 DIAGNOSIS — E118 Type 2 diabetes mellitus with unspecified complications: Secondary | ICD-10-CM

## 2021-07-08 ENCOUNTER — Other Ambulatory Visit: Payer: Self-pay

## 2021-07-08 MED ORDER — ATORVASTATIN CALCIUM 20 MG PO TABS
ORAL_TABLET | Freq: Every day | ORAL | 6 refills | Status: DC
  Filled 2021-07-08: qty 30, 30d supply, fill #0
  Filled 2021-08-10: qty 30, 30d supply, fill #1
  Filled 2021-09-06: qty 30, 30d supply, fill #2
  Filled 2021-10-06: qty 30, 30d supply, fill #0
  Filled 2021-10-06: qty 30, 30d supply, fill #3
  Filled 2021-11-08: qty 30, 30d supply, fill #1
  Filled 2021-12-08: qty 30, 30d supply, fill #2
  Filled 2022-01-06: qty 30, 30d supply, fill #3

## 2021-07-08 NOTE — Telephone Encounter (Signed)
Requested Prescriptions  Pending Prescriptions Disp Refills  . atorvastatin (LIPITOR) 20 MG tablet 30 tablet 6    Sig: TAKE 1 TABLET (20 MG TOTAL) BY MOUTH DAILY.     Cardiovascular:  Antilipid - Statins Passed - 07/07/2021  3:23 PM      Passed - Total Cholesterol in normal range and within 360 days    Cholesterol, Total  Date Value Ref Range Status  06/29/2021 103 100 - 199 mg/dL Final         Passed - LDL in normal range and within 360 days    LDL Chol Calc (NIH)  Date Value Ref Range Status  06/29/2021 42 0 - 99 mg/dL Final         Passed - HDL in normal range and within 360 days    HDL  Date Value Ref Range Status  06/29/2021 46 >39 mg/dL Final         Passed - Triglycerides in normal range and within 360 days    Triglycerides  Date Value Ref Range Status  06/29/2021 70 0 - 149 mg/dL Final         Passed - Patient is not pregnant      Passed - Valid encounter within last 12 months    Recent Outpatient Visits          1 week ago Type 2 diabetes mellitus with complication, without long-term current use of insulin (HCC)   [facility] [place], [place], MD   7 months ago Cirrhosis of liver with ascites, unspecified hepatic cirrhosis type [facility])   [facility] [place], Ronco, MD   8 months ago Type 2 diabetes mellitus with complication, without long-term current use of insulin (HCC)   [facility] [place], Graton, MD   1 year ago Type 2 diabetes mellitus with complication, without long-term current use of insulin (HCC)   [facility] [place], [place]one, MD   1 year ago Type 2 diabetes mellitus with complication, without long-term current use of insulin (HCC)    [facility] [name], MD      Future Appointments            In 2 months [name], MD [facility]

## 2021-07-09 ENCOUNTER — Other Ambulatory Visit: Payer: Self-pay

## 2021-07-19 DIAGNOSIS — Z1211 Encounter for screening for malignant neoplasm of colon: Secondary | ICD-10-CM | POA: Diagnosis not present

## 2021-07-24 LAB — COLOGUARD: COLOGUARD: POSITIVE — AB

## 2021-07-26 ENCOUNTER — Other Ambulatory Visit: Payer: Self-pay

## 2021-07-26 ENCOUNTER — Telehealth: Payer: Self-pay

## 2021-07-26 ENCOUNTER — Other Ambulatory Visit: Payer: Self-pay | Admitting: Family Medicine

## 2021-07-26 DIAGNOSIS — R195 Other fecal abnormalities: Secondary | ICD-10-CM

## 2021-07-26 NOTE — Telephone Encounter (Signed)
Pt was called on both listed number and no VM picked up to leave a message.  Pt will be mailed a letter to contact office for lab results.  CRM created

## 2021-07-26 NOTE — Telephone Encounter (Signed)
-----   Message from [name], MD sent at 07/26/2021  8:37 AM EST ----- Please inform him he has a positive Cologuard test and I have placed an urgent referral for colonoscopy

## 2021-07-28 ENCOUNTER — Other Ambulatory Visit: Payer: Self-pay

## 2021-08-09 ENCOUNTER — Other Ambulatory Visit: Payer: Self-pay

## 2021-08-10 ENCOUNTER — Other Ambulatory Visit: Payer: Self-pay

## 2021-08-11 ENCOUNTER — Other Ambulatory Visit: Payer: Self-pay

## 2021-08-16 ENCOUNTER — Ambulatory Visit (HOSPITAL_COMMUNITY): Payer: Medicaid Other

## 2021-08-23 ENCOUNTER — Other Ambulatory Visit: Payer: Self-pay

## 2021-08-24 ENCOUNTER — Other Ambulatory Visit: Payer: Self-pay

## 2021-08-27 ENCOUNTER — Other Ambulatory Visit (HOSPITAL_COMMUNITY): Payer: Self-pay | Admitting: Cardiology

## 2021-08-27 ENCOUNTER — Other Ambulatory Visit: Payer: Self-pay

## 2021-08-30 ENCOUNTER — Other Ambulatory Visit: Payer: Self-pay

## 2021-08-31 ENCOUNTER — Other Ambulatory Visit (HOSPITAL_COMMUNITY): Payer: Self-pay | Admitting: Family Medicine

## 2021-08-31 ENCOUNTER — Other Ambulatory Visit: Payer: Self-pay

## 2021-08-31 NOTE — Telephone Encounter (Signed)
Requested medication (s) are due for refill today: yes  Requested medication (s) are on the active medication list: yes  Last refill: 04/06/21  #60  3 refills  Future visit scheduled yes 09/29/20  Notes to clinic: Historical provider  Requested Prescriptions  Pending Prescriptions Disp Refills   torsemide (DEMADEX) 20 MG tablet 60 tablet 3    Sig: Take 1 tablet (20 mg total) by mouth 2 (two) times daily.     There is no refill protocol information for this order

## 2021-09-01 ENCOUNTER — Other Ambulatory Visit: Payer: Self-pay

## 2021-09-01 MED ORDER — TORSEMIDE 20 MG PO TABS
20.0000 mg | ORAL_TABLET | Freq: Two times a day (BID) | ORAL | 3 refills | Status: DC
  Filled 2021-09-01 – 2021-09-27 (×2): qty 60, 30d supply, fill #0
  Filled 2021-11-01: qty 60, 30d supply, fill #1
  Filled 2021-11-30: qty 60, 30d supply, fill #2

## 2021-09-02 ENCOUNTER — Other Ambulatory Visit: Payer: Self-pay

## 2021-09-07 ENCOUNTER — Other Ambulatory Visit: Payer: Self-pay

## 2021-09-27 ENCOUNTER — Other Ambulatory Visit: Payer: Self-pay

## 2021-09-29 ENCOUNTER — Other Ambulatory Visit: Payer: Self-pay

## 2021-09-29 ENCOUNTER — Ambulatory Visit: Payer: Medicaid Other | Attending: Family Medicine | Admitting: Family Medicine

## 2021-09-29 VITALS — BP 109/65 | HR 75 | Ht 73.0 in | Wt 232.4 lb

## 2021-09-29 DIAGNOSIS — I480 Paroxysmal atrial fibrillation: Secondary | ICD-10-CM

## 2021-09-29 DIAGNOSIS — E1159 Type 2 diabetes mellitus with other circulatory complications: Secondary | ICD-10-CM | POA: Diagnosis not present

## 2021-09-29 DIAGNOSIS — E118 Type 2 diabetes mellitus with unspecified complications: Secondary | ICD-10-CM | POA: Diagnosis not present

## 2021-09-29 DIAGNOSIS — K429 Umbilical hernia without obstruction or gangrene: Secondary | ICD-10-CM | POA: Diagnosis not present

## 2021-09-29 DIAGNOSIS — R195 Other fecal abnormalities: Secondary | ICD-10-CM | POA: Diagnosis not present

## 2021-09-29 DIAGNOSIS — D649 Anemia, unspecified: Secondary | ICD-10-CM | POA: Diagnosis not present

## 2021-09-29 DIAGNOSIS — I152 Hypertension secondary to endocrine disorders: Secondary | ICD-10-CM

## 2021-09-29 DIAGNOSIS — E1142 Type 2 diabetes mellitus with diabetic polyneuropathy: Secondary | ICD-10-CM

## 2021-09-29 LAB — POCT GLYCOSYLATED HEMOGLOBIN (HGB A1C): HbA1c, POC (controlled diabetic range): 6.5 % (ref 0.0–7.0)

## 2021-09-29 LAB — GLUCOSE, POCT (MANUAL RESULT ENTRY): POC Glucose: 175 mg/dl — AB (ref 70–99)

## 2021-09-29 MED ORDER — METFORMIN HCL 500 MG PO TABS: 500.0000 mg | ORAL_TABLET | Freq: Every day | ORAL | 1 refills | Status: DC

## 2021-09-29 MED ORDER — GABAPENTIN 300 MG PO CAPS
300.0000 mg | ORAL_CAPSULE | Freq: Two times a day (BID) | ORAL | 3 refills | Status: DC
Start: 2021-09-29 — End: 2022-02-01
  Filled 2021-09-29: qty 60, 30d supply, fill #0
  Filled 2021-10-27: qty 60, 30d supply, fill #1
  Filled 2021-11-30: qty 60, 30d supply, fill #2
  Filled 2021-12-30: qty 60, 30d supply, fill #3

## 2021-09-29 NOTE — Progress Notes (Signed)
Numbness in fingertips.

## 2021-09-29 NOTE — Progress Notes (Signed)
Subjective:  Patient ID: Paul Mclaughlin, male    DOB: [birth year]  Age: 66 y.o. MRN: [id number]  CC: Diabetes   HPI Paul Mclaughlin is a 66 y.o. year old male with a history of type 2 diabetes mellitus (A1c 6.5), hypertension, atrial fibrillation (on anticoagulation with Xarelto and rate control with metoprolol), history of Congenital heart disease status post surgery, Pulmonary hypertension, congestive heart failure ( EF of 65-70% from 2-D echo 01/2021), peripheral arterial disease , liver cirrhosis, COVID-19 pneumonitis  Interval History: He has been experiencing numbness in his fingers  Quit taking Glipizide 2-3 days ago as he states it caused his vision to be abnormal and this resolved when he discontinued it.  He has been compliant with Wilder Glade and his statin. Also doing well on his antihypertensive. He had a positive Cologuard test for which I referred him to GI for colonoscopy but his chart revealed that they were unable to reach him to schedule an appointment.  He has no dyspnea or chest pain and has not been see cardiology in a while.  He remains on Xarelto for his A. fib and denies presence of lightheadedness, syncope. He complains of a periumbilical mass which he had complained of at his last visit and I had referred him for CT abdomen but he never underwent this test.  Mass is not painful and he denies presence of nausea, vomiting, abdominal pain. Past Medical History:  Diagnosis Date   Atrial fibrillation ([place])    Cellulitis and abscess of left leg 06/2016   CHF (congestive heart failure) ([place])    Diabetes ( [place])    Dyspnea     Past Surgical History:  Procedure Laterality Date   AMPUTATION Left 06/24/2016   Procedure: FOOT FIFTH RAY TOE AMPUTATION;  Surgeon: Newt Minion, MD;  Location: [place];  Service: Orthopedics;  Laterality: Left;   IR PARACENTESIS  11/12/2020   IR PARACENTESIS  11/13/2020   IR PARACENTESIS  11/16/2020   open heart surgery     As a child.  66 years  old.  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    RIGHT HEART CATH N/A 02/17/2021   Procedure: RIGHT HEART CATH;  Surgeon: Larey Dresser, MD;  Location: [place] [facility];  Service: Cardiovascular;  Laterality: N/A;   RIGHT/LEFT HEART CATH AND CORONARY ANGIOGRAPHY N/A 03/30/2017   Procedure: Right/Left Heart Cath and Coronary Angiography;  Surgeon: Larey Dresser, MD;  Location: [place] [facility];  Service: Cardiovascular;  Laterality: N/A;    Family History  Problem Relation Age of Onset   Diabetes Mellitus II Mother        ESRD   Diabetes Mellitus II Brother    Emphysema Father     Allergies  Allergen Reactions   Tadalafil Other (See Comments)    Patient experiences a head ache with this medication.    Outpatient Medications Prior to Visit  Medication Sig Dispense Refill   albuterol (PROVENTIL HFA;VENTOLIN HFA) 108 (90 Base) MCG/ACT inhaler Inhale 2 puffs into the lungs every 6 (six) hours as needed for wheezing or shortness of breath. 1 Inhaler 0   amLODipine (NORVASC) 10 MG tablet Take 1 tablet (10 mg total) by mouth daily. 30 tablet 6   atorvastatin (LIPITOR) 20 MG tablet TAKE 1 TABLET (20 MG TOTAL) BY MOUTH DAILY. 30 tablet 6   dapagliflozin propanediol (FARXIGA) 10 MG TABS tablet Take 1 tablet (10 mg total) by mouth daily before breakfast. 30 tablet 6   glipiZIDE (GLUCOTROL) 5 MG  tablet Take 0.5 tablets (2.5 mg total) by mouth 2 (two) times daily before a meal. 60 tablet 6   Misc. Devices MISC Blood pressure monitor.  Diagnosis- hypertension 1 each 0   rivaroxaban (XARELTO) 20 MG TABS tablet TAKE 1 TABLET BY MOUTH DAILY WITH SUPPER. 90 tablet 1   spironolactone (ALDACTONE) 25 MG tablet TAKE 1 TABLET (25 MG TOTAL) BY MOUTH DAILY. 30 tablet 11   torsemide (DEMADEX) 20 MG tablet Take 1 tablet (20 mg total) by mouth 2 (two) times daily. 60 tablet 3   No facility-administered medications prior to visit.     ROS Review of Systems  Constitutional:  Negative for activity change and appetite change.   HENT:  Negative for sinus pressure and sore throat.   Eyes:  Negative for visual disturbance.  Respiratory:  Negative for cough, chest tightness and shortness of breath.   Cardiovascular:  Negative for chest pain and leg swelling.  Gastrointestinal:  Negative for abdominal distention, abdominal pain, constipation and diarrhea.  Endocrine: Negative.   Genitourinary:  Negative for dysuria.  Musculoskeletal:  Negative for joint swelling and myalgias.  Skin:  Negative for rash.  Allergic/Immunologic: Negative.   Neurological:  Positive for numbness. Negative for weakness and light-headedness.  Psychiatric/Behavioral:  Negative for dysphoric mood and suicidal ideas.    Objective:  BP (!) 151/71    Pulse 75    Ht 6' 1"  (4.967 m)    Wt 232 lb 6.4 oz (105.4 kg)    SpO2 98%    BMI 30.66 kg/m   BP/Weight 09/29/2021 06/29/2021 5/91/6384  Systolic BP 665 993 570  Diastolic BP 71 68 69  Wt. (Lbs) 232.4 226 233  BMI 30.66 29.82 30.74      Physical Exam Constitutional:      Appearance: He is well-developed.  Cardiovascular:     Rate and Rhythm: Normal rate.     Heart sounds: Normal heart sounds. No murmur heard. Pulmonary:     Effort: Pulmonary effort is normal.     Breath sounds: Normal breath sounds. No wheezing or rales.  Chest:     Chest wall: No tenderness.  Abdominal:     General: Bowel sounds are normal. There is no distension.     Palpations: Abdomen is soft. There is no mass.     Tenderness: There is no abdominal tenderness.     Hernia: A hernia (periumbilical) is present.  Musculoskeletal:        General: Normal range of motion.     Right lower leg: No edema.     Left lower leg: No edema.  Neurological:     Mental Status: He is alert and oriented to person, place, and time.  Psychiatric:        Mood and Affect: Mood normal.    CMP Latest Ref Rng & Units 06/29/2021 03/30/2021 03/17/2021  Glucose 70 - 99 mg/dL 189(H) 174(H) 169(H)  BUN 8 - 27 mg/dL 35(H) 34(H) 38(H)   Creatinine 0.76 - 1.27 mg/dL 1.31(H) 1.36(H) 1.81(H)  Sodium 134 - 144 mmol/L 138 133(L) 135  Potassium 3.5 - 5.2 mmol/L 4.4 4.7 4.3  Chloride 96 - 106 mmol/L 100 101 101  CO2 20 - 29 mmol/L 21 25 23   Calcium 8.6 - 10.2 mg/dL 9.5 9.4 9.1  Total Protein 6.0 - 8.5 g/dL 8.1 - -  Total Bilirubin 0.0 - 1.2 mg/dL 1.2 - -  Alkaline Phos 44 - 121 IU/L 140(H) - -  AST 0 - 40 IU/L 23 - -  ALT 0 - 44 IU/L 26 - -    Lipid Panel     Component Value Date/Time   CHOL 103 06/29/2021 1620   TRIG 70 06/29/2021 1620   HDL 46 06/29/2021 1620   CHOLHDL 3.6 12/31/2019 1520   CHOLHDL 2.3 10/03/2017 1457   VLDL 14 10/03/2017 1457   LDLCALC 42 06/29/2021 1620    CBC    Component Value Date/Time   WBC 4.7 02/17/2021 1125   RBC 3.91 (L) 02/17/2021 1125   HGB 10.9 (L) 02/17/2021 1359   HGB 9.9 (L) 03/16/2017 1644   HCT 32.0 (L) 02/17/2021 1359   HCT 30.1 (L) 03/16/2017 1644   PLT 200 02/17/2021 1125   PLT 222 03/16/2017 1644   MCV 89.3 02/17/2021 1125   MCV 97 03/16/2017 1644   MCH 28.9 02/17/2021 1125   MCHC 32.4 02/17/2021 1125   RDW 15.0 02/17/2021 1125   RDW 13.8 03/16/2017 1644   LYMPHSABS 0.8 11/18/2020 0120   MONOABS 0.4 11/18/2020 0120   EOSABS 0.1 11/18/2020 0120   BASOSABS 0.0 11/18/2020 0120    Lab Results  Component Value Date   HGBA1C 6.5 09/29/2021    Assessment & Plan:   1. Type 2 diabetes mellitus with complication, without long-term current use of insulin (HCC) Controlled with A1c of 6.5 Due to complaints of blurry vision with glipizide I have changed this to metformin Continue Farxiga Counseled on Diabetic diet, my plate method, 728 minutes of moderate intensity exercise/week Blood sugar logs with fasting goals of 80-120 mg/dl, random of less than 180 and in the event of sugars less than 60 mg/dl or greater than 400 mg/dl encouraged to notify the clinic. Advised on the need for annual eye exams, annual foot exams, Pneumonia vaccine. - POCT glucose (manual  entry) - POCT glycosylated hemoglobin (Hb A1C) - metFORMIN (GLUCOPHAGE) 500 MG tablet; Take 1 tablet (500 mg total) by mouth daily with breakfast.  Dispense: 90 tablet; Refill: 1 - CMP14+EGFR  2. Umbilical hernia without obstruction and without gangrene Not tender He has no nausea, no vomiting or symptoms of obstruction I have had my nurse reschedule this for him  3. Diabetic polyneuropathy associated with type 2 diabetes mellitus ([place]) Uncontrolled Will initiate gabapentin - gabapentin (NEURONTIN) 300 MG capsule; Take 1 capsule (300 mg total) by mouth 2 (two) times daily.  Dispense: 60 capsule; Refill: 3  4. Anemia, unspecified type Last hemoglobin was 10.8 in 03/2021 Will repeat - CBC with Differential/Platelet  5. Positive colorectal cancer screening using DNA-based stool test Referred for colonoscopy due to positive Cologuard test but GI was unable to reach him to schedule We have provided him with the number to call the GI office to schedule  6. Hypertension associated with diabetes ([place]) Controlled Continue spironolactone  7. Paroxysmal atrial fibrillation (HCC) Currently in sinus rhythm Anticoagulation with Eliquis Metoprolol was discontinued during hospitalization due to soft blood pressure  Health Care Maintenance: Declines PCV 20 No orders of the defined types were placed in this encounter.          Charlott Rakes, MD, FAAFP. [facility] [place], [place]   09/29/2021, 2:13 PM

## 2021-09-29 NOTE — Patient Instructions (Signed)
[facility] [phone]

## 2021-09-30 ENCOUNTER — Other Ambulatory Visit: Payer: Self-pay

## 2021-09-30 DIAGNOSIS — E119 Type 2 diabetes mellitus without complications: Secondary | ICD-10-CM | POA: Diagnosis not present

## 2021-09-30 DIAGNOSIS — H2513 Age-related nuclear cataract, bilateral: Secondary | ICD-10-CM | POA: Diagnosis not present

## 2021-09-30 DIAGNOSIS — H524 Presbyopia: Secondary | ICD-10-CM | POA: Diagnosis not present

## 2021-09-30 LAB — CBC WITH DIFFERENTIAL/PLATELET
Basophils Absolute: 0.1 10*3/uL (ref 0.0–0.2)
Basos: 1 %
EOS (ABSOLUTE): 0.4 10*3/uL (ref 0.0–0.4)
Eos: 5 %
Hematocrit: 41.5 % (ref 37.5–51.0)
Hemoglobin: 14.1 g/dL (ref 13.0–17.7)
Immature Grans (Abs): 0 10*3/uL (ref 0.0–0.1)
Immature Granulocytes: 0 %
Lymphocytes Absolute: 1.2 10*3/uL (ref 0.7–3.1)
Lymphs: 14 %
MCH: 32.2 pg (ref 26.6–33.0)
MCHC: 34 g/dL (ref 31.5–35.7)
MCV: 95 fL (ref 79–97)
Monocytes Absolute: 0.6 10*3/uL (ref 0.1–0.9)
Monocytes: 7 %
Neutrophils Absolute: 6 10*3/uL (ref 1.4–7.0)
Neutrophils: 73 %
Platelets: 274 10*3/uL (ref 150–450)
RBC: 4.38 x10E6/uL (ref 4.14–5.80)
RDW: 12.3 % (ref 11.6–15.4)
WBC: 8.2 10*3/uL (ref 3.4–10.8)

## 2021-09-30 LAB — CMP14+EGFR
ALT: 25 IU/L (ref 0–44)
AST: 23 IU/L (ref 0–40)
Albumin/Globulin Ratio: 1.7 (ref 1.2–2.2)
Albumin: 4.9 g/dL — ABNORMAL HIGH (ref 3.8–4.8)
Alkaline Phosphatase: 157 IU/L — ABNORMAL HIGH (ref 44–121)
BUN/Creatinine Ratio: 26 — ABNORMAL HIGH (ref 10–24)
BUN: 37 mg/dL — ABNORMAL HIGH (ref 8–27)
Bilirubin Total: 1 mg/dL (ref 0.0–1.2)
CO2: 25 mmol/L (ref 20–29)
Calcium: 9.8 mg/dL (ref 8.6–10.2)
Chloride: 97 mmol/L (ref 96–106)
Creatinine, Ser: 1.42 mg/dL — ABNORMAL HIGH (ref 0.76–1.27)
Globulin, Total: 2.9 g/dL (ref 1.5–4.5)
Glucose: 178 mg/dL — ABNORMAL HIGH (ref 70–99)
Potassium: 4.3 mmol/L (ref 3.5–5.2)
Sodium: 138 mmol/L (ref 134–144)
Total Protein: 7.8 g/dL (ref 6.0–8.5)
eGFR: 55 mL/min/{1.73_m2} — ABNORMAL LOW (ref >59)

## 2021-09-30 LAB — HM DIABETES EYE EXAM

## 2021-10-01 ENCOUNTER — Telehealth: Payer: Self-pay

## 2021-10-01 NOTE — Telephone Encounter (Signed)
-----   Message from [name], MD sent at 09/30/2021 10:56 AM EST ----- Please inform him that labs reveal slightly abnormal kidney function but this is stable compared to previous labs.  One of his liver enzymes is mildly elevated but this can be attributed to his liver cirrhosis.  Other labs are stable.

## 2021-10-01 NOTE — Telephone Encounter (Signed)
Patient name and DOB has been verified Patient was informed of lab results. Patient had no questions.  

## 2021-10-06 ENCOUNTER — Other Ambulatory Visit: Payer: Self-pay

## 2021-10-07 ENCOUNTER — Other Ambulatory Visit: Payer: Self-pay

## 2021-10-07 ENCOUNTER — Ambulatory Visit (HOSPITAL_COMMUNITY): Payer: Medicaid Other

## 2021-10-14 ENCOUNTER — Ambulatory Visit (HOSPITAL_COMMUNITY): Payer: Medicaid Other

## 2021-10-26 ENCOUNTER — Telehealth: Payer: Self-pay | Admitting: Family Medicine

## 2021-10-26 NOTE — Telephone Encounter (Signed)
.. °  Medicaid Managed Care   Unsuccessful Outreach Note  10/26/2021 Name: Paul Mclaughlin MRN: [id number] DOB: [birth year]  Referred by: [name], MD Reason for referral : High Risk Managed Medicaid (I called the patient today to get him scheduled with the MM Team. I left my name and number on his VM.)   An unsuccessful telephone outreach was attempted today. The patient was referred to the case management team for assistance with care management and care coordination.   Follow Up Plan: The care management team will reach out to the patient again over the next 14 days.     Weston Settle Care Guide, High Risk Medicaid Managed Care Embedded Care Coordination [facility]   [facility]

## 2021-10-27 ENCOUNTER — Other Ambulatory Visit: Payer: Self-pay

## 2021-10-28 ENCOUNTER — Ambulatory Visit (HOSPITAL_COMMUNITY): Payer: Medicaid Other

## 2021-10-29 ENCOUNTER — Other Ambulatory Visit: Payer: Self-pay

## 2021-11-01 ENCOUNTER — Other Ambulatory Visit: Payer: Self-pay

## 2021-11-02 ENCOUNTER — Other Ambulatory Visit: Payer: Self-pay

## 2021-11-08 ENCOUNTER — Other Ambulatory Visit: Payer: Self-pay

## 2021-11-09 ENCOUNTER — Telehealth: Payer: Self-pay | Admitting: Family Medicine

## 2021-11-09 NOTE — Telephone Encounter (Signed)
Handicap form has been received and patient will be called once ready for pick up.

## 2021-11-09 NOTE — Telephone Encounter (Signed)
Paul Mclaughlin dropped of paperwork and is aware of the 7-10 day turn around. Pt is requsting a phone call upon completion.

## 2021-11-24 ENCOUNTER — Other Ambulatory Visit: Payer: Self-pay

## 2021-11-30 ENCOUNTER — Other Ambulatory Visit: Payer: Self-pay

## 2021-12-01 ENCOUNTER — Other Ambulatory Visit: Payer: Self-pay

## 2021-12-06 ENCOUNTER — Other Ambulatory Visit: Payer: Self-pay

## 2021-12-07 ENCOUNTER — Other Ambulatory Visit: Payer: Self-pay

## 2021-12-08 ENCOUNTER — Other Ambulatory Visit: Payer: Self-pay

## 2021-12-30 ENCOUNTER — Other Ambulatory Visit (HOSPITAL_COMMUNITY): Payer: Self-pay | Admitting: Family Medicine

## 2021-12-30 ENCOUNTER — Other Ambulatory Visit: Payer: Self-pay

## 2021-12-30 IMAGING — US IR PARACENTESIS
1 series · 5 of 5 positions shown · non-contrast
Comparison: none

INDICATION: History of CAD with prior CABG, cirrhosis, and ascites. Request was

[Series 1: ir (id) (id)/(id)/(id) ir · 5 of 5 slices shown]
[im 1/5]
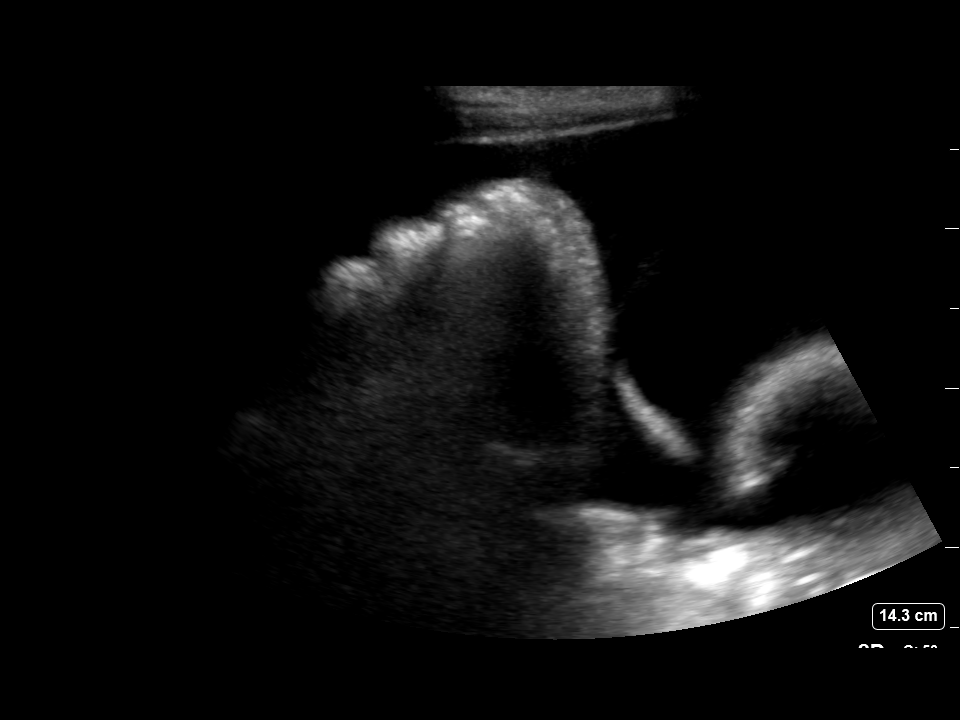
[im 2/5]
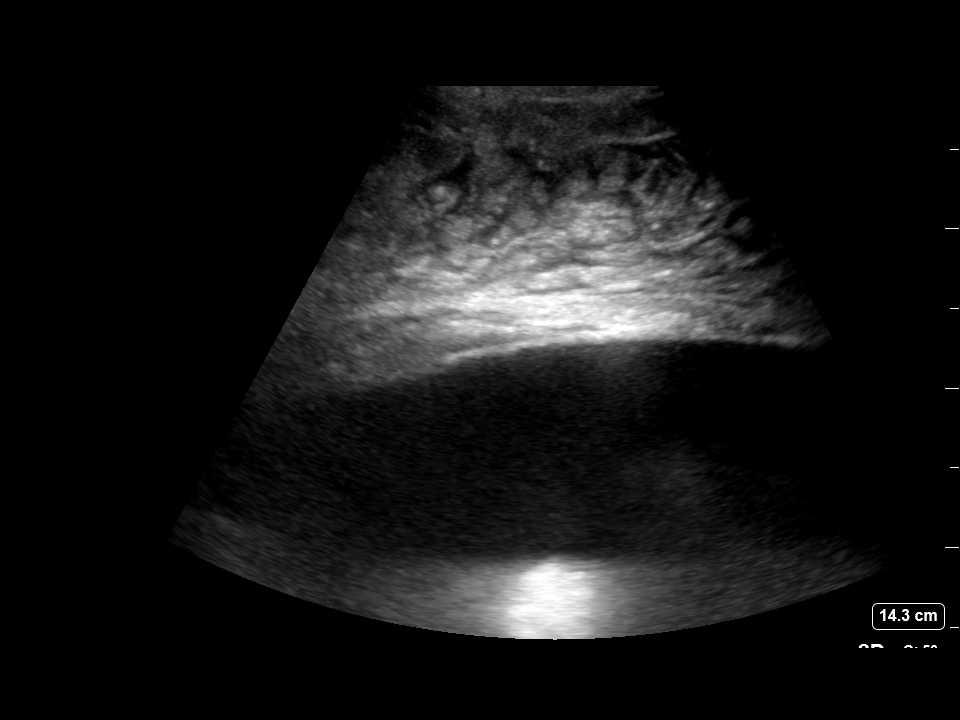
[im 3/5]
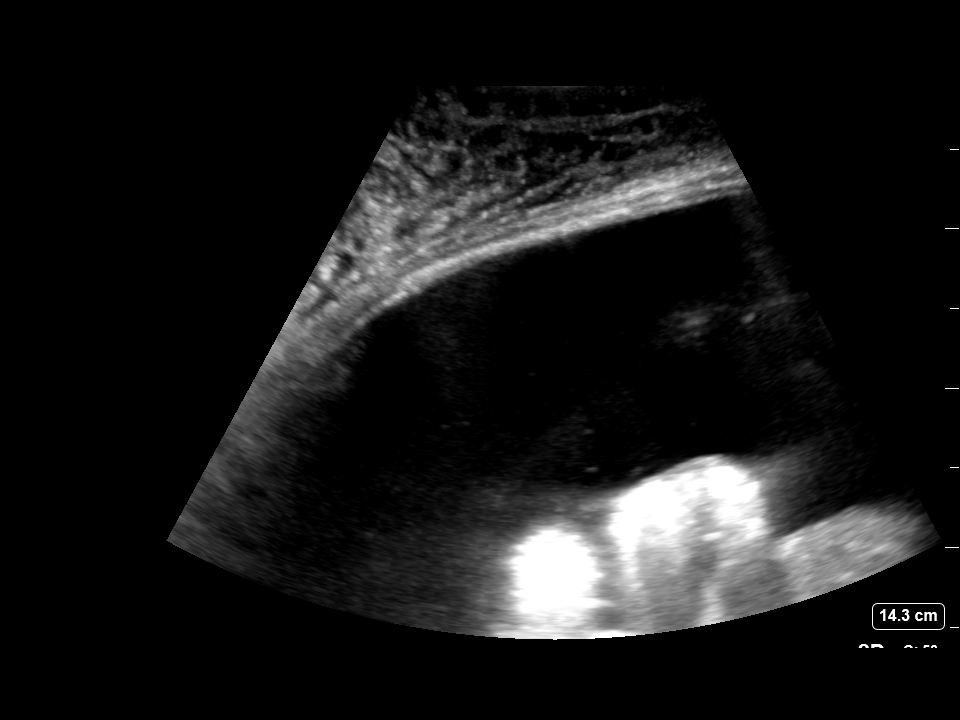
[im 4/5]
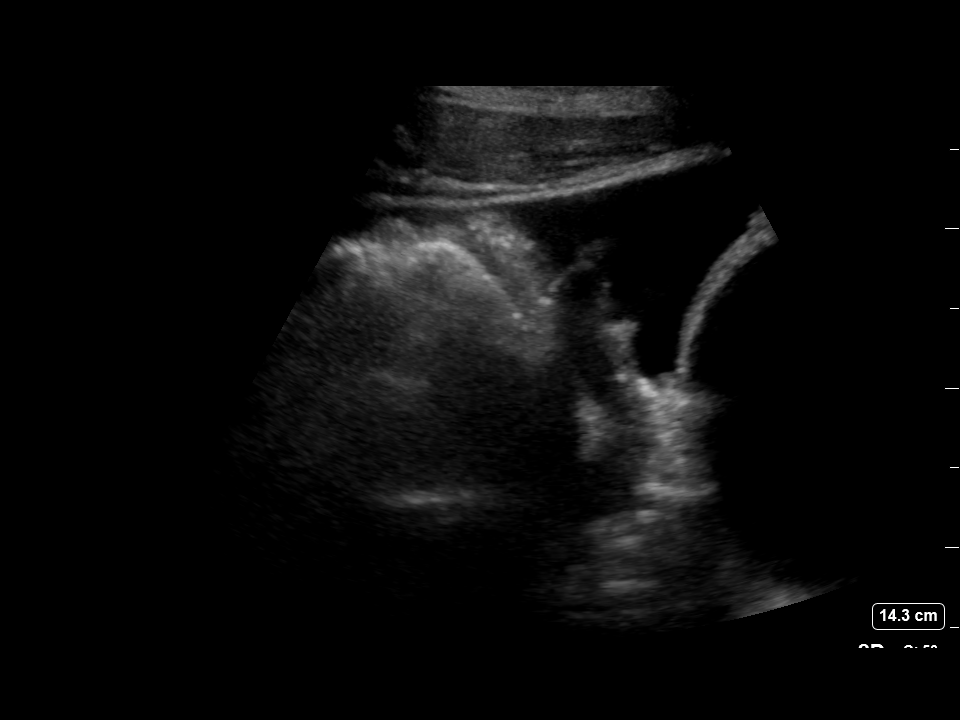
[im 5/5]
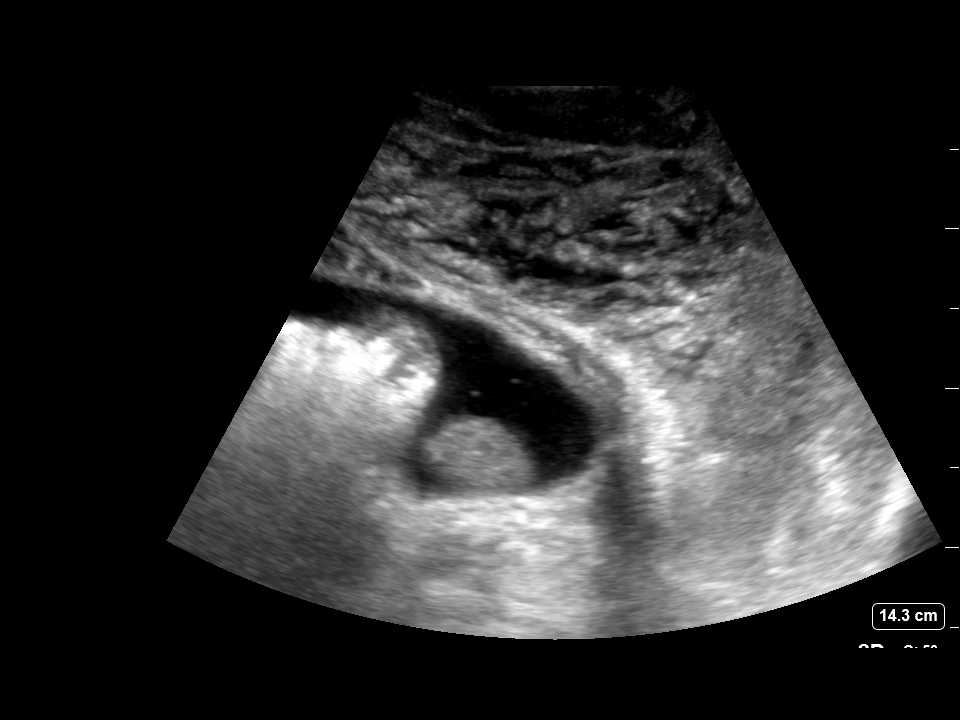

[5 of 5 positions shown; findings below may reference images not displayed]

made for therapeutic and diagnostic paracentesis

EXAM:
ULTRASOUND GUIDED PARACENTESIS

MEDICATIONS:
1% lidocaine 10 mL

COMPLICATIONS:
None immediate.

PROCEDURE:
Informed written consent was obtained from the patient after a
discussion of the risks, benefits and alternatives to treatment. A
timeout was performed prior to the initiation of the procedure.

Initial ultrasound scanning demonstrates a large amount of ascites
within the left lower abdominal quadrant. The left lower abdomen was
prepped and draped in the usual sterile fashion. 1% lidocaine was
used for local anesthesia.

Following this, a 19 gauge, 7-cm, Yueh catheter was introduced. An
ultrasound image was saved for documentation purposes. The
paracentesis was performed. The catheter was removed and a dressing
was applied. The patient tolerated the procedure well without
immediate post procedural complication.
Patient received post-procedure intravenous albumin; see nursing
notes for details.
FINDINGS: A total of approximately 8.5 L of amber-colored fluid was removed.
Samples were sent to the laboratory as requested by the clinical
team.
IMPRESSION: Successful ultrasound-guided paracentesis yielding 8.5 liters of
peritoneal fluid. Read by: Sorin Oxendine, NP

## 2021-12-30 MED ORDER — TORSEMIDE 20 MG PO TABS
20.0000 mg | ORAL_TABLET | Freq: Two times a day (BID) | ORAL | 2 refills | Status: DC
  Filled 2021-12-30: qty 60, 30d supply, fill #0
  Filled 2022-02-01: qty 60, 30d supply, fill #1
  Filled 2022-03-07: qty 60, 30d supply, fill #2

## 2021-12-31 ENCOUNTER — Other Ambulatory Visit: Payer: Self-pay

## 2022-01-07 ENCOUNTER — Other Ambulatory Visit: Payer: Self-pay

## 2022-02-01 ENCOUNTER — Other Ambulatory Visit: Payer: Self-pay | Admitting: Family Medicine

## 2022-02-01 DIAGNOSIS — E118 Type 2 diabetes mellitus with unspecified complications: Secondary | ICD-10-CM

## 2022-02-01 DIAGNOSIS — E1142 Type 2 diabetes mellitus with diabetic polyneuropathy: Secondary | ICD-10-CM

## 2022-02-02 ENCOUNTER — Other Ambulatory Visit: Payer: Self-pay

## 2022-02-02 MED ORDER — GABAPENTIN 300 MG PO CAPS
300.0000 mg | ORAL_CAPSULE | Freq: Two times a day (BID) | ORAL | 3 refills | Status: DC
  Filled 2022-02-02: qty 60, 30d supply, fill #0
  Filled 2022-03-07: qty 60, 30d supply, fill #1
  Filled 2022-04-28: qty 60, 30d supply, fill #2

## 2022-02-02 MED ORDER — DAPAGLIFLOZIN PROPANEDIOL 10 MG PO TABS
10.0000 mg | ORAL_TABLET | Freq: Every day | ORAL | 0 refills | Status: DC
  Filled 2022-02-02: qty 30, 30d supply, fill #0

## 2022-02-03 ENCOUNTER — Other Ambulatory Visit: Payer: Self-pay

## 2022-02-08 ENCOUNTER — Other Ambulatory Visit: Payer: Self-pay | Admitting: Family Medicine

## 2022-02-08 DIAGNOSIS — E1159 Type 2 diabetes mellitus with other circulatory complications: Secondary | ICD-10-CM

## 2022-02-08 DIAGNOSIS — E118 Type 2 diabetes mellitus with unspecified complications: Secondary | ICD-10-CM

## 2022-02-08 MED ORDER — AMLODIPINE BESYLATE 10 MG PO TABS
10.0000 mg | ORAL_TABLET | Freq: Every day | ORAL | 0 refills | Status: DC
  Filled 2022-02-08: qty 30, 30d supply, fill #0

## 2022-02-08 MED ORDER — ATORVASTATIN CALCIUM 20 MG PO TABS
ORAL_TABLET | Freq: Every day | ORAL | 0 refills | Status: DC
  Filled 2022-02-08: qty 30, 30d supply, fill #0

## 2022-02-09 ENCOUNTER — Other Ambulatory Visit: Payer: Self-pay

## 2022-02-24 ENCOUNTER — Other Ambulatory Visit: Payer: Self-pay | Admitting: Family Medicine

## 2022-02-24 ENCOUNTER — Other Ambulatory Visit: Payer: Self-pay

## 2022-02-24 DIAGNOSIS — I480 Paroxysmal atrial fibrillation: Secondary | ICD-10-CM

## 2022-02-25 ENCOUNTER — Other Ambulatory Visit: Payer: Self-pay | Admitting: Family Medicine

## 2022-02-25 ENCOUNTER — Other Ambulatory Visit: Payer: Self-pay

## 2022-02-25 DIAGNOSIS — I480 Paroxysmal atrial fibrillation: Secondary | ICD-10-CM

## 2022-02-25 MED ORDER — RIVAROXABAN 20 MG PO TABS
20.0000 mg | ORAL_TABLET | Freq: Every day | ORAL | 0 refills | Status: DC
  Filled 2022-02-25: qty 30, 30d supply, fill #0

## 2022-02-28 ENCOUNTER — Other Ambulatory Visit: Payer: Self-pay

## 2022-03-02 ENCOUNTER — Other Ambulatory Visit: Payer: Self-pay

## 2022-03-02 ENCOUNTER — Other Ambulatory Visit: Payer: Self-pay | Admitting: Family Medicine

## 2022-03-02 DIAGNOSIS — E118 Type 2 diabetes mellitus with unspecified complications: Secondary | ICD-10-CM

## 2022-03-02 MED ORDER — DAPAGLIFLOZIN PROPANEDIOL 10 MG PO TABS
10.0000 mg | ORAL_TABLET | Freq: Every day | ORAL | 0 refills | Status: DC
  Filled 2022-03-02: qty 30, 30d supply, fill #0

## 2022-03-03 ENCOUNTER — Other Ambulatory Visit: Payer: Self-pay

## 2022-03-07 ENCOUNTER — Other Ambulatory Visit: Payer: Self-pay | Admitting: Family Medicine

## 2022-03-07 ENCOUNTER — Other Ambulatory Visit: Payer: Self-pay

## 2022-03-07 DIAGNOSIS — E1159 Type 2 diabetes mellitus with other circulatory complications: Secondary | ICD-10-CM

## 2022-03-07 DIAGNOSIS — E118 Type 2 diabetes mellitus with unspecified complications: Secondary | ICD-10-CM

## 2022-03-08 ENCOUNTER — Other Ambulatory Visit: Payer: Self-pay

## 2022-03-31 ENCOUNTER — Other Ambulatory Visit: Payer: Self-pay | Admitting: Family Medicine

## 2022-03-31 DIAGNOSIS — I480 Paroxysmal atrial fibrillation: Secondary | ICD-10-CM

## 2022-04-01 ENCOUNTER — Other Ambulatory Visit: Payer: Self-pay

## 2022-04-01 ENCOUNTER — Other Ambulatory Visit: Payer: Self-pay | Admitting: Pharmacist

## 2022-04-01 DIAGNOSIS — I480 Paroxysmal atrial fibrillation: Secondary | ICD-10-CM

## 2022-04-01 DIAGNOSIS — E118 Type 2 diabetes mellitus with unspecified complications: Secondary | ICD-10-CM

## 2022-04-01 DIAGNOSIS — E1159 Type 2 diabetes mellitus with other circulatory complications: Secondary | ICD-10-CM

## 2022-04-01 MED ORDER — AMLODIPINE BESYLATE 10 MG PO TABS
10.0000 mg | ORAL_TABLET | Freq: Every day | ORAL | 1 refills | Status: DC
  Filled 2022-04-01: qty 30, 30d supply, fill #0
  Filled 2022-04-28: qty 30, 30d supply, fill #1

## 2022-04-01 MED ORDER — RIVAROXABAN 20 MG PO TABS
20.0000 mg | ORAL_TABLET | Freq: Every day | ORAL | 1 refills | Status: DC
  Filled 2022-04-01: qty 30, 30d supply, fill #0
  Filled 2022-04-28: qty 30, 30d supply, fill #1

## 2022-04-01 MED ORDER — ATORVASTATIN CALCIUM 20 MG PO TABS
ORAL_TABLET | Freq: Every day | ORAL | 1 refills | Status: DC
  Filled 2022-04-01: qty 30, 30d supply, fill #0
  Filled 2022-04-28: qty 30, 30d supply, fill #1

## 2022-04-04 ENCOUNTER — Other Ambulatory Visit (HOSPITAL_COMMUNITY): Payer: Self-pay | Admitting: Family Medicine

## 2022-04-04 ENCOUNTER — Other Ambulatory Visit: Payer: Self-pay | Admitting: Family Medicine

## 2022-04-04 ENCOUNTER — Other Ambulatory Visit: Payer: Self-pay

## 2022-04-04 ENCOUNTER — Other Ambulatory Visit (HOSPITAL_COMMUNITY): Payer: Self-pay | Admitting: Cardiology

## 2022-04-04 DIAGNOSIS — E118 Type 2 diabetes mellitus with unspecified complications: Secondary | ICD-10-CM

## 2022-04-04 MED ORDER — TORSEMIDE 20 MG PO TABS
20.0000 mg | ORAL_TABLET | Freq: Two times a day (BID) | ORAL | 0 refills | Status: DC
  Filled 2022-04-04: qty 60, 30d supply, fill #0

## 2022-04-04 MED ORDER — DAPAGLIFLOZIN PROPANEDIOL 10 MG PO TABS
10.0000 mg | ORAL_TABLET | Freq: Every day | ORAL | 0 refills | Status: DC
  Filled 2022-04-04: qty 30, 30d supply, fill #0

## 2022-04-05 ENCOUNTER — Other Ambulatory Visit: Payer: Self-pay

## 2022-04-05 MED ORDER — SPIRONOLACTONE 25 MG PO TABS
25.0000 mg | ORAL_TABLET | Freq: Every day | ORAL | 0 refills | Status: DC
  Filled 2022-04-05: qty 90, 90d supply, fill #0

## 2022-04-28 ENCOUNTER — Other Ambulatory Visit: Payer: Self-pay

## 2022-05-02 ENCOUNTER — Other Ambulatory Visit (HOSPITAL_COMMUNITY): Payer: Self-pay | Admitting: Family Medicine

## 2022-05-02 ENCOUNTER — Other Ambulatory Visit: Payer: Self-pay | Admitting: Family Medicine

## 2022-05-02 DIAGNOSIS — E118 Type 2 diabetes mellitus with unspecified complications: Secondary | ICD-10-CM

## 2022-05-03 ENCOUNTER — Other Ambulatory Visit: Payer: Self-pay

## 2022-05-03 MED ORDER — TORSEMIDE 20 MG PO TABS
20.0000 mg | ORAL_TABLET | Freq: Two times a day (BID) | ORAL | 0 refills | Status: DC
  Filled 2022-05-03: qty 60, 30d supply, fill #0

## 2022-05-03 MED ORDER — DAPAGLIFLOZIN PROPANEDIOL 10 MG PO TABS
10.0000 mg | ORAL_TABLET | Freq: Every day | ORAL | 0 refills | Status: DC
  Filled 2022-05-03: qty 30, 30d supply, fill #0

## 2022-05-03 NOTE — Telephone Encounter (Signed)
Requested medication (s) are due for refill today - yes  Requested medication (s) are on the active medication list -yes  Future visit scheduled -yes  Last refill: 04/04/22 #60  Notes to clinic: medication does not have protocol attached- sent for provider review   Requested Prescriptions  Pending Prescriptions Disp Refills   torsemide (DEMADEX) 20 MG tablet 60 tablet 0    Sig: Take 1 tablet (20 mg total) by mouth 2 (two) times daily.     There is no refill protocol information for this order       Requested Prescriptions  Pending Prescriptions Disp Refills   torsemide (DEMADEX) 20 MG tablet 60 tablet 0    Sig: Take 1 tablet (20 mg total) by mouth 2 (two) times daily.     There is no refill protocol information for this order

## 2022-05-03 NOTE — Telephone Encounter (Signed)
Requested medication (s) are due for refill today: yes  Requested medication (s) are on the active medication list: yes  Last refill:  04/04/22 #30 courtesy RF  Future visit scheduled: yes   Notes to clinic:  was going to refill with enough med to last until upcoming appt (# 15 tabs) but note attached to previous refill --"Please make PCP visit for additional refills."   Requested Prescriptions  Pending Prescriptions Disp Refills   dapagliflozin propanediol (FARXIGA) 10 MG TABS tablet 30 tablet 0    Sig: Take 1 tablet (10 mg total) by mouth daily before breakfast.     Endocrinology:  Diabetes - SGLT2 Inhibitors Failed - 05/02/2022 11:53 AM      Failed - Cr in normal range and within 360 days    Creat  Date Value Ref Range Status  11/29/2016 1.23 0.70 - 1.33 mg/dL Final    Comment:      For patients > or = 65 years of age: The upper reference limit for Creatinine is approximately 13% higher for people identified as African-American.      Creatinine, Ser  Date Value Ref Range Status  09/29/2021 1.42 (H) 0.76 - 1.27 mg/dL Final   Creatinine, Urine  Date Value Ref Range Status  11/29/2016 CANCELED 20 - 370 mg/dL     Comment:    Test not performed, no urine was received.    Result canceled by the ancillary          Failed - HBA1C is between 0 and 7.9 and within 180 days    HbA1c, POC (controlled diabetic range)  Date Value Ref Range Status  09/29/2021 6.5 0.0 - 7.0 % Final         Failed - eGFR in normal range and within 360 days    GFR, Est African American  Date Value Ref Range Status  11/29/2016 74 >=60 mL/min Final   GFR calc Af Amer  Date Value Ref Range Status  05/13/2020 74 >59 mL/min/1.73 Final    Comment:    **[facility] currently reports eGFR in compliance with the current**   recommendations of the National Kidney Foundation. [facility] will   update reporting as new guidelines are published from the NKF-ASN   Task force.    GFR, Est Non African  American  Date Value Ref Range Status  11/29/2016 64 >=60 mL/min Final   GFR, Estimated  Date Value Ref Range Status  03/30/2021 58 (L) >60 mL/min Final    Comment:    (NOTE) Calculated using the CKD-EPI Creatinine Equation (2021)    eGFR  Date Value Ref Range Status  09/29/2021 55 (L) >59 mL/min/1.73 Final         Failed - Valid encounter within last 6 months    Recent Outpatient Visits           7 months ago Type 2 diabetes mellitus with complication, without long-term current use of insulin (HCC)   [facility] [place], [place]ong, MD   10 months ago Type 2 diabetes mellitus with complication, without long-term current use of insulin (HCC)   [facility] [place], Enobong, MD   1 year ago Cirrhosis of liver with ascites, unspecified hepatic cirrhosis type (HCC)   [facility] [place], Enobong, MD   1 year ago Type 2 diabetes mellitus with complication, without long-term current use of insulin (HCC)   [facility] [place], Enobong, MD     1 year ago Type 2 diabetes mellitus with complication, without long-term current use of insulin ([place])   [place], En[place], MD       Future Appointments             In 2 weeks Charlott Rakes, MD [place]

## 2022-05-05 ENCOUNTER — Other Ambulatory Visit: Payer: Self-pay

## 2022-05-17 ENCOUNTER — Encounter: Payer: Self-pay | Admitting: Family Medicine

## 2022-05-17 ENCOUNTER — Ambulatory Visit: Payer: Medicare Other | Attending: Family Medicine | Admitting: Family Medicine

## 2022-05-17 ENCOUNTER — Other Ambulatory Visit: Payer: Self-pay

## 2022-05-17 ENCOUNTER — Ambulatory Visit
Admission: RE | Admit: 2022-05-17 | Discharge: 2022-05-17 | Disposition: A | Discharge status: Home / Self Care | Payer: Medicare Other | Source: Ambulatory Visit | Attending: Family Medicine | Admitting: Family Medicine

## 2022-05-17 VITALS — BP 124/64 | HR 107 | Temp 98.5°F | Ht 73.0 in | Wt 229.0 lb

## 2022-05-17 DIAGNOSIS — I272 Pulmonary hypertension, unspecified: Secondary | ICD-10-CM | POA: Insufficient documentation

## 2022-05-17 DIAGNOSIS — I152 Hypertension secondary to endocrine disorders: Secondary | ICD-10-CM

## 2022-05-17 DIAGNOSIS — K7460 Unspecified cirrhosis of liver: Secondary | ICD-10-CM | POA: Insufficient documentation

## 2022-05-17 DIAGNOSIS — I110 Hypertensive heart disease with heart failure: Secondary | ICD-10-CM

## 2022-05-17 DIAGNOSIS — I5042 Chronic combined systolic (congestive) and diastolic (congestive) heart failure: Secondary | ICD-10-CM | POA: Diagnosis not present

## 2022-05-17 DIAGNOSIS — Z8616 Personal history of COVID-19: Secondary | ICD-10-CM | POA: Diagnosis not present

## 2022-05-17 DIAGNOSIS — M25561 Pain in right knee: Secondary | ICD-10-CM

## 2022-05-17 DIAGNOSIS — E1159 Type 2 diabetes mellitus with other circulatory complications: Secondary | ICD-10-CM | POA: Diagnosis not present

## 2022-05-17 DIAGNOSIS — I509 Heart failure, unspecified: Secondary | ICD-10-CM | POA: Insufficient documentation

## 2022-05-17 DIAGNOSIS — E1151 Type 2 diabetes mellitus with diabetic peripheral angiopathy without gangrene: Secondary | ICD-10-CM | POA: Diagnosis not present

## 2022-05-17 DIAGNOSIS — E1142 Type 2 diabetes mellitus with diabetic polyneuropathy: Secondary | ICD-10-CM

## 2022-05-17 DIAGNOSIS — I4891 Unspecified atrial fibrillation: Secondary | ICD-10-CM | POA: Insufficient documentation

## 2022-05-17 DIAGNOSIS — Z7901 Long term (current) use of anticoagulants: Secondary | ICD-10-CM | POA: Insufficient documentation

## 2022-05-17 DIAGNOSIS — E118 Type 2 diabetes mellitus with unspecified complications: Secondary | ICD-10-CM

## 2022-05-17 DIAGNOSIS — Z1211 Encounter for screening for malignant neoplasm of colon: Secondary | ICD-10-CM

## 2022-05-17 DIAGNOSIS — I480 Paroxysmal atrial fibrillation: Secondary | ICD-10-CM

## 2022-05-17 LAB — POCT GLYCOSYLATED HEMOGLOBIN (HGB A1C): HbA1c, POC (controlled diabetic range): 9.1 % — AB (ref 0.0–7.0)

## 2022-05-17 LAB — GLUCOSE, POCT (MANUAL RESULT ENTRY): POC Glucose: 239 mg/dl — AB (ref 70–99)

## 2022-05-17 MED ORDER — AMLODIPINE BESYLATE 10 MG PO TABS
10.0000 mg | ORAL_TABLET | Freq: Every day | ORAL | 1 refills | Status: DC
  Filled 2022-05-17 – 2022-05-26 (×2): qty 90, 90d supply, fill #0
  Filled 2022-08-23: qty 90, 90d supply, fill #1

## 2022-05-17 MED ORDER — GLIPIZIDE 5 MG PO TABS
2.5000 mg | ORAL_TABLET | Freq: Two times a day (BID) | ORAL | 1 refills | Status: DC
  Filled 2022-05-17: qty 90, 90d supply, fill #0
  Filled 2022-08-23: qty 90, 90d supply, fill #1

## 2022-05-17 MED ORDER — RIVAROXABAN 20 MG PO TABS
20.0000 mg | ORAL_TABLET | Freq: Every day | ORAL | 1 refills | Status: DC
  Filled 2022-05-17 – 2022-05-26 (×2): qty 90, 90d supply, fill #0
  Filled 2022-08-23: qty 90, 90d supply, fill #1

## 2022-05-17 MED ORDER — DAPAGLIFLOZIN PROPANEDIOL 10 MG PO TABS
10.0000 mg | ORAL_TABLET | Freq: Every day | ORAL | 1 refills | Status: DC
  Filled 2022-05-17 – 2022-06-01 (×2): qty 90, 90d supply, fill #0
  Filled 2022-08-23: qty 90, 90d supply, fill #1

## 2022-05-17 MED ORDER — PREDNISONE 20 MG PO TABS
20.0000 mg | ORAL_TABLET | Freq: Every day | ORAL | 0 refills | Status: DC
  Filled 2022-05-17: qty 5, 5d supply, fill #0

## 2022-05-17 MED ORDER — GABAPENTIN 300 MG PO CAPS
300.0000 mg | ORAL_CAPSULE | Freq: Two times a day (BID) | ORAL | 1 refills | Status: DC
  Filled 2022-05-17 – 2022-05-26 (×2): qty 180, 90d supply, fill #0
  Filled 2022-08-23: qty 180, 90d supply, fill #1

## 2022-05-17 MED ORDER — TORSEMIDE 20 MG PO TABS
20.0000 mg | ORAL_TABLET | Freq: Two times a day (BID) | ORAL | 1 refills | Status: DC
  Filled 2022-05-17 – 2022-06-01 (×2): qty 180, 90d supply, fill #0

## 2022-05-17 MED ORDER — ATORVASTATIN CALCIUM 20 MG PO TABS
ORAL_TABLET | Freq: Every day | ORAL | 1 refills | Status: DC
  Filled 2022-05-17: qty 90, fill #0
  Filled 2022-05-26: qty 90, 90d supply, fill #0
  Filled 2022-08-23: qty 90, 90d supply, fill #1

## 2022-05-17 NOTE — Patient Instructions (Signed)
Acute Knee Pain, Adult Acute knee pain is sudden and may be caused by damage, swelling, or irritation of the muscles and tissues that support the knee. Pain may result from: A fall. An injury to the knee from twisting motions. A hit to the knee. Infection. Acute knee pain may go away on its own with time and rest. If it does not, your health care provider may order tests to find the cause of the pain. These may include: Imaging tests, such as an X-ray, MRI, CT scan, or ultrasound. Joint aspiration. In this test, fluid is removed from the knee and evaluated. Arthroscopy. In this test, a lighted tube is inserted into the knee and an image is projected onto a TV screen. Biopsy. In this test, a sample of tissue is removed from the body and studied under a microscope. Follow these instructions at home: If you have a knee sleeve or brace:  Wear the knee sleeve or brace as told by your health care provider. Remove it only as told by your health care provider. Loosen it if your toes tingle, become numb, or turn cold and blue. Keep it clean. If the knee sleeve or brace is not waterproof: Do not let it get wet. Cover it with a watertight covering when you take a bath or shower. Activity Rest your knee. Do not do things that cause pain or make pain worse. Avoid high-impact activities or exercises, such as running, jumping rope, or doing jumping jacks. Work with a physical therapist to make a safe exercise program, as recommended by your health care provider. Do exercises as told by your physical therapist. Managing pain, stiffness, and swelling  If directed, put ice on the affected knee. To do this: If you have a removable knee sleeve or brace, remove it as told by your health care provider. Put ice in a plastic bag. Place a towel between your skin and the bag. Leave the ice on for 20 minutes, 2-3 times a day. Remove the ice if your skin turns bright red. This is very important. If you cannot  feel pain, heat, or cold, you have a greater risk of damage to the area. If directed, use an elastic bandage to put pressure (compression) on your injured knee. This may control swelling, give support, and help with discomfort. Raise (elevate) your knee above the level of your heart while you are sitting or lying down. Sleep with a pillow under your knee. General instructions Take over-the-counter and prescription medicines only as told by your health care provider. Do not use any products that contain nicotine or tobacco, such as cigarettes, e-cigarettes, and chewing tobacco. If you need help quitting, ask your health care provider. If you are overweight, work with your health care provider and a dietitian to set a weight-loss goal that is healthy and reasonable for you. Extra weight can put pressure on your knee. Pay attention to any changes in your symptoms. Keep all follow-up visits. This is important. Contact a health care provider if: Your knee pain continues, changes, or gets worse. You have a fever along with knee pain. Your knee feels warm to the touch or is red. Your knee buckles or locks up. Get help right away if: Your knee swells, and the swelling becomes worse. You cannot move your knee. You have severe pain in your knee that cannot be managed with pain medicine. Summary Acute knee pain can be caused by a fall, an injury, an infection, or damage, swelling, or irritation   of the tissues that support your knee. Your health care provider may perform tests to find out the cause of the pain. Pay attention to any changes in your symptoms. Relieve your pain with rest, medicines, light activity, and the use of ice. Get help right away if your knee swells, you cannot move your knee, or you have severe pain that cannot be managed with medicine. This information is not intended to replace advice given to you by your health care provider. Make sure you discuss any questions you have with  your health care provider. Document Revised: 02/12/2020 Document Reviewed: 02/12/2020 Elsevier Patient Education  2023 [employer] Inc.  

## 2022-05-17 NOTE — Progress Notes (Signed)
Right knee pain for 2 weeks.

## 2022-05-17 NOTE — Progress Notes (Signed)
Subjective:  Patient ID: Paul Mclaughlin, male    DOB: 07-09-1956  Age: 66 y.o. MRN: [id number]  CC: Diabetes   HPI Paul Mclaughlin is a 66 y.o. year old male with a history of type 2 diabetes mellitus (A1c 9.1), hypertension, atrial fibrillation (on anticoagulation with Xarelto and rate control with metoprolol), history of Congenital heart disease status post surgery, Pulmonary hypertension, congestive heart failure ( EF of 65-70% from 2-D echo 01/2021), peripheral arterial disease , liver cirrhosis, COVID-19 pneumonitis  Interval History:  He Complains of right knee pain x2 weeks and 1 month prior to that but symptoms had improved prior to 2 weeks ago. He had bent to put air in his tire 2 weeks ago but did not twist his knee in the process. He is unable to fully extend his right knee and has he has been lying in bed to reduce symptoms. Pain is in lateral aspect of right knee and he denies presence of swelling. He now has to ambulate with the aid of a walker.  He quit Metformin due to GI adverse effect. He quit Glipizide because "it messes with his eyes".  He has been taking just [place]. He states he has been eating a lot of Ice cream due to the hot weather. A1c is 9.1 up from 6.5 previously. Denies presence visual concerns and his neuropathy is controlled.  From a cardiac standpoint he denies dyspnea or pedal edema and endorses adherence with his medications but has not been adherent with a cardiology follow-up. Past Medical History:  Diagnosis Date   Atrial fibrillation ([place])    Cellulitis and abscess of left leg 06/2016   CHF (congestive heart failure) ([place])    Diabetes ([place])    Dyspnea     Past Surgical History:  Procedure Laterality Date   AMPUTATION Left 06/24/2016   Procedure: FOOT FIFTH RAY TOE AMPUTATION;  Surgeon: Newt Minion, MD;  Location: [facility];  Service: Orthopedics;  Laterality: Left;   IR PARACENTESIS  11/12/2020   IR PARACENTESIS  11/13/2020   IR PARACENTESIS   11/16/2020   open heart surgery     As a child.  65 years old.  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    RIGHT HEART CATH N/A 02/17/2021   Procedure: RIGHT HEART CATH;  Surgeon: Larey Dresser, MD;  Location: [facility];  Service: Cardiovascular;  Laterality: N/A;   RIGHT/LEFT HEART CATH AND CORONARY ANGIOGRAPHY N/A 03/30/2017   Procedure: Right/Left Heart Cath and Coronary Angiography;  Surgeon: Larey Dresser, MD;  Location: [facility];  Service: Cardiovascular;  Laterality: N/A;    Family History  Problem Relation Age of Onset   Diabetes Mellitus II Mother        ESRD   Diabetes Mellitus II Brother    Emphysema Father     Social History   Socioeconomic History   Marital status: Divorced    Spouse name: Not on file   Number of children: 1   Years of education: Not on file   Highest education level: Not on file  Occupational History   Not on file  Tobacco Use   Smoking status: Never   Smokeless tobacco: Never  Vaping Use   Vaping Use: Never used  Substance and Sexual Activity   Alcohol use: No    Comment: gave up drinking ~20 years ago   Drug use: Yes    Types: Marijuana    Comment: daily   Sexual activity: Not on file  Other Topics Concern   Not on file  Social History Narrative   Lives alone    Social Determinants of Health   Financial Resource Strain: Not on file  Food Insecurity: Not on file  Transportation Needs: Not on file  Physical Activity: Not on file  Stress: Not on file  Social Connections: Not on file    Allergies  Allergen Reactions   Tadalafil Other (See Comments)    Patient experiences a head ache with this medication.    Outpatient Medications Prior to Visit  Medication Sig Dispense Refill   albuterol (PROVENTIL HFA;VENTOLIN HFA) 108 (90 Base) MCG/ACT inhaler Inhale 2 puffs into the lungs every 6 (six) hours as needed for wheezing or shortness of breath. 1 Inhaler 0   Misc. Devices MISC Blood pressure monitor.  Diagnosis- hypertension 1 each 0    spironolactone (ALDACTONE) 25 MG tablet Take 1 tablet (25 mg total) by mouth daily. NEEDS FOLLOW UP APPOINTMENT FOR ANYMORE REFILLS 90 tablet 0   amLODipine (NORVASC) 10 MG tablet Take 1 tablet (10 mg total) by mouth daily. 30 tablet 1   atorvastatin (LIPITOR) 20 MG tablet TAKE 1 TABLET (20 MG TOTAL) BY MOUTH DAILY. 30 tablet 1   dapagliflozin propanediol (FARXIGA) 10 MG TABS tablet Take 1 tablet (10 mg total) by mouth daily before breakfast. 30 tablet 0   gabapentin (NEURONTIN) 300 MG capsule Take 1 capsule (300 mg total) by mouth 2 (two) times daily. 60 capsule 3   glipiZIDE (GLUCOTROL) 5 MG tablet Take 0.5 tablets (2.5 mg total) by mouth 2 (two) times daily before a meal. 60 tablet 6   metFORMIN (GLUCOPHAGE) 500 MG tablet Take 1 tablet (500 mg total) by mouth daily with breakfast. 90 tablet 1   rivaroxaban (XARELTO) 20 MG TABS tablet Take 1 tablet (20 mg total) by mouth once daily with supper. (Needs PCP office visit for more refills). 30 tablet 1   torsemide (DEMADEX) 20 MG tablet Take 1 tablet (20 mg total) by mouth 2 (two) times daily. 60 tablet 0   No facility-administered medications prior to visit.     ROS Review of Systems  Constitutional:  Negative for activity change and appetite change.  HENT:  Negative for sinus pressure and sore throat.   Respiratory:  Negative for chest tightness, shortness of breath and wheezing.   Cardiovascular:  Negative for chest pain and palpitations.  Gastrointestinal:  Negative for abdominal distention, abdominal pain and constipation.  Genitourinary: Negative.   Musculoskeletal:        See HPI  Psychiatric/Behavioral:  Negative for behavioral problems and dysphoric mood.     Objective:  BP (!) 160/79   Pulse (!) 107   Temp 98.5 F (36.9 C) (Oral)   Ht _0  (1.854 m)   Wt 229 lb (103.9 kg)   SpO2 98%   BMI 30.21 kg/m      05/17/2022    2:59 PM 09/29/2021    2:43 PM 09/29/2021    1:53 PM  BP/Weight  Systolic BP 496 759 163   Diastolic BP 79 65 71  Wt. (Lbs) 229  232.4  BMI 30.21 kg/m2  30.66 kg/m2      Physical Exam Constitutional:      Appearance: He is well-developed.  Cardiovascular:     Rate and Rhythm: Tachycardia present.     Heart sounds: Normal heart sounds. No murmur heard. Pulmonary:     Effort: Pulmonary effort is normal.     Breath sounds: Normal breath  sounds. No wheezing or rales.  Chest:     Chest wall: No tenderness.  Abdominal:     General: Bowel sounds are normal. There is no distension.     Palpations: Abdomen is soft. There is no mass.     Tenderness: There is no abdominal tenderness.  Musculoskeletal:     Right lower leg: No edema.     Left lower leg: No edema.     Comments: Normal appearance of knee Tenderness on palpation of the lateral joint line of right knee Unable to fully extend right knee  Left knee is normal  Neurological:     Mental Status: He is alert and oriented to person, place, and time.  Psychiatric:        Mood and Affect: Mood normal.        Latest Ref Rng & Units 09/29/2021    2:53 PM 06/29/2021    4:20 PM 03/30/2021    3:31 PM  CMP  Glucose 70 - 99 mg/dL 178  189  174   BUN 8 - 27 mg/dL 37  35  34   Creatinine 0.76 - 1.27 mg/dL 1.42  1.31  1.36   Sodium 134 - 144 mmol/L 138  138  133   Potassium 3.5 - 5.2 mmol/L 4.3  4.4  4.7   Chloride 96 - 106 mmol/L 97  100  101   CO2 20 - 29 mmol/L _0 Calcium 8.6 - 10.2 mg/dL 9.8  9.5  9.4   Total Protein 6.0 - 8.5 g/dL 7.8  8.1    Total Bilirubin 0.0 - 1.2 mg/dL 1.0  1.2    Alkaline Phos 44 - 121 IU/L 157  140    AST 0 - 40 IU/L 23  23    ALT 0 - 44 IU/L 25  26      Lipid Panel     Component Value Date/Time   CHOL 103 06/29/2021 1620   TRIG 70 06/29/2021 1620   HDL 46 06/29/2021 1620   CHOLHDL 3.6 12/31/2019 1520   CHOLHDL 2.3 10/03/2017 1457   VLDL 14 10/03/2017 1457   LDLCALC 42 06/29/2021 1620    CBC    Component Value Date/Time   WBC 8.2 09/29/2021 1453   WBC 4.7  02/17/2021 1125   RBC 4.38 09/29/2021 1453   RBC 3.91 (L) 02/17/2021 1125   HGB 14.1 09/29/2021 1453   HCT 41.5 09/29/2021 1453   PLT 274 09/29/2021 1453   MCV 95 09/29/2021 1453   MCH 32.2 09/29/2021 1453   MCH 28.9 02/17/2021 1125   MCHC 34.0 09/29/2021 1453   MCHC 32.4 02/17/2021 1125   RDW 12.3 09/29/2021 1453   LYMPHSABS 1.2 09/29/2021 1453   MONOABS 0.4 11/18/2020 0120   EOSABS 0.4 09/29/2021 1453   BASOSABS 0.1 09/29/2021 1453    Lab Results  Component Value Date   HGBA1C 9.1 (A) 05/17/2022    Assessment & Plan:  1. Type 2 diabetes mellitus with complication, without long-term current use of insulin (HCC) Uncontrolled with A1c of 9.1 up from 6.5 previously Advised to restart glipizide Discontinue metformin due to GI adverse effects He is willing to work on his diet We will reassess at next visit and if still above goal consider GLP-1 RA Counseled on Diabetic diet, my plate method, 161 minutes of moderate intensity exercise/week Blood sugar logs with fasting goals of 80-120 mg/dl, random of less than 180 and in the event of sugars  less than 60 mg/dl or greater than 400 mg/dl encouraged to notify the clinic. Advised on the need for annual eye exams, annual foot exams, Pneumonia vaccine. - POCT glycosylated hemoglobin (Hb A1C) - POCT glucose (manual entry) - Microalbumin / creatinine urine ratio - CMP14+EGFR - atorvastatin (LIPITOR) 20 MG tablet; TAKE 1 TABLET (20 MG TOTAL) BY MOUTH DAILY.  Dispense: 90 tablet; Refill: 1 - glipiZIDE (GLUCOTROL) 5 MG tablet; Take 0.5 tablets (2.5 mg total) by mouth 2 (two) times daily before a meal.  Dispense: 90 tablet; Refill: 1 - dapagliflozin propanediol (FARXIGA) 10 MG TABS tablet; Take 1 tablet (10 mg total) by mouth daily before breakfast.  Dispense: 90 tablet; Refill: 1  2. Acute pain of right knee Short course of prednisone Might need cortisone injection but will order knee x-ray first - DG Knee Complete 4 Views Right;  Future  3. Screening for colon cancer - Cologuard  4. Hypertension associated with diabetes ([place]) Controlled Counseled on blood pressure goal of less than 130/80, low-sodium, DASH diet, medication compliance, 150 minutes of moderate intensity exercise per week. Discussed medication compliance, adverse effects. - amLODipine (NORVASC) 10 MG tablet; Take 1 tablet (10 mg total) by mouth daily.  Dispense: 90 tablet; Refill: 1  5. Diabetic polyneuropathy associated with type 2 diabetes mellitus (HCC) Stable - gabapentin (NEURONTIN) 300 MG capsule; Take 1 capsule (300 mg total) by mouth 2 (two) times daily.  Dispense: 180 capsule; Refill: 1  6. Paroxysmal atrial fibrillation (HCC) Currently in sinus rhythm - rivaroxaban (XARELTO) 20 MG TABS tablet; Take 1 tablet (20 mg total) by mouth once daily with supper. (Needs PCP office visit for more refills).  Dispense: 90 tablet; Refill: 1  7. Hypertensive heart disease with chronic combined systolic and diastolic congestive heart failure (HCC) EF 65-70% Euvolemic Advised to schedule follow-up with cardiology    Meds ordered this encounter  Medications   predniSONE (DELTASONE) 20 MG tablet    Sig: Take 1 tablet (20 mg total) by mouth daily with breakfast.    Dispense:  5 tablet    Refill:  0   atorvastatin (LIPITOR) 20 MG tablet    Sig: TAKE 1 TABLET (20 MG TOTAL) BY MOUTH DAILY.    Dispense:  90 tablet    Refill:  1   amLODipine (NORVASC) 10 MG tablet    Sig: Take 1 tablet (10 mg total) by mouth daily.    Dispense:  90 tablet    Refill:  1   gabapentin (NEURONTIN) 300 MG capsule    Sig: Take 1 capsule (300 mg total) by mouth 2 (two) times daily.    Dispense:  180 capsule    Refill:  1   glipiZIDE (GLUCOTROL) 5 MG tablet    Sig: Take 0.5 tablets (2.5 mg total) by mouth 2 (two) times daily before a meal.    Dispense:  90 tablet    Refill:  1   rivaroxaban (XARELTO) 20 MG TABS tablet    Sig: Take 1 tablet (20 mg total) by mouth once  daily with supper. (Needs PCP office visit for more refills).    Dispense:  90 tablet    Refill:  1   torsemide (DEMADEX) 20 MG tablet    Sig: Take 1 tablet (20 mg total) by mouth 2 (two) times daily.    Dispense:  180 tablet    Refill:  1   dapagliflozin propanediol (FARXIGA) 10 MG TABS tablet    Sig: Take 1 tablet (10 mg total)  by mouth daily before breakfast.    Dispense:  90 tablet    Refill:  1    Follow-up: Return in about 3 months (around 08/16/2022) for Diabetes follow-up.       Charlott Rakes, MD, FAAFP. [facility] and Jessie Severy, [place]   05/17/2022, 5:47 PM

## 2022-05-18 ENCOUNTER — Other Ambulatory Visit: Payer: Self-pay

## 2022-05-18 ENCOUNTER — Other Ambulatory Visit: Payer: Self-pay | Admitting: Family Medicine

## 2022-05-18 DIAGNOSIS — R7989 Other specified abnormal findings of blood chemistry: Secondary | ICD-10-CM

## 2022-05-18 LAB — CMP14+EGFR
ALT: 11 IU/L (ref 0–44)
AST: 15 IU/L (ref 0–40)
Albumin/Globulin Ratio: 1.5 (ref 1.2–2.2)
Albumin: 4.9 g/dL (ref 3.9–4.9)
Alkaline Phosphatase: 153 IU/L — ABNORMAL HIGH (ref 44–121)
BUN/Creatinine Ratio: 27 — ABNORMAL HIGH (ref 10–24)
BUN: 45 mg/dL — ABNORMAL HIGH (ref 8–27)
Bilirubin Total: 2.2 mg/dL — ABNORMAL HIGH (ref 0.0–1.2)
CO2: 22 mmol/L (ref 20–29)
Calcium: 10.1 mg/dL (ref 8.6–10.2)
Chloride: 89 mmol/L — ABNORMAL LOW (ref 96–106)
Creatinine, Ser: 1.69 mg/dL — ABNORMAL HIGH (ref 0.76–1.27)
Globulin, Total: 3.2 g/dL (ref 1.5–4.5)
Glucose: 234 mg/dL — ABNORMAL HIGH (ref 70–99)
Potassium: 4.4 mmol/L (ref 3.5–5.2)
Sodium: 133 mmol/L — ABNORMAL LOW (ref 134–144)
Total Protein: 8.1 g/dL (ref 6.0–8.5)
eGFR: 44 mL/min/{1.73_m2} — ABNORMAL LOW (ref >59)

## 2022-05-26 ENCOUNTER — Other Ambulatory Visit: Payer: Self-pay

## 2022-05-27 ENCOUNTER — Other Ambulatory Visit: Payer: Self-pay

## 2022-06-01 ENCOUNTER — Other Ambulatory Visit: Payer: Self-pay

## 2022-06-20 ENCOUNTER — Ambulatory Visit: Payer: Medicare Other | Attending: Family Medicine

## 2022-06-20 DIAGNOSIS — R7989 Other specified abnormal findings of blood chemistry: Secondary | ICD-10-CM

## 2022-06-21 LAB — CMP14+EGFR
ALT: 14 IU/L (ref 0–44)
AST: 15 IU/L (ref 0–40)
Albumin/Globulin Ratio: 1.7 (ref 1.2–2.2)
Albumin: 4.7 g/dL (ref 3.9–4.9)
Alkaline Phosphatase: 126 IU/L — ABNORMAL HIGH (ref 44–121)
BUN/Creatinine Ratio: 21 (ref 10–24)
BUN: 28 mg/dL — ABNORMAL HIGH (ref 8–27)
Bilirubin Total: 0.8 mg/dL (ref 0.0–1.2)
CO2: 22 mmol/L (ref 20–29)
Calcium: 9.6 mg/dL (ref 8.6–10.2)
Chloride: 100 mmol/L (ref 96–106)
Creatinine, Ser: 1.33 mg/dL — ABNORMAL HIGH (ref 0.76–1.27)
Globulin, Total: 2.8 g/dL (ref 1.5–4.5)
Glucose: 221 mg/dL — ABNORMAL HIGH (ref 70–99)
Potassium: 4.4 mmol/L (ref 3.5–5.2)
Sodium: 138 mmol/L (ref 134–144)
Total Protein: 7.5 g/dL (ref 6.0–8.5)
eGFR: 59 mL/min/{1.73_m2} — ABNORMAL LOW (ref >59)

## 2022-06-30 ENCOUNTER — Other Ambulatory Visit: Payer: Self-pay

## 2022-06-30 ENCOUNTER — Ambulatory Visit (HOSPITAL_COMMUNITY)
Admission: RE | Admit: 2022-06-30 | Discharge: 2022-06-30 | Disposition: A | Discharge status: Home / Self Care | Payer: Medicare Other | Source: Ambulatory Visit | Attending: Cardiology | Admitting: Cardiology

## 2022-06-30 VITALS — BP 150/90 | HR 67 | Wt 248.0 lb

## 2022-06-30 DIAGNOSIS — I272 Pulmonary hypertension, unspecified: Secondary | ICD-10-CM

## 2022-06-30 DIAGNOSIS — I5032 Chronic diastolic (congestive) heart failure: Secondary | ICD-10-CM | POA: Diagnosis not present

## 2022-06-30 DIAGNOSIS — I5033 Acute on chronic diastolic (congestive) heart failure: Secondary | ICD-10-CM | POA: Insufficient documentation

## 2022-06-30 MED ORDER — SPIRONOLACTONE 25 MG PO TABS
50.0000 mg | ORAL_TABLET | Freq: Every day | ORAL | 3 refills | Status: DC
  Filled 2022-06-30: qty 180, 90d supply, fill #0
  Filled 2022-09-28 (×2): qty 180, 90d supply, fill #1
  Filled 2022-12-30: qty 180, 90d supply, fill #2
  Filled 2023-03-30: qty 180, 90d supply, fill #3

## 2022-06-30 NOTE — Patient Instructions (Addendum)
Increase Spironolactone to 50mg  daily.  Blood work in 10 days.  Repeat blood work in 3 months ( January 2024) ** please call in Late December or Early January to arrange this appointment   Your physician has requested that you have an echocardiogram. Echocardiography is a painless test that uses sound waves to create images of your heart. It provides your doctor with information about the size and shape of your heart and how well your heart's chambers and valves are working. This procedure takes approximately one hour. There are no restrictions for this procedure. Please do NOT wear cologne, perfume, aftershave, or lotions (deodorant is allowed). Please arrive 15 minutes prior to your appointment time.  Your physician recommends that you schedule a follow-up appointment in: 6 months (April 2024)  ** please call the office in February to arrange your follow up appointment   If you have any questions or concerns before your next appointment please send [place] a message through [place] or call our office at [phone].    TO LEAVE A MESSAGE FOR THE NURSE SELECT OPTION 2, PLEASE LEAVE A MESSAGE INCLUDING: YOUR NAME DATE OF BIRTH CALL BACK NUMBER REASON FOR CALL**this is important as we prioritize the call backs  YOU WILL RECEIVE A CALL BACK THE SAME DAY AS LONG AS YOU CALL BEFORE 4:00 PM  At the [facility], you and your health needs are our priority. As part of our continuing mission to provide you with exceptional heart care, we have created designated Provider Care Teams. These Care Teams include your primary Cardiologist (physician) and Advanced Practice Providers (APPs- Physician Assistants and Nurse Practitioners) who all work together to provide you with the care you need, when you need it.   You may see any of the following providers on your designated Care Team at your next follow up: Dr Glori Bickers Dr Loralie Champagne Dr. Roxana Hires, NP Lyda Jester, [place] [facility] [place], [place] Forestine Na, NP Audry Riles, PharmD   Please be sure to bring in all your medications bottles to every appointment.

## 2022-07-02 NOTE — Progress Notes (Signed)
Advanced Heart Failure Clinic Note   PCP: Charlott Rakes, MD HF Cardiology: Dr. Aundra Dubin   HPI: [name] is a 66 y.o. with a past medical history of chronic diastolic CHF, atrial fibrillation, DM. He was seen in Dr. Rosezella [place] office on 03/16/17 and set up for a right and left heart cath with concerns for type I pulmonary HTN.   He was admitted in 7/18 after right and left heart cath with elevated filling pressures and severe pulmonary HTN. Results below, predominantly pulmonary venous HTN with PVR 3.1 WU. Also with 75% stenosis in the distal LAD. He was started on IV diuresis and diuresed 34 pounds total. Transitioned to po torsemide 60 mg daily (had previously been on Lasix) at home. During his hospitalization he was noted to have transient, asymptomatic episodes of bradycardia. His metoprolol was reduced to 25 mg BID. Discharge weight was 276 pounds.   He was unfortunately lost to f/u in the [facility] from 2019 to 2022.   He was admitted by [facility] from 3/1-11/18/20 w/ acute hypoxic respiratory failure due to a/c diastolic heart failure w/ prominent RV dysfunction w/ massive anasarca/ ascites requiring paracentesis and IV Lasix. Also w/ viral pneumonitis  in the setting of acute COVID infection, treated w/ steroids and Remdesivir. 2D echo showed normal LVEF at 55%. RV noted to be severely enlarged w/ moderately reduced systolic function and severely elevated pulmonary artery systolic pressure, 0000000 mmHg w/ D-shaped  interventricular septum suggestive of RV pressure/volume overload.  V/Q scan was negative for PE. He was diuresed w/ IV Lasix down to 276 lb w/ significant symptomatic improvement. He was transitioned back to PO torsemide at 40 mg bid and instructed to f/u in the [facility].   Had f/u w/ Dr. Aundra Dubin 6/22 and torsemide further increased to 60 mg daily. RHC was also done 6/22 showing normal filling pressures and moderate pulmonary hypertension w/ with PVR 3.1 WU. 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 He was placed on a trial of Tadalafil but unfortunately did not tolerate due to severe HAs and self-discontinued. Also of note, viral hepatis panel was negative.   He returns for followup of CHF and pulmonary hypertension.  Weight is up 15 lbs.  BP is elevated.  Main complaint is right knee pain from arthritis, he is not very active.  No dyspnea walking into the office today.  No chest pain.  No BPRPR/melena.  No lightheadedness.   ECG: atrial fibrillation, iRBBB  Labs (3/22): K 3.9, creatinine 1.33 Labs (6/22): Scr 1.56, K 4.3 Labs (6/22): SCr 2.80, K 3.9  Labs (7/22): SCr 1.81, K 4.3  Labs (10/23): K 4.4, creatinine 1.33  PMH: 1. Atrial fibrillation: Permanent.  2. CKD stage 3 3. Cirrhosis: Abdominal US (3/22) with cirrhosis.  4. Chronic diastolic CHF with prominent RV dysfunction: Echo in (3/22) with EF 55%, severely dilated RV with moderately decreased systolic function, PASP 74 mmHg, D-shaped septum, dilated IVC - LHC/RHC (7/18) with 75% distal LAD stenosis (small caliber distal vessel); mean RA 23, PA 94/35, mean 31, CI 3.09 - V/Q scan 3/22: No evidence for chronic PE.  - Echo (5/22): EF 65-70%, Mild LVH, moderately decreased RV systolic function, moderate biatrial enlargement, PASP 70 mmHg.  - RHC (6/22): mean RA 5, PA 62/23 mean 36, mean PCWP 13, CI 3.3, PVR 3.1 WU 5. PFTs (2018): Moderate-severe obstruction.  6. CAD: LHC (  7/18) with 75% distal LAD stenosis (small caliber distal vessel) 7. HTN 8. Osteoarthritis   Current Outpatient Medications  Medication Sig Dispense Refill   albuterol (PROVENTIL HFA;VENTOLIN HFA) 108 (90 Base) MCG/ACT inhaler Inhale 2 puffs into the lungs every 6 (six) hours as needed for wheezing or shortness of breath. 1 Inhaler 0   amLODipine (NORVASC) 10 MG tablet Take 1 tablet (10 mg total) by mouth daily. 90 tablet 1    atorvastatin (LIPITOR) 20 MG tablet TAKE 1 TABLET (20 MG TOTAL) BY MOUTH DAILY. 90 tablet 1   dapagliflozin propanediol (FARXIGA) 10 MG TABS tablet Take 1 tablet (10 mg total) by mouth daily before breakfast. 90 tablet 1   gabapentin (NEURONTIN) 300 MG capsule Take 1 capsule (300 mg total) by mouth 2 (two) times daily. 180 capsule 1   glipiZIDE (GLUCOTROL) 5 MG tablet Take 0.5 tablets (2.5 mg total) by mouth 2 (two) times daily before a meal. 90 tablet 1   Misc. Devices MISC Blood pressure monitor.  Diagnosis- hypertension 1 each 0   predniSONE (DELTASONE) 20 MG tablet Take 1 tablet (20 mg total) by mouth daily with breakfast. 5 tablet 0   rivaroxaban (XARELTO) 20 MG TABS tablet Take 1 tablet (20 mg total) by mouth once daily with supper. (Needs PCP office visit for more refills). 90 tablet 1   torsemide (DEMADEX) 20 MG tablet Take 1 tablet (20 mg total) by mouth 2 (two) times daily. 180 tablet 1   spironolactone (ALDACTONE) 25 MG tablet Take 2 tablets (50 mg total) by mouth daily. 180 tablet 3   No current facility-administered medications for this encounter.   Allergies  Allergen Reactions   Tadalafil Other (See Comments)    Patient experiences a head ache with this medication.    Social History   Socioeconomic History   Marital status: Divorced    Spouse name: Not on file   Number of children: 1   Years of education: Not on file   Highest education level: Not on file  Occupational History   Not on file  Tobacco Use   Smoking status: Never   Smokeless tobacco: Never  Vaping Use   Vaping Use: Never used  Substance and Sexual Activity   Alcohol use: No    Comment: gave up drinking ~20 years ago   Drug use: Yes    Types: Marijuana    Comment: daily   Sexual activity: Not on file  Other Topics Concern   Not on file  Social History Narrative   Lives alone    Social Determinants of Health   Financial Resource Strain: Not on file  Food Insecurity: Not on file   Transportation Needs: Not on file  Physical Activity: Not on file  Stress: Not on file  Social Connections: Not on file  Intimate Partner Violence: Not on file    Family History  Problem Relation Age of Onset   Diabetes Mellitus II Mother        ESRD   Diabetes Mellitus II Brother    Emphysema Father    Vitals:   06/30/22 1434  BP: (!) 150/90  Pulse: 67  SpO2: 96%  Weight: 112.5 kg (248 lb)   Wt Readings from Last 3 Encounters:  06/30/22 112.5 kg (248 lb)  05/17/22 103.9 kg (229 lb)  09/29/21 105.4 kg (232 lb 6.4 oz)    PHYSICAL EXAM: General: NAD Neck: No JVD, no thyromegaly or thyroid nodule.  Lungs: Clear to auscultation bilaterally with normal respiratory  effort. CV: Nondisplaced PMI.  Heart irregular S1/S2, no S3/S4, no murmur.  No peripheral edema.  No carotid bruit.  Normal pedal pulses.  Abdomen: Soft, nontender, no hepatosplenomegaly, no distention.  Skin: Intact without lesions or rashes.  Neurologic: Alert and oriented x 3.  Psych: Normal affect. Extremities: No clubbing or cyanosis.  HEENT: Normal.   ASSESSMENT & PLAN:  1. Pulmonary hypertension: Primarily pulmonary venous hypertension, WHO Group 2, on RHC in 7/18.  No role at that time for selective pulmonary vasodilators. PFTs in 2018 c/w moderate-severe obstructive airway disease. V/Q scan negative in 3/22.  Significant RV dysfunction by echo, PASP estimate on 5/22 echo was 70 mmHg.  [place] 6/22 showed normal filling pressures, moderate pulmonary hypertension with PVR 3.1 WU. 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 He was placed on trial of tadalafil but did not tolerate due to severe HAs. Viral hepatitis panel negative - refuses retrial of tadalafil  - refuses sleep study  - I will arrange for repeat echo.  If RV is abnormal or PA pressure estimate is elevated, I would  recommend repeat RHC to reassess [place] as he is currently not on selective pulmonary vasodilators.  2. Chronic diastolic HF w/ Prominent RV Failure: Echo in 3/18 with LV EF 60-65%, RV severely dilated with moderate to severely decreased systolic function. Filling pressures markedly elevated on RHC in 2018. Admission in 3/22 with RV failure w/ marked fluid overload and anasarca/ ascites requiring paracentesis.  Most recent echo in 5/22 showed EF 65-70%, Mild LVH, moderately decreased RV systolic function, moderate biatrial enlargement, PASP 70 mmHg. Recent RHC per above. NYHA class II.  Though weight is up, he does not appear volume overloaded.  - Continue torsemide 20 mg bid.  - Increase spironolactone to 50 mg daily.  BMET/BNP today and BMET in 10 days.   - Continue dapagliflozin 10 mg daily.  3. CAD: 75% distal LAD lesion on 7/18 cath. Denies chest pain  - Continue atorvastatin, check lipids today.   - No ASA with need for Xarelto and stable CAD 4. Afib: Permanent. Good rate control.  - Continue Xarelto for anticoagulation.  5. Congenital Heart Disease:  Underwent surgical repair at age 56. He is not sure what type of congenital issue he had. 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  [place] in 7/18 did not suggest residual defect (no evidence for significant shunt).  6. HTN: BP elevated.  - Increase spironolactone as above.  7. CKD: Stage 3.  - check BMP today  8. Cirrhosis: Cause uncertain.  He is not drinking ETOH.  Viral hepatitis panel negative.   Followup in 6 months with APP if echo does not show significant RV dysfunction.   Loralie Champagne 07/02/2022\

## 2022-07-11 ENCOUNTER — Other Ambulatory Visit (HOSPITAL_COMMUNITY): Payer: Medicare Other

## 2022-07-15 ENCOUNTER — Ambulatory Visit (HOSPITAL_COMMUNITY)
Admission: RE | Admit: 2022-07-15 | Discharge: 2022-07-15 | Disposition: A | Discharge status: Home / Self Care | Payer: Medicare Other | Source: Ambulatory Visit | Attending: Cardiology | Admitting: Cardiology

## 2022-07-15 ENCOUNTER — Ambulatory Visit (HOSPITAL_COMMUNITY)
Admission: RE | Admit: 2022-07-15 | Discharge: 2022-07-15 | Disposition: A | Discharge status: Home / Self Care | Payer: Medicare Other | Source: Ambulatory Visit | Attending: Family Medicine | Admitting: Family Medicine

## 2022-07-15 DIAGNOSIS — E119 Type 2 diabetes mellitus without complications: Secondary | ICD-10-CM | POA: Insufficient documentation

## 2022-07-15 DIAGNOSIS — E782 Mixed hyperlipidemia: Secondary | ICD-10-CM | POA: Diagnosis not present

## 2022-07-15 DIAGNOSIS — I272 Pulmonary hypertension, unspecified: Secondary | ICD-10-CM | POA: Insufficient documentation

## 2022-07-15 DIAGNOSIS — I5032 Chronic diastolic (congestive) heart failure: Secondary | ICD-10-CM

## 2022-07-15 DIAGNOSIS — I110 Hypertensive heart disease with heart failure: Secondary | ICD-10-CM | POA: Diagnosis not present

## 2022-07-15 DIAGNOSIS — I4891 Unspecified atrial fibrillation: Secondary | ICD-10-CM | POA: Insufficient documentation

## 2022-07-15 DIAGNOSIS — I3481 Nonrheumatic mitral (valve) annulus calcification: Secondary | ICD-10-CM | POA: Diagnosis not present

## 2022-07-15 DIAGNOSIS — E785 Hyperlipidemia, unspecified: Secondary | ICD-10-CM | POA: Insufficient documentation

## 2022-07-15 LAB — COMPREHENSIVE METABOLIC PANEL
ALT: 21 U/L (ref 0–44)
AST: 17 U/L (ref 15–41)
Albumin: 4.4 g/dL (ref 3.5–5.0)
Alkaline Phosphatase: 112 U/L (ref 38–126)
Anion gap: 10 (ref 5–15)
BUN: 31 mg/dL — ABNORMAL HIGH (ref 8–23)
CO2: 25 mmol/L (ref 22–32)
Calcium: 9.3 mg/dL (ref 8.9–10.3)
Chloride: 104 mmol/L (ref 98–111)
Creatinine, Ser: 1.36 mg/dL — ABNORMAL HIGH (ref 0.61–1.24)
GFR, Estimated: 58 mL/min — ABNORMAL LOW (ref >60)
Glucose, Bld: 121 mg/dL — ABNORMAL HIGH (ref 70–99)
Potassium: 4 mmol/L (ref 3.5–5.1)
Sodium: 139 mmol/L (ref 135–145)
Total Bilirubin: 1.2 mg/dL (ref 0.3–1.2)
Total Protein: 7.8 g/dL (ref 6.5–8.1)

## 2022-07-15 LAB — CBC
HCT: 41.8 % (ref 39.0–52.0)
Hemoglobin: 14.2 g/dL (ref 13.0–17.0)
MCH: 32.9 pg (ref 26.0–34.0)
MCHC: 34 g/dL (ref 30.0–36.0)
MCV: 96.8 fL (ref 80.0–100.0)
Platelets: 230 10*3/uL (ref 150–400)
RBC: 4.32 MIL/uL (ref 4.22–5.81)
RDW: 13.5 % (ref 11.5–15.5)
WBC: 7.9 10*3/uL (ref 4.0–10.5)
nRBC: 0 % (ref 0.0–0.2)

## 2022-07-15 LAB — LIPID PANEL
Cholesterol: 93 mg/dL (ref 0–200)
HDL: 39 mg/dL — ABNORMAL LOW (ref >40)
LDL Cholesterol: 44 mg/dL (ref 0–99)
Total CHOL/HDL Ratio: 2.4 RATIO
Triglycerides: 50 mg/dL (ref <150)
VLDL: 10 mg/dL (ref 0–40)

## 2022-07-15 LAB — BRAIN NATRIURETIC PEPTIDE: B Natriuretic Peptide: 82.1 pg/mL (ref 0.0–100.0)

## 2022-07-15 LAB — ECHOCARDIOGRAM COMPLETE
Area-P 1/2: 3.51 cm2
S' Lateral: 3.3 cm

## 2022-07-15 NOTE — Progress Notes (Signed)
  Echocardiogram 2D Echocardiogram has been performed.  Paul Mclaughlin 07/15/2022, 2:42 PM

## 2022-07-20 ENCOUNTER — Telehealth (HOSPITAL_COMMUNITY): Payer: Self-pay | Admitting: Cardiology

## 2022-07-20 ENCOUNTER — Other Ambulatory Visit: Payer: Self-pay

## 2022-07-20 DIAGNOSIS — I50812 Chronic right heart failure: Secondary | ICD-10-CM

## 2022-07-20 DIAGNOSIS — I272 Pulmonary hypertension, unspecified: Secondary | ICD-10-CM

## 2022-07-20 MED ORDER — TORSEMIDE 20 MG PO TABS
ORAL_TABLET | ORAL | 3 refills | Status: DC
  Filled 2022-07-20 – 2022-08-23 (×2): qty 270, 90d supply, fill #0
  Filled 2022-11-24: qty 270, 90d supply, fill #1
  Filled 2023-02-22: qty 270, 90d supply, fill #2
  Filled 2023-05-25: qty 270, 90d supply, fill #3

## 2022-07-20 NOTE — Telephone Encounter (Signed)
Patient called.  Patient aware.  

## 2022-07-20 NOTE — Telephone Encounter (Signed)
-----   Message from [name], MD sent at 07/17/2022  7:47 PM EST ----- IVC dilated, mild RV dysfunction.  Would increase torsemide to 40 qam/20 qpm with BMET in 10 days.  Make sure he has followup.

## 2022-07-28 ENCOUNTER — Ambulatory Visit (HOSPITAL_COMMUNITY)
Admission: RE | Admit: 2022-07-28 | Discharge: 2022-07-28 | Disposition: A | Discharge status: Home / Self Care | Payer: Medicare Other | Source: Ambulatory Visit | Attending: Internal Medicine | Admitting: Internal Medicine

## 2022-07-28 DIAGNOSIS — I50812 Chronic right heart failure: Secondary | ICD-10-CM | POA: Insufficient documentation

## 2022-07-28 DIAGNOSIS — I272 Pulmonary hypertension, unspecified: Secondary | ICD-10-CM | POA: Diagnosis present

## 2022-07-28 LAB — BASIC METABOLIC PANEL
Anion gap: 13 (ref 5–15)
BUN: 38 mg/dL — ABNORMAL HIGH (ref 8–23)
CO2: 25 mmol/L (ref 22–32)
Calcium: 9.5 mg/dL (ref 8.9–10.3)
Chloride: 101 mmol/L (ref 98–111)
Creatinine, Ser: 1.65 mg/dL — ABNORMAL HIGH (ref 0.61–1.24)
GFR, Estimated: 46 mL/min — ABNORMAL LOW (ref >60)
Glucose, Bld: 120 mg/dL — ABNORMAL HIGH (ref 70–99)
Potassium: 4.1 mmol/L (ref 3.5–5.1)
Sodium: 139 mmol/L (ref 135–145)

## 2022-08-23 ENCOUNTER — Other Ambulatory Visit: Payer: Self-pay

## 2022-08-23 ENCOUNTER — Encounter: Payer: Self-pay | Admitting: Family Medicine

## 2022-08-23 ENCOUNTER — Ambulatory Visit: Payer: Medicare Other | Attending: Family Medicine | Admitting: Family Medicine

## 2022-08-23 VITALS — BP 134/66 | HR 57 | Temp 98.0°F | Ht 73.0 in | Wt 247.0 lb

## 2022-08-23 DIAGNOSIS — I480 Paroxysmal atrial fibrillation: Secondary | ICD-10-CM

## 2022-08-23 DIAGNOSIS — E1159 Type 2 diabetes mellitus with other circulatory complications: Secondary | ICD-10-CM

## 2022-08-23 DIAGNOSIS — E1151 Type 2 diabetes mellitus with diabetic peripheral angiopathy without gangrene: Secondary | ICD-10-CM | POA: Insufficient documentation

## 2022-08-23 DIAGNOSIS — E66813 Obesity, class 3: Secondary | ICD-10-CM

## 2022-08-23 DIAGNOSIS — E118 Type 2 diabetes mellitus with unspecified complications: Secondary | ICD-10-CM

## 2022-08-23 DIAGNOSIS — I5042 Chronic combined systolic (congestive) and diastolic (congestive) heart failure: Secondary | ICD-10-CM

## 2022-08-23 DIAGNOSIS — R188 Other ascites: Secondary | ICD-10-CM

## 2022-08-23 DIAGNOSIS — E1142 Type 2 diabetes mellitus with diabetic polyneuropathy: Secondary | ICD-10-CM | POA: Diagnosis not present

## 2022-08-23 DIAGNOSIS — S98132D Complete traumatic amputation of one left lesser toe, subsequent encounter: Secondary | ICD-10-CM | POA: Diagnosis not present

## 2022-08-23 DIAGNOSIS — K7460 Unspecified cirrhosis of liver: Secondary | ICD-10-CM

## 2022-08-23 DIAGNOSIS — I272 Pulmonary hypertension, unspecified: Secondary | ICD-10-CM | POA: Diagnosis not present

## 2022-08-23 DIAGNOSIS — M62838 Other muscle spasm: Secondary | ICD-10-CM | POA: Diagnosis not present

## 2022-08-23 DIAGNOSIS — I739 Peripheral vascular disease, unspecified: Secondary | ICD-10-CM | POA: Diagnosis not present

## 2022-08-23 DIAGNOSIS — I152 Hypertension secondary to endocrine disorders: Secondary | ICD-10-CM

## 2022-08-23 DIAGNOSIS — Z6841 Body Mass Index (BMI) 40.0 and over, adult: Secondary | ICD-10-CM

## 2022-08-23 DIAGNOSIS — I110 Hypertensive heart disease with heart failure: Secondary | ICD-10-CM | POA: Diagnosis not present

## 2022-08-23 DIAGNOSIS — E1140 Type 2 diabetes mellitus with diabetic neuropathy, unspecified: Secondary | ICD-10-CM | POA: Insufficient documentation

## 2022-08-23 LAB — POCT GLYCOSYLATED HEMOGLOBIN (HGB A1C): HbA1c, POC (controlled diabetic range): 7 % (ref 0.0–7.0)

## 2022-08-23 LAB — POCT ABI - SCREENING FOR PILOT NO CHARGE
Left ABI: 1.32
Right ABI: 1.12

## 2022-08-23 LAB — GLUCOSE, POCT (MANUAL RESULT ENTRY): POC Glucose: 151 mg/dl — AB (ref 70–99)

## 2022-08-23 MED ORDER — GABAPENTIN 300 MG PO CAPS
300.0000 mg | ORAL_CAPSULE | Freq: Two times a day (BID) | ORAL | 1 refills | Status: DC
  Filled 2022-11-24: qty 180, 90d supply, fill #0

## 2022-08-23 MED ORDER — GLIPIZIDE 5 MG PO TABS
2.5000 mg | ORAL_TABLET | Freq: Two times a day (BID) | ORAL | 1 refills | Status: DC
  Filled 2022-11-24: qty 90, 90d supply, fill #0

## 2022-08-23 MED ORDER — AMLODIPINE BESYLATE 10 MG PO TABS
10.0000 mg | ORAL_TABLET | Freq: Every day | ORAL | 1 refills | Status: DC
  Filled 2022-11-24: qty 90, 90d supply, fill #0
  Filled 2023-03-02: qty 90, 90d supply, fill #1

## 2022-08-23 MED ORDER — ATORVASTATIN CALCIUM 20 MG PO TABS
ORAL_TABLET | Freq: Every day | ORAL | 1 refills | Status: DC
  Filled 2022-11-24: qty 90, 90d supply, fill #0

## 2022-08-23 MED ORDER — DAPAGLIFLOZIN PROPANEDIOL 10 MG PO TABS
10.0000 mg | ORAL_TABLET | Freq: Every day | ORAL | 1 refills | Status: DC
  Filled 2022-11-24: qty 90, 90d supply, fill #0

## 2022-08-23 MED ORDER — RIVAROXABAN 20 MG PO TABS
20.0000 mg | ORAL_TABLET | Freq: Every day | ORAL | 1 refills | Status: DC
  Filled 2022-11-24: qty 90, 90d supply, fill #0

## 2022-08-23 NOTE — Patient Instructions (Signed)

## 2022-08-23 NOTE — Progress Notes (Signed)
Subjective:  Patient ID: Paul Mclaughlin, male    DOB: [birth year]  Age: 66 y.o. MRN: [id number]  CC: Diabetes   HPI ARDIAN HABERLAND is a 66 y.o. year old male with a history of  type 2 diabetes mellitus (A1c 7.0), hypertension, atrial fibrillation (on anticoagulation with Xarelto and rate control with metoprolol), history of Congenital heart disease status post surgery, Pulmonary hypertension, congestive heart failure ( EF of 65-70% from 2-D echo 07/2022, mild LVH,  RV function mildly reduced, mildly elevated PA systolic pressure, moderately dilated LA, IVC dilatation), peripheral arterial disease , liver cirrhosis, COVID-19 pneumonitis   Interval History:  He had a visit to CHF clinic 2 months ago and spironolactone dose was increased at that visit due to elevated BP.  His torsemide dose was increased after his echo results were obtained. He denies presence of chest pain, pedal edema and weight has been stable.  He Complains of his left hamstrings have been painful for the last couple of weeks and is worse when he walks but states it is mild.  Denies presence of pain in his cough.  He states he has had problems going up stairs as his right leg has always been weak. He is unsure if he has a cramp in his left hamstring. He has no claudication pain.  A1c is 7.0 down from 9.1 previously.  He denies episodes of hypoglycemia and is doing well on glipizide and [place].  Metformin had been discontinued at his last visit due to GI adverse effects.  His neuropathy is controlled on gabapentin. Past Medical History:  Diagnosis Date   Atrial fibrillation (HCC)    Cellulitis and abscess of left leg 06/2016   CHF (congestive heart failure) (HCC)    Diabetes (HCC)    Dyspnea     Past Surgical History:  Procedure Laterality Date   AMPUTATION Left 06/24/2016   Procedure: FOOT FIFTH RAY TOE AMPUTATION;  Surgeon: [name], MD;  Location: [facility] OR;  Service: Orthopedics;  Laterality: Left;   IR  PARACENTESIS  11/12/2020   IR PARACENTESIS  11/13/2020   IR PARACENTESIS  11/16/2020   open heart surgery     As a child.  [age] years old.  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    RIGHT HEART CATH N/A 02/17/2021   Procedure: RIGHT HEART CATH;  Surgeon: [name], MD;  Location: [facility] INVASIVE CV LAB;  Service: Cardiovascular;  Laterality: N/A;   RIGHT/LEFT HEART CATH AND CORONARY ANGIOGRAPHY N/A 03/30/2017   Procedure: Right/Left Heart Cath and Coronary Angiography;  Surgeon: [name], MD;  Location: [facility] INVASIVE CV LAB;  Service: Cardiovascular;  Laterality: N/A;    Family History  Problem Relation Age of Onset   Diabetes Mellitus II Mother        ESRD   Diabetes Mellitus II Brother    Emphysema Father     Social History   Socioeconomic History   Marital status: Divorced    Spouse name: Not on file   Number of children: 1   Years of education: Not on file   Highest education level: Not on file  Occupational History   Not on file  Tobacco Use   Smoking status: Never   Smokeless tobacco: Never  Vaping Use   Vaping Use: Never used  Substance and Sexual Activity   Alcohol use: No    Comment: gave up drinking ~20 years ago   Drug use: Yes    Types: Marijuana    Comment: daily  Sexual activity: Not on file  Other Topics Concern   Not on file  Social History Narrative   Lives alone    Social Determinants of Health   Financial Resource Strain: Not on file  Food Insecurity: Not on file  Transportation Needs: Not on file  Physical Activity: Not on file  Stress: Not on file  Social Connections: Not on file    Allergies  Allergen Reactions   Tadalafil Other (See Comments)    Patient experiences a head ache with this medication.    Outpatient Medications Prior to Visit  Medication Sig Dispense Refill   albuterol (PROVENTIL HFA;VENTOLIN HFA) 108 (90 Base) MCG/ACT inhaler Inhale 2 puffs into the lungs every 6 (six) hours as needed for wheezing or shortness of breath. 1 Inhaler 0    Misc. Devices MISC Blood pressure monitor.  Diagnosis- hypertension 1 each 0   predniSONE (DELTASONE) 20 MG tablet Take 1 tablet (20 mg total) by mouth daily with breakfast. 5 tablet 0   spironolactone (ALDACTONE) 25 MG tablet Take 2 tablets (50 mg total) by mouth daily. 180 tablet 3   torsemide (DEMADEX) 20 MG tablet Take 2 tablets (40 mg total) by mouth every morning AND 1 tablet (20 mg total) every evening. 270 tablet 3   amLODipine (NORVASC) 10 MG tablet Take 1 tablet (10 mg total) by mouth daily. 90 tablet 1   atorvastatin (LIPITOR) 20 MG tablet TAKE 1 TABLET (20 MG TOTAL) BY MOUTH DAILY. 90 tablet 1   dapagliflozin propanediol (FARXIGA) 10 MG TABS tablet Take 1 tablet (10 mg total) by mouth daily before breakfast. 90 tablet 1   gabapentin (NEURONTIN) 300 MG capsule Take 1 capsule (300 mg total) by mouth 2 (two) times daily. 180 capsule 1   glipiZIDE (GLUCOTROL) 5 MG tablet Take 0.5 tablets (2.5 mg total) by mouth 2 (two) times daily before a meal. 90 tablet 1   rivaroxaban (XARELTO) 20 MG TABS tablet Take 1 tablet (20 mg total) by mouth once daily with supper. (Needs PCP office visit for more refills). 90 tablet 1   No facility-administered medications prior to visit.     ROS Review of Systems  Constitutional:  Negative for activity change and appetite change.  HENT:  Negative for sinus pressure and sore throat.   Respiratory:  Negative for chest tightness, shortness of breath and wheezing.   Cardiovascular:  Negative for chest pain and palpitations.  Gastrointestinal:  Negative for abdominal distention, abdominal pain and constipation.  Genitourinary: Negative.   Musculoskeletal: Negative.   Psychiatric/Behavioral:  Negative for behavioral problems and dysphoric mood.     Objective:  BP 134/66   Pulse (!) 57   Temp 98 F (36.7 C) (Oral)   Ht 6\' 1"  (1.854 m)   Wt 247 lb (112 kg)   SpO2 95%   BMI 32.59 kg/m      08/23/2022    3:00 PM 08/23/2022    2:26 PM 06/30/2022     2:34 PM  BP/Weight  Systolic BP 134 141 150  Diastolic BP 66 73 90  Wt. (Lbs)  247 248  BMI  32.59 kg/m2 32.72 kg/m2    Wt Readings from Last 3 Encounters:  08/23/22 247 lb (112 kg)  06/30/22 248 lb (112.5 kg)  05/17/22 229 lb (103.9 kg)     Physical Exam Constitutional:      Appearance: He is well-developed.  Cardiovascular:     Rate and Rhythm: Bradycardia present.     Heart sounds: Normal  heart sounds. No murmur heard. Pulmonary:     Effort: Pulmonary effort is normal.     Breath sounds: Normal breath sounds. No wheezing or rales.  Chest:     Chest wall: No tenderness.  Abdominal:     General: Bowel sounds are normal. There is no distension.     Palpations: Abdomen is soft. There is no mass.     Tenderness: There is no abdominal tenderness.  Musculoskeletal:        General: Normal range of motion.     Right lower leg: No edema.     Left lower leg: No edema.  Neurological:     Mental Status: He is alert and oriented to person, place, and time.     Comments: Strength: 5/5 in bilateral lower extremity  Psychiatric:        Mood and Affect: Mood normal.    Diabetic Foot Exam - Simple   Simple Foot Form Diabetic Foot exam was performed with the following findings: Yes 08/23/2022  2:43 PM  Visual Inspection See comments: Yes Sensation Testing See comments: Yes Pulse Check See comments: Yes Comments Left foot: Amputation of left fifth toe. Normal dorsalis pedis and posterior tibialis Normal monofilament testing In office ABI-1.32 Right foot: Callus on medial aspect of right great toe.  Dry ulcer on plantar aspect of right toe with no discharge Reduced monofilament testing in tips of toes of right foot Absent dorsalis pedis and posterior tibialis In office ABI-1.12        Latest Ref Rng & Units 07/28/2022    1:43 PM 07/15/2022    1:58 PM 06/20/2022    2:45 PM  CMP  Glucose 70 - 99 mg/dL 409  811  914   BUN 8 - 23 mg/dL 38  31  28   Creatinine 0.61 -  1.24 mg/dL 7.82  9.56  2.13   Sodium 135 - 145 mmol/L 139  139  138   Potassium 3.5 - 5.1 mmol/L 4.1  4.0  4.4   Chloride 98 - 111 mmol/L 101  104  100   CO2 22 - 32 mmol/L Calcium 8.9 - 10.3 mg/dL 9.5  9.3  9.6   Total Protein 6.5 - 8.1 g/dL  7.8  7.5   Total Bilirubin 0.3 - 1.2 mg/dL  1.2  0.8   Alkaline Phos 38 - 126 U/L  112  126   AST 15 - 41 U/L  17  15   ALT 0 - 44 U/L  21  14     Lipid Panel     Component Value Date/Time   CHOL 93 07/15/2022 1358   CHOL 103 06/29/2021 1620   TRIG 50 07/15/2022 1358   HDL 39 (L) 07/15/2022 1358   HDL 46 06/29/2021 1620   CHOLHDL 2.4 07/15/2022 1358   VLDL 10 07/15/2022 1358   LDLCALC 44 07/15/2022 1358   LDLCALC 42 06/29/2021 1620    CBC    Component Value Date/Time   WBC 7.9 07/15/2022 1358   RBC 4.32 07/15/2022 1358   HGB 14.2 07/15/2022 1358   HGB 14.1 09/29/2021 1453   HCT 41.8 07/15/2022 1358   HCT 41.5 09/29/2021 1453   PLT 230 07/15/2022 1358   PLT 274 09/29/2021 1453   MCV 96.8 07/15/2022 1358   MCV 95 09/29/2021 1453   MCH 32.9 07/15/2022 1358   MCHC 34.0 07/15/2022 1358   RDW 13.5 07/15/2022 1358   RDW 12.3  09/29/2021 1453   LYMPHSABS 1.2 09/29/2021 1453   MONOABS 0.4 11/18/2020 0120   EOSABS 0.4 09/29/2021 1453   BASOSABS 0.1 09/29/2021 1453    Lab Results  Component Value Date   HGBA1C 7.0 08/23/2022    Assessment & Plan:  1. Type 2 diabetes mellitus with complication, without long-term current use of insulin (HCC) A1C of 7.0 which has improved from 9.1 Continue glipizide, Farxiga Counseled on Diabetic diet, my plate method, 161150 minutes of moderate intensity exercise/week Blood sugar logs with fasting goals of 80-120 mg/dl, random of less than 096180 and in the event of sugars less than 60 mg/dl or greater than 045400 mg/dl encouraged to notify the clinic. Advised on the need for annual eye exams, annual foot exams, Pneumonia vaccine. - POCT glucose (manual entry) - POCT glycosylated hemoglobin  (Hb A1C) - atorvastatin (LIPITOR) 20 MG tablet; TAKE 1 TABLET (20 MG TOTAL) BY MOUTH DAILY.  Dispense: 90 tablet; Refill: 1 - dapagliflozin propanediol (FARXIGA) 10 MG TABS tablet; Take 1 tablet (10 mg total) by mouth daily before breakfast.  Dispense: 90 tablet; Refill: 1 - glipiZIDE (GLUCOTROL) 5 MG tablet; Take 0.5 tablets (2.5 mg total) by mouth 2 (two) times daily before a meal.  Dispense: 90 tablet; Refill: 1  2. Hypertension associated with diabetes (HCC) Controlled Counseled on blood pressure goal of less than 130/80, low-sodium, DASH diet, medication compliance, 150 minutes of moderate intensity exercise per week. Discussed medication compliance, adverse effects. - amLODipine (NORVASC) 10 MG tablet; Take 1 tablet (10 mg total) by mouth daily.  Dispense: 90 tablet; Refill: 1  3. Diabetic polyneuropathy associated with type 2 diabetes mellitus (HCC) Stable - gabapentin (NEURONTIN) 300 MG capsule; Take 1 capsule (300 mg total) by mouth 2 (two) times daily.  Dispense: 180 capsule; Refill: 1  4. Paroxysmal atrial fibrillation (HCC) Currently in sinus rhythm Continue rate control with beta-blocker, anticoagulation with Xarelto Follow-up with A-fib clinic - rivaroxaban (XARELTO) 20 MG TABS tablet; Take 1 tablet (20 mg total) by mouth once daily with supper. (Needs PCP office visit for more refills).  Dispense: 90 tablet; Refill: 1  5. Muscle spasm Symptoms are absent at the moment He declines initiation of muscle relaxant and states he will reach out if his symptoms worsen - POCT ABI Screening for Pilot No Charge  6.  Hypertensive heart disease EF of 65 to 70% from echo 07/2022 Euvolemic Continue SGLT2, beta-blocker, spironolactone Follow-up with CHF clinic  7.  Pulmonary hypertension Currently not on pulmonary vasodilators Followed by cardiology Last right heart cath was in 02/2021 which revealed normal filling pressure, moderate pulmonary hypertension  8.   Cirrhosis Asymptomatic Currently not consuming alcohol  9.  PAD He has no claudication pain ABI is normal in both feet Risk factor modification including adherence with statin    Meds ordered this encounter  Medications   amLODipine (NORVASC) 10 MG tablet    Sig: Take 1 tablet (10 mg total) by mouth daily.    Dispense:  90 tablet    Refill:  1   atorvastatin (LIPITOR) 20 MG tablet    Sig: TAKE 1 TABLET (20 MG TOTAL) BY MOUTH DAILY.    Dispense:  90 tablet    Refill:  1   dapagliflozin propanediol (FARXIGA) 10 MG TABS tablet    Sig: Take 1 tablet (10 mg total) by mouth daily before breakfast.    Dispense:  90 tablet    Refill:  1   gabapentin (NEURONTIN) 300 MG capsule  Sig: Take 1 capsule (300 mg total) by mouth 2 (two) times daily.    Dispense:  180 capsule    Refill:  1   glipiZIDE (GLUCOTROL) 5 MG tablet    Sig: Take 0.5 tablets (2.5 mg total) by mouth 2 (two) times daily before a meal.    Dispense:  90 tablet    Refill:  1   rivaroxaban (XARELTO) 20 MG TABS tablet    Sig: Take 1 tablet (20 mg total) by mouth once daily with supper. (Needs PCP office visit for more refills).    Dispense:  90 tablet    Refill:  1    Follow-up: Return in 6 months (on 02/22/2023) for covering provider 2 schedule for AWV on a friday.[name], MD, FAAFP. [facility] [place], [place] [phone]   08/23/2022, 3:22 PM

## 2022-08-24 ENCOUNTER — Other Ambulatory Visit: Payer: Self-pay

## 2022-09-23 ENCOUNTER — Other Ambulatory Visit: Payer: Self-pay

## 2022-09-28 ENCOUNTER — Other Ambulatory Visit: Payer: Self-pay

## 2022-09-30 ENCOUNTER — Other Ambulatory Visit: Payer: Self-pay

## 2022-10-12 ENCOUNTER — Encounter: Payer: Self-pay | Admitting: Family Medicine

## 2022-10-12 ENCOUNTER — Ambulatory Visit: Payer: Medicare Other | Attending: Family Medicine | Admitting: Family Medicine

## 2022-10-12 VITALS — BP 131/72 | HR 69 | Temp 98.0°F | Ht 73.0 in | Wt 243.8 lb

## 2022-10-12 DIAGNOSIS — Z0000 Encounter for general adult medical examination without abnormal findings: Secondary | ICD-10-CM | POA: Diagnosis not present

## 2022-10-12 DIAGNOSIS — R195 Other fecal abnormalities: Secondary | ICD-10-CM

## 2022-10-12 DIAGNOSIS — Z1211 Encounter for screening for malignant neoplasm of colon: Secondary | ICD-10-CM

## 2022-10-12 NOTE — Progress Notes (Unsigned)
Subjective:   Paul Mclaughlin is a 67 y.o. male who presents for a Welcome to Medicare exam.   He does not exercise much. Goes to [place]  but that's it. He has a good intake of fruits and veggies. He has not been to see his cardiologist in a while and has not had a recent eye exam.  He did have colorectal cancer screening which revealed a positive Cologuard test and was referred to GI for colonoscopy but never followed through. Last month he had a chronic disease management visit with me to address his chronic medical conditions.  Review of Systems: General: negative for fever, weight loss, appetite change Eyes: no visual symptoms. ENT: no ear symptoms, no sinus tenderness, no nasal congestion or sore throat. Neck: no pain  Respiratory: no wheezing, shortness of breath, cough Cardiovascular: no chest pain, no dyspnea on exertion, no pedal edema, no orthopnea. Gastrointestinal: no abdominal pain, no diarrhea, no constipation Genito-Urinary: no urinary frequency, no dysuria, no polyuria. Hematologic: no bruising Endocrine: no cold or heat intolerance Neurological: no headaches, no seizures, no tremors Musculoskeletal: no joint pains, no joint swelling Skin: no pruritus, no rash. Psychological: no depression, no anxiety,        Objective:    Today's Vitals   10/12/22 1539  BP: 131/72  Pulse: 69  Temp: 98 F (36.7 C)  TempSrc: Oral  SpO2: 97%  Weight: 243 lb 12.8 oz (110.6 kg)  Height: 6\' 1"  (1.854 m)   Body mass index is 32.17 kg/m.  Medications Outpatient Encounter Medications as of 10/12/2022  Medication Sig   albuterol (PROVENTIL HFA;VENTOLIN HFA) 108 (90 Base) MCG/ACT inhaler Inhale 2 puffs into the lungs every 6 (six) hours as needed for wheezing or shortness of breath.   amLODipine (NORVASC) 10 MG tablet Take 1 tablet (10 mg total) by mouth daily.   atorvastatin (LIPITOR) 20 MG tablet TAKE 1 TABLET (20 MG TOTAL) BY MOUTH DAILY.   dapagliflozin propanediol  (FARXIGA) 10 MG TABS tablet Take 1 tablet (10 mg total) by mouth daily before breakfast.   gabapentin (NEURONTIN) 300 MG capsule Take 1 capsule (300 mg total) by mouth 2 (two) times daily.   glipiZIDE (GLUCOTROL) 5 MG tablet Take 0.5 tablets (2.5 mg total) by mouth 2 (two) times daily before a meal.   Misc. Devices MISC Blood pressure monitor.  Diagnosis- hypertension   predniSONE (DELTASONE) 20 MG tablet Take 1 tablet (20 mg total) by mouth daily with breakfast.   rivaroxaban (XARELTO) 20 MG TABS tablet Take 1 tablet (20 mg total) by mouth once daily with supper. (Needs PCP office visit for more refills).   spironolactone (ALDACTONE) 25 MG tablet Take 2 tablets (50 mg total) by mouth daily.   torsemide (DEMADEX) 20 MG tablet Take 2 tablets (40 mg total) by mouth every morning AND 1 tablet (20 mg total) every evening.   [DISCONTINUED] hydrALAZINE (APRESOLINE) 100 MG tablet TAKE 1 TABLET (100 MG TOTAL) BY MOUTH 3 (THREE) TIMES DAILY.   [DISCONTINUED] lisinopril (ZESTRIL) 2.5 MG tablet Take 1 tablet (2.5 mg total) by mouth daily.   [DISCONTINUED] metoprolol tartrate (LOPRESSOR) 50 MG tablet Take 1 tablet (50 mg total) by mouth 2 (two) times daily.   No facility-administered encounter medications on file as of 10/12/2022.     History: Past Medical History:  Diagnosis Date   Atrial fibrillation ([place])    Cellulitis and abscess of left leg 06/2016   CHF (congestive heart failure) ([place])    Diabetes ([place])  Dyspnea    Past Surgical History:  Procedure Laterality Date   AMPUTATION Left 06/24/2016   Procedure: FOOT FIFTH RAY TOE AMPUTATION;  Surgeon: Newt Minion, MD;  Location: [place];  Service: Orthopedics;  Laterality: Left;   IR PARACENTESIS  11/12/2020   IR PARACENTESIS  11/13/2020   IR PARACENTESIS  11/16/2020   open heart surgery     As a child.  66 years old.  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    RIGHT HEART CATH N/A 02/17/2021   Procedure: RIGHT HEART CATH;  Surgeon: Larey Dresser, MD;  Location: [place] CV LAB;  Service: Cardiovascular;  Laterality: N/A;   RIGHT/LEFT HEART CATH AND CORONARY ANGIOGRAPHY N/A 03/30/2017   Procedure: Right/Left Heart Cath and Coronary Angiography;  Surgeon: Larey Dresser, MD;  Location: [place] CV LAB;  Service: Cardiovascular;  Laterality: N/A;    Family History  Problem Relation Age of Onset   Diabetes Mellitus II Mother        ESRD   Diabetes Mellitus II Brother    Emphysema Father    Social History   Occupational History   Not on file  Tobacco Use   Smoking status: Never   Smokeless tobacco: Never  Vaping Use   Vaping Use: Never used  Substance and Sexual Activity   Alcohol use: No    Comment: gave up drinking ~20 years ago   Drug use: Yes    Types: Marijuana    Comment: daily   Sexual activity: Not on file   Tobacco Counseling Counseling given: Not Answered   Immunizations and Health Maintenance Immunization History  Administered Date(s) Administered   PFIZER(Purple Top)SARS-COV-2 Vaccination 01/02/2020, 01/27/2020   Pneumococcal Polysaccharide-23 02/08/2017   Health Maintenance Due  Topic Date Due   COVID-19 Vaccine (3 - 2023-24 season) 05/13/2022   Diabetic kidney evaluation - Urine ACR  06/29/2022   OPHTHALMOLOGY EXAM  09/30/2022    Activities of Daily Living    10/12/2022    3:32 PM  In your present state of health, do you have any difficulty performing the following activities:  Hearing? 0  Vision? 0  Difficulty concentrating or making decisions? 0  Walking or climbing stairs? 0  Dressing or bathing? 0  Doing errands, shopping? 0  Preparing Food and eating ? N  Using the Toilet? N  In the past six months, have you accidently leaked urine? N  Do you have problems with loss of bowel control? N  Managing your Medications? N  Managing your Finances? N  Housekeeping or managing your Housekeeping? N    Physical Exam   Constitutional: normal appearing,  Eyes: PERRLA HEENT: Head is atraumatic, normal  sinuses, normal oropharynx, normal appearing tonsils and palate, tympanic membrane is normal bilaterally. Neck: normal range of motion, no thyromegaly, no JVD Cardiovascular: normal rate and rhythm, normal heart sounds, no murmurs, rub or gallop, no pedal edema Respiratory: Normal breath sounds, clear to auscultation bilaterally, no wheezes, no rales, no rhonchi Abdomen: soft, not tender to palpation, normal bowel sounds, no enlarged organs Musculoskeletal: Full ROM, no tenderness in joints Skin: warm and dry, no lesions. Neurological: alert, oriented x3, cranial nerves I-XII grossly intact , normal motor strength, normal sensation. Psychological: normal mood.  Advanced Directives: Does Patient Have a Medical Advance Directive?: No we have provided a copy of advance directives for him.    Assessment:      Vision/Hearing screen Vision Screening   Right eye Left eye Both eyes  Without correction  With correction 20/20 20/30     Dietary issues and exercise activities discussed:      Goals      Exercise 150 min/wk Moderate Activity        Depression Screen    10/12/2022    3:32 PM 05/17/2022    3:00 PM 11/24/2020   10:57 AM 12/31/2019    2:17 PM  PHQ 2/9 Scores  PHQ - 2 Score 0 0  0  PHQ- 9 Score  0  6  Exception Documentation   Patient refusal      Fall Risk    10/12/2022    3:32 PM  [place] in the past year? 0  Number falls in past yr: 0  Injury with Fall? 0    Cognitive Function    10/12/2022    3:33 PM  MMSE - Mini Mental State Exam  Orientation to time 5  Orientation to Place 5  Registration 3  Attention/ Calculation 5  Recall 3  Language- name 2 objects 2  Language- repeat 1  Language- follow 3 step command 3  Language- read & follow direction 1  Write a sentence 1  Copy design 1  Total score 30        10/12/2022    3:33 PM  6CIT Screen  What Year? 0 points  What month? 0 points  What time? 0 points  Count back from 20 0 points   Months in reverse 0 points  Repeat phrase 0 points  Total Score 0 points    Patient Care Team: Charlott Rakes, MD as PCP - General (Family Medicine)     Plan:   1. Encounter for Medicare annual wellness exam Counseled on 150 minutes of exercise per week, healthy eating (including decreased daily intake of saturated fats, cholesterol, added sugars, sodium) routine healthcare maintenance.    2. Positive colorectal cancer screening using DNA-based stool test - Ambulatory referral to Gastroenterology   3. Screening for colon cancer - Ambulatory referral to Ophthalmology    I have personally reviewed and noted the following in the patient's chart:   Medical and social history Use of alcohol, tobacco or illicit drugs  Current medications and supplements Functional ability and status Nutritional status Physical activity Advanced directives List of other physicians Hospitalizations, surgeries, and ER visits in previous 12 months Vitals Screenings to include cognitive, depression, and falls Referrals and appointments  In addition, I have reviewed and discussed with patient certain preventive protocols, quality metrics, and best practice recommendations. A written personalized care plan for preventive services as well as general preventive health recommendations were provided to patient.   He declines PCV 20.  Charlott Rakes, MD 10/13/2022

## 2022-10-12 NOTE — Patient Instructions (Signed)
  [name] , Thank you for taking time to come for your Medicare Wellness Visit. I appreciate your ongoing commitment to your health goals. Please review the following plan we discussed and let me know if I can assist you in the future.   These are the goals we discussed:  Goals      Exercise 150 min/wk Moderate Activity        This is a list of the screening recommended for you and due dates:  Health Maintenance  Topic Date Due   COVID-19 Vaccine (3 - 2023-24 season) 05/13/2022   Yearly kidney health urinalysis for diabetes  06/29/2022   Eye exam for diabetics  09/30/2022   Zoster (Shingles) Vaccine (1 of 2) 11/22/2022*   Flu Shot  12/11/2022*   Pneumonia Vaccine (2 - PCV) 08/24/2023*   Hemoglobin A1C  02/22/2023   Yearly kidney function blood test for diabetes  07/29/2023   Complete foot exam   08/24/2023   Medicare Annual Wellness Visit  10/13/2023   Cologuard (Stool DNA test)  07/19/2024   Hepatitis C Screening: USPSTF Recommendation to screen - Ages 18-79 yo.  Completed   HIV Screening  Completed   HPV Vaccine  Aged Out   DTaP/Tdap/Td vaccine  Discontinued  *Topic was postponed. The date shown is not the original due date.

## 2022-10-13 ENCOUNTER — Encounter: Payer: Self-pay | Admitting: Family Medicine

## 2022-11-24 ENCOUNTER — Other Ambulatory Visit: Payer: Self-pay

## 2022-11-25 ENCOUNTER — Other Ambulatory Visit: Payer: Self-pay

## 2023-02-22 ENCOUNTER — Encounter: Payer: Self-pay | Admitting: Family Medicine

## 2023-02-22 ENCOUNTER — Ambulatory Visit: Payer: Medicare Other | Attending: Family Medicine | Admitting: Family Medicine

## 2023-02-22 ENCOUNTER — Other Ambulatory Visit: Payer: Self-pay

## 2023-02-22 VITALS — BP 125/64 | HR 90 | Temp 97.8°F | Ht 73.0 in | Wt 251.2 lb

## 2023-02-22 DIAGNOSIS — I272 Pulmonary hypertension, unspecified: Secondary | ICD-10-CM | POA: Diagnosis not present

## 2023-02-22 DIAGNOSIS — Z7984 Long term (current) use of oral hypoglycemic drugs: Secondary | ICD-10-CM | POA: Diagnosis not present

## 2023-02-22 DIAGNOSIS — I110 Hypertensive heart disease with heart failure: Secondary | ICD-10-CM | POA: Insufficient documentation

## 2023-02-22 DIAGNOSIS — E1142 Type 2 diabetes mellitus with diabetic polyneuropathy: Secondary | ICD-10-CM | POA: Insufficient documentation

## 2023-02-22 DIAGNOSIS — Z7901 Long term (current) use of anticoagulants: Secondary | ICD-10-CM | POA: Diagnosis not present

## 2023-02-22 DIAGNOSIS — E118 Type 2 diabetes mellitus with unspecified complications: Secondary | ICD-10-CM

## 2023-02-22 DIAGNOSIS — I480 Paroxysmal atrial fibrillation: Secondary | ICD-10-CM | POA: Insufficient documentation

## 2023-02-22 DIAGNOSIS — I4891 Unspecified atrial fibrillation: Secondary | ICD-10-CM | POA: Insufficient documentation

## 2023-02-22 DIAGNOSIS — E1151 Type 2 diabetes mellitus with diabetic peripheral angiopathy without gangrene: Secondary | ICD-10-CM | POA: Diagnosis not present

## 2023-02-22 DIAGNOSIS — I509 Heart failure, unspecified: Secondary | ICD-10-CM | POA: Insufficient documentation

## 2023-02-22 DIAGNOSIS — K7460 Unspecified cirrhosis of liver: Secondary | ICD-10-CM | POA: Diagnosis not present

## 2023-02-22 DIAGNOSIS — M79605 Pain in left leg: Secondary | ICD-10-CM | POA: Diagnosis not present

## 2023-02-22 DIAGNOSIS — Z79899 Other long term (current) drug therapy: Secondary | ICD-10-CM | POA: Diagnosis not present

## 2023-02-22 LAB — POCT GLYCOSYLATED HEMOGLOBIN (HGB A1C): HbA1c, POC (controlled diabetic range): 6.6 % (ref 0.0–7.0)

## 2023-02-22 MED ORDER — DAPAGLIFLOZIN PROPANEDIOL 10 MG PO TABS
10.0000 mg | ORAL_TABLET | Freq: Every day | ORAL | 1 refills | Status: DC
  Filled 2023-02-22: qty 90, 90d supply, fill #0
  Filled 2023-05-25: qty 90, 90d supply, fill #1

## 2023-02-22 MED ORDER — ATORVASTATIN CALCIUM 20 MG PO TABS
20.0000 mg | ORAL_TABLET | Freq: Every day | ORAL | 1 refills | Status: DC
  Filled 2023-02-22: qty 90, 90d supply, fill #0
  Filled 2023-05-25: qty 90, 90d supply, fill #1

## 2023-02-22 MED ORDER — RIVAROXABAN 20 MG PO TABS
20.0000 mg | ORAL_TABLET | Freq: Every day | ORAL | 1 refills | Status: DC
  Filled 2023-02-22: qty 90, 90d supply, fill #0
  Filled 2023-05-25: qty 90, 90d supply, fill #1

## 2023-02-22 MED ORDER — GABAPENTIN 300 MG PO CAPS
300.0000 mg | ORAL_CAPSULE | Freq: Two times a day (BID) | ORAL | 1 refills | Status: DC
  Filled 2023-02-22: qty 180, 90d supply, fill #0
  Filled 2023-06-01: qty 180, 90d supply, fill #1

## 2023-02-22 MED ORDER — GLIPIZIDE 5 MG PO TABS
2.5000 mg | ORAL_TABLET | Freq: Two times a day (BID) | ORAL | 1 refills | Status: DC
  Filled 2023-02-22: qty 90, 90d supply, fill #0
  Filled 2023-05-25: qty 90, 90d supply, fill #1

## 2023-02-22 NOTE — Patient Instructions (Addendum)
[address], [place], [place] [address] Hours:  Open ? Closes 5?PM Phone: [phone]

## 2023-02-22 NOTE — Progress Notes (Signed)
Subjective:  Patient ID: Paul Mclaughlin, male    DOB: Feb 03, 1956  Age: 67 y.o. MRN: [id number]  CC: Diabetes   HPI Paul Mclaughlin is a 67 y.o. year old male with a history of type 2 diabetes mellitus (A1c 6.6), hypertension, atrial fibrillation (on anticoagulation with Xarelto and rate control with metoprolol), history of Congenital heart disease status post surgery, Pulmonary hypertension, congestive heart failure ( EF of 65-70% from 2-D echo 07/2022, mild LVH,  RV function mildly reduced, mildly elevated PA systolic pressure, moderately dilated LA, IVC dilatation), peripheral arterial disease , liver cirrhosis.  Interval History:  His calves and Hamstrings have been hurting worse with going up and down the stairs. Sometimes he has to pick up his legs to get them into the truck. Symptoms progress as the day goes on. He had Complains of this at his visit 6 months. Symptoms occurs in his left leg but not in his right. Prolonged walking worsens his symptoms. Left ABI was 1.32; right ABI was 1.12.  His A1c is 6.6 down from 7.0 previously.  He cut back on chocolate and ice cream and is doing well on his medications.  He has no visual concerns or hypoglycemia.  Neuropathy is controlled. Endorses adherence with his statin and antihypertensive. From a cardiac standpoint he has no dyspnea or pedal edema. Past Medical History:  Diagnosis Date   Atrial fibrillation (HCC)    Cellulitis and abscess of left leg 06/2016   CHF (congestive heart failure) (HCC)    Diabetes (HCC)    Dyspnea     Past Surgical History:  Procedure Laterality Date   AMPUTATION Left 06/24/2016   Procedure: FOOT FIFTH RAY TOE AMPUTATION;  Surgeon: [name], MD;  Location: [facility] OR;  Service: Orthopedics;  Laterality: Left;   IR PARACENTESIS  11/12/2020   IR PARACENTESIS  11/13/2020   IR PARACENTESIS  11/16/2020   open heart surgery     As a child.  [age] years old.  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    RIGHT HEART CATH N/A 02/17/2021    Procedure: RIGHT HEART CATH;  Surgeon: [name], MD;  Location: [facility] INVASIVE CV LAB;  Service: Cardiovascular;  Laterality: N/A;   RIGHT/LEFT HEART CATH AND CORONARY ANGIOGRAPHY N/A 03/30/2017   Procedure: Right/Left Heart Cath and Coronary Angiography;  Surgeon: [name], MD;  Location: [facility] INVASIVE CV LAB;  Service: Cardiovascular;  Laterality: N/A;    Family History  Problem Relation Age of Onset   Diabetes Mellitus II Mother        ESRD   Diabetes Mellitus II Brother    Emphysema Father     Social History   Socioeconomic History   Marital status: Divorced    Spouse name: Not on file   Number of children: 1   Years of education: Not on file   Highest education level: Not on file  Occupational History   Not on file  Tobacco Use   Smoking status: Never   Smokeless tobacco: Never  Vaping Use   Vaping Use: Never used  Substance and Sexual Activity   Alcohol use: No    Comment: gave up drinking ~20 years ago   Drug use: Yes    Types: Marijuana    Comment: daily   Sexual activity: Not on file  Other Topics Concern   Not on file  Social History Narrative   Lives alone    Social Determinants of Health   Financial Resource Strain: Low Risk  (  10/12/2022)   Overall Financial Resource Strain (CARDIA)    Difficulty of Paying Living Expenses: Not hard at all  Food Insecurity: No Food Insecurity (10/12/2022)   Hunger Vital Sign    Worried About Running Out of Food in the Last Year: Never true    Ran Out of Food in the Last Year: Never true  Transportation Needs: No Transportation Needs (10/12/2022)   PRAPARE - Administrator, Civil Service (Medical): No    Lack of Transportation (Non-Medical): No  Physical Activity: Insufficiently Active (10/12/2022)   Exercise Vital Sign    Days of Exercise per Week: 2 days    Minutes of Exercise per Session: 20 min  Stress: No Stress Concern Present (10/12/2022)   [employer] of Occupational Health -  Occupational Stress Questionnaire    Feeling of Stress : Not at all  Social Connections: Socially Isolated (10/12/2022)   Social Connection and Isolation Panel [NHANES]    Frequency of Communication with Friends and Family: More than three times a week    Frequency of Social Gatherings with Friends and Family: Three times a week    Attends Religious Services: Never    Active Member of Clubs or Organizations: No    Attends Banker Meetings: Never    Marital Status: Divorced    Allergies  Allergen Reactions   Tadalafil Other (See Comments)    Patient experiences a head ache with this medication.    Outpatient Medications Prior to Visit  Medication Sig Dispense Refill   albuterol (PROVENTIL HFA;VENTOLIN HFA) 108 (90 Base) MCG/ACT inhaler Inhale 2 puffs into the lungs every 6 (six) hours as needed for wheezing or shortness of breath. 1 Inhaler 0   amLODipine (NORVASC) 10 MG tablet Take 1 tablet (10 mg total) by mouth daily. 90 tablet 1   Misc. Devices MISC Blood pressure monitor.  Diagnosis- hypertension 1 each 0   spironolactone (ALDACTONE) 25 MG tablet Take 2 tablets (50 mg total) by mouth daily. 180 tablet 3   torsemide (DEMADEX) 20 MG tablet Take 2 tablets (40 mg total) by mouth every morning AND 1 tablet (20 mg total) every evening. 270 tablet 3   atorvastatin (LIPITOR) 20 MG tablet TAKE 1 TABLET (20 MG TOTAL) BY MOUTH DAILY. 90 tablet 1   dapagliflozin propanediol (FARXIGA) 10 MG TABS tablet Take 1 tablet (10 mg total) by mouth daily before breakfast. 90 tablet 1   gabapentin (NEURONTIN) 300 MG capsule Take 1 capsule (300 mg total) by mouth 2 (two) times daily. 180 capsule 1   glipiZIDE (GLUCOTROL) 5 MG tablet Take 0.5 tablets (2.5 mg total) by mouth 2 (two) times daily before a meal. 90 tablet 1   rivaroxaban (XARELTO) 20 MG TABS tablet Take 1 tablet (20 mg total) by mouth once daily with supper. (Needs PCP office visit for more refills). 90 tablet 1   predniSONE  (DELTASONE) 20 MG tablet Take 1 tablet (20 mg total) by mouth daily with breakfast. (Patient not taking: Reported on 02/22/2023) 5 tablet 0   No facility-administered medications prior to visit.     ROS Review of Systems  Constitutional:  Negative for activity change and appetite change.  HENT:  Negative for sinus pressure and sore throat.   Respiratory:  Negative for chest tightness, shortness of breath and wheezing.   Cardiovascular:  Negative for chest pain and palpitations.  Gastrointestinal:  Negative for abdominal distention, abdominal pain and constipation.  Genitourinary: Negative.   Musculoskeletal:  See HPI  Psychiatric/Behavioral:  Negative for behavioral problems and dysphoric mood.     Objective:  BP 125/64   Pulse 90   Temp 97.8 F (36.6 C) (Oral)   Ht 6\' 1"  (1.854 m)   Wt 251 lb 3.2 oz (113.9 kg)   SpO2 97%   BMI 33.14 kg/m      02/22/2023    2:25 PM 10/12/2022    3:39 PM 08/23/2022    3:00 PM  BP/Weight  Systolic BP 125 131 134  Diastolic BP 64 72 66  Wt. (Lbs) 251.2 243.8   BMI 33.14 kg/m2 32.17 kg/m2       Physical Exam Constitutional:      Appearance: He is well-developed.  Cardiovascular:     Rate and Rhythm: Normal rate.     Heart sounds: Normal heart sounds. No murmur heard. Pulmonary:     Effort: Pulmonary effort is normal.     Breath sounds: Normal breath sounds. No wheezing or rales.  Chest:     Chest wall: No tenderness.  Abdominal:     General: Bowel sounds are normal. There is no distension.     Palpations: Abdomen is soft. There is no mass.     Tenderness: There is no abdominal tenderness.  Musculoskeletal:        General: Normal range of motion.     Right lower leg: No edema.     Left lower leg: No edema.  Neurological:     Mental Status: He is alert and oriented to person, place, and time.     Comments: Normal strength in bilateral upper and bilateral lower extremities.  Psychiatric:        Mood and Affect: Mood  normal.        Latest Ref Rng & Units 07/28/2022    1:43 PM 07/15/2022    1:58 PM 06/20/2022    2:45 PM  CMP  Glucose 70 - 99 mg/dL 161  096  045   BUN 8 - 23 mg/dL 38  31  28   Creatinine 0.61 - 1.24 mg/dL 4.09  8.11  9.14   Sodium 135 - 145 mmol/L 139  139  138   Potassium 3.5 - 5.1 mmol/L 4.1  4.0  4.4   Chloride 98 - 111 mmol/L 101  104  100   CO2 22 - 32 mmol/L 25  25  22    Calcium 8.9 - 10.3 mg/dL 9.5  9.3  9.6   Total Protein 6.5 - 8.1 g/dL  7.8  7.5   Total Bilirubin 0.3 - 1.2 mg/dL  1.2  0.8   Alkaline Phos 38 - 126 U/L  112  126   AST 15 - 41 U/L  17  15   ALT 0 - 44 U/L  21  14     Lipid Panel     Component Value Date/Time   CHOL 93 07/15/2022 1358   CHOL 103 06/29/2021 1620   TRIG 50 07/15/2022 1358   HDL 39 (L) 07/15/2022 1358   HDL 46 06/29/2021 1620   CHOLHDL 2.4 07/15/2022 1358   VLDL 10 07/15/2022 1358   LDLCALC 44 07/15/2022 1358   LDLCALC 42 06/29/2021 1620    CBC    Component Value Date/Time   WBC 7.9 07/15/2022 1358   RBC 4.32 07/15/2022 1358   HGB 14.2 07/15/2022 1358   HGB 14.1 09/29/2021 1453   HCT 41.8 07/15/2022 1358   HCT 41.5 09/29/2021 1453   PLT 230 07/15/2022  1358   PLT 274 09/29/2021 1453   MCV 96.8 07/15/2022 1358   MCV 95 09/29/2021 1453   MCH 32.9 07/15/2022 1358   MCHC 34.0 07/15/2022 1358   RDW 13.5 07/15/2022 1358   RDW 12.3 09/29/2021 1453   LYMPHSABS 1.2 09/29/2021 1453   MONOABS 0.4 11/18/2020 0120   EOSABS 0.4 09/29/2021 1453   BASOSABS 0.1 09/29/2021 1453    Lab Results  Component Value Date   HGBA1C 6.6 02/22/2023    Assessment & Plan:  1. Type 2 diabetes mellitus with complication, without long-term current use of insulin (HCC) Treated with A1c of 6.6 Continue current regimen Counseled on Diabetic diet, my plate method, 161 minutes of moderate intensity exercise/week Blood sugar logs with fasting goals of 80-120 mg/dl, random of less than 096 and in the event of sugars less than 60 mg/dl or greater than  045 mg/dl encouraged to notify the clinic. Advised on the need for annual eye exams, annual foot exams, Pneumonia vaccine. - POCT glycosylated hemoglobin (Hb A1C) - CMP14+EGFR - Microalbumin/Creatinine Ratio, Urine - Ambulatory referral to Ophthalmology - atorvastatin (LIPITOR) 20 MG tablet; Take 1 tablet (20 mg total) by mouth daily.  Dispense: 90 tablet; Refill: 1 - dapagliflozin propanediol (FARXIGA) 10 MG TABS tablet; Take 1 tablet (10 mg total) by mouth daily before breakfast.  Dispense: 90 tablet; Refill: 1 - glipiZIDE (GLUCOTROL) 5 MG tablet; Take 0.5 tablets (2.5 mg total) by mouth 2 (two) times daily before a meal.  Dispense: 90 tablet; Refill: 1  2. Left leg pain Symptoms have been present for over 6 months Discussed the possibility that the statin could be the culprit however he has been on a statin for a long time and symptoms have been present in the last 6 months - Aldolase - CK - Mitochondrial Antibodies - Ambulatory referral to Orthopedics - ANA - Sedimentation Rate  3. Diabetic polyneuropathy associated with type 2 diabetes mellitus (HCC) Stable - gabapentin (NEURONTIN) 300 MG capsule; Take 1 capsule (300 mg total) by mouth 2 (two) times daily.  Dispense: 180 capsule; Refill: 1  4. Paroxysmal atrial fibrillation (HCC) Currently in sinus rhythm - rivaroxaban (XARELTO) 20 MG TABS tablet; Take 1 tablet (20 mg total) by mouth once daily with supper. (Needs PCP office visit for more refills).  Dispense: 90 tablet; Refill: 1   Meds ordered this encounter  Medications   atorvastatin (LIPITOR) 20 MG tablet    Sig: Take 1 tablet (20 mg total) by mouth daily.    Dispense:  90 tablet    Refill:  1   dapagliflozin propanediol (FARXIGA) 10 MG TABS tablet    Sig: Take 1 tablet (10 mg total) by mouth daily before breakfast.    Dispense:  90 tablet    Refill:  1   gabapentin (NEURONTIN) 300 MG capsule    Sig: Take 1 capsule (300 mg total) by mouth 2 (two) times daily.     Dispense:  180 capsule    Refill:  1   glipiZIDE (GLUCOTROL) 5 MG tablet    Sig: Take 0.5 tablets (2.5 mg total) by mouth 2 (two) times daily before a meal.    Dispense:  90 tablet    Refill:  1   rivaroxaban (XARELTO) 20 MG TABS tablet    Sig: Take 1 tablet (20 mg total) by mouth once daily with supper. (Needs PCP office visit for more refills).    Dispense:  90 tablet    Refill:  1  Follow-up: Return in about 6 months (around 08/24/2023).       [name], MD, FAAFP. [facility] [place], [place] [phone]   02/22/2023, 5:34 PM

## 2023-02-22 NOTE — Progress Notes (Signed)
Left leg pain. 

## 2023-02-23 ENCOUNTER — Encounter: Payer: Self-pay | Admitting: Orthopedic Surgery

## 2023-02-23 ENCOUNTER — Other Ambulatory Visit (INDEPENDENT_AMBULATORY_CARE_PROVIDER_SITE_OTHER): Payer: Medicare Other

## 2023-02-23 ENCOUNTER — Other Ambulatory Visit: Payer: Self-pay

## 2023-02-23 ENCOUNTER — Ambulatory Visit (INDEPENDENT_AMBULATORY_CARE_PROVIDER_SITE_OTHER): Payer: Medicare Other | Admitting: Orthopedic Surgery

## 2023-02-23 DIAGNOSIS — M545 Low back pain, unspecified: Secondary | ICD-10-CM

## 2023-02-23 DIAGNOSIS — G8929 Other chronic pain: Secondary | ICD-10-CM

## 2023-02-23 DIAGNOSIS — M4316 Spondylolisthesis, lumbar region: Secondary | ICD-10-CM | POA: Diagnosis not present

## 2023-02-23 LAB — MICROALBUMIN / CREATININE URINE RATIO
Creatinine, Urine: 31.7 mg/dL
Microalb/Creat Ratio: 42 mg/g creat — ABNORMAL HIGH (ref 0–29)
Microalbumin, Urine: 13.4 ug/mL

## 2023-02-23 MED ORDER — PREDNISONE 10 MG PO TABS
10.0000 mg | ORAL_TABLET | Freq: Every day | ORAL | 0 refills | Status: DC
  Filled 2023-02-23: qty 30, 30d supply, fill #0

## 2023-02-24 ENCOUNTER — Other Ambulatory Visit: Payer: Self-pay | Admitting: Family Medicine

## 2023-02-24 DIAGNOSIS — N1832 Chronic kidney disease, stage 3b: Secondary | ICD-10-CM

## 2023-02-24 LAB — SEDIMENTATION RATE: Sed Rate: 37 mm/h — ABNORMAL HIGH (ref 0–30)

## 2023-02-24 LAB — CMP14+EGFR
ALT: 12 IU/L (ref 0–44)
AST: 11 IU/L (ref 0–40)
Albumin/Globulin Ratio: 1.5
Albumin: 4.7 g/dL (ref 3.9–4.9)
Alkaline Phosphatase: 143 IU/L — ABNORMAL HIGH (ref 44–121)
BUN/Creatinine Ratio: 19 (ref 10–24)
BUN: 34 mg/dL — ABNORMAL HIGH (ref 8–27)
Bilirubin Total: 0.7 mg/dL (ref 0.0–1.2)
CO2: 23 mmol/L (ref 20–29)
Calcium: 9.1 mg/dL (ref 8.6–10.2)
Chloride: 99 mmol/L (ref 96–106)
Creatinine, Ser: 1.82 mg/dL — ABNORMAL HIGH (ref 0.76–1.27)
Globulin, Total: 3.2 g/dL (ref 1.5–4.5)
Glucose: 118 mg/dL — ABNORMAL HIGH (ref 70–99)
Potassium: 4.3 mmol/L (ref 3.5–5.2)
Sodium: 139 mmol/L (ref 134–144)
Total Protein: 7.9 g/dL (ref 6.0–8.5)
eGFR: 40 mL/min/1.73 — ABNORMAL LOW

## 2023-02-24 LAB — CK: Total CK: 35 U/L — ABNORMAL LOW (ref 41–331)

## 2023-02-24 LAB — MITOCHONDRIAL ANTIBODIES: Mitochondrial Ab: <20 U (ref 0.0–20.0)

## 2023-02-24 LAB — ALDOLASE: Aldolase: 3.7 U/L (ref 3.3–10.3)

## 2023-02-24 LAB — ANA: Anti Nuclear Antibody (ANA): NEGATIVE

## 2023-02-28 ENCOUNTER — Other Ambulatory Visit (HOSPITAL_BASED_OUTPATIENT_CLINIC_OR_DEPARTMENT_OTHER): Payer: Medicare Other | Admitting: Pharmacist

## 2023-02-28 DIAGNOSIS — Z7984 Long term (current) use of oral hypoglycemic drugs: Secondary | ICD-10-CM

## 2023-02-28 DIAGNOSIS — E1165 Type 2 diabetes mellitus with hyperglycemia: Secondary | ICD-10-CM

## 2023-02-28 NOTE — Progress Notes (Signed)
Pharmacy Quality Measure Review  This patient is appearing on a report for being at risk of failing the Glycemic Status Assessment in Diabetes measure this calendar year.     Last documented A1c or GMI 6.6% on 02/22/23.  Lab Results  Component Value Date   HGBA1C 6.6 02/22/2023     Submitting CPT II code to close payor gap.   Catie Eppie Gibson, PharmD, BCACP, CPP Clinical Pharmacist [facility] [phone]

## 2023-03-01 ENCOUNTER — Encounter: Payer: Self-pay | Admitting: Orthopedic Surgery

## 2023-03-01 NOTE — Addendum Note (Signed)
Addended by: Nilda Simmer T on: 03/01/2023 07:49 AM   Modules accepted: Level of Service

## 2023-03-01 NOTE — Progress Notes (Addendum)
Office Visit Note   Patient: Paul Mclaughlin           Date of Birth: 1956/06/02           MRN: [id number] Visit Date: 02/23/2023              Requested by: [name], MD [address] [place] [address] [place],  [place] [address] PCP: [name], MD  Chief Complaint  Patient presents with   Left Leg - Weakness, Pain      HPI: Patient is a 67 year old gentleman who presents with left lower extremity radicular symptoms and weakness.  He states that his sed rate is 37.  ANA was negative.  Patient states that as the day goes by his leg feels heavy.  He denies any back pain.  Denies any knee pain.  Assessment & Plan: Visit Diagnoses:  1. Chronic low back pain, unspecified back pain laterality, unspecified whether sciatica present   2. Spondylolisthesis, lumbar region     Plan: Prescription for prednisone, hamstring stretching.  Follow-Up Instructions: No follow-ups on file.   Ortho Exam  Patient is alert, oriented, no adenopathy, well-dressed, normal affect, normal respiratory effort. Examination patient has tight hamstrings.  He has radicular pain down to the hamstrings and posterior calf on the left.  Patient has a negative straight leg raise no pain with range of motion of the hip or knee.  Patient does have pain with sitting.  Radiograph shows a part Despec at L5-S1 grade 1.  There is calcification of the aorta but no aneurysm.  Ankle-brachial indices are normal.  Imaging: No results found. No images are attached to the encounter.  Labs: Lab Results  Component Value Date   HGBA1C 6.6 02/22/2023   HGBA1C 7.0 08/23/2022   HGBA1C 9.1 (A) 05/17/2022   ESRSEDRATE 37 (H) 02/22/2023   CRP 0.7 11/16/2020   CRP 0.7 11/15/2020   CRP 0.7 11/14/2020   REPTSTATUS 11/17/2020 FINAL 11/12/2020   REPTSTATUS 11/13/2020 FINAL 11/12/2020   GRAMSTAIN  11/12/2020    WBC PRESENT, PREDOMINANTLY MONONUCLEAR NO ORGANISMS SEEN CYTOSPIN SMEAR Performed at [facility],  [address] [place], [place] [address]    CULT  11/12/2020    NO GROWTH 5 DAYS Performed at [facility], [address], [place], [place] [address]      Lab Results  Component Value Date   ALBUMIN 4.7 02/22/2023   ALBUMIN 4.4 07/15/2022   ALBUMIN 4.7 06/20/2022    Lab Results  Component Value Date   MG 2.2 11/16/2020   MG 2.2 11/15/2020   MG 2.0 11/14/2020   No results found for: "VD25OH"  No results found for: "PREALBUMIN"    Latest Ref Rng & Units 07/15/2022    1:58 PM 09/29/2021    2:53 PM 02/17/2021    1:59 PM  CBC EXTENDED  WBC 4.0 - 10.5 K/uL 7.9  8.2    RBC 4.22 - 5.81 MIL/uL 4.32  4.38    Hemoglobin 13.0 - 17.0 g/dL 21.3  08.6  57.8   HCT 39.0 - 52.0 % 41.8  41.5  32.0   Platelets 150 - 400 K/uL 230  274    NEUT# 1.4 - 7.0 x10E3/uL  6.0    Lymph# 0.7 - 3.1 x10E3/uL  1.2       There is no height or weight on file to calculate BMI.  Orders:  Orders Placed This Encounter  Procedures   XR Lumbar Spine 2-3 Views  Meds ordered this encounter  Medications   predniSONE (DELTASONE) 10 MG tablet    Sig: Take 1 tablet (10 mg total) by mouth daily with breakfast.    Dispense:  30 tablet    Refill:  0     Procedures: No procedures performed  Clinical Data: No additional findings.  ROS:  All other systems negative, except as noted in the HPI. Review of Systems  Objective: Vital Signs: There were no vitals taken for this visit.  Specialty Comments:  No specialty comments available.  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, subsequent encounter (HCC) 08/23/2022   Cirrhosis of liver with ascites, unspecified hepatic cirrhosis type (HCC) 08/23/2022   Hyperlipemia 07/15/2022   Ascites    Abnormal liver ultrasound    Right-sided heart failure (HCC)    COVID-19 virus infection 11/11/2020   Acute on chronic diastolic  (congestive) heart failure (HCC) 11/11/2020   Acute on chronic diastolic CHF (congestive heart failure) (HCC) 11/10/2020   GERD without esophagitis 11/10/2020   Achilles tendon contracture, right 01/30/2018   Idiopathic chronic venous hypertension of both lower extremities with inflammation 01/30/2018   Peripheral arterial disease (HCC) 05/29/2017   Chronic kidney disease, stage 3a (HCC) 05/24/2017   Hypertensive heart disease 03/30/2017   Class 3 severe obesity due to excess calories with serious comorbidity and body mass index (BMI) of 40.0 to 44.9 in adult [facility]) 01/09/2017   Pulmonary HTN (HCC) 12/11/2016   Type 2 diabetes mellitus with stage 3a chronic kidney disease, without long-term current use of insulin (HCC) 12/11/2016   Diastolic CHF (HCC) 11/24/2016   Insomnia 11/24/2016   Foot amputation status, left 08/22/2016   Atrial fibrillation, chronic (HCC) 06/30/2016   Essential hypertension 06/30/2016   Cellulitis of left foot 06/22/2016   Controlled type 2 diabetes mellitus with hyperglycemia (HCC) 06/22/2016   Past Medical History:  Diagnosis Date   Atrial fibrillation (HCC)    Cellulitis and abscess of left leg 06/2016   CHF (congestive heart failure) (HCC)    Diabetes (HCC)    Dyspnea     Family History  Problem Relation Age of Onset   Diabetes Mellitus II Mother        ESRD   Diabetes Mellitus II Brother    Emphysema Father     Past Surgical History:  Procedure Laterality Date   AMPUTATION Left 06/24/2016   Procedure: FOOT FIFTH RAY TOE AMPUTATION;  Surgeon: [name], MD;  Location: [facility] OR;  Service: Orthopedics;  Laterality: Left;   IR PARACENTESIS  11/12/2020   IR PARACENTESIS  11/13/2020   IR PARACENTESIS  11/16/2020   open heart surgery     As a child.  [age] years old.  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    RIGHT HEART CATH N/A 02/17/2021   Procedure: RIGHT HEART CATH;  Surgeon: [name], MD;  Location[facility] INVASIVE CV LAB;  Service: Cardiovascular;  Laterality: N/A;    RIGHT/LEFT HEART CATH AND CORONARY ANGIOGRAPHY N/A 03/30/2017   Procedure: Right/Left Heart Cath and Coronary Angiography;  Surgeon: [name], MD;  Location: [facility] INVASIVE CV LAB;  Service: Cardiovascular;  Laterality: N/A;   Social History   Occupational History   Not on file  Tobacco Use   Smoking status: Never   Smokeless tobacco: Never  Vaping Use   Vaping Use: Never used  Substance and Sexual Activity  Alcohol use: No    Comment: gave up drinking ~20 years ago   Drug use: Yes    Types: Marijuana    Comment: daily   Sexual activity: Not on file

## 2023-03-09 ENCOUNTER — Other Ambulatory Visit: Payer: Self-pay

## 2023-03-23 ENCOUNTER — Other Ambulatory Visit: Payer: Self-pay

## 2023-03-23 ENCOUNTER — Ambulatory Visit (INDEPENDENT_AMBULATORY_CARE_PROVIDER_SITE_OTHER): Payer: Medicare Other | Admitting: Orthopedic Surgery

## 2023-03-23 DIAGNOSIS — M545 Low back pain, unspecified: Secondary | ICD-10-CM | POA: Diagnosis not present

## 2023-03-23 DIAGNOSIS — G8929 Other chronic pain: Secondary | ICD-10-CM | POA: Diagnosis not present

## 2023-03-23 MED ORDER — PREDNISONE 10 MG PO TABS
10.0000 mg | ORAL_TABLET | Freq: Every day | ORAL | 0 refills | Status: DC
  Filled 2023-03-23 – 2023-03-30 (×2): qty 30, 30d supply, fill #0

## 2023-03-30 ENCOUNTER — Other Ambulatory Visit: Payer: Self-pay

## 2023-03-31 ENCOUNTER — Encounter: Payer: Self-pay | Admitting: Orthopedic Surgery

## 2023-03-31 ENCOUNTER — Other Ambulatory Visit: Payer: Self-pay

## 2023-03-31 NOTE — Progress Notes (Signed)
Office Visit Note   Patient: Paul Mclaughlin           Date of Birth: November 06, 1955           MRN: [id number] Visit Date: 03/23/2023              Requested by: [name], MD [address] [place],  [place] [address] PCP: [name], MD  Chief Complaint  Patient presents with   Left Leg - Pain      HPI: Left lumbar radicular pain, resolving  Assessment & Plan: Visit Diagnoses:  1. Chronic low back pain, unspecified back pain laterality, unspecified whether sciatica present     Plan: rx for prednisone provided for breakthrough pain  Follow-Up Instructions: No follow-ups on file.   Ortho Exam  Patient is alert, oriented, no adenopathy, well-dressed, normal affect, normal respiratory effort. Radicular pain resolved with prednisone no focal weakness  Imaging: No results found. No images are attached to the encounter.  Labs: Lab Results  Component Value Date   HGBA1C 6.6 02/22/2023   HGBA1C 7.0 08/23/2022   HGBA1C 9.1 (A) 05/17/2022   ESRSEDRATE 37 (H) 02/22/2023   CRP 0.7 11/16/2020   CRP 0.7 11/15/2020   CRP 0.7 11/14/2020   REPTSTATUS 11/17/2020 FINAL 11/12/2020   REPTSTATUS 11/13/2020 FINAL 11/12/2020   GRAMSTAIN  11/12/2020    WBC PRESENT, PREDOMINANTLY MONONUCLEAR NO ORGANISMS SEEN CYTOSPIN SMEAR Performed at [facility], [address], [place], [place] [address]    CULT  11/12/2020    NO GROWTH 5 DAYS Performed at [facility], [address] [place], [place] [address]      Lab Results  Component Value Date   ALBUMIN 4.7 02/22/2023   ALBUMIN 4.4 07/15/2022   ALBUMIN 4.7 06/20/2022    Lab Results  Component Value Date   MG 2.2 11/16/2020   MG 2.2 11/15/2020   MG 2.0 11/14/2020   No results found for: "VD25OH"  No results found for: "PREALBUMIN"    Latest Ref Rng & Units 07/15/2022    1:58 PM 09/29/2021    2:53 PM 02/17/2021    1:59 PM  CBC EXTENDED  WBC 4.0 - 10.5 K/uL 7.9  8.2    RBC 4.22 - 5.81  MIL/uL 4.32  4.38    Hemoglobin 13.0 - 17.0 g/dL 74.2  59.5  63.8   HCT 39.0 - 52.0 % 41.8  41.5  32.0   Platelets 150 - 400 K/uL 230  274    NEUT# 1.4 - 7.0 x10E3/uL  6.0    Lymph# 0.7 - 3.1 x10E3/uL  1.2       There is no height or weight on file to calculate BMI.  Orders:  No orders of the defined types were placed in this encounter.  Meds ordered this encounter  Medications   predniSONE (DELTASONE) 10 MG tablet    Sig: Take 1 tablet (10 mg total) by mouth daily with breakfast.    Dispense:  30 tablet    Refill:  0     Procedures: No procedures performed  Clinical Data: No additional findings.  ROS:  All other systems negative, except as noted in the HPI. Review of Systems  Objective: Vital Signs: There were no vitals taken for this visit.  Specialty Comments:  No specialty comments available.  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, subsequent encounter (HCC) 08/23/2022   Cirrhosis of liver with ascites, unspecified hepatic cirrhosis type (HCC) 08/23/2022   Hyperlipemia 07/15/2022   Ascites    Abnormal liver ultrasound    Right-sided heart failure (HCC)    COVID-19 virus infection 11/11/2020   Acute on chronic diastolic (congestive) heart failure (HCC) 11/11/2020   Acute on chronic diastolic CHF (congestive heart failure) (HCC) 11/10/2020   GERD without esophagitis 11/10/2020   Achilles tendon contracture, right 01/30/2018   Idiopathic chronic venous hypertension of both lower extremities with inflammation 01/30/2018   Peripheral arterial disease (HCC) 05/29/2017   Chronic kidney disease, stage 3a (HCC) 05/24/2017   Hypertensive heart disease 03/30/2017   Class 3 severe obesity due to excess calories with serious comorbidity and body mass index (BMI) of 40.0 to 44.9 in adult [facility]) 01/09/2017   Pulmonary HTN (HCC) 12/11/2016    Type 2 diabetes mellitus with stage 3a chronic kidney disease, without long-term current use of insulin (HCC) 12/11/2016   Diastolic CHF (HCC) 11/24/2016   Insomnia 11/24/2016   Foot amputation status, left 08/22/2016   Atrial fibrillation, chronic (HCC) 06/30/2016   Essential hypertension 06/30/2016   Cellulitis of left foot 06/22/2016   Controlled type 2 diabetes mellitus with hyperglycemia (HCC) 06/22/2016   Past Medical History:  Diagnosis Date   Atrial fibrillation (HCC)    Cellulitis and abscess of left leg 06/2016   CHF (congestive heart failure) (HCC)    Diabetes (HCC)    Dyspnea     Family History  Problem Relation Age of Onset   Diabetes Mellitus II Mother        ESRD   Diabetes Mellitus II Brother    Emphysema Father     Past Surgical History:  Procedure Laterality Date   AMPUTATION Left 06/24/2016   Procedure: FOOT FIFTH RAY TOE AMPUTATION;  Surgeon: [name], MD;  Location: [facility] OR;  Service: Orthopedics;  Laterality: Left;   IR PARACENTESIS  11/12/2020   IR PARACENTESIS  11/13/2020   IR PARACENTESIS  11/16/2020   open heart surgery     As a child.  [age] years old.  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    RIGHT HEART CATH N/A 02/17/2021   Procedure: RIGHT HEART CATH;  Surgeon: [name], MD;  Location[facility] INVASIVE CV LAB;  Service: Cardiovascular;  Laterality: N/A;   RIGHT/LEFT HEART CATH AND CORONARY ANGIOGRAPHY N/A 03/30/2017   Procedure: Right/Left Heart Cath and Coronary Angiography;  Surgeon: [name], MD;  Location: [facility] INVASIVE CV LAB;  Service: Cardiovascular;  Laterality: N/A;   Social History   Occupational History   Not on file  Tobacco Use   Smoking status: Never   Smokeless tobacco: Never  Vaping Use   Vaping status: Never Used  Substance and Sexual Activity   Alcohol use: No    Comment: gave up drinking ~20 years ago   Drug use: Yes    Types: Marijuana    Comment: daily   Sexual activity: Not on file

## 2023-05-25 ENCOUNTER — Other Ambulatory Visit: Payer: Self-pay | Admitting: Family Medicine

## 2023-05-25 ENCOUNTER — Other Ambulatory Visit: Payer: Self-pay

## 2023-05-25 DIAGNOSIS — E1159 Type 2 diabetes mellitus with other circulatory complications: Secondary | ICD-10-CM

## 2023-05-25 MED ORDER — AMLODIPINE BESYLATE 10 MG PO TABS
10.0000 mg | ORAL_TABLET | Freq: Every day | ORAL | 1 refills | Status: DC
  Filled 2023-05-25: qty 90, 90d supply, fill #0
  Filled 2023-08-25: qty 90, 90d supply, fill #1

## 2023-05-25 NOTE — Telephone Encounter (Signed)
Norvasc is on the patient medication list. However,  last medication refill noted in 08/23/22. Last OV 02/22/2023. Norvasc is not noted in provider Assessment and plan. Routing to PCP. Please advise

## 2023-05-26 ENCOUNTER — Other Ambulatory Visit: Payer: Self-pay

## 2023-06-01 ENCOUNTER — Other Ambulatory Visit: Payer: Self-pay

## 2023-06-05 LAB — LAB REPORT - SCANNED
Albumin, Urine POC: 8.4
Creatinine, POC: 13.1 mg/dL
EGFR: 53
Microalb Creat Ratio: 64

## 2023-06-06 ENCOUNTER — Other Ambulatory Visit: Payer: Self-pay | Admitting: Internal Medicine

## 2023-06-06 DIAGNOSIS — I38 Endocarditis, valve unspecified: Secondary | ICD-10-CM

## 2023-06-06 DIAGNOSIS — E1122 Type 2 diabetes mellitus with diabetic chronic kidney disease: Secondary | ICD-10-CM

## 2023-06-06 DIAGNOSIS — N1832 Chronic kidney disease, stage 3b: Secondary | ICD-10-CM

## 2023-06-06 DIAGNOSIS — K7469 Other cirrhosis of liver: Secondary | ICD-10-CM

## 2023-06-06 DIAGNOSIS — Z8249 Family history of ischemic heart disease and other diseases of the circulatory system: Secondary | ICD-10-CM

## 2023-06-12 ENCOUNTER — Ambulatory Visit
Admission: RE | Admit: 2023-06-12 | Discharge: 2023-06-12 | Disposition: A | Discharge status: Home / Self Care | Payer: Medicare Other | Source: Ambulatory Visit | Attending: Internal Medicine | Admitting: Internal Medicine

## 2023-06-12 DIAGNOSIS — N1832 Chronic kidney disease, stage 3b: Secondary | ICD-10-CM

## 2023-06-12 DIAGNOSIS — I38 Endocarditis, valve unspecified: Secondary | ICD-10-CM

## 2023-06-12 DIAGNOSIS — Z8249 Family history of ischemic heart disease and other diseases of the circulatory system: Secondary | ICD-10-CM

## 2023-06-12 DIAGNOSIS — K7469 Other cirrhosis of liver: Secondary | ICD-10-CM

## 2023-06-12 DIAGNOSIS — E1122 Type 2 diabetes mellitus with diabetic chronic kidney disease: Secondary | ICD-10-CM

## 2023-06-15 LAB — HM DIABETES EYE EXAM

## 2023-07-03 ENCOUNTER — Other Ambulatory Visit (HOSPITAL_COMMUNITY): Payer: Self-pay | Admitting: Cardiology

## 2023-07-04 ENCOUNTER — Other Ambulatory Visit: Payer: Self-pay

## 2023-07-04 MED ORDER — SPIRONOLACTONE 25 MG PO TABS
50.0000 mg | ORAL_TABLET | Freq: Every day | ORAL | 0 refills | Status: DC
  Filled 2023-07-04: qty 180, 90d supply, fill #0

## 2023-08-25 ENCOUNTER — Other Ambulatory Visit (HOSPITAL_COMMUNITY): Payer: Self-pay | Admitting: Cardiology

## 2023-08-25 ENCOUNTER — Other Ambulatory Visit: Payer: Self-pay | Admitting: Family Medicine

## 2023-08-25 ENCOUNTER — Other Ambulatory Visit: Payer: Self-pay

## 2023-08-25 DIAGNOSIS — E118 Type 2 diabetes mellitus with unspecified complications: Secondary | ICD-10-CM

## 2023-08-25 DIAGNOSIS — I480 Paroxysmal atrial fibrillation: Secondary | ICD-10-CM

## 2023-08-25 DIAGNOSIS — E1142 Type 2 diabetes mellitus with diabetic polyneuropathy: Secondary | ICD-10-CM

## 2023-08-25 MED ORDER — ATORVASTATIN CALCIUM 20 MG PO TABS
20.0000 mg | ORAL_TABLET | Freq: Every day | ORAL | 0 refills | Status: DC
  Filled 2023-08-25: qty 90, 90d supply, fill #0

## 2023-08-25 MED ORDER — RIVAROXABAN 20 MG PO TABS
20.0000 mg | ORAL_TABLET | Freq: Every day | ORAL | 0 refills | Status: DC
  Filled 2023-08-25: qty 90, 90d supply, fill #0

## 2023-08-25 MED ORDER — DAPAGLIFLOZIN PROPANEDIOL 10 MG PO TABS
10.0000 mg | ORAL_TABLET | Freq: Every day | ORAL | 0 refills | Status: DC
  Filled 2023-08-25: qty 90, 90d supply, fill #0

## 2023-08-25 MED ORDER — GLIPIZIDE 5 MG PO TABS
2.5000 mg | ORAL_TABLET | Freq: Two times a day (BID) | ORAL | 0 refills | Status: DC
  Filled 2023-08-25: qty 90, 90d supply, fill #0

## 2023-08-25 MED ORDER — GABAPENTIN 300 MG PO CAPS
300.0000 mg | ORAL_CAPSULE | Freq: Two times a day (BID) | ORAL | 0 refills | Status: DC
  Filled 2023-08-25: qty 180, 90d supply, fill #0

## 2023-08-25 NOTE — Telephone Encounter (Signed)
Requested Prescriptions  Pending Prescriptions Disp Refills   dapagliflozin propanediol (FARXIGA) 10 MG TABS tablet 90 tablet 0    Sig: Take 1 tablet (10 mg total) by mouth daily before breakfast.     Endocrinology:  Diabetes - SGLT2 Inhibitors Failed - 08/25/2023  1:44 PM      Failed - HBA1C is between 0 and 7.9 and within 180 days    HbA1c, POC (controlled diabetic range)  Date Value Ref Range Status  02/22/2023 6.6 0.0 - 7.0 % Final         Failed - Valid encounter within last 6 months    Recent Outpatient Visits           6 months ago Type 2 diabetes mellitus with complication, without long-term current use of insulin (HCC)   [place] [facility] Dept Of [place]. [facility] [name], MD   10 months ago Encounter for [employer] annual wellness exam   [place] [facility] [place] - A Dept Of [place]. [facility] [name], MD   1 year ago Type 2 diabetes mellitus with complication, without long-term current use of insulin (HCC)   [place] [facility] [place] - A Dept Of [place]. [facility] [name], MD   1 year ago Type 2 diabetes mellitus with complication, without long-term current use of insulin (HCC)   [place] [facility] [place] - A Dept Of [place]. [facility] [name], MD   1 year ago Type 2 diabetes mellitus with complication, without long-term current use of insulin (HCC)   [place] [facility] [place] - A Dept Of [place]. [facility] [name], MD       Future Appointments             In 3 days [name], MD [facility] - A Dept Of Eligha Bridegroom. [facility]            Passed - Cr in normal range and within 360 days    Creat  Date Value Ref Range Status  11/29/2016 1.23 0.70 - 1.33 mg/dL Final    Comment:      For patients > or = 66 years of age: The upper reference limit for Creatinine is approximately 13% higher  for people identified as African-American.      Creatinine, Ser  Date Value Ref Range Status  02/22/2023 1.82 (H) 0.76 - 1.27 mg/dL Final   Creatinine, POC  Date Value Ref Range Status  06/05/2023 13.1 mg/dL Final    Comment:    ABSTRACTED BY HIM   Creatinine, Urine  Date Value Ref Range Status  11/29/2016 CANCELED 20 - 370 mg/dL     Comment:    Test not performed, no urine was received.    Result canceled by the ancillary          Passed - eGFR in normal range and within 360 days    GFR, Est African American  Date Value Ref Range Status  11/29/2016 74 >=60 mL/min Final   GFR calc Af Amer  Date Value Ref Range Status  05/13/2020 74 >59 mL/min/1.73 Final    Comment:    **[facility] currently reports eGFR in compliance with the current**   recommendations of the [employer]. [facility] will   update reporting as new guidelines are published from the NKF-ASN   Task force.    GFR, Est  Non African American  Date Value Ref Range Status  11/29/2016 64 >=60 mL/min Final   GFR, Estimated  Date Value Ref Range Status  07/28/2022 46 (L) >60 mL/min Final    Comment:    (NOTE) Calculated using the CKD-EPI Creatinine Equation (2021)    eGFR  Date Value Ref Range Status  02/22/2023 40 (L) >59 mL/min/1.73 Final   EGFR  Date Value Ref Range Status  06/05/2023 53.0  Final    Comment:    ABSTRACTED BY HIM          rivaroxaban (XARELTO) 20 MG TABS tablet 90 tablet 0    Sig: Take 1 tablet (20 mg total) by mouth once daily with supper. (Needs PCP office visit for more refills).     Hematology: Anticoagulants - rivaroxaban Failed - 08/25/2023  1:44 PM      Failed - HCT in normal range and within 360 days    HCT  Date Value Ref Range Status  07/15/2022 41.8 39.0 - 52.0 % Final   Hematocrit  Date Value Ref Range Status  09/29/2021 41.5 37.5 - 51.0 % Final         Failed - HGB in normal range and within 360 days    Hemoglobin  Date Value Ref Range  Status  07/15/2022 14.2 13.0 - 17.0 g/dL Final  16/06/9603 54.0 13.0 - 17.7 g/dL Final         Failed - PLT in normal range and within 360 days    Platelets  Date Value Ref Range Status  07/15/2022 230 150 - 400 K/uL Final  09/29/2021 274 150 - 450 x10E3/uL Final         Passed - ALT in normal range and within 360 days    ALT  Date Value Ref Range Status  02/22/2023 12 0 - 44 IU/L Final         Passed - AST in normal range and within 360 days    AST  Date Value Ref Range Status  02/22/2023 11 0 - 40 IU/L Final         Passed - Cr in normal range and within 360 days    Creat  Date Value Ref Range Status  11/29/2016 1.23 0.70 - 1.33 mg/dL Final    Comment:      For patients > or = 66 years of age: The upper reference limit for Creatinine is approximately 13% higher for people identified as African-American.      Creatinine, Ser  Date Value Ref Range Status  02/22/2023 1.82 (H) 0.76 - 1.27 mg/dL Final   Creatinine, POC  Date Value Ref Range Status  06/05/2023 13.1 mg/dL Final    Comment:    ABSTRACTED BY HIM   Creatinine, Urine  Date Value Ref Range Status  11/29/2016 CANCELED 20 - 370 mg/dL     Comment:    Test not performed, no urine was received.    Result canceled by the ancillary          Passed - eGFR is 15 or above and within 360 days    GFR, Est African American  Date Value Ref Range Status  11/29/2016 74 >=60 mL/min Final   GFR calc Af Amer  Date Value Ref Range Status  05/13/2020 74 >59 mL/min/1.73 Final    Comment:    **[facility] currently reports eGFR in compliance with the current**   recommendations of the [employer]. [facility] will   update reporting as new guidelines  are published from the NKF-ASN   Task force.    GFR, Est Non African American  Date Value Ref Range Status  11/29/2016 64 >=60 mL/min Final   GFR, Estimated  Date Value Ref Range Status  07/28/2022 46 (L) >60 mL/min Final    Comment:     (NOTE) Calculated using the CKD-EPI Creatinine Equation (2021)    eGFR  Date Value Ref Range Status  02/22/2023 40 (L) >59 mL/min/1.73 Final   EGFR  Date Value Ref Range Status  06/05/2023 53.0  Final    Comment:    ABSTRACTED BY HIM         Passed - Patient is not pregnant      Passed - Valid encounter within last 12 months    Recent Outpatient Visits           6 months ago Type 2 diabetes mellitus with complication, without long-term current use of insulin (HCC)   [place] [facility] Dept Of [place]. [facility] [name], MD   10 months ago Encounter for [employer] annual wellness exam   [place] [facility] [place] - A Dept Of [place]. [facility] [name], MD   1 year ago Type 2 diabetes mellitus with complication, without long-term current use of insulin (HCC)   [place] [facility] [place] - A Dept Of [place]. [facility] [name], MD   1 year ago Type 2 diabetes mellitus with complication, without long-term current use of insulin (HCC)   [place] [facility] [place] - A Dept Of [place]. [facility] [name], MD   1 year ago Type 2 diabetes mellitus with complication, without long-term current use of insulin (HCC)   [place] [facility] [place] - A Dept Of [place]. [facility] [name], MD       Future Appointments             In 3 days [name], MD [facility] [place] - A Dept Of Eligha Bridegroom. [facility]             glipiZIDE (GLUCOTROL) 5 MG tablet 90 tablet 0    Sig: Take 0.5 tablets (2.5 mg total) by mouth 2 (two) times daily before a meal.     Endocrinology:  Diabetes - Sulfonylureas Failed - 08/25/2023  1:44 PM      Failed - HBA1C is between 0 and 7.9 and within 180 days    HbA1c, POC (controlled diabetic range)  Date Value Ref Range Status  02/22/2023 6.6 0.0 - 7.0 % Final         Failed - Valid encounter  within last 6 months    Recent Outpatient Visits           6 months ago Type 2 diabetes mellitus with complication, without long-term current use of insulin (HCC)   [place] [facility] [place] - A Dept Of [place]. [facility] [name], MD   10 months ago Encounter for [employer] annual wellness exam    [facility] [place] - A Dept Of [place]. [facility] [name], MD   1 year ago Type 2 diabetes mellitus with complication, without long-term current use of insulin (HCC)    [facility] [place] - A Dept Of [place]. [facility] [name], MD   1 year ago Type 2 diabetes mellitus with  complication, without long-term current use of insulin (HCC)   [place] [facility] [place] - A Dept Of [place]. [facility] [name], MD   1 year ago Type 2 diabetes mellitus with complication, without long-term current use of insulin (HCC)   [place] [facility] [place] - A Dept Of [place]. [facility] [name], MD       Future Appointments             In 3 days [name], MD [facility] - A Dept Of Eligha Bridegroom. [facility]            Passed - Cr in normal range and within 360 days    Creat  Date Value Ref Range Status  11/29/2016 1.23 0.70 - 1.33 mg/dL Final    Comment:      For patients > or = 66 years of age: The upper reference limit for Creatinine is approximately 13% higher for people identified as African-American.      Creatinine, Ser  Date Value Ref Range Status  02/22/2023 1.82 (H) 0.76 - 1.27 mg/dL Final   Creatinine, POC  Date Value Ref Range Status  06/05/2023 13.1 mg/dL Final    Comment:    ABSTRACTED BY HIM   Creatinine, Urine  Date Value Ref Range Status  11/29/2016 CANCELED 20 - 370 mg/dL     Comment:    Test not performed, no urine was received.    Result canceled by the ancillary           atorvastatin (LIPITOR)  20 MG tablet 90 tablet 0    Sig: Take 1 tablet (20 mg total) by mouth daily.     Cardiovascular:  Antilipid - Statins Failed - 08/25/2023  1:44 PM      Failed - Lipid Panel in normal range within the last 12 months    Cholesterol, Total  Date Value Ref Range Status  06/29/2021 103 100 - 199 mg/dL Final   Cholesterol  Date Value Ref Range Status  07/15/2022 93 0 - 200 mg/dL Final   LDL Chol Calc (NIH)  Date Value Ref Range Status  06/29/2021 42 0 - 99 mg/dL Final   LDL Cholesterol  Date Value Ref Range Status  07/15/2022 44 0 - 99 mg/dL Final    Comment:           Total Cholesterol/HDL:CHD Risk Coronary Heart Disease Risk Table                     Men   Women  1/2 Average Risk   3.4   3.3  Average Risk       5.0   4.4  2 X Average Risk   9.6   7.1  3 X Average Risk  23.4   11.0        Use the calculated Patient Ratio above and the CHD Risk Table to determine the patient's CHD Risk.        ATP III CLASSIFICATION (LDL):  <100     mg/dL   Optimal  027-253  mg/dL   Near or Above                    Optimal  130-159  mg/dL   Borderline  664-403  mg/dL   High  >474     mg/dL   Very High Performed at [facility], [address],  [place], [place] [address]    HDL  Date Value Ref Range Status  07/15/2022 39 (L) >40 mg/dL Final  60/45/4098 46 >11 mg/dL Final   Triglycerides  Date Value Ref Range Status  07/15/2022 50 <150 mg/dL Final         Passed - Patient is not pregnant      Passed - Valid encounter within last 12 months    Recent Outpatient Visits           6 months ago Type 2 diabetes mellitus with complication, without long-term current use of insulin (HCC)   [place] [facility] - A Dept Of [place]. [facility] [name], MD   10 months ago Encounter for [employer] annual wellness exam   [place] [facility] [place] - A Dept Of [place]. [facility] [name], MD   1 year ago Type 2 diabetes mellitus  with complication, without long-term current use of insulin (HCC)   [place] [facility] [place] - A Dept Of [place]. [facility] [name], MD   1 year ago Type 2 diabetes mellitus with complication, without long-term current use of insulin (HCC)   [place] [facility] [place] - A Dept Of [place]. [facility] [name], MD   1 year ago Type 2 diabetes mellitus with complication, without long-term current use of insulin (HCC)   [place] [facility] [place] - A Dept Of [place]. [facility] [name], MD       Future Appointments             In 3 days [name], MD [facility] [place] - A Dept Of Eligha Bridegroom. [facility]             gabapentin (NEURONTIN) 300 MG capsule 180 capsule 0    Sig: Take 1 capsule (300 mg total) by mouth 2 (two) times daily.     Neurology: Anticonvulsants - gabapentin Passed - 08/25/2023  1:44 PM      Passed - Cr in normal range and within 360 days    Creat  Date Value Ref Range Status  11/29/2016 1.23 0.70 - 1.33 mg/dL Final    Comment:      For patients > or = 66 years of age: The upper reference limit for Creatinine is approximately 13% higher for people identified as African-American.      Creatinine, Ser  Date Value Ref Range Status  02/22/2023 1.82 (H) 0.76 - 1.27 mg/dL Final   Creatinine, POC  Date Value Ref Range Status  06/05/2023 13.1 mg/dL Final    Comment:    ABSTRACTED BY HIM   Creatinine, Urine  Date Value Ref Range Status  11/29/2016 CANCELED 20 - 370 mg/dL     Comment:    Test not performed, no urine was received.    Result canceled by the ancillary          Passed - Completed PHQ-2 or PHQ-9 in the last 360 days      Passed - Valid encounter within last 12 months    Recent Outpatient Visits           6 months ago Type 2 diabetes mellitus with complication, without long-term current use of insulin (HCC)   [place] [facility]  [place] - A Dept Of . [facility] [name], MD   10 months ago Encounter for [employer] annual wellness exam  [place] [facility] [place] - A Dept Of [place]. [facility] [name], MD   1 year ago Type 2 diabetes mellitus with complication, without long-term current use of insulin (HCC)   [place] [facility] [place] - A Dept Of [place]. [facility] [name], MD   1 year ago Type 2 diabetes mellitus with complication, without long-term current use of insulin (HCC)   [place] [facility] [place] - A Dept Of [place]. [facility] [name], MD   1 year ago Type 2 diabetes mellitus with complication, without long-term current use of insulin (HCC)    [facility] [place] - A Dept Of [place]. [facility] [name], MD       Future Appointments             In 3 days [name], MD [facility] [place] - A Dept Of Eligha Bridegroom. [facility]

## 2023-08-28 ENCOUNTER — Other Ambulatory Visit: Payer: Self-pay

## 2023-08-28 ENCOUNTER — Encounter: Payer: Self-pay | Admitting: Family Medicine

## 2023-08-28 ENCOUNTER — Ambulatory Visit: Payer: Medicare Other | Attending: Family Medicine | Admitting: Family Medicine

## 2023-08-28 VITALS — BP 128/76 | HR 60 | Wt 255.2 lb

## 2023-08-28 DIAGNOSIS — I480 Paroxysmal atrial fibrillation: Secondary | ICD-10-CM | POA: Diagnosis not present

## 2023-08-28 DIAGNOSIS — E1142 Type 2 diabetes mellitus with diabetic polyneuropathy: Secondary | ICD-10-CM | POA: Diagnosis not present

## 2023-08-28 DIAGNOSIS — I110 Hypertensive heart disease with heart failure: Secondary | ICD-10-CM | POA: Diagnosis not present

## 2023-08-28 DIAGNOSIS — S98132D Complete traumatic amputation of one left lesser toe, subsequent encounter: Secondary | ICD-10-CM

## 2023-08-28 DIAGNOSIS — L84 Corns and callosities: Secondary | ICD-10-CM | POA: Insufficient documentation

## 2023-08-28 DIAGNOSIS — K7460 Unspecified cirrhosis of liver: Secondary | ICD-10-CM | POA: Diagnosis not present

## 2023-08-28 DIAGNOSIS — I272 Pulmonary hypertension, unspecified: Secondary | ICD-10-CM | POA: Insufficient documentation

## 2023-08-28 DIAGNOSIS — M2560 Stiffness of unspecified joint, not elsewhere classified: Secondary | ICD-10-CM | POA: Diagnosis not present

## 2023-08-28 DIAGNOSIS — Z7182 Exercise counseling: Secondary | ICD-10-CM | POA: Insufficient documentation

## 2023-08-28 DIAGNOSIS — I152 Hypertension secondary to endocrine disorders: Secondary | ICD-10-CM

## 2023-08-28 DIAGNOSIS — Z713 Dietary counseling and surveillance: Secondary | ICD-10-CM | POA: Insufficient documentation

## 2023-08-28 DIAGNOSIS — E118 Type 2 diabetes mellitus with unspecified complications: Secondary | ICD-10-CM

## 2023-08-28 DIAGNOSIS — Z79899 Other long term (current) drug therapy: Secondary | ICD-10-CM | POA: Diagnosis not present

## 2023-08-28 DIAGNOSIS — Z7952 Long term (current) use of systemic steroids: Secondary | ICD-10-CM | POA: Diagnosis not present

## 2023-08-28 DIAGNOSIS — I739 Peripheral vascular disease, unspecified: Secondary | ICD-10-CM | POA: Insufficient documentation

## 2023-08-28 DIAGNOSIS — Z6841 Body Mass Index (BMI) 40.0 and over, adult: Secondary | ICD-10-CM

## 2023-08-28 DIAGNOSIS — Z7984 Long term (current) use of oral hypoglycemic drugs: Secondary | ICD-10-CM

## 2023-08-28 DIAGNOSIS — N1831 Chronic kidney disease, stage 3a: Secondary | ICD-10-CM

## 2023-08-28 DIAGNOSIS — E66813 Obesity, class 3: Secondary | ICD-10-CM

## 2023-08-28 DIAGNOSIS — E119 Type 2 diabetes mellitus without complications: Secondary | ICD-10-CM | POA: Insufficient documentation

## 2023-08-28 DIAGNOSIS — E1122 Type 2 diabetes mellitus with diabetic chronic kidney disease: Secondary | ICD-10-CM | POA: Diagnosis not present

## 2023-08-28 DIAGNOSIS — M25531 Pain in right wrist: Secondary | ICD-10-CM | POA: Diagnosis present

## 2023-08-28 DIAGNOSIS — Z7901 Long term (current) use of anticoagulants: Secondary | ICD-10-CM | POA: Insufficient documentation

## 2023-08-28 DIAGNOSIS — I509 Heart failure, unspecified: Secondary | ICD-10-CM | POA: Diagnosis not present

## 2023-08-28 LAB — POCT GLYCOSYLATED HEMOGLOBIN (HGB A1C): HbA1c, POC (controlled diabetic range): 6.5 % (ref 0.0–7.0)

## 2023-08-28 LAB — GLUCOSE, POCT (MANUAL RESULT ENTRY): POC Glucose: 128 mg/dL — AB (ref 70–99)

## 2023-08-28 MED ORDER — RIVAROXABAN 20 MG PO TABS: 20.0000 mg | ORAL_TABLET | Freq: Every day | ORAL | 1 refills | Status: DC

## 2023-08-28 MED ORDER — TORSEMIDE 20 MG PO TABS
ORAL_TABLET | ORAL | 0 refills | Status: DC
  Filled 2023-08-28: qty 90, 30d supply, fill #0

## 2023-08-28 MED ORDER — TORSEMIDE 20 MG PO TABS
ORAL_TABLET | ORAL | 1 refills | Status: DC
  Filled 2023-10-04: qty 90, 30d supply, fill #0

## 2023-08-28 MED ORDER — SPIRONOLACTONE 25 MG PO TABS
50.0000 mg | ORAL_TABLET | Freq: Every day | ORAL | 1 refills | Status: DC
  Filled 2023-08-28 – 2023-10-04 (×2): qty 180, 90d supply, fill #0

## 2023-08-28 MED ORDER — GABAPENTIN 300 MG PO CAPS
300.0000 mg | ORAL_CAPSULE | Freq: Two times a day (BID) | ORAL | 1 refills | Status: DC
  Filled 2023-11-27: qty 180, 90d supply, fill #0
  Filled 2024-02-23: qty 180, 90d supply, fill #1

## 2023-08-28 MED ORDER — GLIPIZIDE 5 MG PO TABS
2.5000 mg | ORAL_TABLET | Freq: Two times a day (BID) | ORAL | 1 refills | Status: DC
  Filled 2023-11-27: qty 45, 45d supply, fill #0
  Filled 2024-02-23: qty 45, 45d supply, fill #1

## 2023-08-28 MED ORDER — PREDNISONE 20 MG PO TABS
20.0000 mg | ORAL_TABLET | Freq: Every day | ORAL | 0 refills | Status: DC
  Filled 2023-08-28: qty 5, 5d supply, fill #0

## 2023-08-28 MED ORDER — DICLOFENAC SODIUM 1 % EX GEL
4.0000 g | Freq: Four times a day (QID) | CUTANEOUS | 1 refills | Status: DC
  Filled 2023-08-28: qty 100, 14d supply, fill #0
  Filled 2024-02-23: qty 100, 14d supply, fill #1

## 2023-08-28 MED ORDER — DAPAGLIFLOZIN PROPANEDIOL 10 MG PO TABS: 10.0000 mg | ORAL_TABLET | Freq: Every day | ORAL | 1 refills | Status: DC

## 2023-08-28 MED ORDER — ATORVASTATIN CALCIUM 20 MG PO TABS: 20.0000 mg | ORAL_TABLET | Freq: Every day | ORAL | 1 refills | Status: DC

## 2023-08-28 MED ORDER — AMLODIPINE BESYLATE 10 MG PO TABS: 10.0000 mg | ORAL_TABLET | Freq: Every day | ORAL | 1 refills | Status: DC

## 2023-08-28 NOTE — Patient Instructions (Signed)
VISIT SUMMARY:  During today's visit, we discussed your ongoing right wrist pain, which has been gradually improving but still causes discomfort, especially when bending the wrist. We also reviewed your arthritis, hypertension, diabetes, peripheral arterial disease, and cirrhosis. Your diabetes and blood pressure are well controlled, and there are no new symptoms related to your peripheral arterial disease or cirrhosis. We have made some adjustments to your treatment plan to help manage your symptoms more effectively.  YOUR PLAN:  -RIGHT WRIST PAIN: Your wrist pain is likely due to arthritis, which is inflammation of the joints. We have prescribed Prednisone for short-term relief and Voltaren gel for topical application to help manage the pain.  -ARTHRITIS: Arthritis is a condition that causes joint stiffness and pain. We recommend continuing the use of Prednisone and Voltaren gel as needed to help alleviate your symptoms.  -HYPERTENSION: Hypertension, or high blood pressure, is well controlled with your current medication, Amlodipine. Please continue taking Amlodipine as prescribed.  -TYPE 2 DIABETES MELLITUS: Your diabetes is well controlled with an A1c of 6.15. Continue taking Farxiga and Glipizide as prescribed to manage your blood sugar levels.  -PERIPHERAL ARTERIAL DISEASE: Peripheral arterial disease is a condition where the blood vessels outside your heart and brain narrow, reducing blood flow. You have decreased sensation in your feet, but no new symptoms. We will continue with your current management and refer you to a podiatrist for foot care.  -CIRRHOSIS: Cirrhosis is a late stage of scarring (fibrosis) of the liver. You have no new symptoms, so we will continue with your current management plan.  INSTRUCTIONS:  Please follow up in 6 months. If your wrist pain does not improve, we may consider an X-ray. Additionally, please schedule an appointment with a podiatrist for foot care.

## 2023-08-28 NOTE — Progress Notes (Signed)
Subjective:  Patient ID: Paul Mclaughlin, male    DOB: 1956-06-02  Age: 67 y.o. MRN: [id number]  CC: Medical Management of Chronic Issues (Patient stated that he has stiffness in his hands and rt wrist pain )   HPI Paul Mclaughlin is a 67 y.o. year old male with a history of type 2 diabetes mellitus (A1c 6.6), hypertension, atrial fibrillation (on anticoagulation with Xarelto and rate control with metoprolol), history of Congenital heart disease status post surgery, Pulmonary hypertension, congestive heart failure ( EF of 65-70% from 2-D echo 07/2022, mild LVH,  RV function mildly reduced, mildly elevated PA systolic pressure, moderately dilated LA, IVC dilatation), peripheral arterial disease , liver cirrhosis.   Interval History: Discussed the use of AI scribe software for clinical note transcription with the patient, who gave verbal consent to proceed.  The patient presents with right wrist pain that has been ongoing for months but is gradually improving. The pain is exacerbated by bending the wrist, particularly when lifting and pouring a gallon jug. He also reports morning joint stiffness, which he attributes to arthritis. He has previously taken prednisone for knee pain, which he found effective. He has not used anti-inflammatory medications such as Aleve or ibuprofen due to his concurrent use of Xarelto.  The patient also reports occasional leg pain after standing for extended periods, and occasional knee pain. He has a history of peripheral arterial disease. He also has a history of cirrhosis, but denies any current symptoms such as abdominal swelling or blood in the stool.  The patient has been managing his diabetes with [place] and glipizide, and his blood pressure with amlodipine. He reports good adherence to his medication regimen. His most recent A1C was 6.5, which he was hoping would be lower given his dietary changes. He denies any episodes of hypoglycemia.  The patient also has a  small kidney stone, which is not currently causing any symptoms. He has not seen his cardiologist recently, but his last echocardiogram in November of the previous year showed EF of 65 to 70% with LVH.      Past Medical History:  Diagnosis Date   Atrial fibrillation (HCC)    Cellulitis and abscess of left leg 06/2016   CHF (congestive heart failure) (HCC)    Diabetes (HCC)    Dyspnea     Past Surgical History:  Procedure Laterality Date   AMPUTATION Left 06/24/2016   Procedure: FOOT FIFTH RAY TOE AMPUTATION;  Surgeon: [name], MD;  Location: [facility] OR;  Service: Orthopedics;  Laterality: Left;   IR PARACENTESIS  11/12/2020   IR PARACENTESIS  11/13/2020   IR PARACENTESIS  11/16/2020   open heart surgery     As a child.  [age] years old.  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    RIGHT HEART CATH N/A 02/17/2021   Procedure: RIGHT HEART CATH;  Surgeon: [name], MD;  Location[facility] INVASIVE CV LAB;  Service: Cardiovascular;  Laterality: N/A;   RIGHT/LEFT HEART CATH AND CORONARY ANGIOGRAPHY N/A 03/30/2017   Procedure: Right/Left Heart Cath and Coronary Angiography;  Surgeon: [name], MD;  Location: [facility] INVASIVE CV LAB;  Service: Cardiovascular;  Laterality: N/A;    Family History  Problem Relation Age of Onset   Diabetes Mellitus II Mother        ESRD   Diabetes Mellitus II Brother    Emphysema Father     Social History   Socioeconomic History   Marital status: Divorced    Spouse name:  Not on file   Number of children: 1   Years of education: Not on file   Highest education level: Not on file  Occupational History   Not on file  Tobacco Use   Smoking status: Never   Smokeless tobacco: Never  Vaping Use   Vaping status: Never Used  Substance and Sexual Activity   Alcohol use: No    Comment: gave up drinking ~20 years ago   Drug use: Yes    Types: Marijuana    Comment: daily   Sexual activity: Not on file  Other Topics Concern   Not on file  Social History Narrative   Lives alone     Social Drivers of Health   Financial Resource Strain: Low Risk  (10/12/2022)   Overall Financial Resource Strain (CARDIA)    Difficulty of Paying Living Expenses: Not hard at all  Food Insecurity: No Food Insecurity (10/12/2022)   Hunger Vital Sign    Worried About Running Out of Food in the Last Year: Never true    Ran Out of Food in the Last Year: Never true  Transportation Needs: No Transportation Needs (10/12/2022)   PRAPARE - Administrator, Civil Service (Medical): No    Lack of Transportation (Non-Medical): No  Physical Activity: Insufficiently Active (10/12/2022)   Exercise Vital Sign    Days of Exercise per Week: 2 days    Minutes of Exercise per Session: 20 min  Stress: No Stress Concern Present (10/12/2022)   [employer] of Occupational Health - Occupational Stress Questionnaire    Feeling of Stress : Not at all  Social Connections: Socially Isolated (10/12/2022)   Social Connection and Isolation Panel [NHANES]    Frequency of Communication with Friends and Family: More than three times a week    Frequency of Social Gatherings with Friends and Family: Three times a week    Attends Religious Services: Never    Active Member of Clubs or Organizations: No    Attends Banker Meetings: Never    Marital Status: Divorced    Allergies  Allergen Reactions   Tadalafil Other (See Comments)    Patient experiences a head ache with this medication.    Outpatient Medications Prior to Visit  Medication Sig Dispense Refill   albuterol (PROVENTIL HFA;VENTOLIN HFA) 108 (90 Base) MCG/ACT inhaler Inhale 2 puffs into the lungs every 6 (six) hours as needed for wheezing or shortness of breath. 1 Inhaler 0   Misc. Devices MISC Blood pressure monitor.  Diagnosis- hypertension 1 each 0   amLODipine (NORVASC) 10 MG tablet Take 1 tablet (10 mg total) by mouth daily. 90 tablet 1   atorvastatin (LIPITOR) 20 MG tablet Take 1 tablet (20 mg total) by mouth daily. 90  tablet 0   dapagliflozin propanediol (FARXIGA) 10 MG TABS tablet Take 1 tablet (10 mg total) by mouth daily before breakfast. 90 tablet 0   gabapentin (NEURONTIN) 300 MG capsule Take 1 capsule (300 mg total) by mouth 2 (two) times daily. 180 capsule 0   glipiZIDE (GLUCOTROL) 5 MG tablet Take 0.5 tablets (2.5 mg total) by mouth 2 (two) times daily before a meal. 90 tablet 0   predniSONE (DELTASONE) 10 MG tablet Take 1 tablet (10 mg total) by mouth daily with breakfast. 30 tablet 0   predniSONE (DELTASONE) 20 MG tablet Take 1 tablet (20 mg total) by mouth daily with breakfast. 5 tablet 0   rivaroxaban (XARELTO) 20 MG TABS tablet Take 1 tablet (20  mg total) by mouth once daily with supper. (Needs PCP office visit for more refills). 90 tablet 0   spironolactone (ALDACTONE) 25 MG tablet Take 2 tablets (50 mg total) by mouth daily. NEEDS FOLLOW UP APPOINTMENT FOR MORE REFILLS 180 tablet 0   torsemide (DEMADEX) 20 MG tablet Take 2 tablets (40 mg total) by mouth every morning AND 1 tablet (20 mg total) every evening. 90 tablet 0   No facility-administered medications prior to visit.     ROS Review of Systems  Constitutional:  Negative for activity change and appetite change.  HENT:  Negative for sinus pressure and sore throat.   Respiratory:  Negative for chest tightness, shortness of breath and wheezing.   Cardiovascular:  Negative for chest pain and palpitations.  Gastrointestinal:  Negative for abdominal distention, abdominal pain and constipation.  Genitourinary: Negative.   Musculoskeletal:        See HPI  Psychiatric/Behavioral:  Negative for behavioral problems and dysphoric mood.     Objective:  BP 128/76 (BP Location: Left Arm, Patient Position: Sitting, Cuff Size: Normal)   Pulse 60   Wt 255 lb 3.2 oz (115.8 kg)   SpO2 98%   BMI 33.67 kg/m      08/28/2023    2:12 PM 02/22/2023    2:25 PM 10/12/2022    3:39 PM  BP/Weight  Systolic BP 128 125 131  Diastolic BP 76 64 72  Wt.  (Lbs) 255.2 251.2 243.8  BMI 33.67 kg/m2 33.14 kg/m2 32.17 kg/m2      Physical Exam Constitutional:      Appearance: He is well-developed.  Cardiovascular:     Rate and Rhythm: Normal rate.     Heart sounds: Normal heart sounds. No murmur heard. Pulmonary:     Effort: Pulmonary effort is normal.     Breath sounds: Normal breath sounds. No wheezing or rales.  Chest:     Chest wall: No tenderness.  Abdominal:     General: Bowel sounds are normal. There is no distension.     Palpations: Abdomen is soft. There is no mass.     Tenderness: There is no abdominal tenderness.  Musculoskeletal:        General: Normal range of motion.     Right lower leg: No edema.     Left lower leg: No edema.     Comments: No tenderness on palpation of right wrist or on pronation or supination Normal handgrip bilaterally  Neurological:     Mental Status: He is alert and oriented to person, place, and time.  Psychiatric:        Mood and Affect: Mood normal.        Latest Ref Rng & Units 02/22/2023    3:33 PM 07/28/2022    1:43 PM 07/15/2022    1:58 PM  CMP  Glucose 70 - 99 mg/dL 161  096  045   BUN 8 - 27 mg/dL 34  38  31   Creatinine 0.76 - 1.27 mg/dL 4.09  8.11  9.14   Sodium 134 - 144 mmol/L 139  139  139   Potassium 3.5 - 5.2 mmol/L 4.3  4.1  4.0   Chloride 96 - 106 mmol/L 99  101  104   CO2 20 - 29 mmol/L 23  25  25    Calcium 8.6 - 10.2 mg/dL 9.1  9.5  9.3   Total Protein 6.0 - 8.5 g/dL 7.9   7.8   Total Bilirubin 0.0 - 1.2 mg/dL  0.7   1.2   Alkaline Phos 44 - 121 IU/L 143   112   AST 0 - 40 IU/L 11   17   ALT 0 - 44 IU/L 12   21     Lipid Panel     Component Value Date/Time   CHOL 93 07/15/2022 1358   CHOL 103 06/29/2021 1620   TRIG 50 07/15/2022 1358   HDL 39 (L) 07/15/2022 1358   HDL 46 06/29/2021 1620   CHOLHDL 2.4 07/15/2022 1358   VLDL 10 07/15/2022 1358   LDLCALC 44 07/15/2022 1358   LDLCALC 42 06/29/2021 1620    CBC    Component Value Date/Time   WBC 7.9  07/15/2022 1358   RBC 4.32 07/15/2022 1358   HGB 14.2 07/15/2022 1358   HGB 14.1 09/29/2021 1453   HCT 41.8 07/15/2022 1358   HCT 41.5 09/29/2021 1453   PLT 230 07/15/2022 1358   PLT 274 09/29/2021 1453   MCV 96.8 07/15/2022 1358   MCV 95 09/29/2021 1453   MCH 32.9 07/15/2022 1358   MCHC 34.0 07/15/2022 1358   RDW 13.5 07/15/2022 1358   RDW 12.3 09/29/2021 1453   LYMPHSABS 1.2 09/29/2021 1453   MONOABS 0.4 11/18/2020 0120   EOSABS 0.4 09/29/2021 1453   BASOSABS 0.1 09/29/2021 1453    Lab Results  Component Value Date   HGBA1C 6.5 08/28/2023    Assessment & Plan:      Right Wrist Pain Likely arthritis with pain on wrist flexion. Improvement noted over months. -Prescribe Prednisone for short-term relief. -Prescribe Voltaren gel for topical application.  Arthritis Generalized joint stiffness, likely arthritis. -Continue Prednisone and Voltaren gel as needed.  Hypertension Well controlled on Amlodipine. -Continue Amlodipine.  Type 2 Diabetes Mellitus Well controlled with A1c of 6.5. On Farxiga and Glipizide. -Continue Farxiga and Glipizide. -He does need a podiatry appointment for his right foot callus but would like to do that on his own -Counseled on Diabetic diet, my plate method, 161 minutes of moderate intensity exercise/week Blood sugar logs with fasting goals of 80-120 mg/dl, random of less than 096 and in the event of sugars less than 60 mg/dl or greater than 045 mg/dl encouraged to notify the clinic. Advised on the need for annual eye exams, annual foot exams, Pneumonia vaccine.   Peripheral Arterial Disease No new symptoms. Decreased sensation in feet noted on examination. -Continue current management.   Cirrhosis No new symptoms. -Continue current management.  Left fifth toe amputation Will benefit from seeing podiatry but he would like to perform self footcare.  Paroxysmal A-fib -Stable on Xarelto   Follow-up in 6 months. If wrist pain does  not improve, consider X-ray.          Meds ordered this encounter  Medications   predniSONE (DELTASONE) 20 MG tablet    Sig: Take 1 tablet (20 mg total) by mouth daily with breakfast.    Dispense:  5 tablet    Refill:  0   diclofenac Sodium (VOLTAREN) 1 % GEL    Sig: Apply 4 g topically 4 (four) times daily.    Dispense:  100 g    Refill:  1   amLODipine (NORVASC) 10 MG tablet    Sig: Take 1 tablet (10 mg total) by mouth daily.    Dispense:  90 tablet    Refill:  1   atorvastatin (LIPITOR) 20 MG tablet    Sig: Take 1 tablet (20 mg total) by mouth daily.  Dispense:  90 tablet    Refill:  1   dapagliflozin propanediol (FARXIGA) 10 MG TABS tablet    Sig: Take 1 tablet (10 mg total) by mouth daily before breakfast.    Dispense:  90 tablet    Refill:  1   gabapentin (NEURONTIN) 300 MG capsule    Sig: Take 1 capsule (300 mg total) by mouth 2 (two) times daily.    Dispense:  180 capsule    Refill:  1   glipiZIDE (GLUCOTROL) 5 MG tablet    Sig: Take 0.5 tablets (2.5 mg total) by mouth 2 (two) times daily before a meal.    Dispense:  45 tablet    Refill:  1   rivaroxaban (XARELTO) 20 MG TABS tablet    Sig: Take 1 tablet (20 mg total) by mouth once daily with supper. (Needs PCP office visit for more refills).    Dispense:  90 tablet    Refill:  1   spironolactone (ALDACTONE) 25 MG tablet    Sig: Take 2 tablets (50 mg total) by mouth daily.    Dispense:  180 tablet    Refill:  1   torsemide (DEMADEX) 20 MG tablet    Sig: Take 2 tablets (40 mg total) by mouth every morning AND 1 tablet (20 mg total) every evening.    Dispense:  90 tablet    Refill:  1    Follow-up: Return in about 6 months (around 02/26/2024) for Chronic medical conditions.       [name], MD, FAAFP. [facility] [place], [place] [phone]   08/28/2023, 5:14 PM

## 2023-10-04 ENCOUNTER — Other Ambulatory Visit: Payer: Self-pay

## 2023-10-05 ENCOUNTER — Other Ambulatory Visit (HOSPITAL_COMMUNITY): Payer: Self-pay | Admitting: Cardiology

## 2023-10-05 ENCOUNTER — Other Ambulatory Visit: Payer: Self-pay

## 2023-10-05 MED ORDER — TORSEMIDE 20 MG PO TABS
ORAL_TABLET | ORAL | 0 refills | Status: DC
  Filled 2023-10-06: qty 90, 30d supply, fill #0

## 2023-10-05 MED ORDER — SPIRONOLACTONE 25 MG PO TABS
50.0000 mg | ORAL_TABLET | Freq: Every day | ORAL | 0 refills | Status: DC
  Filled 2023-10-06: qty 60, 30d supply, fill #0

## 2023-10-06 ENCOUNTER — Other Ambulatory Visit: Payer: Self-pay

## 2023-10-09 ENCOUNTER — Other Ambulatory Visit: Payer: Self-pay

## 2023-10-09 ENCOUNTER — Ambulatory Visit (HOSPITAL_COMMUNITY)
Admission: RE | Admit: 2023-10-09 | Discharge: 2023-10-09 | Disposition: A | Discharge status: Home / Self Care | Payer: Medicare Other | Source: Ambulatory Visit | Attending: Cardiology | Admitting: Cardiology

## 2023-10-09 ENCOUNTER — Encounter (HOSPITAL_COMMUNITY): Payer: Self-pay | Admitting: Cardiology

## 2023-10-09 VITALS — BP 138/70 | HR 56 | Wt 248.4 lb

## 2023-10-09 DIAGNOSIS — I4821 Permanent atrial fibrillation: Secondary | ICD-10-CM | POA: Insufficient documentation

## 2023-10-09 DIAGNOSIS — Z7901 Long term (current) use of anticoagulants: Secondary | ICD-10-CM | POA: Diagnosis not present

## 2023-10-09 DIAGNOSIS — K7460 Unspecified cirrhosis of liver: Secondary | ICD-10-CM | POA: Diagnosis not present

## 2023-10-09 DIAGNOSIS — N183 Chronic kidney disease, stage 3 unspecified: Secondary | ICD-10-CM | POA: Diagnosis not present

## 2023-10-09 DIAGNOSIS — E1159 Type 2 diabetes mellitus with other circulatory complications: Secondary | ICD-10-CM | POA: Diagnosis not present

## 2023-10-09 DIAGNOSIS — I272 Pulmonary hypertension, unspecified: Secondary | ICD-10-CM | POA: Insufficient documentation

## 2023-10-09 DIAGNOSIS — I2510 Atherosclerotic heart disease of native coronary artery without angina pectoris: Secondary | ICD-10-CM | POA: Insufficient documentation

## 2023-10-09 DIAGNOSIS — I5032 Chronic diastolic (congestive) heart failure: Secondary | ICD-10-CM

## 2023-10-09 DIAGNOSIS — Z79899 Other long term (current) drug therapy: Secondary | ICD-10-CM | POA: Insufficient documentation

## 2023-10-09 DIAGNOSIS — I130 Hypertensive heart and chronic kidney disease with heart failure and stage 1 through stage 4 chronic kidney disease, or unspecified chronic kidney disease: Secondary | ICD-10-CM | POA: Insufficient documentation

## 2023-10-09 DIAGNOSIS — E118 Type 2 diabetes mellitus with unspecified complications: Secondary | ICD-10-CM | POA: Diagnosis not present

## 2023-10-09 DIAGNOSIS — I5033 Acute on chronic diastolic (congestive) heart failure: Secondary | ICD-10-CM | POA: Diagnosis present

## 2023-10-09 DIAGNOSIS — E1122 Type 2 diabetes mellitus with diabetic chronic kidney disease: Secondary | ICD-10-CM | POA: Diagnosis not present

## 2023-10-09 DIAGNOSIS — I480 Paroxysmal atrial fibrillation: Secondary | ICD-10-CM

## 2023-10-09 DIAGNOSIS — I152 Hypertension secondary to endocrine disorders: Secondary | ICD-10-CM

## 2023-10-09 LAB — COMPREHENSIVE METABOLIC PANEL
ALT: 20 U/L (ref 0–44)
AST: 22 U/L (ref 15–41)
Albumin: 4.1 g/dL (ref 3.5–5.0)
Alkaline Phosphatase: 108 U/L (ref 38–126)
Anion gap: 13 (ref 5–15)
BUN: 35 mg/dL — ABNORMAL HIGH (ref 8–23)
CO2: 24 mmol/L (ref 22–32)
Calcium: 9.4 mg/dL (ref 8.9–10.3)
Chloride: 100 mmol/L (ref 98–111)
Creatinine, Ser: 1.57 mg/dL — ABNORMAL HIGH (ref 0.61–1.24)
GFR, Estimated: 48 mL/min — ABNORMAL LOW (ref >60)
Glucose, Bld: 109 mg/dL — ABNORMAL HIGH (ref 70–99)
Potassium: 4.1 mmol/L (ref 3.5–5.1)
Sodium: 137 mmol/L (ref 135–145)
Total Bilirubin: 1.2 mg/dL (ref 0.0–1.2)
Total Protein: 7.5 g/dL (ref 6.5–8.1)

## 2023-10-09 LAB — CBC
HCT: 38.3 % — ABNORMAL LOW (ref 39.0–52.0)
Hemoglobin: 13 g/dL (ref 13.0–17.0)
MCH: 32.2 pg (ref 26.0–34.0)
MCHC: 33.9 g/dL (ref 30.0–36.0)
MCV: 94.8 fL (ref 80.0–100.0)
Platelets: 246 10*3/uL (ref 150–400)
RBC: 4.04 MIL/uL — ABNORMAL LOW (ref 4.22–5.81)
RDW: 13.9 % (ref 11.5–15.5)
WBC: 7.7 10*3/uL (ref 4.0–10.5)
nRBC: 0 % (ref 0.0–0.2)

## 2023-10-09 LAB — BRAIN NATRIURETIC PEPTIDE: B Natriuretic Peptide: 68.7 pg/mL (ref 0.0–100.0)

## 2023-10-09 LAB — LIPID PANEL
Cholesterol: 96 mg/dL (ref 0–200)
HDL: 40 mg/dL — ABNORMAL LOW (ref >40)
LDL Cholesterol: 46 mg/dL (ref 0–99)
Total CHOL/HDL Ratio: 2.4 {ratio}
Triglycerides: 49 mg/dL (ref <150)
VLDL: 10 mg/dL (ref 0–40)

## 2023-10-09 MED ORDER — ATORVASTATIN CALCIUM 20 MG PO TABS
20.0000 mg | ORAL_TABLET | Freq: Every day | ORAL | 3 refills | Status: AC
  Filled 2023-10-09 – 2023-11-27 (×2): qty 90, 90d supply, fill #0
  Filled 2024-02-23: qty 90, 90d supply, fill #1
  Filled 2024-05-27: qty 90, 90d supply, fill #2

## 2023-10-09 MED ORDER — AMLODIPINE BESYLATE 10 MG PO TABS
10.0000 mg | ORAL_TABLET | Freq: Every day | ORAL | 3 refills | Status: AC
  Filled 2023-10-09 – 2023-11-27 (×2): qty 90, 90d supply, fill #0
  Filled 2024-02-23: qty 90, 90d supply, fill #1
  Filled 2024-05-27: qty 90, 90d supply, fill #2

## 2023-10-09 MED ORDER — SPIRONOLACTONE 25 MG PO TABS
50.0000 mg | ORAL_TABLET | Freq: Every day | ORAL | 3 refills | Status: AC
  Filled 2023-10-09 – 2023-10-30 (×2): qty 180, 90d supply, fill #0
  Filled 2024-02-02: qty 180, 90d supply, fill #1
  Filled 2024-04-30: qty 180, 90d supply, fill #2

## 2023-10-09 MED ORDER — TORSEMIDE 20 MG PO TABS
ORAL_TABLET | ORAL | 3 refills | Status: DC
  Filled 2023-10-09 – 2023-10-30 (×2): qty 200, 67d supply, fill #0
  Filled 2023-10-31: qty 270, 90d supply, fill #0

## 2023-10-09 MED ORDER — DAPAGLIFLOZIN PROPANEDIOL 10 MG PO TABS
10.0000 mg | ORAL_TABLET | Freq: Every day | ORAL | 3 refills | Status: AC
  Filled 2023-10-09 – 2023-11-27 (×2): qty 90, 90d supply, fill #0
  Filled 2024-02-23: qty 90, 90d supply, fill #1
  Filled 2024-05-27: qty 90, 90d supply, fill #2

## 2023-10-09 MED ORDER — RIVAROXABAN 20 MG PO TABS
20.0000 mg | ORAL_TABLET | Freq: Every day | ORAL | 3 refills | Status: AC
  Filled 2023-10-09 – 2023-11-27 (×2): qty 90, 90d supply, fill #0
  Filled 2024-02-23: qty 90, 90d supply, fill #1
  Filled 2024-05-27: qty 90, 90d supply, fill #2

## 2023-10-09 NOTE — Patient Instructions (Signed)
There has been no changes to your medications.  Labs done today, your results will be available in MyChart, we will contact you for abnormal readings.  Your physician has requested that you have an echocardiogram. Echocardiography is a painless test that uses sound waves to create images of your heart. It provides your doctor with information about the size and shape of your heart and how well your heart's chambers and valves are working. This procedure takes approximately one hour. There are no restrictions for this procedure. Please do NOT wear cologne, perfume, aftershave, or lotions (deodorant is allowed). Please arrive 15 minutes prior to your appointment time.  Please note: We ask at that you not bring children with you during ultrasound (echo/ vascular) testing. Due to room size and safety concerns, children are not allowed in the ultrasound rooms during exams. Our front office staff cannot provide observation of children in our lobby area while testing is being conducted. An adult accompanying a patient to their appointment will only be allowed in the ultrasound room at the discretion of the ultrasound technician under special circumstances. We apologize for any inconvenience.  Your physician recommends that you schedule a follow-up appointment in: 2 months.  If you have any questions or concerns before your next appointment please send [place] a message through [facility] or call our office at [phone].    TO LEAVE A MESSAGE FOR THE NURSE SELECT OPTION 2, PLEASE LEAVE A MESSAGE INCLUDING: YOUR NAME DATE OF BIRTH CALL BACK NUMBER REASON FOR CALL**this is important as we prioritize the call backs  YOU WILL RECEIVE A CALL BACK THE SAME DAY AS LONG AS YOU CALL BEFORE 4:00 PM  At the [facility], you and your health needs are our priority. As part of our continuing mission to provide you with exceptional heart care, we have created designated Provider Care Teams. These Care  Teams include your primary Cardiologist (physician) and Advanced Practice Providers (APPs- Physician Assistants and Nurse Practitioners) who all work together to provide you with the care you need, when you need it.   You may see any of the following providers on your designated Care Team at your next follow up: Dr Arvilla Meres Dr Marca Ancona Dr. Dorthula Nettles Dr. Clearnce Hasten Amy Filbert Schilder, NP Robbie Lis, [place] [facility] [place], [place] Brynda Peon, NP [place] Lee, NP Karle Plumber, PharmD   Please be sure to bring in all your medications bottles to every appointment.    Thank you for choosing [facility]

## 2023-10-09 NOTE — Progress Notes (Signed)
Advanced Heart Failure Clinic Note   PCP: [name], MD HF Cardiology: Dr. Shirlee Latch   HPI: [name] is a 69 y.o. with a past medical history of chronic diastolic CHF, atrial fibrillation, DM. He was seen in Dr. Jenene Slicker office on 03/16/17 and set up for a right and left heart cath with concerns for type I pulmonary HTN.   He was admitted in 7/18 after right and left heart cath with elevated filling pressures and severe pulmonary HTN. Results below, predominantly pulmonary venous HTN with PVR 3.1 WU. Also with 75% stenosis in the distal LAD. He was started on IV diuresis and diuresed 34 pounds total. Transitioned to po torsemide 60 mg daily (had previously been on Lasix) at home. During his hospitalization he was noted to have transient, asymptomatic episodes of bradycardia. His metoprolol was reduced to 25 mg BID. Discharge weight was 276 pounds.   He was lost to f/u in the [facility] from 2019 to 2022.   He was admitted by [facility] from 3/1-11/18/20 w/ acute hypoxic respiratory failure due to a/c diastolic heart failure w/ prominent RV dysfunction w/ massive anasarca/ ascites requiring paracentesis and IV Lasix. Also w/ viral pneumonitis  in the setting of acute COVID infection, treated w/ steroids and Remdesivir. 2D echo showed normal LVEF at 55%. RV noted to be severely enlarged w/ moderately reduced systolic function and severely elevated pulmonary artery systolic pressure, 73.7 mmHg w/ D-shaped  interventricular septum suggestive of RV pressure/volume overload.  V/Q scan was negative for PE. He was diuresed w/ IV Lasix down to 276 lb w/ significant symptomatic improvement. He was transitioned back to PO torsemide at 40 mg bid and instructed to f/u in the [facility].   RHC was done in 6/22 showing normal filling pressures and moderate pulmonary hypertension w/ with PVR 3.1 WU. 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 He was placed on a trial of Tadalafil but unfortunately did not tolerate due to severe HAs and self-discontinued. Also of note, viral hepatis panel was negative.   Echo in 11/23 showed EF 65-70%, mild LVH, mild RV dysfunction with normal size, PASP 43 mmHg, IVC dilated.   He returns for followup of CHF and pulmonary hypertension.  Weight is stable. No ETOH use or smoking. He is short of breath after working out in his yard for about 30 minutes.  Knee pain is more limiting than dyspnea.  No dyspnea generally walking on flat ground.  No chest pain. No orthopnea/PND.  No lightheadedness.   ECG (personally reviewed): atrial fibrillation, iRBBB  Labs (3/22): K 3.9, creatinine 1.33 Labs (6/22): Scr 1.56, K 4.3 Labs (6/22): SCr 2.80, K 3.9  Labs (7/22): SCr 1.81, K 4.3  Labs (10/23): K 4.4, creatinine 1.33 Labs (9/24): K 4.3, creatinine 1.46, LFTs normal hgb 14.2  PMH: 1. Atrial fibrillation: Permanent.  2. CKD stage 3 3. Cirrhosis: Abdominal US (3/22) with cirrhosis.  4. Chronic diastolic CHF with prominent RV dysfunction: Echo in (3/22) with EF 55%, severely dilated RV with moderately decreased systolic function, PASP 74 mmHg, D-shaped septum, dilated IVC - LHC/RHC (7/18) with 75% distal LAD stenosis (small caliber distal vessel); mean RA 23, PA 94/35, mean 31, CI 3.09 - V/Q scan 3/22: No evidence for chronic PE.  - Echo (5/22): EF 65-70%, Mild LVH, moderately decreased RV systolic function, moderate biatrial enlargement, PASP 70 mmHg.  - RHC (6/22): mean RA  5, PA 62/23 mean 36, mean PCWP 13, CI 3.3, PVR 3.1 WU - Echo (11/23): EF 65-70%, mild LVH, mild RV dysfunction with normal size, PASP 43 mmHg, IVC dilated.  5. PFTs (2018): Moderate-severe obstruction.  6. CAD: LHC (7/18) with 75% distal LAD stenosis (small caliber distal vessel) 7. HTN 8. Osteoarthritis   Current Outpatient Medications  Medication Sig Dispense Refill   albuterol (PROVENTIL HFA;VENTOLIN HFA) 108  (90 Base) MCG/ACT inhaler Inhale 2 puffs into the lungs every 6 (six) hours as needed for wheezing or shortness of breath. 1 Inhaler 0   diclofenac Sodium (VOLTAREN) 1 % GEL Apply 4 g topically 4 (four) times daily. 100 g 1   gabapentin (NEURONTIN) 300 MG capsule Take 1 capsule (300 mg total) by mouth 2 (two) times daily. 180 capsule 1   glipiZIDE (GLUCOTROL) 5 MG tablet Take 0.5 tablets (2.5 mg total) by mouth 2 (two) times daily before a meal. 45 tablet 1   Misc. Devices MISC Blood pressure monitor.  Diagnosis- hypertension 1 each 0   predniSONE (DELTASONE) 20 MG tablet Take 1 tablet (20 mg total) by mouth daily with breakfast. 5 tablet 0   amLODipine (NORVASC) 10 MG tablet Take 1 tablet (10 mg total) by mouth daily. 90 tablet 3   atorvastatin (LIPITOR) 20 MG tablet Take 1 tablet (20 mg total) by mouth daily. 90 tablet 3   dapagliflozin propanediol (FARXIGA) 10 MG TABS tablet Take 1 tablet (10 mg total) by mouth daily before breakfast. 90 tablet 3   rivaroxaban (XARELTO) 20 MG TABS tablet Take 1 tablet (20 mg total) by mouth once daily with supper. (Needs PCP office visit for more refills). 90 tablet 3   spironolactone (ALDACTONE) 25 MG tablet Take 2 tablets (50 mg total) by mouth daily. 180 tablet 3   torsemide (DEMADEX) 20 MG tablet Take 2 tablets (40 mg total) by mouth every morning AND 1 tablet (20 mg total) every evening. 200 tablet 3   No current facility-administered medications for this encounter.   Allergies  Allergen Reactions   Tadalafil Other (See Comments)    Patient experiences a head ache with this medication.    Social History   Socioeconomic History   Marital status: Divorced    Spouse name: Not on file   Number of children: 1   Years of education: Not on file   Highest education level: Not on file  Occupational History   Not on file  Tobacco Use   Smoking status: Never   Smokeless tobacco: Never  Vaping Use   Vaping status: Never Used  Substance and Sexual  Activity   Alcohol use: No    Comment: gave up drinking ~20 years ago   Drug use: Yes    Types: Marijuana    Comment: daily   Sexual activity: Not on file  Other Topics Concern   Not on file  Social History Narrative   Lives alone    Social Drivers of Health   Financial Resource Strain: Low Risk  (10/12/2022)   Overall Financial Resource Strain (CARDIA)    Difficulty of Paying Living Expenses: Not hard at all  Food Insecurity: No Food Insecurity (10/12/2022)   Hunger Vital Sign    Worried About Running Out of Food in the Last Year: Never true    Ran Out of Food in the Last Year: Never true  Transportation Needs: No Transportation Needs (10/12/2022)   PRAPARE - Administrator, Civil Service (Medical): No  Lack of Transportation (Non-Medical): No  Physical Activity: Insufficiently Active (10/12/2022)   Exercise Vital Sign    Days of Exercise per Week: 2 days    Minutes of Exercise per Session: 20 min  Stress: No Stress Concern Present (10/12/2022)   [employer] of Occupational Health - Occupational Stress Questionnaire    Feeling of Stress : Not at all  Social Connections: Socially Isolated (10/12/2022)   Social Connection and Isolation Panel [NHANES]    Frequency of Communication with Friends and Family: More than three times a week    Frequency of Social Gatherings with Friends and Family: Three times a week    Attends Religious Services: Never    Active Member of Clubs or Organizations: No    Attends Banker Meetings: Never    Marital Status: Divorced  Catering manager Violence: Not At Risk (10/12/2022)   Humiliation, Afraid, Rape, and Kick questionnaire    Fear of Current or Ex-Partner: No    Emotionally Abused: No    Physically Abused: No    Sexually Abused: No    Family History  Problem Relation Age of Onset   Diabetes Mellitus II Mother        ESRD   Diabetes Mellitus II Brother    Emphysema Father    Vitals:   10/09/23 1154  BP:  138/70  Pulse: (!) 56  SpO2: 98%  Weight: 112.7 kg (248 lb 6.4 oz)   Wt Readings from Last 3 Encounters:  10/09/23 112.7 kg (248 lb 6.4 oz)  08/28/23 115.8 kg (255 lb 3.2 oz)  02/22/23 113.9 kg (251 lb 3.2 oz)    PHYSICAL EXAM: General: NAD Neck: No JVD, no thyromegaly or thyroid nodule.  Lungs: Clear to auscultation bilaterally with normal respiratory effort. CV: Nondisplaced PMI.  Heart irregular S1/S2, no S3/S4, no murmur.  No peripheral edema.  No carotid bruit.  Normal pedal pulses.  Abdomen: Soft, nontender, no hepatosplenomegaly, no distention.  Skin: Intact without lesions or rashes.  Neurologic: Alert and oriented x 3.  Psych: Normal affect. Extremities: No clubbing or cyanosis.  HEENT: Normal.   ASSESSMENT & PLAN:  1. Pulmonary hypertension: Primarily pulmonary venous hypertension, WHO Group 2, on RHC in 7/18.  No role at that time for selective pulmonary vasodilators. PFTs in 2018 c/w moderate-severe obstructive airway disease. V/Q scan negative in 3/22.  Significant RV dysfunction by echo, PASP estimate on 5/22 echo was 70 mmHg.  RHC 6/22 showed normal filling pressures, moderate pulmonary hypertension with PVR 3.1 WU. 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 He was placed on trial of tadalafil but did not tolerate due to severe HAs. Echo in 11/23 showed EF 65-70%, mild LVH, mild RV dysfunction with normal size, PASP 43 mmHg, IVC dilated.  - Refused sleep study  - Check BNP.  - I will arrange for repeat echo.  If RV is abnormal or PA pressure estimate is elevated, I would recommend repeat RHC to reassess PH as he is currently not on selective pulmonary vasodilators.  2. Chronic diastolic HF w/ Prominent RV Failure: Echo in 3/18 with LV EF 60-65%, RV severely dilated with moderate to severely decreased systolic function. Filling pressures  markedly elevated on RHC in 2018. Admission in 3/22 with RV failure w/ marked fluid overload and anasarca/ ascites requiring paracentesis.  Echo in 5/22 showed EF 65-70%, Mild LVH,  moderately decreased RV systolic function, moderate biatrial enlargement, PASP 70 mmHg. Most recent echo in 11/23 showed EF 65-70%, mild LVH, mild RV dysfunction with normal size, PASP 43 mmHg, IVC dilated. NYHA class II.  He does not look volume overloaded on exam.  - Continue torsemide 40 qam/20 qpm.  - Continue spironolactone 50 mg daily.  BMET/BNP today.   - Continue dapagliflozin 10 mg daily.  3. CAD: 75% distal LAD lesion on 7/18 cath. Denies chest pain  - Continue atorvastatin, check lipids today.   - No ASA with need for Xarelto and stable CAD 4. Afib: Permanent. Good rate control.  - Continue Xarelto for anticoagulation.  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 He is not sure what type of congenital issue he had. 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  RHC in 7/18 did not suggest residual defect (no evidence for significant shunt).  6. HTN: BP controlled. .   7. CKD: Stage 3.  - check BMET today  8. Cirrhosis: Cause uncertain.  He is not drinking ETOH but used to drink heavily in the past.  Viral hepatitis panel negative.  - Check LFTs today.   Followup in 2 months with APP.   Marca Ancona 10/09/2023\

## 2023-10-13 ENCOUNTER — Other Ambulatory Visit: Payer: Self-pay

## 2023-10-23 ENCOUNTER — Ambulatory Visit (HOSPITAL_COMMUNITY)
Admission: RE | Admit: 2023-10-23 | Discharge: 2023-10-23 | Disposition: A | Discharge status: Home / Self Care | Payer: Medicare Other | Source: Ambulatory Visit | Attending: *Deleted | Admitting: *Deleted

## 2023-10-23 DIAGNOSIS — E1151 Type 2 diabetes mellitus with diabetic peripheral angiopathy without gangrene: Secondary | ICD-10-CM | POA: Diagnosis not present

## 2023-10-23 DIAGNOSIS — I5032 Chronic diastolic (congestive) heart failure: Secondary | ICD-10-CM | POA: Insufficient documentation

## 2023-10-23 DIAGNOSIS — I4891 Unspecified atrial fibrillation: Secondary | ICD-10-CM | POA: Diagnosis not present

## 2023-10-23 LAB — ECHOCARDIOGRAM COMPLETE
AR max vel: 2.55 cm2
AV Area VTI: 2.5 cm2
AV Area mean vel: 2.7 cm2
AV Mean grad: 4 mm[Hg]
AV Peak grad: 8.9 mm[Hg]
Ao pk vel: 1.49 m/s
Calc EF: 67.8 %
MV VTI: 2.59 cm2
S' Lateral: 3.6 cm
Single Plane A2C EF: 72.6 %
Single Plane A4C EF: 62.6 %

## 2023-10-30 ENCOUNTER — Other Ambulatory Visit: Payer: Self-pay

## 2023-10-31 ENCOUNTER — Other Ambulatory Visit: Payer: Self-pay

## 2023-11-27 ENCOUNTER — Other Ambulatory Visit: Payer: Self-pay

## 2023-11-28 ENCOUNTER — Other Ambulatory Visit: Payer: Self-pay

## 2023-11-29 NOTE — Progress Notes (Addendum)
 ADVANCED HF CLINIC NOTE    PCP: [name], MD HF Cardiology: Dr. Shirlee Latch   Chief Complaint: Pulmonary Hypertension Follow-up HPI:  Mr. Isham is a 68 y.o. with a past medical history of chronic diastolic CHF, atrial fibrillation, DM.    R/LHC in 7/18 with elevated filling pressures and severe pulmonary hypertension (predominantly venous). Also had 75% stenosis in the distal LAD.   He was lost to f/u in the [facility] from 2019 to 2022.   Echo 3/22 showed EF 55% with mod reduced RV and RVSP 73.7 mmHg with D shaped septum. In the setting of hypoxic respiratory failure and COVID PNA. V/Q scan negative for PE.   RHC 6/22 showing normal filling pressures and moderate pulmonary hypertension w/ with PVR 3.1 WU. 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 He was placed on a trial of Tadalafil but unfortunately did not tolerate due to severe HAs and self-discontinued. Viral hepatis panel was negative.   Echo 11/23 showed EF 65-70%, mild LVH, mild RV dysfunction with normal size, PASP 43 mmHg, IVC dilated.  Echo 2/25: EF 65-70% with mildly reduced RV and severely elevated RVSP 61.3 mmHg.  Today he returns for pulmonary hypertension follow up. Overall feeling fine. Weight is stable. Denies increasing SOB, palpitations, abnormal bleeding, CP, dizziness, edema, or PND/Orthopnea. Biggest limitation is R knee pain/arthritis. Recently finished 5 day prednisone burst on Friday. Appetite ok. Endorses he drinks a lot of volume throughout the day and knows he needs to cut back. No fever or chills. Weight at home 255 lbs, does not weigh every day. Taking all medications.   PMH: 1. Atrial fibrillation: Permanent.  2. CKD stage 3 3. Cirrhosis: Abdominal US (3/22) with cirrhosis.  4. Chronic diastolic CHF with prominent RV dysfunction: Echo in (3/22) with EF 55%, severely dilated RV with moderately  decreased systolic function, PASP 74 mmHg, D-shaped septum, dilated IVC - LHC/RHC (7/18) with 75% distal LAD stenosis (small caliber distal vessel); mean RA 23, PA 94/35, mean 31, CI 3.09 - V/Q scan 3/22: No evidence for chronic PE.  - Echo (5/22): EF 65-70%, Mild LVH, moderately decreased RV systolic function, moderate biatrial enlargement, PASP 70 mmHg.  - RHC (6/22): mean RA 5, PA 62/23 mean 36, mean PCWP 13, CI 3.3, PVR 3.1 WU - Echo (11/23): EF 65-70%, mild LVH, mild RV dysfunction with normal size, PASP 43 mmHg, IVC dilated.  5. PFTs (2018): Moderate-severe obstruction.  6. CAD: LHC (7/18) with 75% distal LAD stenosis (small caliber distal vessel) 7. HTN 8. Osteoarthritis  Current Outpatient Medications  Medication Sig Dispense Refill   albuterol (PROVENTIL HFA;VENTOLIN HFA) 108 (90 Base) MCG/ACT inhaler Inhale 2 puffs into the lungs every 6 (six) hours as needed for wheezing or shortness of breath. 1 Inhaler 0   amLODipine (NORVASC) 10 MG tablet Take 1 tablet (10 mg total) by mouth daily. 90 tablet 3   atorvastatin (LIPITOR) 20 MG tablet Take 1 tablet (20 mg total) by mouth daily. 90 tablet 3   dapagliflozin propanediol (FARXIGA) 10 MG TABS tablet Take 1 tablet (10 mg total) by mouth daily before breakfast. 90 tablet 3   diclofenac Sodium (VOLTAREN) 1 % GEL Apply 4 g topically 4 (four) times daily. 100 g 1   gabapentin (NEURONTIN) 300 MG capsule Take 1 capsule (300 mg total) by mouth 2 (two) times daily. 180 capsule 1   glipiZIDE (GLUCOTROL)  5 MG tablet Take 0.5 tablets (2.5 mg total) by mouth 2 (two) times daily before a meal. 45 tablet 1   Misc. Devices MISC Blood pressure monitor.  Diagnosis- hypertension 1 each 0   predniSONE (DELTASONE) 20 MG tablet Take 1 tablet (20 mg total) by mouth daily with breakfast. (Patient taking differently: Take 20 mg by mouth daily with breakfast. As needed) 5 tablet 0   rivaroxaban (XARELTO) 20 MG TABS tablet Take 1 tablet (20 mg total) by mouth once  daily with supper. (Needs PCP office visit for more refills). 90 tablet 3   spironolactone (ALDACTONE) 25 MG tablet Take 2 tablets (50 mg total) by mouth daily. 180 tablet 3   torsemide (DEMADEX) 20 MG tablet Take 2 tablets (40 mg total) by mouth every morning AND 1 tablet (20 mg total) every evening. 270 tablet 3   No current facility-administered medications for this encounter.   Allergies  Allergen Reactions   Tadalafil Other (See Comments)    Patient experiences a head ache with this medication.   Social History   Socioeconomic History   Marital status: Divorced    Spouse name: Not on file   Number of children: 1   Years of education: Not on file   Highest education level: Not on file  Occupational History   Not on file  Tobacco Use   Smoking status: Never   Smokeless tobacco: Never  Vaping Use   Vaping status: Never Used  Substance and Sexual Activity   Alcohol use: No    Comment: gave up drinking ~20 years ago   Drug use: Yes    Types: Marijuana    Comment: daily   Sexual activity: Not on file  Other Topics Concern   Not on file  Social History Narrative   Lives alone    Social Drivers of Health   Financial Resource Strain: Low Risk  (10/12/2022)   Overall Financial Resource Strain (CARDIA)    Difficulty of Paying Living Expenses: Not hard at all  Food Insecurity: No Food Insecurity (10/12/2022)   Hunger Vital Sign    Worried About Running Out of Food in the Last Year: Never true    Ran Out of Food in the Last Year: Never true  Transportation Needs: No Transportation Needs (10/12/2022)   PRAPARE - Administrator, Civil Service (Medical): No    Lack of Transportation (Non-Medical): No  Physical Activity: Insufficiently Active (10/12/2022)   Exercise Vital Sign    Days of Exercise per Week: 2 days    Minutes of Exercise per Session: 20 min  Stress: No Stress Concern Present (10/12/2022)   [employer] of Occupational Health - Occupational Stress  Questionnaire    Feeling of Stress : Not at all  Social Connections: Socially Isolated (10/12/2022)   Social Connection and Isolation Panel [NHANES]    Frequency of Communication with Friends and Family: More than three times a week    Frequency of Social Gatherings with Friends and Family: Three times a week    Attends Religious Services: Never    Active Member of Clubs or Organizations: No    Attends Banker Meetings: Never    Marital Status: Divorced  Catering manager Violence: Not At Risk (10/12/2022)   Humiliation, Afraid, Rape, and Kick questionnaire    Fear of Current or Ex-Partner: No    Emotionally Abused: No    Physically Abused: No    Sexually Abused: No    Family History  Problem Relation Age of Onset   Diabetes Mellitus II Mother        ESRD   Diabetes Mellitus II Brother    Emphysema Father    Vitals:   12/04/23 1423  BP: (!) 124/56  Pulse: 68  SpO2: 96%  Weight: 115 kg (253 lb 9.6 oz)   Wt Readings from Last 3 Encounters:  12/04/23 115 kg (253 lb 9.6 oz)  10/09/23 112.7 kg (248 lb 6.4 oz)  08/28/23 115.8 kg (255 lb 3.2 oz)   PHYSICAL EXAM: General: Well appearing. No distress on RA. Walked into clinic Cardiac: JVP ~7cm +HJR. S1 and S2 present. No murmurs or rub. Resp: Lung sounds clear and equal B/L Abdomen: Soft, non-tender, non-distended.  Extremities: Warm and dry.  No peripheral edema.  Neuro: Alert and oriented x3. Affect pleasant. Moves all extremities without difficulty.  ASSESSMENT & PLAN:  1. Pulmonary hypertension: Primarily pulmonary venous hypertension, WHO Group 2, on RHC in 7/18.  No role at that time for selective pulmonary vasodilators. PFTs in 2018 c/w moderate-severe obstructive airway disease. V/Q scan negative in 3/22.  Significant RV dysfunction by echo, PASP estimate on 5/22 echo was 70 mmHg.  RHC 6/22 showed normal filling pressures, moderate pulmonary hypertension with PVR 3.1 WU. Likely a component of portopulmonary HTN  from cirrhosis, also pressure may be elevated in part due to relatively high cardiac output in setting of cirrhosis as well. Did not tolerate tadalafil due to severe HAs. Echo in 11/23 showed EF 65-70%, mild LVH, mild RV dysfunction with normal size, PASP 43 mmHg, IVC dilated. Echo 1/25 with RVSP 61.3 mmHg. Dr. Shirlee Latch felt that he may need a RHC in the near future, however patient reports that he is feeling well from a symptoms standpoint and does not want to undergo procedure at this time.  - Discussed sleep study with patient, however refused.  - Needs to adhere to fluid restriction 2. Chronic diastolic HF w/ Prominent RV Failure: Echo in 3/18 with LV EF 60-65%, RV severely dilated with moderate to severely decreased systolic function. Filling pressures markedly elevated on RHC in 2018. Admission in 3/22 with RV failure w/ marked fluid overload and anasarca/ ascites requiring paracentesis.  Echo in 5/22 showed EF 65-70%, Mild LVH, moderately decreased RV systolic function, moderate biatrial enlargement, PASP 70 mmHg. Echo in 11/23 showed EF 65-70%, mild LVH, mild RV dysfunction with normal size, PASP 43 mmHg, IVC dilated. Echo 2/25 65-70% w RV severely dilated and mildly reduced function. - NYHA II (limited by R knee arthritis). Volume okay.  - Continue torsemide 40 qam/20 qpm.  - Continue spironolactone 50 mg daily.  - Continue dapagliflozin 10 mg daily.  - [facility] today for [facility] send out labs. Will have results faxed over for review. 3. CAD: 75% distal LAD lesion on 7/18 cath. Denies chest pain  - Continue atorvastatin. LDL at goal. - No ASA with need for Xarelto. No s/s bleeding. 4. Afib: Permanent. Good rate control.  - Continue Xarelto for anticoagulation.  5. Congenital Heart Disease:  Underwent surgical repair at age 16. He is not sure what type of congenital issue he had. 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  RHC in 7/18 did not suggest residual defect  (no evidence for significant shunt).  6. HTN: BP controlled. 7. CKD: Stage 3.  - has appointment with [facility] this week. Will follow [facility] labs today.  8. Cirrhosis: Cause uncertain.  He is not  drinking ETOH but used to drink heavily in the past. Viral hepatitis panel negative. LFTs normal.  Follow up in 3 months with Dr. McLean  [place] 12/04/2023

## 2023-12-01 ENCOUNTER — Telehealth (HOSPITAL_COMMUNITY): Payer: Self-pay

## 2023-12-01 NOTE — Telephone Encounter (Signed)
 Called to confirm/remind patient of their appointment at the Advanced Heart Failure Clinic on 12/04/23.   Appointment:   [x] Confirmed  [] Left mess   [] No answer/No voice mail  [] Phone not in service  Patient reminded to bring all medications and/or complete list.  Confirmed patient has transportation. Gave directions, instructed to utilize valet parking.

## 2023-12-01 NOTE — Telephone Encounter (Signed)
 error

## 2023-12-04 ENCOUNTER — Encounter (HOSPITAL_COMMUNITY): Payer: Self-pay

## 2023-12-04 ENCOUNTER — Other Ambulatory Visit: Payer: Self-pay

## 2023-12-04 ENCOUNTER — Ambulatory Visit (HOSPITAL_COMMUNITY)
Admission: RE | Admit: 2023-12-04 | Discharge: 2023-12-04 | Disposition: A | Discharge status: Home / Self Care | Payer: Medicare Other | Source: Ambulatory Visit | Attending: Cardiology | Admitting: Cardiology

## 2023-12-04 VITALS — BP 124/56 | HR 68 | Wt 253.6 lb

## 2023-12-04 DIAGNOSIS — I4821 Permanent atrial fibrillation: Secondary | ICD-10-CM | POA: Diagnosis not present

## 2023-12-04 DIAGNOSIS — K7460 Unspecified cirrhosis of liver: Secondary | ICD-10-CM | POA: Diagnosis not present

## 2023-12-04 DIAGNOSIS — Z79899 Other long term (current) drug therapy: Secondary | ICD-10-CM | POA: Insufficient documentation

## 2023-12-04 DIAGNOSIS — M1711 Unilateral primary osteoarthritis, right knee: Secondary | ICD-10-CM | POA: Diagnosis not present

## 2023-12-04 DIAGNOSIS — I272 Pulmonary hypertension, unspecified: Secondary | ICD-10-CM | POA: Diagnosis present

## 2023-12-04 DIAGNOSIS — Z7901 Long term (current) use of anticoagulants: Secondary | ICD-10-CM | POA: Insufficient documentation

## 2023-12-04 DIAGNOSIS — E1122 Type 2 diabetes mellitus with diabetic chronic kidney disease: Secondary | ICD-10-CM | POA: Insufficient documentation

## 2023-12-04 DIAGNOSIS — I130 Hypertensive heart and chronic kidney disease with heart failure and stage 1 through stage 4 chronic kidney disease, or unspecified chronic kidney disease: Secondary | ICD-10-CM | POA: Diagnosis not present

## 2023-12-04 DIAGNOSIS — Z7984 Long term (current) use of oral hypoglycemic drugs: Secondary | ICD-10-CM | POA: Insufficient documentation

## 2023-12-04 DIAGNOSIS — N1831 Chronic kidney disease, stage 3a: Secondary | ICD-10-CM

## 2023-12-04 DIAGNOSIS — I50810 Right heart failure, unspecified: Secondary | ICD-10-CM

## 2023-12-04 DIAGNOSIS — N183 Chronic kidney disease, stage 3 unspecified: Secondary | ICD-10-CM | POA: Diagnosis not present

## 2023-12-04 DIAGNOSIS — I2510 Atherosclerotic heart disease of native coronary artery without angina pectoris: Secondary | ICD-10-CM | POA: Diagnosis not present

## 2023-12-04 DIAGNOSIS — Z8616 Personal history of COVID-19: Secondary | ICD-10-CM | POA: Diagnosis not present

## 2023-12-04 DIAGNOSIS — I5032 Chronic diastolic (congestive) heart failure: Secondary | ICD-10-CM

## 2023-12-04 DIAGNOSIS — I10 Essential (primary) hypertension: Secondary | ICD-10-CM

## 2023-12-04 MED ORDER — TORSEMIDE 20 MG PO TABS
ORAL_TABLET | ORAL | 3 refills | Status: AC
  Filled 2023-12-04 – 2024-02-02 (×2): qty 270, 90d supply, fill #0
  Filled 2024-04-30: qty 270, 90d supply, fill #1

## 2023-12-04 NOTE — Patient Instructions (Addendum)
Thank you for coming in today  If you had labs drawn today, any labs that are abnormal the clinic will call you No news is good news  Medications: No changes  Follow up appointments:  Your physician recommends that you schedule a follow-up appointment in:  3 months With Dr. Shirlee Latch    Do the following things EVERYDAY: Weigh yourself in the morning before breakfast. Write it down and keep it in a log. Take your medicines as prescribed Eat low salt foods--Limit salt (sodium) to 2000 mg per day.  Stay as active as you can everyday Limit all fluids for the day to less than 2 liters   At the [facility], you and your health needs are our priority. As part of our continuing mission to provide you with exceptional heart care, we have created designated Provider Care Teams. These Care Teams include your primary Cardiologist (physician) and Advanced Practice Providers (APPs- Physician Assistants and Nurse Practitioners) who all work together to provide you with the care you need, when you need it.   You may see any of the following providers on your designated Care Team at your next follow up: Dr Arvilla Meres Dr Marca Ancona Dr. Marcos Eke, NP Robbie Lis, [place] [facility] [place], [place] Brynda Peon, NP Karle Plumber, PharmD   Please be sure to bring in all your medications bottles to every appointment.    Thank you for choosing [facility]  If you have any questions or concerns before your next appointment please send [place] a message through [place] or call our office at [phone].    TO LEAVE A MESSAGE FOR THE NURSE SELECT OPTION 2, PLEASE LEAVE A MESSAGE INCLUDING: YOUR NAME DATE OF BIRTH CALL BACK NUMBER REASON FOR CALL**this is important as we prioritize the call backs  YOU WILL RECEIVE A CALL BACK THE SAME DAY AS LONG AS YOU CALL BEFORE 4:00 PM

## 2024-01-30 ENCOUNTER — Encounter (HOSPITAL_COMMUNITY): Admitting: Cardiology

## 2024-02-02 ENCOUNTER — Other Ambulatory Visit: Payer: Self-pay

## 2024-02-02 ENCOUNTER — Other Ambulatory Visit: Payer: Self-pay | Admitting: Orthopedic Surgery

## 2024-02-02 MED ORDER — PREDNISONE 10 MG PO TABS
10.0000 mg | ORAL_TABLET | Freq: Every day | ORAL | 0 refills | Status: DC
  Filled 2024-02-02: qty 30, 30d supply, fill #0

## 2024-02-23 ENCOUNTER — Other Ambulatory Visit (HOSPITAL_COMMUNITY): Payer: Self-pay

## 2024-02-23 ENCOUNTER — Ambulatory Visit: Payer: Self-pay

## 2024-02-23 ENCOUNTER — Other Ambulatory Visit: Payer: Self-pay

## 2024-02-23 NOTE — Telephone Encounter (Signed)
 FYI Only or Action Required?: Action required by provider  Patient was last seen in primary care on 08/28/2023 by Joaquin Mulberry, MD. Called Nurse Triage reporting No chief complaint on file.. Symptoms began about a month ago. Interventions attempted: Prescription medications: Salve (Rx) and Rest, hydration, or home remedies. Symptoms are: gradually improving.  Triage Disposition: See HCP Within 4 Hours (Or PCP Triage)  Patient/caregiver understands and will follow disposition?: No, refuses disposition  Copied from CRM [id number]. Topic: Clinical - Red Word Triage >> Feb 23, 2024 10:42 AM Zipporah Him wrote: Red Word that prompted transfer to Nurse Triage: Patient is having severe pain in his right leg, he states he has not been able to get up and move around much and is concerned that it is not getting better  Reason for Disposition  [1] SEVERE pain (e.g., excruciating, unable to do any normal activities) AND [2] not improved after 2 hours of pain medicine  Answer Assessment - Initial Assessment Questions 1. ONSET: When did the pain start?      Over a week ago  2. LOCATION: Where is the pain located?      Right Leg, Knee mostly  3. PAIN: How bad is the pain?    (Scale 1-10; or mild, moderate, severe)   -  MILD (1-3): doesn't interfere with normal activities    -  MODERATE (4-7): interferes with normal activities (e.g., work or school) or awakens from sleep, limping    -  SEVERE (8-10): excruciating pain, unable to do any normal activities, unable to walk     Severe  4. WORK OR EXERCISE: Has there been any recent work or exercise that involved this part of the body?      No  5. CAUSE: What do you think is causing the leg pain?     Unsure  6. OTHER SYMPTOMS: Do you have any other symptoms? (e.g., chest pain, back pain, breathing difficulty, swelling, rash, fever, numbness, weakness)     Swelling, Weakness  Protocols used: Leg Pain-A-AH

## 2024-02-23 NOTE — Telephone Encounter (Signed)
 Patient has upcoming appointment

## 2024-02-26 ENCOUNTER — Other Ambulatory Visit: Payer: Self-pay

## 2024-02-26 ENCOUNTER — Encounter: Payer: Self-pay | Admitting: Family Medicine

## 2024-02-26 ENCOUNTER — Ambulatory Visit: Payer: Medicare Other | Attending: Family Medicine | Admitting: Family Medicine

## 2024-02-26 VITALS — BP 114/73 | HR 86 | Ht 73.0 in | Wt 239.6 lb

## 2024-02-26 DIAGNOSIS — N1831 Chronic kidney disease, stage 3a: Secondary | ICD-10-CM | POA: Insufficient documentation

## 2024-02-26 DIAGNOSIS — Z7901 Long term (current) use of anticoagulants: Secondary | ICD-10-CM | POA: Diagnosis not present

## 2024-02-26 DIAGNOSIS — L97519 Non-pressure chronic ulcer of other part of right foot with unspecified severity: Secondary | ICD-10-CM | POA: Diagnosis not present

## 2024-02-26 DIAGNOSIS — I110 Hypertensive heart disease with heart failure: Secondary | ICD-10-CM | POA: Diagnosis not present

## 2024-02-26 DIAGNOSIS — Z7984 Long term (current) use of oral hypoglycemic drugs: Secondary | ICD-10-CM | POA: Diagnosis not present

## 2024-02-26 DIAGNOSIS — Z79899 Other long term (current) drug therapy: Secondary | ICD-10-CM | POA: Diagnosis not present

## 2024-02-26 DIAGNOSIS — I5042 Chronic combined systolic (congestive) and diastolic (congestive) heart failure: Secondary | ICD-10-CM | POA: Insufficient documentation

## 2024-02-26 DIAGNOSIS — I130 Hypertensive heart and chronic kidney disease with heart failure and stage 1 through stage 4 chronic kidney disease, or unspecified chronic kidney disease: Secondary | ICD-10-CM | POA: Diagnosis not present

## 2024-02-26 DIAGNOSIS — K7460 Unspecified cirrhosis of liver: Secondary | ICD-10-CM | POA: Insufficient documentation

## 2024-02-26 DIAGNOSIS — E1142 Type 2 diabetes mellitus with diabetic polyneuropathy: Secondary | ICD-10-CM | POA: Diagnosis not present

## 2024-02-26 DIAGNOSIS — Z8774 Personal history of (corrected) congenital malformations of heart and circulatory system: Secondary | ICD-10-CM | POA: Insufficient documentation

## 2024-02-26 DIAGNOSIS — M25561 Pain in right knee: Secondary | ICD-10-CM | POA: Diagnosis present

## 2024-02-26 DIAGNOSIS — I272 Pulmonary hypertension, unspecified: Secondary | ICD-10-CM | POA: Insufficient documentation

## 2024-02-26 DIAGNOSIS — M1711 Unilateral primary osteoarthritis, right knee: Secondary | ICD-10-CM | POA: Insufficient documentation

## 2024-02-26 DIAGNOSIS — I480 Paroxysmal atrial fibrillation: Secondary | ICD-10-CM | POA: Insufficient documentation

## 2024-02-26 DIAGNOSIS — E1151 Type 2 diabetes mellitus with diabetic peripheral angiopathy without gangrene: Secondary | ICD-10-CM | POA: Diagnosis not present

## 2024-02-26 DIAGNOSIS — E118 Type 2 diabetes mellitus with unspecified complications: Secondary | ICD-10-CM

## 2024-02-26 DIAGNOSIS — E1165 Type 2 diabetes mellitus with hyperglycemia: Secondary | ICD-10-CM | POA: Insufficient documentation

## 2024-02-26 DIAGNOSIS — I739 Peripheral vascular disease, unspecified: Secondary | ICD-10-CM | POA: Insufficient documentation

## 2024-02-26 DIAGNOSIS — E1122 Type 2 diabetes mellitus with diabetic chronic kidney disease: Secondary | ICD-10-CM | POA: Insufficient documentation

## 2024-02-26 DIAGNOSIS — E11621 Type 2 diabetes mellitus with foot ulcer: Secondary | ICD-10-CM | POA: Insufficient documentation

## 2024-02-26 LAB — POCT GLYCOSYLATED HEMOGLOBIN (HGB A1C): HbA1c, POC (controlled diabetic range): 9 % — AB (ref 0.0–7.0)

## 2024-02-26 MED ORDER — BACITRACIN 500 UNIT/GM EX OINT
1.0000 | TOPICAL_OINTMENT | Freq: Two times a day (BID) | CUTANEOUS | 0 refills | Status: AC
  Filled 2024-02-26: qty 15, 8d supply, fill #0

## 2024-02-26 MED ORDER — GABAPENTIN 300 MG PO CAPS
300.0000 mg | ORAL_CAPSULE | Freq: Two times a day (BID) | ORAL | 1 refills | Status: AC
  Filled 2024-02-26 – 2024-05-27 (×2): qty 180, 90d supply, fill #0

## 2024-02-26 MED ORDER — DICLOFENAC SODIUM 1 % EX GEL
4.0000 g | Freq: Four times a day (QID) | CUTANEOUS | 1 refills | Status: AC
  Filled 2024-02-26: qty 100, 14d supply, fill #0

## 2024-02-26 MED ORDER — GLIPIZIDE 5 MG PO TABS
2.5000 mg | ORAL_TABLET | Freq: Two times a day (BID) | ORAL | 1 refills | Status: AC
  Filled 2024-02-26: qty 45, 45d supply, fill #0
  Filled 2024-05-27: qty 45, 45d supply, fill #1
  Filled 2024-07-09: qty 45, 45d supply, fill #2

## 2024-02-26 MED ORDER — METHOCARBAMOL 500 MG PO TABS
500.0000 mg | ORAL_TABLET | Freq: Three times a day (TID) | ORAL | 1 refills | Status: AC | PRN
  Filled 2024-02-26: qty 60, 20d supply, fill #0
  Filled 2024-07-09: qty 60, 20d supply, fill #1

## 2024-02-26 NOTE — Patient Instructions (Signed)
 VISIT SUMMARY:  During your visit, we discussed your worsening right knee pain, diabetes management, a blister on your right toe, and your blood pressure. We reviewed your current medications and made some adjustments to better manage your conditions.  YOUR PLAN:  -RIGHT KNEE ARTHRITIS: You have chronic arthritis in your right knee, which has recently worsened. Arthritis is a condition that causes inflammation and pain in the joints. We will continue with your muscle relaxant and topical gel for relief. If your symptoms get worse, we may consider referring you to an orthopedic specialist. Please continue with home exercises to help manage your symptoms.  -DIABETES MELLITUS TYPE 2: Your A1c level, which measures your average blood sugar over the past 3 months, has increased from 6.5% to 9.0%. This is likely due to a lapse in your medication. Diabetes is a condition where your body has trouble regulating blood sugar levels. We have corrected your glipizide  prescription and recommend that you obtain test strips to monitor your blood sugar regularly. We will follow up in 3 months to check your progress.  -RIGHT TOE BLISTER: You had a blister on your right toe that has healed without infection. Blisters can occur due to decreased sensation in your feet, a common issue with diabetes. We have prescribed bacitracin to keep the area clean and recommend that you avoid walking barefoot. Please monitor the area for any signs of infection using a mirror.  -HYPERTENSION: Your blood pressure was initially high but returned to normal upon rechecking. Hypertension is high blood pressure, which can lead to other health issues if not managed. Continue with your current blood pressure medications, and we will recheck it at your next visit.  INSTRUCTIONS:  Please schedule a follow-up appointment in 3 months. We will perform lab tests for potassium due to your spironolactone  medication. Also, ensure you have a follow-up  with cardiology for your congestive heart failure and atrial fibrillation.

## 2024-02-26 NOTE — Progress Notes (Signed)
 Subjective:  Patient ID: Paul Mclaughlin, male    DOB: 1956/07/31  Age: 68 y.o. MRN: [id number]  CC: Medical Management of Chronic Issues (Right knee pain/Concerns about right toe.)     Discussed the use of AI scribe software for clinical note transcription with the patient, who gave verbal consent to proceed.  History of Present Illness JOHNNATHAN Mclaughlin is a 68 year old male with a history of type 2 diabetes mellitus, hypertension, atrial fibrillation (on anticoagulation with Xarelto  and rate control with metoprolol ), history of Congenital heart disease status post surgery, Pulmonary hypertension, congestive heart failure ( EF of 65-70% from 2-D echo 07/2022, mild LVH,  RV function mildly reduced, mildly elevated PA systolic pressure, moderately dilated LA, IVC dilatation), peripheral arterial disease , liver cirrhosis arthritis who presents with worsening right knee pain.  He has experienced worsening right knee pain over the past month, with an inability to walk about ten days ago. The pain has been intermittent, but he has been able to walk again for the past two days. A previous x-ray in 2023 showed arthritis and effusion in the knee. He uses a topical salve for relief and previously found prednisone  effective. He experiences muscle pain and stiffness in his right leg, attributed to limited movement. He uses a walker and a raised toilet seat for mobility. He recently started walking again and has been more active around the house.   A blister on his right toe, due to decreased sensation, burst and caused bleeding. He manages it with topical salve. He lost a toenail when the toe was previously swollen.  It has been present for close to 2 months.  His A1c increased from 6.5 to 9.0, attributed to running out of glipizide  due to a insufficient glipizide  as he received 45 tablets rather than 90 tablets. He has resumed glipizide  and continues Farxiga  10 mg daily. He has not monitored blood sugar  due to a lack of test strips.  No recent shortness of breath or palpitations. He is compliant with medications for congestive heart failure and atrial fibrillation.  Last cardiology visit was in 11/2023.    Past Medical History:  Diagnosis Date   Atrial fibrillation (HCC)    Cellulitis and abscess of left leg 06/2016   CHF (congestive heart failure) (HCC)    Diabetes (HCC)    Dyspnea     Past Surgical History:  Procedure Laterality Date   AMPUTATION Left 06/24/2016   Procedure: FOOT FIFTH RAY TOE AMPUTATION;  Surgeon: Timothy Ford, MD;  Location: [facility] OR;  Service: Orthopedics;  Laterality: Left;   IR PARACENTESIS  11/12/2020   IR PARACENTESIS  11/13/2020   IR PARACENTESIS  11/16/2020   open heart surgery     As a child.  [age] years old.  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    RIGHT HEART CATH N/A 02/17/2021   Procedure: RIGHT HEART CATH;  Surgeon: Darlis Eisenmenger, MD;  Location: [facility] INVASIVE CV LAB;  Service: Cardiovascular;  Laterality: N/A;   RIGHT/LEFT HEART CATH AND CORONARY ANGIOGRAPHY N/A 03/30/2017   Procedure: Right/Left Heart Cath and Coronary Angiography;  Surgeon: Darlis Eisenmenger, MD;  Location: [facility] INVASIVE CV LAB;  Service: Cardiovascular;  Laterality: N/A;    Family History  Problem Relation Age of Onset   Diabetes Mellitus II Mother        ESRD   Diabetes Mellitus II Brother    Emphysema Father     Social History   Socioeconomic History   Marital  status: Divorced    Spouse name: Not on file   Number of children: 1   Years of education: Not on file   Highest education level: Not on file  Occupational History   Not on file  Tobacco Use   Smoking status: Never   Smokeless tobacco: Never  Vaping Use   Vaping status: Never Used  Substance and Sexual Activity   Alcohol use: No    Comment: gave up drinking ~20 years ago   Drug use: Yes    Types: Marijuana    Comment: daily   Sexual activity: Not on file  Other Topics Concern   Not on file  Social History Narrative   Lives alone     Social Drivers of Health   Financial Resource Strain: Low Risk  (10/12/2022)   Overall Financial Resource Strain (CARDIA)    Difficulty of Paying Living Expenses: Not hard at all  Food Insecurity: No Food Insecurity (10/12/2022)   Hunger Vital Sign    Worried About Running Out of Food in the Last Year: Never true    Ran Out of Food in the Last Year: Never true  Transportation Needs: No Transportation Needs (10/12/2022)   PRAPARE - Administrator, Civil Service (Medical): No    Lack of Transportation (Non-Medical): No  Physical Activity: Insufficiently Active (10/12/2022)   Exercise Vital Sign    Days of Exercise per Week: 2 days    Minutes of Exercise per Session: 20 min  Stress: No Stress Concern Present (10/12/2022)   [employer] of Occupational Health - Occupational Stress Questionnaire    Feeling of Stress : Not at all  Social Connections: Socially Isolated (10/12/2022)   Social Connection and Isolation Panel    Frequency of Communication with Friends and Family: More than three times a week    Frequency of Social Gatherings with Friends and Family: Three times a week    Attends Religious Services: Never    Active Member of Clubs or Organizations: No    Attends Banker Meetings: Never    Marital Status: Divorced    Allergies  Allergen Reactions   Tadalafil  Other (See Comments)    Patient experiences a head ache with this medication.    Outpatient Medications Prior to Visit  Medication Sig Dispense Refill   albuterol  (PROVENTIL  HFA;VENTOLIN  HFA) 108 (90 Base) MCG/ACT inhaler Inhale 2 puffs into the lungs every 6 (six) hours as needed for wheezing or shortness of breath. 1 Inhaler 0   amLODipine  (NORVASC ) 10 MG tablet Take 1 tablet (10 mg total) by mouth daily. 90 tablet 3   atorvastatin  (LIPITOR) 20 MG tablet Take 1 tablet (20 mg total) by mouth daily. 90 tablet 3   dapagliflozin  propanediol (FARXIGA ) 10 MG TABS tablet Take 1 tablet (10 mg  total) by mouth daily before breakfast. 90 tablet 3   Misc. Devices MISC Blood pressure monitor.  Diagnosis- hypertension 1 each 0   rivaroxaban  (XARELTO ) 20 MG TABS tablet Take 1 tablet (20 mg total) by mouth once daily with supper. (Needs PCP office visit for more refills). 90 tablet 3   spironolactone  (ALDACTONE ) 25 MG tablet Take 2 tablets (50 mg total) by mouth daily. 180 tablet 3   torsemide  (DEMADEX ) 20 MG tablet Take 2 tablets (40 mg total) by mouth every morning AND 1 tablet (20 mg total) every evening. 270 tablet 3   diclofenac  Sodium (VOLTAREN ) 1 % GEL Apply 4 g topically 4 (four) times daily. 100 g  1   gabapentin  (NEURONTIN ) 300 MG capsule Take 1 capsule (300 mg total) by mouth 2 (two) times daily. 180 capsule 1   glipiZIDE  (GLUCOTROL ) 5 MG tablet Take 0.5 tablets (2.5 mg total) by mouth 2 (two) times daily before a meal. 45 tablet 1   predniSONE  (DELTASONE ) 10 MG tablet Take 1 tablet (10 mg total) by mouth daily with breakfast. (Patient not taking: Reported on 02/26/2024) 30 tablet 0   predniSONE  (DELTASONE ) 20 MG tablet Take 1 tablet (20 mg total) by mouth daily with breakfast. (Patient not taking: Reported on 02/26/2024) 5 tablet 0   No facility-administered medications prior to visit.     ROS Review of Systems  Constitutional:  Negative for activity change and appetite change.  HENT:  Negative for sinus pressure and sore throat.   Respiratory:  Negative for chest tightness, shortness of breath and wheezing.   Cardiovascular:  Negative for chest pain and palpitations.  Gastrointestinal:  Negative for abdominal distention, abdominal pain and constipation.  Genitourinary: Negative.   Musculoskeletal:        See HPI  Skin:  Positive for wound.  Psychiatric/Behavioral:  Negative for behavioral problems and dysphoric mood.     Objective:  BP 114/73   Pulse 86   Ht 6' 1 (1.854 m)   Wt 239 lb 9.6 oz (108.7 kg)   SpO2 99%   BMI 31.61 kg/m      02/26/2024    4:39 PM  02/26/2024    4:00 PM 12/04/2023    2:23 PM  BP/Weight  Systolic BP 114 155 124  Diastolic BP 73 83 56  Wt. (Lbs)  239.6 253.6  BMI  31.61 kg/m2 33.46 kg/m2      Physical Exam Constitutional:      Appearance: He is well-developed.   Cardiovascular:     Rate and Rhythm: Normal rate.     Heart sounds: Normal heart sounds. No murmur heard. Pulmonary:     Effort: Pulmonary effort is normal.     Breath sounds: Normal breath sounds. No wheezing or rales.  Chest:     Chest wall: No tenderness.  Abdominal:     General: Bowel sounds are normal. There is no distension.     Palpations: Abdomen is soft. There is no mass.     Tenderness: There is no abdominal tenderness.   Musculoskeletal:        General: Normal range of motion.     Right lower leg: No edema.     Left lower leg: No edema.   Skin:    Findings: Lesion (Base of R great toe with dry ulcer with dark scab, no drainage noted, no TTP, no erythema) present.   Neurological:     Mental Status: He is alert and oriented to person, place, and time.   Psychiatric:        Mood and Affect: Mood normal.        Latest Ref Rng & Units 10/09/2023   12:15 PM 02/22/2023    3:33 PM 07/28/2022    1:43 PM  CMP  Glucose 70 - 99 mg/dL 161  096  045   BUN 8 - 23 mg/dL 35  34  38   Creatinine 0.61 - 1.24 mg/dL 4.09  8.11  9.14   Sodium 135 - 145 mmol/L 137  139  139   Potassium 3.5 - 5.1 mmol/L 4.1  4.3  4.1   Chloride 98 - 111 mmol/L 100  99  101  CO2 22 - 32 mmol/L 24  23  25    Calcium  8.9 - 10.3 mg/dL 9.4  9.1  9.5   Total Protein 6.5 - 8.1 g/dL 7.5  7.9    Total Bilirubin 0.0 - 1.2 mg/dL 1.2  0.7    Alkaline Phos 38 - 126 U/L 108  143    AST 15 - 41 U/L 22  11    ALT 0 - 44 U/L 20  12      Lipid Panel     Component Value Date/Time   CHOL 96 10/09/2023 1215   CHOL 103 06/29/2021 1620   TRIG 49 10/09/2023 1215   HDL 40 (L) 10/09/2023 1215   HDL 46 06/29/2021 1620   CHOLHDL 2.4 10/09/2023 1215   VLDL 10 10/09/2023 1215    LDLCALC 46 10/09/2023 1215   LDLCALC 42 06/29/2021 1620    CBC    Component Value Date/Time   WBC 7.7 10/09/2023 1215   RBC 4.04 (L) 10/09/2023 1215   HGB 13.0 10/09/2023 1215   HGB 14.1 09/29/2021 1453   HCT 38.3 (L) 10/09/2023 1215   HCT 41.5 09/29/2021 1453   PLT 246 10/09/2023 1215   PLT 274 09/29/2021 1453   MCV 94.8 10/09/2023 1215   MCV 95 09/29/2021 1453   MCH 32.2 10/09/2023 1215   MCHC 33.9 10/09/2023 1215   RDW 13.9 10/09/2023 1215   RDW 12.3 09/29/2021 1453   LYMPHSABS 1.2 09/29/2021 1453   MONOABS 0.4 11/18/2020 0120   EOSABS 0.4 09/29/2021 1453   BASOSABS 0.1 09/29/2021 1453    Lab Results  Component Value Date   HGBA1C 9.0 (A) 02/26/2024    Lab Results  Component Value Date   HGBA1C 9.0 (A) 02/26/2024   HGBA1C 6.5 08/28/2023   HGBA1C 6.6 02/22/2023      1. Type 2 diabetes mellitus with stage 3a chronic kidney disease, without long-term current use of insulin  (HCC) (Primary) Uncontrolled with A1c of 9.0 up from 6.5 previously Goal is less than 7.0 Poor control due to running out of glipizide  which I have refilled Continue Farxiga  Counseled on Diabetic diet, my plate method, 161 minutes of moderate intensity exercise/week Blood sugar logs with fasting goals of 80-120 mg/dl, random of less than 096 and in the event of sugars less than 60 mg/dl or greater than 045 mg/dl encouraged to notify the clinic. Advised on the need for annual eye exams, annual foot exams, Pneumonia vaccine. - POCT glycosylated hemoglobin (Hb A1C) - CMP14+EGFR  2. Type 2 diabetes mellitus with complication, without long-term current use of insulin  (HCC) See #1 above - glipiZIDE  (GLUCOTROL ) 5 MG tablet; Take 0.5 tablets (2.5 mg total) by mouth 2 (two) times daily before a meal.  Dispense: 90 tablet; Refill: 1  3. Diabetic polyneuropathy associated with type 2 diabetes mellitus (HCC) Stable - gabapentin  (NEURONTIN ) 300 MG capsule; Take 1 capsule (300 mg total) by mouth 2  (two) times daily.  Dispense: 180 capsule; Refill: 1 - bacitracin 500 UNIT/GM ointment; Apply 1 Application topically 2 (two) times daily.  Dispense: 15 g; Refill: 0  4. Diabetic ulcer of right great toe (HCC) Placed on bacitracin Advised to monitor also as no oral antibiotic is indicated at this time If worsening will refer to wound care Advised to use a Mirror to view foot  5. Osteoarthritis of right knee, unspecified osteoarthritis type Improved but with intermittent flares Would love to refer to orthopedics for cortisone injection but he declines Will place on Robaxin   and topical NSAID Oral NSAID contraindicated due to increased risk of bleeding when combined with Xarelto  - methocarbamol  (ROBAXIN ) 500 MG tablet; Take 1 tablet (500 mg total) by mouth every 8 (eight) hours as needed for muscle spasms.  Dispense: 60 tablet; Refill: 1 - diclofenac  Sodium (VOLTAREN ) 1 % GEL; Apply 4 g topically 4 (four) times daily.  Dispense: 100 g; Refill: 1  6. Paroxysmal atrial fibrillation (HCC) Currently in sinus rhythm Continue anticoagulation with Xarelto  and rate control  7. Peripheral arterial disease (HCC) Risk factor modification Asymptomatic Continue statin and Xarelto   8. Hypertensive heart disease with chronic combined systolic and diastolic congestive heart failure (HCC) EF of 65 to 70%, presence of pulmonary hypertension Euvolemic Continue cardiac medications per cardiology.   Meds ordered this encounter  Medications   glipiZIDE  (GLUCOTROL ) 5 MG tablet    Sig: Take 0.5 tablets (2.5 mg total) by mouth 2 (two) times daily before a meal.    Dispense:  90 tablet    Refill:  1   gabapentin  (NEURONTIN ) 300 MG capsule    Sig: Take 1 capsule (300 mg total) by mouth 2 (two) times daily.    Dispense:  180 capsule    Refill:  1   methocarbamol  (ROBAXIN ) 500 MG tablet    Sig: Take 1 tablet (500 mg total) by mouth every 8 (eight) hours as needed for muscle spasms.    Dispense:  60  tablet    Refill:  1   bacitracin 500 UNIT/GM ointment    Sig: Apply 1 Application topically 2 (two) times daily.    Dispense:  15 g    Refill:  0   diclofenac  Sodium (VOLTAREN ) 1 % GEL    Sig: Apply 4 g topically 4 (four) times daily.    Dispense:  100 g    Refill:  1    Follow-up: Return in about 3 months (around 05/28/2024) for Chronic medical conditions.       Joaquin Mulberry, MD, FAAFP. [facility] [place], [place] [phone]   02/26/2024, 4:46 PM

## 2024-02-27 ENCOUNTER — Ambulatory Visit: Payer: Self-pay | Admitting: Family Medicine

## 2024-02-27 LAB — CMP14+EGFR
ALT: 11 IU/L (ref 0–44)
AST: 13 IU/L (ref 0–40)
Albumin: 4.4 g/dL (ref 3.9–4.9)
Alkaline Phosphatase: 137 IU/L — ABNORMAL HIGH (ref 44–121)
BUN/Creatinine Ratio: 19 (ref 10–24)
BUN: 30 mg/dL — ABNORMAL HIGH (ref 8–27)
Bilirubin Total: 0.9 mg/dL (ref 0.0–1.2)
CO2: 21 mmol/L (ref 20–29)
Calcium: 9.5 mg/dL (ref 8.6–10.2)
Chloride: 96 mmol/L (ref 96–106)
Creatinine, Ser: 1.57 mg/dL — ABNORMAL HIGH (ref 0.76–1.27)
Globulin, Total: 3.1 g/dL (ref 1.5–4.5)
Glucose: 159 mg/dL — ABNORMAL HIGH (ref 70–99)
Potassium: 4.2 mmol/L (ref 3.5–5.2)
Sodium: 137 mmol/L (ref 134–144)
Total Protein: 7.5 g/dL (ref 6.0–8.5)
eGFR: 48 mL/min/{1.73_m2} — ABNORMAL LOW (ref >59)

## 2024-04-30 ENCOUNTER — Other Ambulatory Visit: Payer: Self-pay

## 2024-04-30 ENCOUNTER — Other Ambulatory Visit: Payer: Self-pay | Admitting: Family Medicine

## 2024-04-30 MED ORDER — GLUCOSE BLOOD VI STRP
ORAL_STRIP | 3 refills | Status: AC
  Filled 2024-04-30 – 2024-05-28 (×2): qty 100, 90d supply, fill #0

## 2024-05-01 ENCOUNTER — Other Ambulatory Visit: Payer: Self-pay

## 2024-05-27 ENCOUNTER — Other Ambulatory Visit: Payer: Self-pay

## 2024-05-27 ENCOUNTER — Other Ambulatory Visit: Payer: Self-pay | Admitting: Family Medicine

## 2024-05-28 ENCOUNTER — Encounter: Payer: Self-pay | Admitting: Family Medicine

## 2024-05-28 ENCOUNTER — Ambulatory Visit: Attending: Family Medicine | Admitting: Family Medicine

## 2024-05-28 ENCOUNTER — Other Ambulatory Visit: Payer: Self-pay

## 2024-05-28 VITALS — BP 129/76 | HR 62 | Ht 73.0 in | Wt 250.0 lb

## 2024-05-28 DIAGNOSIS — N1831 Chronic kidney disease, stage 3a: Secondary | ICD-10-CM | POA: Diagnosis not present

## 2024-05-28 DIAGNOSIS — I152 Hypertension secondary to endocrine disorders: Secondary | ICD-10-CM | POA: Diagnosis present

## 2024-05-28 DIAGNOSIS — E11621 Type 2 diabetes mellitus with foot ulcer: Secondary | ICD-10-CM | POA: Diagnosis not present

## 2024-05-28 DIAGNOSIS — I130 Hypertensive heart and chronic kidney disease with heart failure and stage 1 through stage 4 chronic kidney disease, or unspecified chronic kidney disease: Secondary | ICD-10-CM | POA: Diagnosis not present

## 2024-05-28 DIAGNOSIS — E1159 Type 2 diabetes mellitus with other circulatory complications: Secondary | ICD-10-CM | POA: Diagnosis present

## 2024-05-28 DIAGNOSIS — I509 Heart failure, unspecified: Secondary | ICD-10-CM | POA: Insufficient documentation

## 2024-05-28 DIAGNOSIS — E1142 Type 2 diabetes mellitus with diabetic polyneuropathy: Secondary | ICD-10-CM | POA: Diagnosis not present

## 2024-05-28 DIAGNOSIS — Z79899 Other long term (current) drug therapy: Secondary | ICD-10-CM | POA: Insufficient documentation

## 2024-05-28 DIAGNOSIS — Z7901 Long term (current) use of anticoagulants: Secondary | ICD-10-CM | POA: Insufficient documentation

## 2024-05-28 DIAGNOSIS — I272 Pulmonary hypertension, unspecified: Secondary | ICD-10-CM | POA: Insufficient documentation

## 2024-05-28 DIAGNOSIS — E1151 Type 2 diabetes mellitus with diabetic peripheral angiopathy without gangrene: Secondary | ICD-10-CM | POA: Insufficient documentation

## 2024-05-28 DIAGNOSIS — Z7984 Long term (current) use of oral hypoglycemic drugs: Secondary | ICD-10-CM | POA: Insufficient documentation

## 2024-05-28 DIAGNOSIS — I480 Paroxysmal atrial fibrillation: Secondary | ICD-10-CM | POA: Insufficient documentation

## 2024-05-28 DIAGNOSIS — L97519 Non-pressure chronic ulcer of other part of right foot with unspecified severity: Secondary | ICD-10-CM | POA: Insufficient documentation

## 2024-05-28 DIAGNOSIS — E1122 Type 2 diabetes mellitus with diabetic chronic kidney disease: Secondary | ICD-10-CM | POA: Diagnosis not present

## 2024-05-28 DIAGNOSIS — M1711 Unilateral primary osteoarthritis, right knee: Secondary | ICD-10-CM | POA: Diagnosis not present

## 2024-05-28 DIAGNOSIS — K7460 Unspecified cirrhosis of liver: Secondary | ICD-10-CM | POA: Insufficient documentation

## 2024-05-28 DIAGNOSIS — Z8774 Personal history of (corrected) congenital malformations of heart and circulatory system: Secondary | ICD-10-CM | POA: Insufficient documentation

## 2024-05-28 DIAGNOSIS — I739 Peripheral vascular disease, unspecified: Secondary | ICD-10-CM

## 2024-05-28 LAB — POCT GLYCOSYLATED HEMOGLOBIN (HGB A1C): HbA1c, POC (controlled diabetic range): 6.5 % (ref 0.0–7.0)

## 2024-05-28 MED ORDER — ACCU-CHEK GUIDE ME W/DEVICE KIT
PACK | 0 refills | Status: AC
  Filled 2024-05-28: qty 1, 30d supply, fill #0

## 2024-05-28 NOTE — Patient Instructions (Signed)
 VISIT SUMMARY:  Today, we reviewed your diabetes management, the wound on your right big toe, and your medication regimen. We also discussed your peripheral arterial disease, atrial fibrillation, and knee pain.  YOUR PLAN:  -TYPE 2 DIABETES MELLITUS WITH STAGE 3A CHRONIC KIDNEY DISEASE, DIABETIC POLYNEUROPATHY, AND DIABETIC ULCER OF RIGHT GREAT TOE: Your diabetes is improving, as shown by your A1c dropping from 9.0 to 6.5. However, the sore on your right big toe is not healing as expected. Diabetes can cause wounds to heal slowly. You should resume taking glipizide  2.5 mg twice daily as prescribed. We will refer you to a wound care specialist to help manage the ulcer on your toe.  -PERIPHERAL ARTERIAL DISEASE: Peripheral arterial disease affects blood flow to your limbs, which can complicate wound healing. We will monitor your circulation, especially concerning the ulcer on your right big toe.  -PAROXYSMAL ATRIAL FIBRILLATION: Atrial fibrillation is an irregular and often rapid heart rate. You should continue taking rivaroxaban  (Xarelto ) 20 mg once daily with supper to prevent blood clots.  -OSTEOARTHRITIS OF RIGHT KNEE: Osteoarthritis is a condition that causes joint pain and stiffness. Your right knee pain has improved with diclofenac  gel. Continue using diclofenac  sodium (Voltaren ) 1% gel as needed for knee pain.  INSTRUCTIONS:  Please follow up with the wound care specialist for your right big toe ulcer. Continue taking your medications as prescribed and monitor your circulation. Your next health check is scheduled for next month.

## 2024-05-28 NOTE — Progress Notes (Signed)
 Subjective:  Patient ID: Paul Mclaughlin, male    DOB: April 22, 1956  Age: 68 y.o. MRN: [id number]  CC: Medical Management of Chronic Issues     Discussed the use of AI scribe software for clinical note transcription with the patient, who gave verbal consent to proceed.  History of Present Illness Paul Mclaughlin is a 68 year old male with a history of type 2 diabetes mellitus, hypertension, atrial fibrillation (on anticoagulation with Xarelto  and rate control with metoprolol ), history of Congenital heart disease status post surgery, Pulmonary hypertension, congestive heart failure ( EF of 65-70% from 2-D echo 07/2022, mild LVH,  RV function mildly reduced, mildly elevated PA systolic pressure, moderately dilated LA, IVC dilatation), peripheral arterial disease , liver cirrhosis arthritis  who presents for follow-up of his right big toe wound and medication management.  His A1c has improved from 9.0 to 6.5. He ran out of glipizide  and had been taking half a pill daily rather than twice a day. He has renewed his prescription and plans to continue this dosing.  A sore on his right big toe has been present for three months, with a 'hunk of skin' coming off like a scab, leaving the area tender and occasionally bleeding. His feet take a long time to heal due to daily activity he states. He has peripheral arterial disease. He continues gabapentin  for neuropathy   He is on Xarelto  for atrial fibrillation without recent exacerbations. He has ordered refills for Farxiga , Xarelto , and spironolactone  which he endorses adherence with however he has not been to see cardiology lately.  From a cardiac standpoint he has not experienced any CHF exacerbations with no chest pain, pedal edema or dyspnea.    Past Medical History:  Diagnosis Date   Atrial fibrillation (HCC)    Cellulitis and abscess of left leg 06/2016   CHF (congestive heart failure) (HCC)    Diabetes (HCC)    Dyspnea     Past Surgical  History:  Procedure Laterality Date   AMPUTATION Left 06/24/2016   Procedure: FOOT FIFTH RAY TOE AMPUTATION;  Surgeon: Jerona LULLA Sage, MD;  Location: [facility] OR;  Service: Orthopedics;  Laterality: Left;   IR PARACENTESIS  11/12/2020   IR PARACENTESIS  11/13/2020   IR PARACENTESIS  11/16/2020   open heart surgery     As a child.  [age] years old.  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    RIGHT HEART CATH N/A 02/17/2021   Procedure: RIGHT HEART CATH;  Surgeon: Rolan Ezra RAMAN, MD;  Location: [facility] INVASIVE CV LAB;  Service: Cardiovascular;  Laterality: N/A;   RIGHT/LEFT HEART CATH AND CORONARY ANGIOGRAPHY N/A 03/30/2017   Procedure: Right/Left Heart Cath and Coronary Angiography;  Surgeon: Rolan Ezra RAMAN, MD;  Location: [facility] INVASIVE CV LAB;  Service: Cardiovascular;  Laterality: N/A;    Family History  Problem Relation Age of Onset   Diabetes Mellitus II Mother        ESRD   Diabetes Mellitus II Brother    Emphysema Father     Social History   Socioeconomic History   Marital status: Divorced    Spouse name: Not on file   Number of children: 1   Years of education: Not on file   Highest education level: Not on file  Occupational History   Not on file  Tobacco Use   Smoking status: Never   Smokeless tobacco: Never  Vaping Use   Vaping status: Never Used  Substance and Sexual Activity   Alcohol use: No  Comment: gave up drinking ~20 years ago   Drug use: Yes    Types: Marijuana    Comment: daily   Sexual activity: Not on file  Other Topics Concern   Not on file  Social History Narrative   Lives alone    Social Drivers of Health   Financial Resource Strain: Low Risk  (10/12/2022)   Overall Financial Resource Strain (CARDIA)    Difficulty of Paying Living Expenses: Not hard at all  Food Insecurity: No Food Insecurity (10/12/2022)   Hunger Vital Sign    Worried About Running Out of Food in the Last Year: Never true    Ran Out of Food in the Last Year: Never true  Transportation Needs: No Transportation  Needs (10/12/2022)   PRAPARE - Administrator, Civil Service (Medical): No    Lack of Transportation (Non-Medical): No  Physical Activity: Insufficiently Active (10/12/2022)   Exercise Vital Sign    Days of Exercise per Week: 2 days    Minutes of Exercise per Session: 20 min  Stress: No Stress Concern Present (10/12/2022)   [employer] of Occupational Health - Occupational Stress Questionnaire    Feeling of Stress : Not at all  Social Connections: Socially Isolated (10/12/2022)   Social Connection and Isolation Panel    Frequency of Communication with Friends and Family: More than three times a week    Frequency of Social Gatherings with Friends and Family: Three times a week    Attends Religious Services: Never    Active Member of Clubs or Organizations: No    Attends Banker Meetings: Never    Marital Status: Divorced    Allergies  Allergen Reactions   Tadalafil  Other (See Comments)    Patient experiences a head ache with this medication.    Outpatient Medications Prior to Visit  Medication Sig Dispense Refill   albuterol  (PROVENTIL  HFA;VENTOLIN  HFA) 108 (90 Base) MCG/ACT inhaler Inhale 2 puffs into the lungs every 6 (six) hours as needed for wheezing or shortness of breath. 1 Inhaler 0   amLODipine  (NORVASC ) 10 MG tablet Take 1 tablet (10 mg total) by mouth daily. 90 tablet 3   atorvastatin  (LIPITOR) 20 MG tablet Take 1 tablet (20 mg total) by mouth daily. 90 tablet 3   bacitracin  500 UNIT/GM ointment Apply 1 Application topically 2 (two) times daily. 15 g 0   Blood Glucose Monitoring Suppl (ACCU-CHEK GUIDE ME) w/Device KIT USE TO CHECK BLOOD SUGAR DAILY. 1 kit 0   dapagliflozin  propanediol (FARXIGA ) 10 MG TABS tablet Take 1 tablet (10 mg total) by mouth daily before breakfast. 90 tablet 3   diclofenac  Sodium (VOLTAREN ) 1 % GEL Apply 4 g topically 4 (four) times daily. 100 g 1   gabapentin  (NEURONTIN ) 300 MG capsule Take 1 capsule (300 mg total) by  mouth 2 (two) times daily. 180 capsule 1   glipiZIDE  (GLUCOTROL ) 5 MG tablet Take 0.5 tablets (2.5 mg total) by mouth 2 (two) times daily before a meal. 90 tablet 1   glucose blood test strip USE TO CHECK BLOOD SUGAR DAILY. 100 strip 3   methocarbamol  (ROBAXIN ) 500 MG tablet Take 1 tablet (500 mg total) by mouth every 8 (eight) hours as needed for muscle spasms. 60 tablet 1   Misc. Devices MISC Blood pressure monitor.  Diagnosis- hypertension 1 each 0   rivaroxaban  (XARELTO ) 20 MG TABS tablet Take 1 tablet (20 mg total) by mouth once daily with supper. (Needs PCP office visit for  more refills). 90 tablet 3   spironolactone  (ALDACTONE ) 25 MG tablet Take 2 tablets (50 mg total) by mouth daily. 180 tablet 3   torsemide  (DEMADEX ) 20 MG tablet Take 2 tablets (40 mg total) by mouth every morning AND 1 tablet (20 mg total) every evening. 270 tablet 3   No facility-administered medications prior to visit.     ROS Review of Systems  Constitutional:  Negative for activity change and appetite change.  HENT:  Negative for sinus pressure and sore throat.   Respiratory:  Negative for chest tightness, shortness of breath and wheezing.   Cardiovascular:  Negative for chest pain and palpitations.  Gastrointestinal:  Negative for abdominal distention, abdominal pain and constipation.  Genitourinary: Negative.   Musculoskeletal: Negative.   Skin:  Positive for wound.  Psychiatric/Behavioral:  Negative for behavioral problems and dysphoric mood.     Objective:  BP 129/76   Pulse 62   Ht 6' 1 (1.854 m)   Wt 250 lb (113.4 kg)   SpO2 96%   BMI 32.98 kg/m      05/28/2024    3:57 PM 02/26/2024    4:39 PM 02/26/2024    4:00 PM  BP/Weight  Systolic BP 129 114 155  Diastolic BP 76 73 83  Wt. (Lbs) 250  239.6  BMI 32.98 kg/m2  31.61 kg/m2      Physical Exam Constitutional:      Appearance: He is well-developed.  Cardiovascular:     Rate and Rhythm: Normal rate.     Heart sounds: Normal heart  sounds. No murmur heard. Pulmonary:     Effort: Pulmonary effort is normal.     Breath sounds: Normal breath sounds. No wheezing or rales.  Chest:     Chest wall: No tenderness.  Abdominal:     General: Bowel sounds are normal. There is no distension.     Palpations: Abdomen is soft. There is no mass.     Tenderness: There is no abdominal tenderness.  Musculoskeletal:        General: Normal range of motion.     Right lower leg: No edema.     Left lower leg: No edema.  Neurological:     Mental Status: He is alert and oriented to person, place, and time.  Psychiatric:        Mood and Affect: Mood normal.        Latest Ref Rng & Units 02/26/2024    4:47 PM 10/09/2023   12:15 PM 02/22/2023    3:33 PM  CMP  Glucose 70 - 99 mg/dL 840  890  881   BUN 8 - 27 mg/dL 30  35  34   Creatinine 0.76 - 1.27 mg/dL 8.42  8.42  8.17   Sodium 134 - 144 mmol/L 137  137  139   Potassium 3.5 - 5.2 mmol/L 4.2  4.1  4.3   Chloride 96 - 106 mmol/L 96  100  99   CO2 20 - 29 mmol/L 21  24  23    Calcium  8.6 - 10.2 mg/dL 9.5  9.4  9.1   Total Protein 6.0 - 8.5 g/dL 7.5  7.5  7.9   Total Bilirubin 0.0 - 1.2 mg/dL 0.9  1.2  0.7   Alkaline Phos 44 - 121 IU/L 137  108  143   AST 0 - 40 IU/L 13  22  11    ALT 0 - 44 IU/L 11  20  12      Lipid  Panel     Component Value Date/Time   CHOL 96 10/09/2023 1215   CHOL 103 06/29/2021 1620   TRIG 49 10/09/2023 1215   HDL 40 (L) 10/09/2023 1215   HDL 46 06/29/2021 1620   CHOLHDL 2.4 10/09/2023 1215   VLDL 10 10/09/2023 1215   LDLCALC 46 10/09/2023 1215   LDLCALC 42 06/29/2021 1620    CBC    Component Value Date/Time   WBC 7.7 10/09/2023 1215   RBC 4.04 (L) 10/09/2023 1215   HGB 13.0 10/09/2023 1215   HGB 14.1 09/29/2021 1453   HCT 38.3 (L) 10/09/2023 1215   HCT 41.5 09/29/2021 1453   PLT 246 10/09/2023 1215   PLT 274 09/29/2021 1453   MCV 94.8 10/09/2023 1215   MCV 95 09/29/2021 1453   MCH 32.2 10/09/2023 1215   MCHC 33.9 10/09/2023 1215   RDW  13.9 10/09/2023 1215   RDW 12.3 09/29/2021 1453   LYMPHSABS 1.2 09/29/2021 1453   MONOABS 0.4 11/18/2020 0120   EOSABS 0.4 09/29/2021 1453   BASOSABS 0.1 09/29/2021 1453    Lab Results  Component Value Date   HGBA1C 6.5 05/28/2024   Lab Results  Component Value Date   HGBA1C 6.5 05/28/2024   HGBA1C 9.0 (A) 02/26/2024   HGBA1C 6.5 08/28/2023        Assessment & Plan Type 2 diabetes mellitus with stage 3a chronic kidney disease without use of insulin , A1c improved from 9.0 to 6.5. Diabetic ulcer on right great toe not healing as expected, requires further evaluation. - Resume glipizide  2.5 mg twice daily as prescribed.   Diabetic polyneuropathy, and diabetic ulcer of right great toe - Refer to wound care for evaluation and management of the diabetic ulcer on the right great toe.  Peripheral arterial disease Concern for circulation issues, especially with diabetic ulcer on right great toe. - Monitor circulation status, particularly in the context of the diabetic ulcer on the right great toe. - [place] on Xarelto  - Continue statin  Paroxysmal atrial fibrillation Continues rivaroxaban  for anticoagulation. No recent atrial fibrillation episodes reported.  Currently in sinus rhythm - Continue rivaroxaban  (Xarelto ) 20 mg once daily with supper for anticoagulation, beta-blocker for rate control  Osteoarthritis of right knee Right knee pain improved with diclofenac  gel, currently not significant. - Continue using diclofenac  sodium (Voltaren ) 1% gel as needed for knee pain.   Benign hypertensive heart and renal disease Euvolemic with a EF of 65 to 70%, severely enlarged right ventricular size, elevated pulmonary artery pressure He has not followed up with cardiology and has been advised to schedule an appointment.    No orders of the defined types were placed in this encounter.   Follow-up: Return in about 6 months (around 11/25/2024) for Chronic medical conditions.        Corrina Sabin, MD, FAAFP. [facility] [place], [place] [phone]   05/28/2024, 5:24 PM

## 2024-05-29 ENCOUNTER — Ambulatory Visit: Payer: Self-pay | Admitting: Family Medicine

## 2024-05-29 ENCOUNTER — Other Ambulatory Visit: Payer: Self-pay

## 2024-05-29 LAB — BASIC METABOLIC PANEL WITH GFR
BUN/Creatinine Ratio: 23 (ref 10–24)
BUN: 36 mg/dL — ABNORMAL HIGH (ref 8–27)
CO2: 22 mmol/L (ref 20–29)
Calcium: 9.3 mg/dL (ref 8.6–10.2)
Chloride: 97 mmol/L (ref 96–106)
Creatinine, Ser: 1.55 mg/dL — ABNORMAL HIGH (ref 0.76–1.27)
Glucose: 188 mg/dL — ABNORMAL HIGH (ref 70–99)
Potassium: 4.3 mmol/L (ref 3.5–5.2)
Sodium: 136 mmol/L (ref 134–144)
eGFR: 49 mL/min/1.73 — ABNORMAL LOW (ref >59)

## 2024-06-04 ENCOUNTER — Encounter (HOSPITAL_BASED_OUTPATIENT_CLINIC_OR_DEPARTMENT_OTHER): Admitting: Internal Medicine

## 2024-07-09 ENCOUNTER — Other Ambulatory Visit: Payer: Self-pay | Admitting: Orthopedic Surgery

## 2024-07-09 ENCOUNTER — Ambulatory Visit: Attending: Family Medicine

## 2024-07-09 ENCOUNTER — Other Ambulatory Visit: Payer: Self-pay

## 2024-07-09 VITALS — BP 114/63 | HR 68 | Temp 97.8°F | Resp 16 | Ht 73.0 in | Wt 244.6 lb

## 2024-07-09 DIAGNOSIS — Z0000 Encounter for general adult medical examination without abnormal findings: Secondary | ICD-10-CM | POA: Diagnosis not present

## 2024-07-09 NOTE — Patient Instructions (Signed)
 [name],  Thank you for taking the time for your Medicare Wellness Visit. I appreciate your continued commitment to your health goals. Please review the care plan we discussed, and feel free to reach out if I can assist you further.  Medicare recommends these wellness visits once per year to help you and your care team stay ahead of potential health issues. These visits are designed to focus on prevention, allowing your provider to concentrate on managing your acute and chronic conditions during your regular appointments.  Please note that Annual Wellness Visits do not include a physical exam. Some assessments may be limited, especially if the visit was conducted virtually. If needed, we may recommend a separate in-person follow-up with your provider.  Ongoing Care Seeing your primary care provider every 3 to 6 months helps us  monitor your health and provide consistent, personalized care.   Referrals If a referral was made during today's visit and you haven't received any updates within two weeks, please contact the referred provider directly to check on the status.  Recommended Screenings:  Health Maintenance  Topic Date Due   Zoster (Shingles) Vaccine (1 of 2) Never done   COVID-19 Vaccine (3 - 2025-26 season) 05/13/2024   Yearly kidney health urinalysis for diabetes  06/04/2024   Eye exam for diabetics  06/14/2024   Pneumococcal Vaccine for age over 73 (2 of 2 - PCV) 08/27/2024*   Flu Shot  12/10/2024*   Cologuard (Stool DNA test)  07/19/2024   Complete foot exam   08/27/2024   Hemoglobin A1C  11/25/2024   Yearly kidney function blood test for diabetes  05/28/2025   Medicare Annual Wellness Visit  07/09/2025   Hepatitis C Screening  Completed   Meningitis B Vaccine  Aged Out   DTaP/Tdap/Td vaccine  Discontinued  *Topic was postponed. The date shown is not the original due date.       07/09/2024    4:05 PM  Advanced Directives  Does Patient Have a Medical Advance Directive?  No  Would patient like information on creating a medical advance directive? No - Patient declined   Advance Care Planning is important because it: Ensures you receive medical care that aligns with your values, goals, and preferences. Provides guidance to your family and loved ones, reducing the emotional burden of decision-making during critical moments.  Vision: Annual vision screenings are recommended for early detection of glaucoma, cataracts, and diabetic retinopathy. These exams can also reveal signs of chronic conditions such as diabetes and high blood pressure.  Dental: Annual dental screenings help detect early signs of oral cancer, gum disease, and other conditions linked to overall health, including heart disease and diabetes.  Please see the attached documents for additional preventive care recommendations.

## 2024-07-09 NOTE — Progress Notes (Cosign Needed Addendum)
 Subjective:   Paul Mclaughlin is a 68 y.o. who presents for a Medicare Wellness preventive visit.  As a reminder, Annual Wellness Visits don't include a physical exam, and some assessments may be limited, especially if this visit is performed virtually. We may recommend an in-person follow-up visit with your provider if needed.  Visit Complete: In person  Persons Participating in Visit: Patient.  AWV Questionnaire: No: Patient Medicare AWV questionnaire was not completed prior to this visit.  Cardiac Risk Factors include: advanced age (>23men, >71 women);diabetes mellitus;male gender;obesity (BMI >30kg/m2);sedentary lifestyle     Objective:    Today's Vitals   07/09/24 1600 07/09/24 1603  BP: 114/63   Pulse: 68   Resp: 16   Temp: 97.8 F (36.6 C)   TempSrc: Oral   SpO2: 97%   Weight: 244 lb 9.6 oz (110.9 kg)   Height: 6' 1 (1.854 m)   PainSc: 4  4   PainLoc: Back    Body mass index is 32.27 kg/m.     07/09/2024    4:05 PM 10/12/2022    3:32 PM 02/17/2021   11:08 AM 11/15/2020    8:00 AM 11/11/2020   11:00 AM 11/10/2020    5:22 PM 07/12/2017   12:28 PM  Advanced Directives  Does Patient Have a Medical Advance Directive? No No No No No No No   Would patient like information on creating a medical advance directive? No - Patient declined  No - Patient declined No - Patient declined No - Patient declined No - Patient declined No - Patient declined      Data saved with a previous flowsheet row definition    Current Medications (verified) Outpatient Encounter Medications as of 07/09/2024  Medication Sig   albuterol  (PROVENTIL  HFA;VENTOLIN  HFA) 108 (90 Base) MCG/ACT inhaler Inhale 2 puffs into the lungs every 6 (six) hours as needed for wheezing or shortness of breath.   amLODipine  (NORVASC ) 10 MG tablet Take 1 tablet (10 mg total) by mouth daily.   atorvastatin  (LIPITOR) 20 MG tablet Take 1 tablet (20 mg total) by mouth daily.   bacitracin  500 UNIT/GM ointment Apply 1  Application topically 2 (two) times daily.   Blood Glucose Monitoring Suppl (ACCU-CHEK GUIDE ME) w/Device KIT USE TO CHECK BLOOD SUGAR DAILY.   dapagliflozin  propanediol (FARXIGA ) 10 MG TABS tablet Take 1 tablet (10 mg total) by mouth daily before breakfast.   diclofenac  Sodium (VOLTAREN ) 1 % GEL Apply 4 g topically 4 (four) times daily.   gabapentin  (NEURONTIN ) 300 MG capsule Take 1 capsule (300 mg total) by mouth 2 (two) times daily.   glipiZIDE  (GLUCOTROL ) 5 MG tablet Take 0.5 tablets (2.5 mg total) by mouth 2 (two) times daily before a meal.   glucose blood test strip USE TO CHECK BLOOD SUGAR DAILY.   methocarbamol  (ROBAXIN ) 500 MG tablet Take 1 tablet (500 mg total) by mouth every 8 (eight) hours as needed for muscle spasms.   Misc. Devices MISC Blood pressure monitor.  Diagnosis- hypertension   rivaroxaban  (XARELTO ) 20 MG TABS tablet Take 1 tablet (20 mg total) by mouth once daily with supper. (Needs PCP office visit for more refills).   spironolactone  (ALDACTONE ) 25 MG tablet Take 2 tablets (50 mg total) by mouth daily.   torsemide  (DEMADEX ) 20 MG tablet Take 2 tablets (40 mg total) by mouth every morning AND 1 tablet (20 mg total) every evening.   [DISCONTINUED] hydrALAZINE  (APRESOLINE ) 100 MG tablet TAKE 1 TABLET (100 MG TOTAL) BY  MOUTH 3 (THREE) TIMES DAILY.   [DISCONTINUED] lisinopril  (ZESTRIL ) 2.5 MG tablet Take 1 tablet (2.5 mg total) by mouth daily.   [DISCONTINUED] metoprolol  tartrate (LOPRESSOR ) 50 MG tablet Take 1 tablet (50 mg total) by mouth 2 (two) times daily.   No facility-administered encounter medications on file as of 07/09/2024.    Allergies (verified) Tadalafil    History: Past Medical History:  Diagnosis Date   Atrial fibrillation (HCC)    Cellulitis and abscess of left leg 06/2016   CHF (congestive heart failure) (HCC)    Diabetes (HCC)    Dyspnea    Past Surgical History:  Procedure Laterality Date   AMPUTATION Left 06/24/2016   Procedure: FOOT FIFTH RAY  TOE AMPUTATION;  Surgeon: Jerona LULLA Sage, MD;  Location: [facility] OR;  Service: Orthopedics;  Laterality: Left;   IR PARACENTESIS  11/12/2020   IR PARACENTESIS  11/13/2020   IR PARACENTESIS  11/16/2020   open heart surgery     As a child.  [age] years old.  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    RIGHT HEART CATH N/A 02/17/2021   Procedure: RIGHT HEART CATH;  Surgeon: Rolan Ezra RAMAN, MD;  Location: [facility] INVASIVE CV LAB;  Service: Cardiovascular;  Laterality: N/A;   RIGHT/LEFT HEART CATH AND CORONARY ANGIOGRAPHY N/A 03/30/2017   Procedure: Right/Left Heart Cath and Coronary Angiography;  Surgeon: Rolan Ezra RAMAN, MD;  Location: [facility] INVASIVE CV LAB;  Service: Cardiovascular;  Laterality: N/A;   Family History  Problem Relation Age of Onset   Diabetes Mellitus II Mother        ESRD   Diabetes Mellitus II Brother    Emphysema Father    Social History   Socioeconomic History   Marital status: Divorced    Spouse name: Not on file   Number of children: 1   Years of education: Not on file   Highest education level: Not on file  Occupational History   Not on file  Tobacco Use   Smoking status: Never   Smokeless tobacco: Never  Vaping Use   Vaping status: Never Used  Substance and Sexual Activity   Alcohol use: No    Comment: gave up drinking ~20 years ago   Drug use: Yes    Types: Marijuana    Comment: daily   Sexual activity: Not on file  Other Topics Concern   Not on file  Social History Narrative   Lives alone    Social Drivers of Health   Financial Resource Strain: Low Risk  (07/09/2024)   Overall Financial Resource Strain (CARDIA)    Difficulty of Paying Living Expenses: Not hard at all  Food Insecurity: No Food Insecurity (07/09/2024)   Hunger Vital Sign    Worried About Running Out of Food in the Last Year: Never true    Ran Out of Food in the Last Year: Never true  Transportation Needs: No Transportation Needs (07/09/2024)   PRAPARE - Administrator, Civil Service (Medical): No    Lack of  Transportation (Non-Medical): No  Physical Activity: Inactive (07/09/2024)   Exercise Vital Sign    Days of Exercise per Week: 0 days    Minutes of Exercise per Session: 0 min  Stress: No Stress Concern Present (07/09/2024)   [employer] of Occupational Health - Occupational Stress Questionnaire    Feeling of Stress: Not at all  Social Connections: Socially Isolated (07/09/2024)   Social Connection and Isolation Panel    Frequency of Communication with Friends and Family: More than  three times a week    Frequency of Social Gatherings with Friends and Family: Three times a week    Attends Religious Services: Never    Active Member of Clubs or Organizations: No    Attends Banker Meetings: Never    Marital Status: Divorced    Tobacco Counseling Counseling given: Not Answered    Clinical Intake:  Pre-visit preparation completed: Yes  Pain : 0-10 Pain Score: 4  Pain Type: Chronic pain     BMI - recorded: 32.27 Nutritional Status: BMI > 30  Obese Nutritional Risks: None Diabetes: Yes CBG done?: No Did pt. bring in CBG monitor from home?: No  Lab Results  Component Value Date   HGBA1C 6.5 05/28/2024   HGBA1C 9.0 (A) 02/26/2024   HGBA1C 6.5 08/28/2023     How often do you need to have someone help you when you read instructions, pamphlets, or other written materials from your doctor or pharmacy?: 1 - Never  Interpreter Needed?: No  Information entered by :: Roz Fuller, LPN.   Activities of Daily Living     07/09/2024    4:09 PM  In your present state of health, do you have any difficulty performing the following activities:  Hearing? 0  Vision? 0  Difficulty concentrating or making decisions? 0  Walking or climbing stairs? 0  Dressing or bathing? 0  Doing errands, shopping? 0  Preparing Food and eating ? N  Using the Toilet? N  In the past six months, have you accidently leaked urine? N  Do you have problems with loss of bowel  control? N  Managing your Medications? N  Managing your Finances? N  Housekeeping or managing your Housekeeping? N    Patient Care Team: Delbert Clam, MD as PCP - General (Family Medicine) Octavia Charleston, MD as Referring Physician (Ophthalmology)  I have updated your Care Teams any recent Medical Services you may have received from other providers in the past year.     Assessment:   This is a routine wellness examination for Paul Mclaughlin.  Hearing/Vision screen Hearing Screening - Comments:: Denies hearing difficulties.  Vision Screening - Comments:: Wears rx glasses - up to date with routine eye exams Dr. Charleston Octavia.    Goals Addressed             This Visit's Progress    10/28/20205: TO HAVE NO KNEE PAIN.       Exercise 150 min/wk Moderate Activity         Depression Screen     07/09/2024    4:06 PM 05/28/2024    3:58 PM 02/26/2024    4:02 PM 02/22/2023    2:27 PM 10/12/2022    3:32 PM 05/17/2022    3:00 PM 11/24/2020   10:57 AM  PHQ 2/9 Scores  PHQ - 2 Score 1 1   0 0   PHQ- 9 Score 4 5    0   Exception Documentation   Patient refusal Patient refusal   Patient refusal    Fall Risk     05/28/2024    3:58 PM 02/26/2024    4:02 PM 08/28/2023    2:13 PM 02/22/2023    2:27 PM 10/12/2022    3:32 PM  Fall Risk   Falls in the past year? 0 0 1 0 0  Number falls in past yr: 0 0 0 0 0  Injury with Fall? 0 0 0 0 0  Risk for fall due to : No Fall  Risks No Fall Risks Other (Comment) No Fall Risks   Follow up Falls evaluation completed Falls evaluation completed Falls evaluation completed      MEDICARE RISK AT HOME:  Medicare Risk at Home Any stairs in or around the home?: Yes If so, are there any without handrails?: No Home free of loose throw rugs in walkways, pet beds, electrical cords, etc?: Yes Adequate lighting in your home to reduce risk of falls?: Yes Life alert?: No Use of a cane, walker or w/c?: No Grab bars in the bathroom?: No Shower chair or bench in  shower?: No Elevated toilet seat or a handicapped toilet?: No  TIMED UP AND GO:  Was the test performed?  No  Cognitive Function: 6CIT completed    07/09/2024    4:10 PM 10/12/2022    3:33 PM  MMSE - Mini Mental State Exam  Not completed: Unable to complete   Orientation to time  5  Orientation to Place  5  Registration  3  Attention/ Calculation  5  Recall  3  Language- name 2 objects  2  Language- repeat  1  Language- follow 3 step command  3  Language- read & follow direction  1  Write a sentence  1  Copy design  1  Total score  30        07/09/2024    4:10 PM 10/12/2022    3:33 PM  6CIT Screen  What Year? 0 points 0 points  What month? 0 points 0 points  What time? 0 points 0 points  Count back from 20 0 points 0 points  Months in reverse 0 points 0 points  Repeat phrase 0 points 0 points  Total Score 0 points 0 points    Immunizations Immunization History  Administered Date(s) Administered   PFIZER(Purple Top)SARS-COV-2 Vaccination 01/02/2020, 01/27/2020   Pneumococcal Polysaccharide-23 02/08/2017    Screening Tests Health Maintenance  Topic Date Due   Zoster Vaccines- Shingrix (1 of 2) Never done   COVID-19 Vaccine (3 - 2025-26 season) 05/13/2024   Diabetic kidney evaluation - Urine ACR  06/04/2024   OPHTHALMOLOGY EXAM  06/14/2024   Pneumococcal Vaccine: 50+ Years (2 of 2 - PCV) 08/27/2024 (Originally 02/08/2018)   Influenza Vaccine  12/10/2024 (Originally 04/12/2024)   Fecal DNA (Cologuard)  07/19/2024   FOOT EXAM  08/27/2024   HEMOGLOBIN A1C  11/25/2024   Diabetic kidney evaluation - eGFR measurement  05/28/2025   Medicare Annual Wellness (AWV)  07/09/2025   Hepatitis C Screening  Completed   Meningococcal B Vaccine  Aged Out   DTaP/Tdap/Td  Discontinued    Health Maintenance Items Addressed:Yes Vaccines Due: Covid-Flu, Shingrix (Patient declined during visit)  Additional Screening:  Vision Screening: Recommended annual ophthalmology exams  for early detection of glaucoma and other disorders of the eye. Is the patient up to date with their annual eye exam?  No  Who is the provider or what is the name of the office in which the patient attends annual eye exams? Ro  ophelia Gaudy, MD.  Dental Screening: Recommended annual dental exams for proper oral hygiene  Community Resource Referral / Chronic Care Management: CRR required this visit?  No   CCM required this visit?  No   Plan:    I have personally reviewed and noted the following in the patient's chart:   Medical and social history Use of alcohol, tobacco or illicit drugs  Current medications and supplements including opioid prescriptions. Patient is not currently taking opioid  prescriptions. Functional ability and status Nutritional status Physical activity Advanced directives List of other physicians Hospitalizations, surgeries, and ER visits in previous 12 months Vitals Screenings to include cognitive, depression, and falls Referrals and appointments  In addition, I have reviewed and discussed with patient certain preventive protocols, quality metrics, and best practice recommendations. A written personalized care plan for preventive services as well as general preventive health recommendations were provided to patient.   Roz LOISE Fuller, LPN   [id number]   After Visit Summary: (In Person-Declined) Patient declined AVS at this time.  Notes: Nothing significant to report at this time.

## 2024-07-10 ENCOUNTER — Other Ambulatory Visit: Payer: Self-pay

## 2024-07-10 MED ORDER — PREDNISONE 10 MG PO TABS
10.0000 mg | ORAL_TABLET | Freq: Every day | ORAL | 0 refills | Status: AC
  Filled 2024-07-10: qty 30, 30d supply, fill #0

## 2024-07-17 ENCOUNTER — Encounter (HOSPITAL_COMMUNITY): Admitting: Cardiology

## 2024-07-19 ENCOUNTER — Other Ambulatory Visit: Payer: Self-pay

## 2024-09-24 ENCOUNTER — Ambulatory Visit

## 2024-11-25 ENCOUNTER — Ambulatory Visit: Admitting: Family Medicine
